# Patient Record
Sex: Female | Born: 1945 | Race: White | Hispanic: No | Smoking: Never smoker
Health system: Southern US, Community
[De-identification: ages and names within clinical notes are randomized; demographics above are authoritative.]

## PROBLEM LIST (undated history)

## (undated) DIAGNOSIS — I776 Arteritis, unspecified: Secondary | ICD-10-CM

## (undated) DIAGNOSIS — I10 Essential (primary) hypertension: Secondary | ICD-10-CM

## (undated) DIAGNOSIS — E079 Disorder of thyroid, unspecified: Secondary | ICD-10-CM

## (undated) DIAGNOSIS — I509 Heart failure, unspecified: Secondary | ICD-10-CM

## (undated) HISTORY — PX: TUBAL LIGATION: SHX77

## (undated) HISTORY — PX: MELANOMA EXCISION: SHX5266

---

## 1997-10-11 ENCOUNTER — Other Ambulatory Visit: Admission: RE | Admit: 1997-10-11 | Discharge: 1997-10-11 | Payer: Self-pay | Admitting: *Deleted

## 1997-11-19 ENCOUNTER — Other Ambulatory Visit: Admission: RE | Admit: 1997-11-19 | Discharge: 1997-11-19 | Payer: Self-pay | Admitting: Obstetrics & Gynecology

## 1998-01-16 ENCOUNTER — Emergency Department (HOSPITAL_COMMUNITY): Admission: EM | Admit: 1998-01-16 | Discharge: 1998-01-16 | Payer: Self-pay | Admitting: Emergency Medicine

## 1998-08-28 ENCOUNTER — Ambulatory Visit (HOSPITAL_COMMUNITY): Admission: RE | Admit: 1998-08-28 | Discharge: 1998-08-28 | Payer: Self-pay | Admitting: Oncology

## 1999-06-10 ENCOUNTER — Encounter (INDEPENDENT_AMBULATORY_CARE_PROVIDER_SITE_OTHER): Payer: Self-pay

## 1999-06-10 ENCOUNTER — Other Ambulatory Visit: Admission: RE | Admit: 1999-06-10 | Discharge: 1999-06-10 | Payer: Self-pay | Admitting: Obstetrics and Gynecology

## 1999-07-09 ENCOUNTER — Encounter: Payer: Self-pay | Admitting: Ophthalmology

## 1999-07-13 ENCOUNTER — Ambulatory Visit (HOSPITAL_COMMUNITY): Admission: RE | Admit: 1999-07-13 | Discharge: 1999-07-13 | Payer: Self-pay | Admitting: Ophthalmology

## 1999-08-28 ENCOUNTER — Ambulatory Visit (HOSPITAL_COMMUNITY): Admission: RE | Admit: 1999-08-28 | Discharge: 1999-08-28 | Payer: Self-pay | Admitting: Ophthalmology

## 2000-04-14 ENCOUNTER — Emergency Department (HOSPITAL_COMMUNITY): Admission: EM | Admit: 2000-04-14 | Discharge: 2000-04-14 | Payer: Self-pay | Admitting: Emergency Medicine

## 2000-05-13 ENCOUNTER — Encounter (INDEPENDENT_AMBULATORY_CARE_PROVIDER_SITE_OTHER): Payer: Self-pay

## 2000-05-13 ENCOUNTER — Other Ambulatory Visit: Admission: RE | Admit: 2000-05-13 | Discharge: 2000-05-13 | Payer: Self-pay | Admitting: Obstetrics and Gynecology

## 2001-06-21 ENCOUNTER — Other Ambulatory Visit: Admission: RE | Admit: 2001-06-21 | Discharge: 2001-06-21 | Payer: Self-pay | Admitting: Obstetrics and Gynecology

## 2002-04-04 ENCOUNTER — Encounter (INDEPENDENT_AMBULATORY_CARE_PROVIDER_SITE_OTHER): Payer: Self-pay | Admitting: *Deleted

## 2002-04-04 ENCOUNTER — Ambulatory Visit (HOSPITAL_COMMUNITY): Admission: RE | Admit: 2002-04-04 | Discharge: 2002-04-04 | Payer: Self-pay | Admitting: *Deleted

## 2002-09-27 ENCOUNTER — Other Ambulatory Visit: Admission: RE | Admit: 2002-09-27 | Discharge: 2002-09-27 | Payer: Self-pay | Admitting: Obstetrics and Gynecology

## 2004-12-24 ENCOUNTER — Other Ambulatory Visit: Admission: RE | Admit: 2004-12-24 | Discharge: 2004-12-24 | Payer: Self-pay | Admitting: Obstetrics and Gynecology

## 2007-01-02 ENCOUNTER — Ambulatory Visit: Payer: Self-pay | Admitting: Oncology

## 2007-01-19 LAB — CBC WITH DIFFERENTIAL/PLATELET
Basophils Absolute: 0 10*3/uL (ref 0.0–0.1)
Eosinophils Absolute: 0 10*3/uL (ref 0.0–0.5)
HGB: 12.4 g/dL (ref 11.6–15.9)
MCV: 82.7 fL (ref 81.0–101.0)
MONO#: 0.5 10*3/uL (ref 0.1–0.9)
MONO%: 7.8 % (ref 0.0–13.0)
NEUT#: 4.4 10*3/uL (ref 1.5–6.5)
RBC: 4.34 10*6/uL (ref 3.70–5.32)
RDW: 13.6 % (ref 11.3–14.5)
WBC: 6 10*3/uL (ref 3.9–10.0)
lymph#: 1.1 10*3/uL (ref 0.9–3.3)

## 2007-01-19 LAB — ERYTHROCYTE SEDIMENTATION RATE: Sed Rate: 76 mm/hr — ABNORMAL HIGH (ref 0–30)

## 2007-01-23 LAB — COMPREHENSIVE METABOLIC PANEL
ALT: 25 U/L (ref 0–35)
Albumin: 4.3 g/dL (ref 3.5–5.2)
CO2: 22 mEq/L (ref 19–32)
Glucose, Bld: 142 mg/dL — ABNORMAL HIGH (ref 70–99)
Potassium: 4 mEq/L (ref 3.5–5.3)
Sodium: 141 mEq/L (ref 135–145)
Total Bilirubin: 0.8 mg/dL (ref 0.3–1.2)
Total Protein: 6.9 g/dL (ref 6.0–8.3)

## 2007-01-23 LAB — BETA 2 MICROGLOBULIN, SERUM: Beta-2 Microglobulin: 1.53 mg/L (ref 1.01–1.73)

## 2007-01-23 LAB — IMMUNOFIXATION ELECTROPHORESIS
IgM, Serum: 742 mg/dL — ABNORMAL HIGH (ref 60–263)
Total Protein, Serum Electrophoresis: 6.9 g/dL (ref 6.0–8.3)

## 2007-02-15 ENCOUNTER — Ambulatory Visit: Payer: Self-pay | Admitting: Oncology

## 2007-02-23 LAB — CREATININE CLEARANCE, URINE, 24 HOUR
Collection Interval-CRCL: 24 hours
Creatinine Clearance: 106 mL/min (ref 75–115)
Creatinine: 0.72 mg/dL (ref 0.40–1.20)
Urine Total Volume-CRCL: 1900 mL

## 2007-02-23 LAB — UIFE/LIGHT CHAINS/TP QN, 24-HR UR
Albumin, U: DETECTED
Free Lt Chn Excr Rate: 12.92 mg/d
Total Protein, Urine-Ur/day: 13 mg/d (ref 10–140)
Total Protein, Urine: 0.7 mg/dL

## 2007-03-28 LAB — CBC WITH DIFFERENTIAL/PLATELET
BASO%: 0 % (ref 0.0–2.0)
EOS%: 0.3 % (ref 0.0–7.0)
Eosinophils Absolute: 0 10*3/uL (ref 0.0–0.5)
LYMPH%: 31 % (ref 14.0–48.0)
MCH: 27.5 pg (ref 26.0–34.0)
MCHC: 34.3 g/dL (ref 32.0–36.0)
MCV: 80.2 fL — ABNORMAL LOW (ref 81.0–101.0)
MONO%: 7.8 % (ref 0.0–13.0)
Platelets: 217 10*3/uL (ref 145–400)
RBC: 4.52 10*6/uL (ref 3.70–5.32)

## 2007-03-28 LAB — LACTATE DEHYDROGENASE: LDH: 143 U/L (ref 94–250)

## 2007-03-28 LAB — COMPREHENSIVE METABOLIC PANEL
ALT: 48 U/L — ABNORMAL HIGH (ref 0–35)
CO2: 25 mEq/L (ref 19–32)
Creatinine, Ser: 0.66 mg/dL (ref 0.40–1.20)
Total Bilirubin: 0.7 mg/dL (ref 0.3–1.2)

## 2007-03-28 LAB — ERYTHROCYTE SEDIMENTATION RATE: Sed Rate: 50 mm/hr (ref 0–30)

## 2007-03-28 LAB — IGG, IGA, IGM
IgG (Immunoglobin G), Serum: 288 mg/dL — ABNORMAL LOW (ref 694–1618)
IgM, Serum: 1100 mg/dL — ABNORMAL HIGH (ref 60–263)

## 2007-04-22 ENCOUNTER — Emergency Department (HOSPITAL_COMMUNITY): Admission: EM | Admit: 2007-04-22 | Discharge: 2007-04-22 | Payer: Self-pay | Admitting: Emergency Medicine

## 2007-10-02 ENCOUNTER — Ambulatory Visit: Payer: Self-pay | Admitting: Oncology

## 2007-10-05 LAB — CBC WITH DIFFERENTIAL/PLATELET
BASO%: 0.6 % (ref 0.0–2.0)
EOS%: 1.1 % (ref 0.0–7.0)
HCT: 38.2 % (ref 34.8–46.6)
MCH: 27.8 pg (ref 26.0–34.0)
MCHC: 33.9 g/dL (ref 32.0–36.0)
MCV: 81.9 fL (ref 81.0–101.0)
MONO%: 10.1 % (ref 0.0–13.0)
NEUT%: 61.8 % (ref 39.6–76.8)
RDW: 13.6 % (ref 11.3–14.5)
lymph#: 1.3 10*3/uL (ref 0.9–3.3)

## 2007-10-05 LAB — IGG, IGA, IGM
IgA: 122 mg/dL (ref 68–378)
IgG (Immunoglobin G), Serum: 305 mg/dL — ABNORMAL LOW (ref 694–1618)
IgM, Serum: 759 mg/dL — ABNORMAL HIGH (ref 60–263)

## 2007-10-05 LAB — COMPREHENSIVE METABOLIC PANEL
AST: 17 U/L (ref 0–37)
Albumin: 4.2 g/dL (ref 3.5–5.2)
Alkaline Phosphatase: 68 U/L (ref 39–117)
BUN: 11 mg/dL (ref 6–23)
Glucose, Bld: 203 mg/dL — ABNORMAL HIGH (ref 70–99)
Potassium: 4.1 mEq/L (ref 3.5–5.3)
Total Bilirubin: 0.7 mg/dL (ref 0.3–1.2)

## 2008-04-03 ENCOUNTER — Ambulatory Visit: Payer: Self-pay | Admitting: Oncology

## 2008-10-03 ENCOUNTER — Ambulatory Visit: Payer: Self-pay | Admitting: Oncology

## 2008-10-08 LAB — CBC WITH DIFFERENTIAL/PLATELET
Basophils Absolute: 0 10*3/uL (ref 0.0–0.1)
Eosinophils Absolute: 0 10*3/uL (ref 0.0–0.5)
HCT: 37.1 % (ref 34.8–46.6)
HGB: 12.6 g/dL (ref 11.6–15.9)
MONO#: 0.3 10*3/uL (ref 0.1–0.9)
NEUT#: 2.9 10*3/uL (ref 1.5–6.5)
NEUT%: 65.4 % (ref 38.4–76.8)
WBC: 4.4 10*3/uL (ref 3.9–10.3)
lymph#: 1.1 10*3/uL (ref 0.9–3.3)

## 2008-10-09 LAB — IGG, IGA, IGM
IgA: 84 mg/dL (ref 68–378)
IgM, Serum: 885 mg/dL — ABNORMAL HIGH (ref 60–263)

## 2008-10-09 LAB — COMPREHENSIVE METABOLIC PANEL
Albumin: 4.3 g/dL (ref 3.5–5.2)
BUN: 14 mg/dL (ref 6–23)
Calcium: 9.5 mg/dL (ref 8.4–10.5)
Chloride: 105 mEq/L (ref 96–112)
Glucose, Bld: 147 mg/dL — ABNORMAL HIGH (ref 70–99)
Potassium: 4.2 mEq/L (ref 3.5–5.3)
Total Protein: 6.5 g/dL (ref 6.0–8.3)

## 2008-12-10 ENCOUNTER — Emergency Department (HOSPITAL_COMMUNITY): Admission: EM | Admit: 2008-12-10 | Discharge: 2008-12-11 | Payer: Self-pay | Admitting: Emergency Medicine

## 2009-04-08 ENCOUNTER — Ambulatory Visit: Payer: Self-pay | Admitting: Oncology

## 2009-04-10 LAB — CBC WITH DIFFERENTIAL/PLATELET
BASO%: 0.2 % (ref 0.0–2.0)
Basophils Absolute: 0 10*3/uL (ref 0.0–0.1)
EOS%: 0.7 % (ref 0.0–7.0)
Eosinophils Absolute: 0 10*3/uL (ref 0.0–0.5)
HCT: 37.6 % (ref 34.8–46.6)
HGB: 12.7 g/dL (ref 11.6–15.9)
LYMPH%: 24.2 % (ref 14.0–49.7)
MCH: 27.8 pg (ref 25.1–34.0)
MCHC: 33.8 g/dL (ref 31.5–36.0)
MCV: 82.2 fL (ref 79.5–101.0)
MONO#: 0.4 10*3/uL (ref 0.1–0.9)
MONO%: 7.9 % (ref 0.0–14.0)
NEUT#: 3.3 10*3/uL (ref 1.5–6.5)
NEUT%: 67 % (ref 38.4–76.8)
Platelets: 197 10*3/uL (ref 145–400)
RBC: 4.57 10*6/uL (ref 3.70–5.45)
RDW: 15.1 % — ABNORMAL HIGH (ref 11.2–14.5)
WBC: 4.9 10*3/uL (ref 3.9–10.3)
lymph#: 1.2 10*3/uL (ref 0.9–3.3)

## 2009-04-10 LAB — LACTATE DEHYDROGENASE: LDH: 126 U/L (ref 94–250)

## 2009-04-10 LAB — IGG, IGA, IGM
IgA: 80 mg/dL (ref 68–378)
IgG (Immunoglobin G), Serum: 301 mg/dL — ABNORMAL LOW (ref 694–1618)
IgM, Serum: 864 mg/dL — ABNORMAL HIGH (ref 60–263)

## 2009-04-10 LAB — COMPREHENSIVE METABOLIC PANEL
ALT: 30 U/L (ref 0–35)
AST: 24 U/L (ref 0–37)
Albumin: 4.3 g/dL (ref 3.5–5.2)
Alkaline Phosphatase: 66 U/L (ref 39–117)
BUN: 11 mg/dL (ref 6–23)
CO2: 26 mEq/L (ref 19–32)
Calcium: 9.9 mg/dL (ref 8.4–10.5)
Chloride: 104 mEq/L (ref 96–112)
Creatinine, Ser: 0.59 mg/dL (ref 0.40–1.20)
Glucose, Bld: 140 mg/dL — ABNORMAL HIGH (ref 70–99)
Potassium: 4.1 mEq/L (ref 3.5–5.3)
Sodium: 139 mEq/L (ref 135–145)
Total Bilirubin: 0.5 mg/dL (ref 0.3–1.2)
Total Protein: 6.7 g/dL (ref 6.0–8.3)

## 2009-10-07 ENCOUNTER — Ambulatory Visit: Payer: Self-pay | Admitting: Oncology

## 2009-10-09 LAB — CBC WITH DIFFERENTIAL/PLATELET
Basophils Absolute: 0 10*3/uL (ref 0.0–0.1)
EOS%: 0.6 % (ref 0.0–7.0)
HCT: 34.7 % — ABNORMAL LOW (ref 34.8–46.6)
HGB: 11.9 g/dL (ref 11.6–15.9)
LYMPH%: 23.3 % (ref 14.0–49.7)
MCH: 27.1 pg (ref 25.1–34.0)
MCV: 78.7 fL — ABNORMAL LOW (ref 79.5–101.0)
MONO%: 7.2 % (ref 0.0–14.0)
NEUT%: 68.7 % (ref 38.4–76.8)
Platelets: 166 10*3/uL (ref 145–400)

## 2009-10-10 LAB — COMPREHENSIVE METABOLIC PANEL
Albumin: 4 g/dL (ref 3.5–5.2)
Alkaline Phosphatase: 58 U/L (ref 39–117)
BUN: 8 mg/dL (ref 6–23)
Creatinine, Ser: 0.66 mg/dL (ref 0.40–1.20)
Glucose, Bld: 238 mg/dL — ABNORMAL HIGH (ref 70–99)
Potassium: 4.2 mEq/L (ref 3.5–5.3)
Total Bilirubin: 0.7 mg/dL (ref 0.3–1.2)

## 2009-10-10 LAB — IGG, IGA, IGM
IgA: 197 mg/dL (ref 68–378)
IgM, Serum: 1280 mg/dL — ABNORMAL HIGH (ref 60–263)

## 2010-04-09 ENCOUNTER — Ambulatory Visit: Payer: Self-pay | Admitting: Oncology

## 2010-04-13 LAB — CBC WITH DIFFERENTIAL/PLATELET
BASO%: 0.3 % (ref 0.0–2.0)
Basophils Absolute: 0 10*3/uL (ref 0.0–0.1)
EOS%: 0.4 % (ref 0.0–7.0)
Eosinophils Absolute: 0 10*3/uL (ref 0.0–0.5)
HCT: 39.4 % (ref 34.8–46.6)
HGB: 13.5 g/dL (ref 11.6–15.9)
LYMPH%: 24 % (ref 14.0–49.7)
MCH: 28.5 pg (ref 25.1–34.0)
MCHC: 34.2 g/dL (ref 31.5–36.0)
MCV: 83.3 fL (ref 79.5–101.0)
MONO#: 0.4 10*3/uL (ref 0.1–0.9)
MONO%: 6.3 % (ref 0.0–14.0)
NEUT#: 3.8 10*3/uL (ref 1.5–6.5)
NEUT%: 69 % (ref 38.4–76.8)
Platelets: 185 10*3/uL (ref 145–400)
RBC: 4.73 10*6/uL (ref 3.70–5.45)
RDW: 14.1 % (ref 11.2–14.5)
WBC: 5.6 10*3/uL (ref 3.9–10.3)
lymph#: 1.3 10*3/uL (ref 0.9–3.3)

## 2010-04-13 LAB — COMPREHENSIVE METABOLIC PANEL
ALT: 26 U/L (ref 0–35)
AST: 22 U/L (ref 0–37)
Albumin: 4.4 g/dL (ref 3.5–5.2)
Alkaline Phosphatase: 66 U/L (ref 39–117)
BUN: 16 mg/dL (ref 6–23)
CO2: 25 mEq/L (ref 19–32)
Calcium: 9.9 mg/dL (ref 8.4–10.5)
Chloride: 102 mEq/L (ref 96–112)
Creatinine, Ser: 0.65 mg/dL (ref 0.40–1.20)
Glucose, Bld: 172 mg/dL — ABNORMAL HIGH (ref 70–99)
Potassium: 4.3 mEq/L (ref 3.5–5.3)
Sodium: 139 mEq/L (ref 135–145)
Total Bilirubin: 0.6 mg/dL (ref 0.3–1.2)
Total Protein: 6.7 g/dL (ref 6.0–8.3)

## 2010-04-13 LAB — LACTATE DEHYDROGENASE: LDH: 127 U/L (ref 94–250)

## 2010-06-19 LAB — DIFFERENTIAL
Basophils Relative: 0 % (ref 0–1)
Lymphocytes Relative: 19 % (ref 12–46)
Lymphs Abs: 1.3 10*3/uL (ref 0.7–4.0)
Monocytes Absolute: 0.4 10*3/uL (ref 0.1–1.0)
Monocytes Relative: 6 % (ref 3–12)
Neutro Abs: 4.9 10*3/uL (ref 1.7–7.7)
Neutrophils Relative %: 74 % (ref 43–77)

## 2010-06-19 LAB — URINALYSIS, ROUTINE W REFLEX MICROSCOPIC
Bilirubin Urine: NEGATIVE
Nitrite: NEGATIVE
Specific Gravity, Urine: 1.014 (ref 1.005–1.030)
Urobilinogen, UA: 0.2 mg/dL (ref 0.0–1.0)

## 2010-06-19 LAB — CBC
MCHC: 33.6 g/dL (ref 30.0–36.0)
RBC: 4.35 MIL/uL (ref 3.87–5.11)
RDW: 13.9 % (ref 11.5–15.5)

## 2010-06-19 LAB — COMPREHENSIVE METABOLIC PANEL
Albumin: 3.7 g/dL (ref 3.5–5.2)
BUN: 7 mg/dL (ref 6–23)
Calcium: 9.6 mg/dL (ref 8.4–10.5)
Creatinine, Ser: 0.64 mg/dL (ref 0.4–1.2)
Glucose, Bld: 136 mg/dL — ABNORMAL HIGH (ref 70–99)
Potassium: 3.7 mEq/L (ref 3.5–5.1)
Total Protein: 7 g/dL (ref 6.0–8.3)

## 2010-07-31 NOTE — Op Note (Signed)
   NAME:  Emily Kennedy, Emily Kennedy                       ACCOUNT NO.:  [id number]   MEDICAL RECORD NO.:  [id number]                   PATIENT TYPE:  AMB   LOCATION:  ENDO                                 FACILITY:  [facility]   PHYSICIAN:  Georgiana Spinner, M.D.                 DATE OF BIRTH:  10-27-1945   DATE OF PROCEDURE:  04/04/2002  DATE OF DISCHARGE:                                 OPERATIVE REPORT   PROCEDURE:  Upper endoscopy.   INDICATIONS:  GERD.   ANESTHESIA:  Demerol 75, Versed 7 mg.   DESCRIPTION OF PROCEDURE:  With patient mildly sedated in the left lateral  decubitus position, the Olympus videoscopic endoscope was inserted in the  mouth and passed under direct vision through the esophagus, which appeared  normal, into the stomach.  Fundus, body, antrum all appeared somewhat  edematous, and this was photographed and biopsied.  The duodenal bulb and  second portion of duodenum appeared normal.  From this point, the endoscope  was slowly withdrawn, taking circumferential views of the duodenal mucosa  until the endoscope then pulled back into the stomach, placed in  retroflexion to view the stomach from below.  The endoscope was straightened  and withdrawn, taking circumferential views of the remaining gastric and  esophageal mucosa, as stated, stopping to biopsy various areas.  The  patient's vital signs and pulse oximeter remained stable.  The patient  tolerated the procedure well without apparent complications.   FINDINGS:  Changes of gastritis, biopsied.   PLAN:  1. Await biopsy report.  2. The patient will call me for results and follow up with me as an     outpatient.  3. Proceed to colonoscopy as planned.                                               Georgiana Spinner, M.D.    GMO/MEDQ  D:  04/04/2002  T:  04/04/2002  Job:  [id number]

## 2010-10-12 ENCOUNTER — Encounter (HOSPITAL_BASED_OUTPATIENT_CLINIC_OR_DEPARTMENT_OTHER): Payer: Medicare Other | Admitting: Oncology

## 2010-10-12 ENCOUNTER — Other Ambulatory Visit (HOSPITAL_COMMUNITY): Payer: Self-pay | Admitting: Oncology

## 2010-10-12 DIAGNOSIS — D472 Monoclonal gammopathy: Secondary | ICD-10-CM

## 2010-10-12 LAB — CBC WITH DIFFERENTIAL/PLATELET
Basophils Absolute: 0 10*3/uL (ref 0.0–0.1)
Eosinophils Absolute: 0 10*3/uL (ref 0.0–0.5)
HGB: 13.5 g/dL (ref 11.6–15.9)
LYMPH%: 22.6 % (ref 14.0–49.7)
MCV: 85.3 fL (ref 79.5–101.0)
MONO#: 0.4 10*3/uL (ref 0.1–0.9)
MONO%: 7.1 % (ref 0.0–14.0)
NEUT#: 3.9 10*3/uL (ref 1.5–6.5)
Platelets: 165 10*3/uL (ref 145–400)
RBC: 4.53 10*6/uL (ref 3.70–5.45)
WBC: 5.7 10*3/uL (ref 3.9–10.3)

## 2010-10-13 LAB — COMPREHENSIVE METABOLIC PANEL
AST: 23 U/L (ref 0–37)
Albumin: 4.2 g/dL (ref 3.5–5.2)
Alkaline Phosphatase: 61 U/L (ref 39–117)
BUN: 15 mg/dL (ref 6–23)
Potassium: 4.3 mEq/L (ref 3.5–5.3)

## 2010-10-13 LAB — IGG, IGA, IGM: IgM, Serum: 885 mg/dL — ABNORMAL HIGH (ref 52–322)

## 2011-01-06 ENCOUNTER — Emergency Department (HOSPITAL_COMMUNITY): Payer: Medicare Other

## 2011-01-06 ENCOUNTER — Inpatient Hospital Stay (HOSPITAL_COMMUNITY)
Admission: EM | Admit: 2011-01-06 | Discharge: 2011-01-08 | DRG: 689 | Disposition: A | Discharge status: Home / Self Care | Payer: Medicare Other | Attending: Internal Medicine | Admitting: Internal Medicine

## 2011-01-06 DIAGNOSIS — Z7982 Long term (current) use of aspirin: Secondary | ICD-10-CM

## 2011-01-06 DIAGNOSIS — E119 Type 2 diabetes mellitus without complications: Secondary | ICD-10-CM | POA: Diagnosis present

## 2011-01-06 DIAGNOSIS — R000 Tachycardia, unspecified: Secondary | ICD-10-CM | POA: Diagnosis present

## 2011-01-06 DIAGNOSIS — A419 Sepsis, unspecified organism: Secondary | ICD-10-CM | POA: Diagnosis present

## 2011-01-06 DIAGNOSIS — E871 Hypo-osmolality and hyponatremia: Secondary | ICD-10-CM | POA: Diagnosis present

## 2011-01-06 DIAGNOSIS — Z85820 Personal history of malignant melanoma of skin: Secondary | ICD-10-CM

## 2011-01-06 DIAGNOSIS — Z79899 Other long term (current) drug therapy: Secondary | ICD-10-CM

## 2011-01-06 DIAGNOSIS — D61818 Other pancytopenia: Secondary | ICD-10-CM | POA: Diagnosis present

## 2011-01-06 DIAGNOSIS — E039 Hypothyroidism, unspecified: Secondary | ICD-10-CM | POA: Diagnosis present

## 2011-01-06 DIAGNOSIS — R21 Rash and other nonspecific skin eruption: Secondary | ICD-10-CM | POA: Diagnosis present

## 2011-01-06 DIAGNOSIS — N390 Urinary tract infection, site not specified: Principal | ICD-10-CM | POA: Diagnosis present

## 2011-01-06 LAB — COMPREHENSIVE METABOLIC PANEL
ALT: 35 U/L (ref 0–35)
AST: 21 U/L (ref 0–37)
Alkaline Phosphatase: 73 U/L (ref 39–117)
CO2: 23 mEq/L (ref 19–32)
GFR calc Af Amer: >90 mL/min (ref >90)
GFR calc non Af Amer: >90 mL/min (ref >90)
Glucose, Bld: 227 mg/dL — ABNORMAL HIGH (ref 70–99)
Potassium: 3.7 mEq/L (ref 3.5–5.1)
Sodium: 130 mEq/L — ABNORMAL LOW (ref 135–145)
Total Protein: 6.5 g/dL (ref 6.0–8.3)

## 2011-01-06 LAB — DIFFERENTIAL
Eosinophils Absolute: 0.1 10*3/uL (ref 0.0–0.7)
Eosinophils Relative: 2 % (ref 0–5)
Lymphs Abs: 0.4 10*3/uL — ABNORMAL LOW (ref 0.7–4.0)
Monocytes Absolute: 0.4 10*3/uL (ref 0.1–1.0)
Monocytes Relative: 6 % (ref 3–12)

## 2011-01-06 LAB — CBC
MCH: 29.1 pg (ref 26.0–34.0)
MCHC: 34.6 g/dL (ref 30.0–36.0)
MCV: 84 fL (ref 78.0–100.0)
Platelets: 128 10*3/uL — ABNORMAL LOW (ref 150–400)
RDW: 12.9 % (ref 11.5–15.5)

## 2011-01-06 LAB — URINALYSIS, ROUTINE W REFLEX MICROSCOPIC
Bilirubin Urine: NEGATIVE
Ketones, ur: 15 mg/dL — AB
Nitrite: NEGATIVE
Urobilinogen, UA: 1 mg/dL (ref 0.0–1.0)

## 2011-01-07 LAB — URINE CULTURE
Colony Count: NO GROWTH
Culture  Setup Time: 201210250143
Culture: NO GROWTH

## 2011-01-07 LAB — CBC
MCHC: 33.8 g/dL (ref 30.0–36.0)
MCV: 85.5 fL (ref 78.0–100.0)
Platelets: 123 10*3/uL — ABNORMAL LOW (ref 150–400)
RDW: 13.1 % (ref 11.5–15.5)
WBC: 5.7 10*3/uL (ref 4.0–10.5)

## 2011-01-07 LAB — BASIC METABOLIC PANEL
BUN: 7 mg/dL (ref 6–23)
Calcium: 9.1 mg/dL (ref 8.4–10.5)
Chloride: 100 mEq/L (ref 96–112)
Creatinine, Ser: 0.55 mg/dL (ref 0.50–1.10)
GFR calc Af Amer: >90 mL/min (ref >90)
GFR calc non Af Amer: >90 mL/min (ref >90)

## 2011-01-07 LAB — DIFFERENTIAL
Eosinophils Absolute: 0.1 10*3/uL (ref 0.0–0.7)
Eosinophils Relative: 2 % (ref 0–5)
Lymphs Abs: 0.4 10*3/uL — ABNORMAL LOW (ref 0.7–4.0)
Monocytes Absolute: 0.5 10*3/uL (ref 0.1–1.0)

## 2011-01-07 LAB — GLUCOSE, CAPILLARY: Glucose-Capillary: 238 mg/dL — ABNORMAL HIGH (ref 70–99)

## 2011-01-08 LAB — BASIC METABOLIC PANEL
Calcium: 8.6 mg/dL (ref 8.4–10.5)
GFR calc Af Amer: >90 mL/min (ref >90)
GFR calc non Af Amer: >90 mL/min (ref >90)
Glucose, Bld: 196 mg/dL — ABNORMAL HIGH (ref 70–99)
Potassium: 3.3 mEq/L — ABNORMAL LOW (ref 3.5–5.1)
Sodium: 133 mEq/L — ABNORMAL LOW (ref 135–145)

## 2011-01-08 LAB — CBC
MCH: 28.8 pg (ref 26.0–34.0)
MCHC: 34 g/dL (ref 30.0–36.0)
RDW: 13.1 % (ref 11.5–15.5)

## 2011-01-08 NOTE — H&P (Signed)
Emily Kennedy, MAUND NO.:  [id number]  MEDICAL RECORD NO.:  [id number]  LOCATION:  [facility]                         FACILITY:  [facility]  PHYSICIAN:  Brendia Sacks, MD    DATE OF BIRTH:  04-Sep-1945  DATE OF ADMISSION:  01/06/2011 DATE OF DISCHARGE:                             HISTORY & PHYSICAL   PRIMARY CARE PHYSICIAN:  Emily Patch, MD  REFERRING PHYSICIAN:  [name], MD, in the emergency room.  PRIMARY ONCOLOGIST:  Emily Dada, MD  CHIEF COMPLAINT:  "Felt poorly."  HISTORY OF PRESENT ILLNESS:  This is a 65 year old woman who presented to the emergency room today feeling poorly.  She reports she has felt poorly over the last several months.  She had cold symptoms earlier in the month and then felt better.  For the last 3 days, she has again felt poorly.  She was seen at her primary care physician's office on January 04, 2011, with dysuria and urge to void and was diagnosed with a urinary tract infection.  On Tuesday, she was started on Keflex.  She had had some nausea with this and vomiting by report.  Today, she felt poorly. She took her pulse and found herself to be tachycardic and she has had fever and chills today.  In the emergency room, she was found to have a urinary tract infection and was treated with Zosyn.  There was a rash on her chest which Dr. [name] notated prior to administration of any antibiotics.  The patient was unaware of it.  She was found to be tachycardic but not hypotensive and was given 1 liter of IV fluids and a dose of Zosyn.  She tolerated the Zosyn.  She does note a fever of 100.6 at home and some myalgias.  REVIEW OF SYSTEMS:  Negative for changes to her vision, sore throat, rash, chest pain.  She has had a cough for approximately 2 months and at times she has apparently been short of breath.  No nausea, vomiting, abdominal pain, diarrhea, or bleeding.  PAST MEDICAL HISTORY: 1. Diabetes mellitus. 2.  Hypothyroidism. 3. Vasculitis.  The patient cannot provide further history, but she     has been seen by a rheumatologist in the past, although not for     some time. 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  PAST SURGICAL HISTORY: 1. BTL. 2. Melanoma resection from her face.  SOCIAL HISTORY:  She is a nonsmoker and nondrinker.  ALLERGIES: 1. CECLOR which causes rash, but not anaphylaxis. 2. CIPRO causes rash. 3. SEPTRA and SULFA cause a rash. 4. MACROBID causes GI upset.  MEDICATIONS: 1. Aspirin 81 mg p.o. daily. 2. Glipizide 5 mg p.o. b.i.d. 3. Metformin 1 g p.o. daily. 4. Keflex. 5. Synthroid 75 mcg 1 tablet p.o. daily except for 2 on Sunday. 6. Glucosamine. 7. Chondroitin.  PHYSICAL EXAMINATION:  VITAL SIGNS:  Initial blood pressure 142/79, pulse 132, respirations 20, temperature 100.3.  On most recent vital signs, temperature 98.8, blood pressure 118/67, and pulse of 103 with respiratory rate of 18. GENERAL:  Patient was examined in the emergency room and is lying on a stretcher sitting up.  She appears to be calm and comfortable. HEENT:  Head is normal.  Eyes, sclerae are clear.  Pupils, the right pupil is round and reactive to light.  Left pupil is eccentric secondary to previous cataract extraction.  She has upper dentures.  Lips and tongue appear unremarkable.  Hearing is grossly normal. NECK:  Supple.  No lymphadenopathy or masses.  No thyromegaly. CHEST:  Clear to auscultation bilaterally.  No wheezes, rales, or rhonchi.  There is normal respiratory effort.  No lower extremity edema. CARDIOVASCULAR:  Regular rate and rhythm.  No murmur, rub, or gallop. ABDOMEN:  Soft, nontender, nondistended.  No masses are appreciated. SKIN:  She does have a macular rash over the upper portion of her chest and over her shoulder blades.  It is nontender and nonpruritic. PSYCHIATRIC:  Grossly normal mood and affect.  Speech is fluent and appropriate.  She is a  vague historian.  IMAGING:  Chest x-ray on January 06, 2011: Mild left basilar atelectasis noted.  Lungs, otherwise, clear.  Note that the clinical data includes chest pain; however, the patient specifically denied chest pain to me.  ANCILLARY DATA:  EKG shows sinus tachycardia with no acute changes seen.  PERTINENT LABORATORY STUDIES: 1. CBC is notable for a platelet count of 128.  I note that she once     had a platelet count of 116 back in 2001 with one interval data     point within normal limits on December 10, 2008. 2. Basic metabolic panel is notable for sodium of 130. 3. Hepatic function panel is unremarkable. 4. Urinalysis is grossly positive.  ASSESSMENT AND PLAN:  This is a 65 year old woman who presents with a urinary tract infection and possible early sepsis. 1. Urinary tract infection with possible early sepsis.  Given her     tachycardia and low-grade temp, I suspect sepsis is developing.     She has been treated with Zosyn here which she tolerated in the     emergency room.  Note the rash was already present and may have     been related to Keflex.  She does note, however, is that she has     tolerated Keflex in the past.  Her allergies are somewhat limiting,     but actually she has tolerated Zosyn and she has never had problems     with penicillin as far she is aware.  We will continue this at this     time.  Urine culture and blood cultures have already been obtained. 2. Thrombocytopenia.  Etiology and significance unclear.  We will     repeat a CBC in the morning.  We will hold pharmacologic DVT     prophylaxis until the platelet counts seem to be stable.  We will     place on SCDs. 3. Diabetes mellitus type 2.  We will place her on sliding scale     insulin at this point and continue her on glipizide.  Metformin on     hold during this hospitalization. 4. The patient is full code.     Brendia Sacks, MD     DG/MEDQ  D:  01/07/2011  T:  01/07/2011   Job:  [id number]  cc:   Emily Kennedy, M.D. Emily Kennedy, M.D.  Electronically Signed by Brendia Sacks  on 01/08/2011 03:11:17 PM

## 2011-01-13 LAB — CULTURE, BLOOD (ROUTINE X 2)
Culture  Setup Time: 201210250340
Culture: NO GROWTH

## 2011-01-16 NOTE — Discharge Summary (Signed)
Emily Kennedy                ACCOUNT NO.:  [id number]  MEDICAL RECORD NO.:  [id number]  LOCATION:  5529                         FACILITY:  [facility]  PHYSICIAN:  Peggye Pitt, M.D. DATE OF BIRTH:  April 21, 1945  DATE OF ADMISSION:  01/06/2011 DATE OF DISCHARGE:  01/08/2011                              DISCHARGE SUMMARY   PRIMARY CARE PHYSICIAN:  [name], M.D.  PRIMARY ONCOLOGIST/HEMATOLOGIST:  [name], M.D.  DISCHARGE DIAGNOSES: 1. Probable early sepsis. 2. Urinary tract infection. 3. Tachycardia. 4. Diabetes mellitus, with hemoglobin A1c of 6.3. 5. Mild hyponatremia. 6. History of high-protein. 7. History of vasculitis. 8. History of hypothyroidism. 9. Pancytopenia.  HISTORY AND BRIEF HOSPITAL COURSE:  This is a 65 year old female who presented to the emergency room on October 24 feeling very poorly.  She described weakness over the last several months and particularly over the last 3-4 weeks.  She describes congestion in her chest.  She was seen at her primary care physician's office on October 22 and was diagnosed with the urinary tract infection.  She was started on Keflex.  She had some nausea and vomiting and continued to feel poorly.  When she came to the emergency department, she was very tachycardic, [facility] were called for further evaluation and workup.  The patient was admitted to a regular floor.  She was started on IV fluids and IV Zosyn for suspected sepsis from a urinary tract infection.  She was also noted in the ED to have thrombocytopenia consequently DVT prophylaxis was held. Over the next 24 hours, the patient improved tremendously.  When I saw her for the first time in the morning of October 25, she told me that she felt much better.  She was ambulating to bathroom and appeared well; however, she was still tachycardic in the 115-120 range and because her urine cultures and serologies had not returned yet, it was felt  prudent to keep her overnight.  Today, January 08, 2011, the patient looks well and she feels well.  Her tachycardia is slowly resolving.  Her pulse rate is in the 90s and low 100 now.  She did have an episode of vomiting last night, but no diarrhea and feels much improved.  I believe this is mostly due to IV fluids, but she has had two and half days of Zosyn as well.  She will be given a single dose of 3 g of fosfomycin this afternoon in order to complete treatment for urinary tract infection, and then because she is feeling so well, she will be discharged to home. This patient, however, has multiple drug allergies including sulfa, TMP, Cipro, cefaclor and Macrobid. Consequently, we would like to keep her on a tele monitor for 2-3 hours after taking the fosfomycin to ensure that she is stable for discharge.  PHYSICAL EXAMINATION:  GENERAL:  At time of discharge, the patient is alert, oriented, demeanor.  She is pleasant, cooperative. VITAL SIGNS:  Temperature is 98.4, pulse 100, respirations 18, blood pressure 115/68, O2 sats 94% on room air. HEAD:  Atraumatic, normocephalic. EYES:  Anicteric with pupils are equal and round. NOSE:  No nasal discharge or exterior lesions. MOUTH:  Moist  mucous membranes with good dentition. NECK:  Supple with midline trachea.  No JVD.  No lymphadenopathy. CHEST:  Demonstrates no accessory muscle use.  She has no wheezes or crackles to my exam. HEART:  Irregular rate and irregular rhythm without obvious murmurs, rubs, or gallops. ABDOMEN:  Soft, nontender, nondistended with good bowel sounds. EXTREMITIES:  No clubbing, cyanosis or edema. NEURO:  Cranial nerves II through XII appear grossly intact.  She has no facial asymmetries.  No obvious focal neuro deficits.  PERTINENT RADIOLOGICAL EXAMS DURING THIS HOSPITALIZATION:  There was a question as to whether or not the patient might have pneumonia that could have contributed to her weakness.   Consequently, on October 24, a chest x-ray was done.  She had mild left basilar atelectasis noted; otherwise, her lungs were clear.  There was no sign of pneumonia, pleural effusion, or pneumothorax.  PERTINENT LABS:  On January 08, 2011, the patient's CBC is as follows white count 3.7, hemoglobin 11.6, hematocrit 34.1, platelets 128.  BMET shows sodium 133, potassium 3.3 which will be repleted before she leaves, chloride 100, bicarb 24, glucose 96, BUN 5, creatinine 0.54, calcium 8.6, hemoglobin A1c 6.38.  Urinary analysis at the time of admission shows 21-50 white blood cells with large leukocytes, appears to be positive for infection; however, urine culture shows no growth. Blood cultures are still under a preliminary status, but show no growth to date.  DISCHARGE MEDICATIONS: 1. Aspirin 81 mg 1 tab by mouth daily. 2. Glipizide 5 mg 1 tab by mouth twice daily. 3. Glucosamine 500 mg 1 tab by mouth 3 times daily. 4. Metformin 1000 mg 1 tab by mouth daily. 5. Synthroid 75 mcg 1-2 tablets by mouth daily, take 2 tablets on     Sunday and 1 tab every other day.  BRIEF DISCHARGE INSTRUCTIONS:  The patient will be discharged to home. She lives with her daughter.  Today, on October 26, she is in stable condition after she has taken her fosfomycin.  Activity level is as tolerated.  Diet is small meals with bland diet until feeling completely better.  She should adhere to a diabetic-carb-modified diet.  FOLLOW UP APPOINTMENTS:  The patient is to see Dr. [name] for hospital follow up.  I called his office this afternoon and it is closed on Friday afternoon, so I have asked the patient to call and please be seen within 1 week.  I feel it is important that the patient has BMET, TSH, and CBC within the next week in order to evaluate her serum potassium, her pancytopenia, her type 2 diabetes, and her thyroid.  Given her condition at the time of follow up, Dr. Ricki Miller may think that it is  important for the patient to revisit with Dr. Arline Asp.     Stephani Police, PA   ______________________________ Peggye Pitt, M.D.    MLY/MEDQ  D:  01/08/2011  T:  01/08/2011  Job:  [id number]  cc:   [name], M.D. [name], M.D.  Electronically Signed by Algis Downs PA on 01/13/2011 12:27:37 PM Electronically Signed by Peggye Pitt M.D. on 01/16/2011 10:15:32 PM

## 2011-01-22 ENCOUNTER — Emergency Department (HOSPITAL_COMMUNITY)
Admission: EM | Admit: 2011-01-22 | Discharge: 2011-01-22 | Disposition: A | Discharge status: Home / Self Care | Payer: Medicare Other | Attending: Emergency Medicine | Admitting: Emergency Medicine

## 2011-01-22 ENCOUNTER — Encounter: Payer: Self-pay | Admitting: *Deleted

## 2011-01-22 DIAGNOSIS — R21 Rash and other nonspecific skin eruption: Secondary | ICD-10-CM | POA: Insufficient documentation

## 2011-01-22 DIAGNOSIS — Z79899 Other long term (current) drug therapy: Secondary | ICD-10-CM | POA: Insufficient documentation

## 2011-01-22 DIAGNOSIS — M25579 Pain in unspecified ankle and joints of unspecified foot: Secondary | ICD-10-CM | POA: Insufficient documentation

## 2011-01-22 DIAGNOSIS — Z7982 Long term (current) use of aspirin: Secondary | ICD-10-CM | POA: Insufficient documentation

## 2011-01-22 DIAGNOSIS — L988 Other specified disorders of the skin and subcutaneous tissue: Secondary | ICD-10-CM | POA: Insufficient documentation

## 2011-01-22 DIAGNOSIS — E119 Type 2 diabetes mellitus without complications: Secondary | ICD-10-CM | POA: Insufficient documentation

## 2011-01-22 DIAGNOSIS — M79609 Pain in unspecified limb: Secondary | ICD-10-CM | POA: Insufficient documentation

## 2011-01-22 DIAGNOSIS — L959 Vasculitis limited to the skin, unspecified: Secondary | ICD-10-CM

## 2011-01-22 LAB — POCT I-STAT, CHEM 8
BUN: 12 mg/dL (ref 6–23)
Calcium, Ion: 1.24 mmol/L (ref 1.12–1.32)
Hemoglobin: 11.9 g/dL — ABNORMAL LOW (ref 12.0–15.0)
TCO2: 28 mmol/L (ref 0–100)

## 2011-01-22 LAB — CBC
MCV: 89.1 fL (ref 78.0–100.0)
Platelets: 198 10*3/uL (ref 150–400)
RBC: 3.95 MIL/uL (ref 3.87–5.11)
RDW: 13.3 % (ref 11.5–15.5)
WBC: 5.7 10*3/uL (ref 4.0–10.5)

## 2011-01-22 LAB — PROTIME-INR: INR: 0.97 (ref 0.00–1.49)

## 2011-01-22 LAB — APTT: aPTT: 28 seconds (ref 24–37)

## 2011-01-22 MED ORDER — PREDNISONE 20 MG PO TABS
20.0000 mg | ORAL_TABLET | Freq: Once | ORAL | Status: DC
  Filled 2011-01-22: qty 1

## 2011-01-22 MED ORDER — HYDROCODONE-ACETAMINOPHEN 5-500 MG PO TABS: 1.0000 | ORAL_TABLET | Freq: Four times a day (QID) | ORAL | Status: AC | PRN

## 2011-01-22 MED ORDER — PREDNISONE 20 MG PO TABS
60.0000 mg | ORAL_TABLET | Freq: Once | ORAL | Status: AC
  Administered 2011-01-22: 60 mg via ORAL
  Filled 2011-01-22: qty 2

## 2011-01-22 MED ORDER — PREDNISONE (PAK) 10 MG PO TABS: ORAL_TABLET | ORAL | Status: DC

## 2011-01-22 NOTE — ED Notes (Addendum)
Pt reports rash to abdomen and bilateral lower legs. Reports its itchy, burny, hot and warm. Rash is red in nature. Reports she was in the hospital the last week in October and hx of allergic reactions to certain medications. Reports she was seen by PCP and was not prescribed anything.

## 2011-01-22 NOTE — ED Provider Notes (Signed)
History     CSN: [id number] Arrival date & time: 01/22/2011  7:06 PM   First MD Initiated Contact with Patient 01/22/11 2204      Chief Complaint  Patient presents with  . Rash    (Consider location/radiation/quality/duration/timing/severity/associated sxs/prior treatment) Patient is a 65 y.o. female presenting with rash. The history is provided by the patient.  Rash  This is a new problem. The current episode started more than 2 days ago. The problem has been gradually worsening. The problem is associated with new medications. There has been no fever. The rash is present on the right lower leg, right upper leg, left foot, right foot, right ankle, left toes, left ankle, left upper leg and left lower leg. The pain is at a severity of 7/10. The pain is moderate. The pain has been worsening since onset. Associated symptoms include blisters and pain. She has tried nothing for the symptoms.  Pt states she was in the hospital for  Generalized weakness and UTI and d/c 9 days ago. Since then has had worsening red rash to bilateral legs. States history of the same. Had to be treated with prednisone and chemotherapy in the past. States last time also occurred after a hospital stay and new medications. Denies fever, chills, malaise. Admits to pain.  Past Medical History  Diagnosis Date  . Diabetes mellitus     Past Surgical History  Procedure Date  . Tubal ligation   . Melanoma excision     History reviewed. No pertinent family history.  History  Substance Use Topics  . Smoking status: Never Smoker   . Smokeless tobacco: Never Used  . Alcohol Use: No    OB History    Grav Para Term Preterm Abortions TAB SAB Ect Mult Living                  Review of Systems  Constitutional: Negative.  Negative for fever and chills.  Eyes: Negative.   Respiratory: Negative.   Cardiovascular: Negative.   Musculoskeletal: Negative.   Skin: Positive for color change and rash.  Neurological:  Negative for weakness, light-headedness and headaches.  Hematological: Bruises/bleeds easily.    Allergies  Macrobid; Septra; and Sulfa drugs cross reactors  Home Medications   Current Outpatient Rx  Name Route Sig Dispense Refill  . ASPIRIN EC 81 MG PO TBEC Oral Take 81 mg by mouth daily.      . B COMPLEX PO TABS Oral Take 1 tablet by mouth daily.      Marland Kitchen VITAMIN D 1000 UNITS PO TABS Oral Take 2,000 Units by mouth daily.      . OMEGA-3 FATTY ACIDS 1000 MG PO CAPS Oral Take 1 g by mouth daily.      Marland Kitchen GLIPIZIDE 5 MG PO TABS Oral Take 5 mg by mouth 2 (two) times daily before a meal.      . GLUCOSAMINE-CHONDROITIN 500-400 MG PO TABS Oral Take 1 tablet by mouth 3 (three) times daily.      Marland Kitchen LEVOTHYROXINE SODIUM 75 MCG PO TABS Oral Take 75 mcg by mouth daily.      Marland Kitchen METFORMIN HCL 1000 MG PO TABS Oral Take 1,000 mg by mouth daily.        BP 145/75  Pulse 92  Temp(Src) 97.5 F (36.4 C) (Oral)  Resp 14  SpO2 93%  Physical Exam  Nursing note and vitals reviewed. Constitutional: She is oriented to person, place, and time. She appears well-developed and well-nourished. No distress.  HENT:  Head: Normocephalic and atraumatic.  Eyes: Conjunctivae are normal. Pupils are equal, round, and reactive to light.  Neck: Neck supple.  Cardiovascular: Normal rate, regular rhythm and normal heart sounds.   Pulmonary/Chest: Effort normal and breath sounds normal.  Neurological: She is alert and oriented to person, place, and time. She has normal reflexes.  Skin: Skin is warm and dry.       Erythemous, flat, non blanching, petechial rash to bilateral feet, lower extremeties. Tender.    Psychiatric: She has a normal mood and affect.    ED Course  Procedures (including critical care time)  Results for orders placed during the hospital encounter of 01/22/11  CBC      Component Value Range   WBC 5.7  4.0 - 10.5 (K/uL)   RBC 3.95  3.87 - 5.11 (MIL/uL)   Hemoglobin 11.8 (*) 12.0 - 15.0 (g/dL)    HCT 21.3 (*) 08.6 - 46.0 (%)   MCV 89.1  78.0 - 100.0 (fL)   MCH 29.9  26.0 - 34.0 (pg)   MCHC 33.5  30.0 - 36.0 (g/dL)   RDW 57.8  46.9 - 62.9 (%)   Platelets 198  150 - 400 (K/uL)  APTT      Component Value Range   aPTT 28  24 - 37 (seconds)  PROTIME-INR      Component Value Range   Prothrombin Time 13.1  11.6 - 15.2 (seconds)   INR 0.97  0.00 - 1.49   POCT I-STAT, CHEM 8      Component Value Range   Sodium 139  135 - 145 (mEq/L)   Potassium 4.0  3.5 - 5.1 (mEq/L)   Chloride 103  96 - 112 (mEq/L)   BUN 12  6 - 23 (mg/dL)   Creatinine, Ser 5.28  0.50 - 1.10 (mg/dL)   Glucose, Bld 413 (*) 70 - 99 (mg/dL)   Calcium, Ion 2.44  0.10 - 1.32 (mmol/L)   TCO2 28  0 - 100 (mmol/L)   Hemoglobin 11.9 (*) 12.0 - 15.0 (g/dL)   HCT 27.2 (*) 53.6 - 46.0 (%)   Pt with vasculitis of LE. Normal platelets, normal PT/PTT. Normal istat chem 8 except for glu of 200, hgb 11.9. Pt afebrile. No other symptoms/complaints.  Will d/c home on prednisone and pain medications with close follow up with her rheumatologist or PCP.      MDM          Lottie Mussel, PA 01/22/11 2332

## 2011-01-23 NOTE — ED Provider Notes (Signed)
Medical screening examination/treatment/procedure(s) were performed by non-physician practitioner and as supervising physician I was immediately available for consultation/collaboration.  Flint Melter, MD 01/23/11 [phone]

## 2011-03-12 ENCOUNTER — Telehealth: Payer: Self-pay | Admitting: *Deleted

## 2011-04-16 ENCOUNTER — Encounter: Payer: Self-pay | Admitting: Medical Oncology

## 2011-04-19 ENCOUNTER — Encounter: Payer: Self-pay | Admitting: Oncology

## 2011-04-19 ENCOUNTER — Ambulatory Visit (HOSPITAL_BASED_OUTPATIENT_CLINIC_OR_DEPARTMENT_OTHER): Payer: Medicare Other | Admitting: Oncology

## 2011-04-19 ENCOUNTER — Telehealth: Payer: Self-pay | Admitting: Oncology

## 2011-04-19 ENCOUNTER — Other Ambulatory Visit: Payer: Medicare Other | Admitting: Lab

## 2011-04-19 VITALS — BP 114/73 | HR 98 | Temp 97.0°F | Wt 172.3 lb

## 2011-04-19 DIAGNOSIS — D472 Monoclonal gammopathy: Secondary | ICD-10-CM

## 2011-04-19 LAB — CBC WITH DIFFERENTIAL/PLATELET
BASO%: 0.3 % (ref 0.0–2.0)
LYMPH%: 27.9 % (ref 14.0–49.7)
MCHC: 34.2 g/dL (ref 31.5–36.0)
MCV: 83.2 fL (ref 79.5–101.0)
MONO#: 0.3 10*3/uL (ref 0.1–0.9)
MONO%: 6.9 % (ref 0.0–14.0)
Platelets: 181 10*3/uL (ref 145–400)
RBC: 4.6 10*6/uL (ref 3.70–5.45)
RDW: 13.8 % (ref 11.2–14.5)
WBC: 4.8 10*3/uL (ref 3.9–10.3)

## 2011-04-19 NOTE — Progress Notes (Signed)
CC:   [name], M.D.  PROBLEM LIST: 1. 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  Mrs.     Schwanz has been seen by me since October 2008.  She has an     associated low IgG level.  Urine immunofixation electrophoresis was     negative in December 2008.  Urinary protein was low.  A bone marrow     aspirate and biopsy carried out by me on 08/28/1998 showed variable     cellularity with scattered minute lymphoid aggregates.  There was a     small percentage of B cells which showed excess expression of kappa     light chains.  There was no plasmacytosis or any evidence of     amyloid.  Cytogenetics were normal, and there were no clonal     chromosomal abnormalities.  Mild splenomegaly with the spleen     measuring 17 cm was noted on a CT scan of abdomen and pelvis on     12/11/2008.  The patient remains under observation. 2. History of rheumatoid arthritis with onset at least 1999. 3. 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  A biopsy of a leg lesion on     07/11/2009 established the pathologic diagnosis. 4. History of cryoglobulinemia dating back to 2000. 5. Diabetes mellitus. 6. History of melanoma involving the right face, 1999. 7. History of hypothyroidism. 8. Recurrent shingles involving the left hip.  MEDICATIONS: 1. Acyclovir 400 mg as needed for outbreak of vesicular rash around     the left hip region. 2. Aspirin 81 mg daily. 3. B complex vitamins 1 daily. 4. Vitamin D 2000 units daily. 5. Omega-3 fish oil 1000 mg daily. 6. Glucotrol 5 mg twice a day. 7. Glucosamine chondroitin 500-400 mg tablets 3 times daily. 8. Synthroid 75 mcg daily. 9. Glucophage 1000 mg daily.  HISTORY:  Ms. Prothero is a 66 year old woman who has been followed by me for an IgM kappa monoclonal gammopathy  which probably relates to her autoimmune disease as described above.  There is currently no evidence for multiple myeloma or lymphoma.  The patient lives with her daughter in a 1 level townhouse in [place].  She is independent and drives. She tells me that she was hospitalized from October 24 through October 26 with a urinary tract infection.  We have reviewed those records.  The patient is currently asymptomatic.  Denies any fever, chills, night sweats, urinary symptoms, impairment of energy or appetite.  No active arthritis or skin problems at this time.  PHYSICAL EXAM:  She looks well.  Weight is 172 pounds, 4.8 ounces. Weight is essentially stable with some minor variations.  Blood pressure 114/73.  Other vital signs are normal.  There is no scleral icterus. Mouth and pharynx are benign.  No peripheral adenopathy palpable.  Heart and lungs are normal.  Breasts are not examined.  The patient says that she gets yearly mammograms at [facility].  Abdomen is somewhat obese, nontender with no organomegaly or masses palpable.  I could not appreciate any enlargement of the spleen.  Extremities:  No peripheral edema or clubbing.  No rashes over the legs.  No evidence of vasculitis. Neurologic exam is grossly normal.  LABORATORY DATA:  Today white count 4.8, ANC 3.1, hemoglobin 13.1, hematocrit 38.2,  platelets 181,000.  Chemistries and quantitative immunoglobulins are pending today.  Chemistries from 01/06/2011 notable for a glucose of 227.  Total protein was 6.5 with an albumin of 3.6.  On 10/12/2010 glucose was 141, total protein 7.1, albumin 4.2, and those globulins were 2.9.  Liver function tests including LDH were normal. BUN was 15, creatinine 0.66.  On 10/12/2010 IgM level was 885 as compared with 1000 on 04/13/2010 and 1280 on 10/09/2009.  IgG level has been persistently low 320 with normal being [phone].  IgA was normal at 288.  IMAGING STUDIES: 1. CT scan of abdomen  and pelvis with contrast on 12/11/2008 showed a     spleen that was enlarged measuring 17 cm in length without any     focal lesions.  There was some patchy bilateral lower lobe and     lingular pulmonary opacity which could reflect acute infection or     atelectasis.  There was no  lymphadenopathy. 2. Chest x-ray 2 view from 01/06/2011 showed mild left basilar     atelectasis.  Lungs were otherwise clear.  IMPRESSION AND PLAN:  Ms. Orrick continues to do well with basically stable, but somewhat fluctuating immunoglobulin levels.  The patient understands that we are following her for the possibility that she could have a low-grade underlying lymphoproliferative disorder.  We have not done any extensive evaluation on her recently, but as noted she did have a bone marrow aspirate and biopsy back on August 28, 1998 and she has had CT scan of abdomen and pelvis most recently on 12/11/2008 without any convincing evidence for lymphoma.  Of note, however was the enlarged spleen.  We can monitor this conservatively.  Certainly on physical exam today, I did not appreciate any splenomegaly.  Will plan to see Ms. Gural again in 6 months at which time will check CBC, chemistries and quantitative immunoglobulins.    ______________________________ [name], M.D. DSM/MEDQ  D:  04/19/2011  T:  04/19/2011  Job:  [id number]

## 2011-04-19 NOTE — Progress Notes (Signed)
This office note has been dictated.  #[id number]

## 2011-04-19 NOTE — Telephone Encounter (Signed)
Gv pt appt for aug2013 °

## 2011-04-20 LAB — COMPREHENSIVE METABOLIC PANEL
ALT: 33 U/L (ref 0–35)
Alkaline Phosphatase: 49 U/L (ref 39–117)
Sodium: 137 mEq/L (ref 135–145)
Total Bilirubin: 0.7 mg/dL (ref 0.3–1.2)
Total Protein: 6.8 g/dL (ref 6.0–8.3)

## 2011-05-07 ENCOUNTER — Telehealth: Payer: Self-pay

## 2011-05-07 NOTE — Telephone Encounter (Signed)
Screening mammogram received and forwarded to Dr Arline Asp

## 2011-05-28 ENCOUNTER — Encounter: Payer: Self-pay | Admitting: Oncology

## 2011-07-12 ENCOUNTER — Other Ambulatory Visit (HOSPITAL_COMMUNITY)
Admission: RE | Admit: 2011-07-12 | Discharge: 2011-07-12 | Disposition: A | Discharge status: Home / Self Care | Payer: Medicare Other | Source: Ambulatory Visit | Attending: Obstetrics and Gynecology | Admitting: Obstetrics and Gynecology

## 2011-07-12 ENCOUNTER — Other Ambulatory Visit: Payer: Self-pay | Admitting: Obstetrics and Gynecology

## 2011-07-12 DIAGNOSIS — Z124 Encounter for screening for malignant neoplasm of cervix: Secondary | ICD-10-CM | POA: Insufficient documentation

## 2011-09-13 ENCOUNTER — Telehealth: Payer: Self-pay | Admitting: Oncology

## 2011-09-13 NOTE — Telephone Encounter (Signed)
called pt and moved appt out for tx pt   aom

## 2011-10-18 ENCOUNTER — Ambulatory Visit: Payer: Medicare Other | Admitting: Oncology

## 2011-10-18 ENCOUNTER — Other Ambulatory Visit: Payer: Medicare Other | Admitting: Lab

## 2011-11-12 ENCOUNTER — Other Ambulatory Visit (HOSPITAL_BASED_OUTPATIENT_CLINIC_OR_DEPARTMENT_OTHER): Payer: Medicare Other | Admitting: Lab

## 2011-11-12 ENCOUNTER — Ambulatory Visit (HOSPITAL_BASED_OUTPATIENT_CLINIC_OR_DEPARTMENT_OTHER): Payer: Medicare Other | Admitting: Oncology

## 2011-11-12 ENCOUNTER — Encounter: Payer: Self-pay | Admitting: Oncology

## 2011-11-12 ENCOUNTER — Telehealth: Payer: Self-pay | Admitting: Oncology

## 2011-11-12 VITALS — BP 139/75 | HR 93 | Temp 98.7°F | Resp 20 | Ht 64.0 in | Wt 170.3 lb

## 2011-11-12 DIAGNOSIS — D472 Monoclonal gammopathy: Secondary | ICD-10-CM

## 2011-11-12 LAB — CBC WITH DIFFERENTIAL/PLATELET
Eosinophils Absolute: 0 10*3/uL (ref 0.0–0.5)
HCT: 37.9 % (ref 34.8–46.6)
HGB: 13.1 g/dL (ref 11.6–15.9)
LYMPH%: 25.2 % (ref 14.0–49.7)
MONO#: 0.3 10*3/uL (ref 0.1–0.9)
NEUT#: 3.1 10*3/uL (ref 1.5–6.5)
NEUT%: 66.9 % (ref 38.4–76.8)
Platelets: 158 10*3/uL (ref 145–400)
RBC: 4.62 10*6/uL (ref 3.70–5.45)
WBC: 4.6 10*3/uL (ref 3.9–10.3)
nRBC: 0 % (ref 0–0)

## 2011-11-12 LAB — LACTATE DEHYDROGENASE (CC13): LDH: 142 U/L (ref 125–220)

## 2011-11-12 LAB — COMPREHENSIVE METABOLIC PANEL (CC13)
ALT: 35 U/L (ref 0–55)
CO2: 25 mEq/L (ref 22–29)
Sodium: 138 mEq/L (ref 136–145)
Total Bilirubin: 0.6 mg/dL (ref 0.20–1.20)
Total Protein: 6.9 g/dL (ref 6.4–8.3)

## 2011-11-12 NOTE — Progress Notes (Signed)
This office note has been dictated.  #[id number]

## 2011-11-12 NOTE — Progress Notes (Signed)
CC:   Emily Kennedy, M.D.  PROBLEM LIST:  1. 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 Mrs.  Emily Kennedy has been seen by me since October 2008. She has an  associated low IgG level. Urine immunofixation electrophoresis was  negative in December 2008. Urinary protein was low. A bone marrow  aspirate and biopsy carried out by me on 08/28/1998 showed variable  cellularity with scattered minute lymphoid aggregates. There was a  small percentage of B cells which showed excess expression of kappa  light chains. There was no plasmacytosis or any evidence of  amyloid. Cytogenetics were normal, and there were no clonal  chromosomal abnormalities. Mild splenomegaly with the spleen  measuring 17 cm was noted on a CT scan of abdomen and pelvis on  12/11/2008. The patient remains under observation.  2. History of rheumatoid arthritis with onset at least 1999.  3. 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 A biopsy of a leg lesion on  07/11/2009 established the pathologic diagnosis.  4. History of cryoglobulinemia dating back to 2000.  5. Diabetes mellitus.  6. History of melanoma involving the right face, 1999.  7. History of hypothyroidism.  8. Recurrent shingles involving the left hip.     MEDICATIONS:  1. Acyclovir 400 mg as needed for outbreak of vesicular rash around  the left hip region.  2. Aspirin 81 mg daily.  3. B complex vitamins 1 daily.  4. Vitamin D 2000 units daily.  5. Omega-3 fish oil 1000 mg daily.  6. Glucotrol 5 mg daily.  7. Glucosamine chondroitin 500-400 mg tablets 3 times daily.  8. Synthroid 75 mcg daily.  9. Glucophage 1000 mg twice daily.   SMOKING HISTORY:  The patient has never smoked cigarettes.   HISTORY:  I saw Emily Kennedy today for followup of an IgM kappa monoclonal  gammopathy which we suspect relates to her autoimmune disease.  The patient was last seen by [place] on 04/19/2011.  The past 6 months have been rather uneventful and she denies any problems.  Energy and appetite are good.  She denies any arthritis or skin problems at this time.  No fever, chills, sweats or pain.  PHYSICAL EXAM:  The patient looks well.  She recently turned 66.  Weight is 170.3 pounds.  Blood pressure 139/75.  Other vital signs are normal. There is no scleral icterus.  Mouth and pharynx are benign.  No peripheral adenopathy palpable.  Heart and lungs are normal.  Breasts are not examined.  Abdomen is benign with no organomegaly or masses palpable.  I could not appreciate any splenomegaly.  Extremities:  No peripheral edema or clubbing.  No obvious rashes or evidence of vasculitis.  Neurologic exam was normal.  LABORATORY DATA:  Today, white count 4.6, ANC 3.1, hemoglobin 13.1, hematocrit 37.9, platelets 158,000.  Chemistries today were normal except for a glucose of 154.  Quantitative immunoglobulins are pending. On 04/19/2011 IgM level was 1030, IgG 393 and IgA 418.  Both the IgM and IgA levels were elevated and the IgM level is low.  These values are fairly stable compared to prior values.   IMAGING STUDIES:  1. CT scan of abdomen and pelvis with contrast on 12/11/2008 showed a  spleen that was enlarged measuring 17 cm in length without any  focal lesions. There was some patchy bilateral lower lobe and  lingular pulmonary opacity which could reflect acute infection or  atelectasis. There was no lymphadenopathy.  2. Chest x-ray 2 view from 01/06/2011 showed mild left basilar  atelectasis. Lungs were otherwise clear. 3. Bilateral screening mammograms carried out at [facility] on 05/05/2011 were negative.   IMPRESSION AND PLAN:  Ms. Emily Kennedy continues to do well with an IgM kappa monoclonal gammopathy that most likely is benign and relates to her autoimmune  disease.  She continues to do extremely well at this time. We will plan to see her again in 6 months at which time we will check CBC, chemistries and quantitative immunoglobulins.  If all is well then we may see the patient on a yearly basis and check quantitative immunoglobulins every 6 months.    ______________________________ [name], M.D. DSM/MEDQ  D:  11/12/2011  T:  11/12/2011  Job:  [id number]

## 2011-11-12 NOTE — Telephone Encounter (Signed)
Gave pt appt calendar for February 2014 lab and MD °

## 2011-11-13 LAB — IGG, IGA, IGM: IgM, Serum: 977 mg/dL — ABNORMAL HIGH (ref 52–322)

## 2012-04-27 ENCOUNTER — Telehealth: Payer: Self-pay | Admitting: Oncology

## 2012-04-27 ENCOUNTER — Other Ambulatory Visit: Payer: Self-pay

## 2012-04-27 NOTE — Telephone Encounter (Signed)
s.w. pt and adviseon 2.19.14 appt....pt ok and aware

## 2012-05-03 ENCOUNTER — Other Ambulatory Visit: Payer: Medicare Other | Admitting: Lab

## 2012-05-03 ENCOUNTER — Ambulatory Visit: Payer: Medicare Other | Admitting: Oncology

## 2012-05-12 ENCOUNTER — Ambulatory Visit: Payer: Medicare Other | Admitting: Oncology

## 2012-05-12 ENCOUNTER — Other Ambulatory Visit: Payer: Medicare Other | Admitting: Lab

## 2012-10-16 ENCOUNTER — Emergency Department (HOSPITAL_COMMUNITY): Payer: Medicare Other

## 2012-10-16 ENCOUNTER — Emergency Department (HOSPITAL_COMMUNITY)
Admission: EM | Admit: 2012-10-16 | Discharge: 2012-10-17 | Disposition: A | Discharge status: Home / Self Care | Payer: Medicare Other | Attending: Emergency Medicine | Admitting: Emergency Medicine

## 2012-10-16 ENCOUNTER — Encounter (HOSPITAL_COMMUNITY): Payer: Self-pay

## 2012-10-16 DIAGNOSIS — R0602 Shortness of breath: Secondary | ICD-10-CM | POA: Insufficient documentation

## 2012-10-16 DIAGNOSIS — R0789 Other chest pain: Secondary | ICD-10-CM | POA: Insufficient documentation

## 2012-10-16 DIAGNOSIS — E119 Type 2 diabetes mellitus without complications: Secondary | ICD-10-CM | POA: Insufficient documentation

## 2012-10-16 DIAGNOSIS — R079 Chest pain, unspecified: Secondary | ICD-10-CM

## 2012-10-16 DIAGNOSIS — R059 Cough, unspecified: Secondary | ICD-10-CM | POA: Insufficient documentation

## 2012-10-16 DIAGNOSIS — Z7982 Long term (current) use of aspirin: Secondary | ICD-10-CM | POA: Insufficient documentation

## 2012-10-16 DIAGNOSIS — Z79899 Other long term (current) drug therapy: Secondary | ICD-10-CM | POA: Insufficient documentation

## 2012-10-16 DIAGNOSIS — R05 Cough: Secondary | ICD-10-CM

## 2012-10-16 LAB — CBC
HCT: 38.3 % (ref 36.0–46.0)
MCH: 28.2 pg (ref 26.0–34.0)
MCHC: 34.5 g/dL (ref 30.0–36.0)
MCV: 81.8 fL (ref 78.0–100.0)
RDW: 13.2 % (ref 11.5–15.5)

## 2012-10-16 LAB — POCT I-STAT TROPONIN I: Troponin i, poc: 0 ng/mL (ref 0.00–0.08)

## 2012-10-16 NOTE — ED Notes (Signed)
Pt c/o productive cough with clear colored mucus, SOB, and dizziness x1 month. Pt has been seen at her pcp and had blood work collected w/no new dx. Pt reports she is taking OTC Mucinex with no relief. Pt denies chest pain, N/V, back/arm/abd pain, or diaphoresis

## 2012-10-17 LAB — BASIC METABOLIC PANEL
Calcium: 10.2 mg/dL (ref 8.4–10.5)
GFR calc Af Amer: >90 mL/min (ref >90)
GFR calc non Af Amer: >90 mL/min (ref >90)
Glucose, Bld: 133 mg/dL — ABNORMAL HIGH (ref 70–99)
Potassium: 4.4 mEq/L (ref 3.5–5.1)
Sodium: 135 mEq/L (ref 135–145)

## 2012-10-17 LAB — PRO B NATRIURETIC PEPTIDE: Pro B Natriuretic peptide (BNP): 72.9 pg/mL (ref 0–125)

## 2012-10-17 MED ORDER — BENZONATATE 100 MG PO CAPS: 100.0000 mg | ORAL_CAPSULE | Freq: Three times a day (TID) | ORAL | Status: DC

## 2012-10-17 MED ORDER — OMEPRAZOLE 20 MG PO CPDR: 40.0000 mg | DELAYED_RELEASE_CAPSULE | Freq: Every day | ORAL | Status: DC

## 2012-10-17 NOTE — ED Provider Notes (Signed)
CSN: [id number]     Arrival date & time 10/16/12  2056 History     First MD Initiated Contact with Patient 10/17/12 0008     Chief Complaint  Patient presents with  . Cough  . Shortness of Breath   HPI Pt has been having trouble with shortness of breath for over one month.  She feels like something is in her chest.  She feels a pressure in her left chest and she coughs a lot.  No  pain.  No nausea vomiting or weakness.  Nothing seems to make it better or worse.  She has tried otc meds.  SHe does not have trouble with exertion and this does not seem to make the pain any worse.  No leg swelling, no history of blood clots.  Past Medical History  Diagnosis Date  . Diabetes mellitus    Past Surgical History  Procedure Laterality Date  . Tubal ligation    . Melanoma excision     Family History  Problem Relation Age of Onset  . Cancer Sister    History  Substance Use Topics  . Smoking status: Never Smoker   . Smokeless tobacco: Never Used  . Alcohol Use: No   OB History   Grav Para Term Preterm Abortions TAB SAB Ect Mult Living                 Review of Systems  All other systems reviewed and are negative.    Allergies  Ciprofloxacin; Nitrofurantoin monohyd macro; Septra; and Sulfa drugs cross reactors  Home Medications   Current Outpatient Rx  Name  Route  Sig  Dispense  Refill  . acyclovir (ZOVIRAX) 400 MG tablet   Oral   Take 400 mg by mouth as needed (for break outs).          Marland Kitchen aspirin EC 81 MG tablet   Oral   Take 81 mg by mouth daily.           . fish oil-omega-3 fatty acids 1000 MG capsule   Oral   Take 1 g by mouth daily.           Marland Kitchen glipiZIDE (GLUCOTROL) 5 MG tablet   Oral   Take 5 mg by mouth daily.          Marland Kitchen l-methylfolate-B6-B12 (METANX) 3-35-2 MG TABS   Oral   Take 1 tablet by mouth daily.         Marland Kitchen levothyroxine (SYNTHROID, LEVOTHROID) 75 MCG tablet   Oral   Take 75-150 mcg by mouth daily. Take 2 tablets on Sunday and then take  1 tablet the other days         . metFORMIN (GLUCOPHAGE) 1000 MG tablet   Oral   Take 1,000 mg by mouth 2 (two) times daily with a meal.          . Misc Natural Products (GLUCOSAMINE CHONDROITIN ADV PO)   Oral   Take 1 tablet by mouth 3 (three) times daily.         . benzonatate (TESSALON) 100 MG capsule   Oral   Take 1 capsule (100 mg total) by mouth every 8 (eight) hours.   21 capsule   0   . omeprazole (PRILOSEC) 20 MG capsule   Oral   Take 2 capsules (40 mg total) by mouth daily.   20 capsule   1    BP 143/78  Pulse 77  Temp(Src) 97.8 F (36.6 C) (Oral)  Resp 20  SpO2 100% Physical Exam  Nursing note and vitals reviewed. Constitutional: She appears well-developed and well-nourished. No distress.  HENT:  Head: Normocephalic and atraumatic.  Right Ear: External ear normal.  Left Ear: External ear normal.  Eyes: Conjunctivae are normal. Right eye exhibits no discharge. Left eye exhibits no discharge. No scleral icterus.  Neck: Neck supple. No tracheal deviation present.  Cardiovascular: Normal rate, regular rhythm and intact distal pulses.   Pulmonary/Chest: Effort normal and breath sounds normal. No stridor. No respiratory distress. She has no wheezes. She has no rales.  Abdominal: Soft. Bowel sounds are normal. She exhibits no distension. There is no tenderness. There is no rebound and no guarding.  Musculoskeletal: She exhibits no edema and no tenderness.  Neurological: She is alert. She has normal strength. No sensory deficit. Cranial nerve deficit:  no gross defecits noted. She exhibits normal muscle tone. She displays no seizure activity. Coordination normal.  Skin: Skin is warm and dry. No rash noted.  Psychiatric: She has a normal mood and affect.    ED Course   Rate: 74  Rhythm: normal sinus rhythm  QRS Axis: normal  Intervals: normal  ST/T Wave abnormalities: normal  Conduction Disutrbances:none  Narrative Interpretation:   Old EKG Reviewed:  none available  Procedures (including critical care time)  Labs Reviewed  BASIC METABOLIC PANEL - Abnormal; Notable for the following:    Glucose, Bld 133 (*)    All other components within normal limits  CBC  PRO B NATRIURETIC PEPTIDE  POCT I-STAT TROPONIN I   Dg Chest 2 View  10/16/2012   *RADIOLOGY REPORT*  Clinical Data: Cough and shortness of breath.  Weakness  CHEST - 2 VIEW  Comparison: 01/06/2011  Findings: The cardiac silhouette is normal in size and configuration.  The mediastinum is normal in contour caliber. There are no hilar masses.  There is mild stable scarring in the left lateral lung base.  The lungs otherwise clear.  No pleural effusion or pneumothorax.  The bony thorax is demineralized but intact.  IMPRESSION: No acute cardiopulmonary disease.   Original Report Authenticated By: Amie Portland, M.D.   1. Chest pain   2. Cough     MDM  Doubt ACS with the symptoms she describes.  No PNA.  Doubt PE.  Discussed findings with patient.  At this time there does not appear to be any evidence of an acute emergency medical condition and the patient appears stable for discharge with appropriate outpatient follow up.       Will try antacids.  Follow up with PCP.  Consider outpatient stress testing, further eval.  Celene Kras, MD 10/17/12 [phone]

## 2012-11-20 ENCOUNTER — Telehealth: Payer: Self-pay | Admitting: Oncology

## 2012-11-20 NOTE — Telephone Encounter (Signed)
gv and printed appt sched and avs for pt for SEpt...former pt of Dr. Arline Asp

## 2012-12-01 ENCOUNTER — Telehealth: Payer: Self-pay | Admitting: *Deleted

## 2012-12-01 ENCOUNTER — Telehealth: Payer: Self-pay | Admitting: Internal Medicine

## 2012-12-01 NOTE — Telephone Encounter (Signed)
Lm informed the pt that on 12/05/12 her arrival time has changed. gv appts for 12/05/12 w/ labs@ 2pm and ov@ 2:30pm....td

## 2012-12-04 ENCOUNTER — Other Ambulatory Visit: Payer: Self-pay | Admitting: Internal Medicine

## 2012-12-04 DIAGNOSIS — D472 Monoclonal gammopathy: Secondary | ICD-10-CM

## 2012-12-05 ENCOUNTER — Ambulatory Visit: Payer: Medicare Other

## 2012-12-05 ENCOUNTER — Other Ambulatory Visit: Payer: Medicare Other | Admitting: Lab

## 2012-12-07 ENCOUNTER — Telehealth: Payer: Self-pay | Admitting: Internal Medicine

## 2012-12-07 NOTE — Telephone Encounter (Signed)
Returned pt's call re r/s appt. lmonvm for pt re call back at her convenience to let me know when she can come in.

## 2013-01-18 ENCOUNTER — Other Ambulatory Visit: Payer: Self-pay

## 2013-12-28 ENCOUNTER — Other Ambulatory Visit: Payer: Self-pay

## 2014-01-11 ENCOUNTER — Telehealth: Payer: Self-pay | Admitting: Hematology

## 2014-01-11 NOTE — Telephone Encounter (Signed)
returned pt call adn r/s appt per pt request....pt ok adn aware

## 2014-01-25 ENCOUNTER — Telehealth: Payer: Self-pay | Admitting: Hematology

## 2014-01-25 ENCOUNTER — Other Ambulatory Visit: Payer: Self-pay

## 2014-01-25 DIAGNOSIS — D472 Monoclonal gammopathy: Secondary | ICD-10-CM

## 2014-01-25 NOTE — Telephone Encounter (Signed)
Confirm appt d/t for 01/29/14. °

## 2014-01-28 ENCOUNTER — Other Ambulatory Visit: Payer: Medicare Other

## 2014-01-28 ENCOUNTER — Ambulatory Visit: Payer: Medicare Other | Admitting: Hematology

## 2014-01-29 ENCOUNTER — Other Ambulatory Visit (HOSPITAL_BASED_OUTPATIENT_CLINIC_OR_DEPARTMENT_OTHER): Payer: Medicare Other

## 2014-01-29 ENCOUNTER — Telehealth: Payer: Self-pay | Admitting: Hematology

## 2014-01-29 ENCOUNTER — Ambulatory Visit (HOSPITAL_BASED_OUTPATIENT_CLINIC_OR_DEPARTMENT_OTHER): Payer: Medicare Other | Admitting: Hematology

## 2014-01-29 ENCOUNTER — Encounter: Payer: Self-pay | Admitting: Hematology

## 2014-01-29 VITALS — BP 156/65 | HR 97 | Temp 98.4°F | Resp 18 | Ht 64.0 in | Wt 163.9 lb

## 2014-01-29 DIAGNOSIS — D472 Monoclonal gammopathy: Secondary | ICD-10-CM

## 2014-01-29 LAB — CBC WITH DIFFERENTIAL/PLATELET
BASO%: 0.5 % (ref 0.0–2.0)
Basophils Absolute: 0 10*3/uL (ref 0.0–0.1)
EOS ABS: 0 10*3/uL (ref 0.0–0.5)
EOS%: 0.6 % (ref 0.0–7.0)
HEMATOCRIT: 37.9 % (ref 34.8–46.6)
HGB: 12.3 g/dL (ref 11.6–15.9)
LYMPH#: 1.2 10*3/uL (ref 0.9–3.3)
LYMPH%: 20.9 % (ref 14.0–49.7)
MCH: 26 pg (ref 25.1–34.0)
MCHC: 32.5 g/dL (ref 31.5–36.0)
MCV: 79.8 fL (ref 79.5–101.0)
MONO#: 0.4 10*3/uL (ref 0.1–0.9)
MONO%: 7.6 % (ref 0.0–14.0)
NEUT%: 70.4 % (ref 38.4–76.8)
NEUTROS ABS: 4 10*3/uL (ref 1.5–6.5)
PLATELETS: 200 10*3/uL (ref 145–400)
RBC: 4.75 10*6/uL (ref 3.70–5.45)
RDW: 14.7 % — ABNORMAL HIGH (ref 11.2–14.5)
WBC: 5.7 10*3/uL (ref 3.9–10.3)

## 2014-01-29 LAB — COMPREHENSIVE METABOLIC PANEL (CC13)
ALBUMIN: 4 g/dL (ref 3.5–5.0)
ALT: 33 U/L (ref 0–55)
ANION GAP: 10 meq/L (ref 3–11)
AST: 24 U/L (ref 5–34)
Alkaline Phosphatase: 56 U/L (ref 40–150)
BILIRUBIN TOTAL: 0.68 mg/dL (ref 0.20–1.20)
BUN: 9.6 mg/dL (ref 7.0–26.0)
CALCIUM: 10 mg/dL (ref 8.4–10.4)
CHLORIDE: 104 meq/L (ref 98–109)
CO2: 21 meq/L — AB (ref 22–29)
Creatinine: 0.8 mg/dL (ref 0.6–1.1)
GLUCOSE: 123 mg/dL (ref 70–140)
Potassium: 4.2 mEq/L (ref 3.5–5.1)
SODIUM: 135 meq/L — AB (ref 136–145)
TOTAL PROTEIN: 6.9 g/dL (ref 6.4–8.3)

## 2014-01-29 NOTE — Telephone Encounter (Signed)
Pt confirmed labs/ov per 11/17 POF, gave pt AVS..... KJ °

## 2014-01-29 NOTE — Progress Notes (Signed)
[place] OFFICE PROGRESS NOTE  Patient Care Team: Tommy Medal, MD as PCP - General (Internal Medicine)  PROBLEM LIST:  1. 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 Mrs.  Igoe has been seen by me since October 2008. She has an  associated low IgG level. Urine immunofixation electrophoresis was  negative in December 2008. Urinary protein was low. A bone marrow  aspirate and biopsy carried out by me on 08/28/1998 showed variable  cellularity with scattered minute lymphoid aggregates. There was a  small percentage of B cells which showed excess expression of kappa  light chains. There was no plasmacytosis or any evidence of  amyloid. Cytogenetics were normal, and there were no clonal  chromosomal abnormalities. Mild splenomegaly with the spleen  measuring 17 cm was noted on a CT scan of abdomen and pelvis on  12/11/2008. The patient remains under observation.  2. History of rheumatoid arthritis with onset at least 1999.  3. 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 A biopsy of a leg lesion on  07/11/2009 established the pathologic diagnosis.  4. History of cryoglobulinemia dating back to 2000.  5. Diabetes mellitus.  6. History of melanoma involving the right face, 1999.  7. History of hypothyroidism.  8. Recurrent shingles involving the left hip.  INTERVAL HISTORY: Ms Schoenfelder returns for follow up. She was last seen here by Dr. Ralene Ok in 10/2011, who has retired. She state she has been doing well in the past 2 years. She did have an episode of sinus infection a few month ago and it took 2-3 weeks to resolve, she usually gets it 1-2 time a year. No other serious infection lately. Her RA has been well controlled, no pain or activity limitations. She denies any pain, nausea, cough or  abdominal discomfort. She has good appetite and energy level, no recent fever, chills, or weight loss.   REVIEW OF SYSTEMS:   Constitutional: Denies fevers, chills or abnormal weight loss Eyes: Denies blurriness of vision Ears, nose, mouth, throat, and face: Denies mucositis or sore throat Respiratory: Denies cough, dyspnea or wheezes Cardiovascular: Denies palpitation, chest discomfort or lower extremity swelling Gastrointestinal:  Denies nausea, heartburn or change in bowel habits Skin: Denies abnormal skin rashes Lymphatics: Denies new lymphadenopathy or easy bruising Neurological:Denies numbness, tingling or new weaknesses Behavioral/Psych: Mood is stable, no new changes  All other systems were reviewed with the patient and are negative.  I have reviewed the past medical history, past surgical history, social history and family history with the patient and they are unchanged from previous note.  ALLERGIES:  is allergic to ciprofloxacin; nitrofurantoin monohyd macro; septra; sulfa drugs cross reactors; and ceclor.  MEDICATIONS:  Current Outpatient Prescriptions  Medication Sig Dispense Refill  . ACCU-CHEK AVIVA PLUS test strip   12  . ACCU-CHEK SOFTCLIX LANCETS lancets 2 (two) times daily.   12  . acyclovir (ZOVIRAX) 400 MG tablet Take 400 mg by mouth as needed (for break outs).     Marland Kitchen aspirin EC 81 MG tablet Take 81 mg by mouth daily.      . fish oil-omega-3 fatty acids 1000 MG capsule Take 1 g by mouth daily.      Marland Kitchen glipiZIDE (GLUCOTROL) 5 MG tablet Take 7.5 mg by mouth daily.     Marland Kitchen levothyroxine (SYNTHROID, LEVOTHROID) 75 MCG tablet Take 75-150 mcg by  mouth daily. Take 2 tablets on Sunday and then take 1 tablet the other days    . metFORMIN (GLUCOPHAGE) 1000 MG tablet Take 1,000 mg by mouth 2 (two) times daily with a meal.     . Misc Natural Products (GLUCOSAMINE CHONDROITIN ADV PO) Take 1 tablet by mouth 3 (three) times daily.    . valsartan (DIOVAN) 160 MG tablet Take 160 mg by  mouth. Daily  12  . vitamin A 10000 UNIT capsule Take 10,000 Units by mouth daily.    . vitamin B-12 (CYANOCOBALAMIN) 1000 MCG tablet Take 1,000 mcg by mouth 2 (two) times a week.     No current facility-administered medications for this visit.    PHYSICAL EXAMINATION: ECOG PERFORMANCE STATUS: 0 - Asymptomatic  Filed Vitals:   01/29/14 1007  BP: 156/65  Pulse: 97  Temp: 98.4 F (36.9 C)  Resp: 18   Filed Weights   01/29/14 1007  Weight: 163 lb 14.4 oz (74.345 kg)    GENERAL:alert, no distress and comfortable SKIN: skin color, texture, turgor are normal, no rashes or significant lesions EYES: normal, Conjunctiva are pink and non-injected, sclera clear OROPHARYNX:no exudate, no erythema and lips, buccal mucosa, and tongue normal  NECK: supple, thyroid normal size, non-tender, without nodularity LYMPH:  no palpable lymphadenopathy in the cervical, axillary or inguinal LUNGS: clear to auscultation and percussion with normal breathing effort HEART: regular rate & rhythm and no murmurs and no lower extremity edema ABDOMEN:abdomen soft, non-tender and normal bowel sounds Musculoskeletal:no cyanosis of digits and no clubbing  NEURO: alert & oriented x 3 with fluent speech, no focal motor/sensory deficits  LABORATORY DATA:  I have reviewed the data as listed    Component Value Date/Time   NA 135* 01/29/2014 0945   NA 135 10/16/2012 2322   K 4.2 01/29/2014 0945   K 4.4 10/16/2012 2322   CL 97 10/16/2012 2322   CL 103 11/12/2011 0845   CO2 21* 01/29/2014 0945   CO2 26 10/16/2012 2322   GLUCOSE 123 01/29/2014 0945   GLUCOSE 133* 10/16/2012 2322   GLUCOSE 154* 11/12/2011 0845   BUN 9.6 01/29/2014 0945   BUN 11 10/16/2012 2322   CREATININE 0.8 01/29/2014 0945   CREATININE 0.54 10/16/2012 2322   CREATININE 0.72 02/21/2007 0838   CALCIUM 10.0 01/29/2014 0945   CALCIUM 10.2 10/16/2012 2322   PROT 6.9 01/29/2014 0945   PROT 6.8 04/19/2011 0817   ALBUMIN 4.0 01/29/2014 0945    ALBUMIN 4.5 04/19/2011 0817   AST 24 01/29/2014 0945   AST 26 04/19/2011 0817   ALT 33 01/29/2014 0945   ALT 33 04/19/2011 0817   ALKPHOS 56 01/29/2014 0945   ALKPHOS 49 04/19/2011 0817   BILITOT 0.68 01/29/2014 0945   BILITOT 0.7 04/19/2011 0817   GFRNONAA >90 10/16/2012 2322   GFRAA >90 10/16/2012 2322    No results found for: SPEP, UPEP  Lab Results  Component Value Date   WBC 5.7 01/29/2014   NEUTROABS 4.0 01/29/2014   HGB 12.3 01/29/2014   HCT 37.9 01/29/2014   MCV 79.8 01/29/2014   PLT 200 01/29/2014      Chemistry      Component Value Date/Time   NA 135* 01/29/2014 0945   NA 135 10/16/2012 2322   K 4.2 01/29/2014 0945   K 4.4 10/16/2012 2322   CL 97 10/16/2012 2322   CL 103 11/12/2011 0845   CO2 21* 01/29/2014 0945   CO2 26 10/16/2012 2322   BUN  9.6 01/29/2014 0945   BUN 11 10/16/2012 2322   CREATININE 0.8 01/29/2014 0945   CREATININE 0.54 10/16/2012 2322   CREATININE 0.72 02/21/2007 0838      Component Value Date/Time   CALCIUM 10.0 01/29/2014 0945   CALCIUM 10.2 10/16/2012 2322   ALKPHOS 56 01/29/2014 0945   ALKPHOS 49 04/19/2011 0817   AST 24 01/29/2014 0945   AST 26 04/19/2011 0817   ALT 33 01/29/2014 0945   ALT 33 04/19/2011 0817   BILITOT 0.68 01/29/2014 0945   BILITOT 0.7 04/19/2011 0817     Outside lab on 01/08/2014 Total protein: 6.6 Alpha-1 globulin 0.2 Alpha-2 globulin 0.7 Beta globulin 1.0 Gamma globulin 0.7 M-spike 0.5 g/dl Ig G 190 Ig E 10 Ig M 671 (40-230) Ig A 58    RADIOGRAPHIC STUDIES: No new scans  ASSESSMENT & PLAN:  68 yo female with history of RA and vasculitis, here for follow up her monoclonal IgM MGUS.   She is doing very well, M-spike remains to be stable and low, IgM level is lower than 2 years ago. She has normal CBC, calcium andrenal function, no bone pain or other symptoms which are suspicious for MM.   Will continue follow her. I will repeat the lab and see her back in 6 months.  Orders Placed This  Encounter  Procedures  . CBC with Differential    Standing Status: Future     Number of Occurrences:      Standing Expiration Date: 01/30/2015  . Comprehensive metabolic panel (Cmet) - [facility]    Standing Status: Future     Number of Occurrences:      Standing Expiration Date: 01/30/2015  . Beta 2 microglobulin, serum    Standing Status: Future     Number of Occurrences:      Standing Expiration Date: 01/30/2015  . Kappa/lambda light chains    Standing Status: Future     Number of Occurrences:      Standing Expiration Date: 01/30/2015  . SPEP & IFE with QIG    Standing Status: Future     Number of Occurrences:      Standing Expiration Date: 01/30/2015   All questions were answered. The patient knows to call the clinic with any problems, questions or concerns. No barriers to learning was detected. I spent 15 minutes counseling the patient face to face. The total time spent in the appointment was 25 minutes and more than 50% was on counseling and review of test results     Truitt Merle, MD 01/29/2014 11:26 PM

## 2014-01-30 LAB — IGG, IGA, IGM
IGG (IMMUNOGLOBIN G), SERUM: 232 mg/dL — AB (ref 690–1700)
IgA: 289 mg/dL (ref 69–380)
IgM, Serum: 885 mg/dL — ABNORMAL HIGH (ref 52–322)

## 2014-03-04 ENCOUNTER — Other Ambulatory Visit: Payer: Self-pay | Admitting: Internal Medicine

## 2014-03-04 DIAGNOSIS — R161 Splenomegaly, not elsewhere classified: Secondary | ICD-10-CM

## 2014-03-18 ENCOUNTER — Other Ambulatory Visit: Payer: Medicare Other

## 2014-03-20 ENCOUNTER — Ambulatory Visit
Admission: RE | Admit: 2014-03-20 | Discharge: 2014-03-20 | Disposition: A | Discharge status: Home / Self Care | Payer: Medicare Other | Source: Ambulatory Visit | Attending: Internal Medicine | Admitting: Internal Medicine

## 2014-03-20 DIAGNOSIS — R161 Splenomegaly, not elsewhere classified: Secondary | ICD-10-CM

## 2014-07-23 ENCOUNTER — Other Ambulatory Visit (HOSPITAL_BASED_OUTPATIENT_CLINIC_OR_DEPARTMENT_OTHER): Payer: Medicare Other

## 2014-07-23 DIAGNOSIS — D472 Monoclonal gammopathy: Secondary | ICD-10-CM | POA: Diagnosis present

## 2014-07-23 LAB — COMPREHENSIVE METABOLIC PANEL (CC13)
ALT: 19 U/L (ref 0–55)
ANION GAP: 11 meq/L (ref 3–11)
AST: 15 U/L (ref 5–34)
Albumin: 3.6 g/dL (ref 3.5–5.0)
Alkaline Phosphatase: 54 U/L (ref 40–150)
BILIRUBIN TOTAL: 0.52 mg/dL (ref 0.20–1.20)
BUN: 7.3 mg/dL (ref 7.0–26.0)
CHLORIDE: 104 meq/L (ref 98–109)
CO2: 23 meq/L (ref 22–29)
Calcium: 9.4 mg/dL (ref 8.4–10.4)
Creatinine: 0.6 mg/dL (ref 0.6–1.1)
EGFR: >90 mL/min/{1.73_m2} (ref >90)
Glucose: 138 mg/dl (ref 70–140)
Potassium: 3.9 mEq/L (ref 3.5–5.1)
SODIUM: 137 meq/L (ref 136–145)
TOTAL PROTEIN: 6.4 g/dL (ref 6.4–8.3)

## 2014-07-23 LAB — CBC WITH DIFFERENTIAL/PLATELET
BASO%: 0.4 % (ref 0.0–2.0)
Basophils Absolute: 0 10*3/uL (ref 0.0–0.1)
EOS%: 0.9 % (ref 0.0–7.0)
Eosinophils Absolute: 0 10*3/uL (ref 0.0–0.5)
HCT: 33.2 % — ABNORMAL LOW (ref 34.8–46.6)
HGB: 11.1 g/dL — ABNORMAL LOW (ref 11.6–15.9)
LYMPH%: 19.8 % (ref 14.0–49.7)
MCH: 26.3 pg (ref 25.1–34.0)
MCHC: 33.5 g/dL (ref 31.5–36.0)
MCV: 78.5 fL — ABNORMAL LOW (ref 79.5–101.0)
MONO#: 0.4 10*3/uL (ref 0.1–0.9)
MONO%: 8.8 % (ref 0.0–14.0)
NEUT%: 70.1 % (ref 38.4–76.8)
NEUTROS ABS: 3.3 10*3/uL (ref 1.5–6.5)
Platelets: 184 10*3/uL (ref 145–400)
RBC: 4.22 10*6/uL (ref 3.70–5.45)
RDW: 14.6 % — AB (ref 11.2–14.5)
WBC: 4.7 10*3/uL (ref 3.9–10.3)
lymph#: 0.9 10*3/uL (ref 0.9–3.3)

## 2014-07-29 LAB — SPEP & IFE WITH QIG
ABNORMAL PROTEIN BAND1: 0.5 g/dL
Albumin ELP: 3.9 g/dL (ref 3.8–4.8)
Alpha-1-Globulin: 0.3 g/dL (ref 0.2–0.3)
Alpha-2-Globulin: 0.6 g/dL (ref 0.5–0.9)
BETA 2: 0.2 g/dL (ref 0.2–0.5)
BETA GLOBULIN: 0.4 g/dL (ref 0.4–0.6)
Gamma Globulin: 0.7 g/dL — ABNORMAL LOW (ref 0.8–1.7)
IGA: 243 mg/dL (ref 69–380)
IGG (IMMUNOGLOBIN G), SERUM: 215 mg/dL — AB (ref 690–1700)
IgM, Serum: 816 mg/dL — ABNORMAL HIGH (ref 52–322)
TOTAL PROTEIN, SERUM ELECTROPHOR: 6.2 g/dL (ref 6.1–8.1)

## 2014-07-29 LAB — KAPPA/LAMBDA LIGHT CHAINS
KAPPA LAMBDA RATIO: 3.58 — AB (ref 0.26–1.65)
Kappa free light chain: 3.22 mg/dL — ABNORMAL HIGH (ref 0.33–1.94)
Lambda Free Lght Chn: 0.9 mg/dL (ref 0.57–2.63)

## 2014-07-29 LAB — BETA 2 MICROGLOBULIN, SERUM: BETA 2 MICROGLOBULIN: 2.39 mg/L (ref <=2.51)

## 2014-07-30 ENCOUNTER — Ambulatory Visit (HOSPITAL_BASED_OUTPATIENT_CLINIC_OR_DEPARTMENT_OTHER): Payer: Medicare Other

## 2014-07-30 ENCOUNTER — Encounter: Payer: Self-pay | Admitting: Hematology

## 2014-07-30 ENCOUNTER — Ambulatory Visit (HOSPITAL_BASED_OUTPATIENT_CLINIC_OR_DEPARTMENT_OTHER): Payer: Medicare Other | Admitting: Hematology

## 2014-07-30 ENCOUNTER — Telehealth: Payer: Self-pay | Admitting: Hematology

## 2014-07-30 VITALS — BP 163/58 | HR 86 | Temp 98.6°F | Resp 18 | Ht 64.0 in | Wt 148.8 lb

## 2014-07-30 DIAGNOSIS — D649 Anemia, unspecified: Secondary | ICD-10-CM | POA: Diagnosis not present

## 2014-07-30 DIAGNOSIS — D472 Monoclonal gammopathy: Secondary | ICD-10-CM

## 2014-07-30 LAB — VITAMIN B12: Vitamin B-12: 658 pg/mL (ref 211–911)

## 2014-07-30 LAB — RETICULOCYTES
IMMATURE RETIC FRACT: 2.7 % (ref 1.60–10.00)
RBC: 4.35 10*6/uL (ref 3.70–5.45)
Retic %: 1.16 % (ref 0.70–2.10)
Retic Ct Abs: 50.46 10*3/uL (ref 33.70–90.70)

## 2014-07-30 LAB — IRON AND TIBC CHCC
%SAT: 5 % — ABNORMAL LOW (ref 21–57)
Iron: 20 ug/dL — ABNORMAL LOW (ref 41–142)
TIBC: 386 ug/dL (ref 236–444)
UIBC: 366 ug/dL (ref 120–384)

## 2014-07-30 LAB — FERRITIN CHCC: FERRITIN: 18 ng/mL (ref 9–269)

## 2014-07-30 NOTE — Addendum Note (Signed)
Addended by: Truitt Merle on: 07/30/2014 01:15 PM   Modules accepted: Orders

## 2014-07-30 NOTE — Progress Notes (Signed)
[place] OFFICE PROGRESS NOTE  Patient Care Team: Tommy Medal, MD as PCP - General (Internal Medicine)  PROBLEM LIST:  1. 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 Mrs.  Emily Kennedy has been seen by me since October 2008. She has an  associated low IgG level. Urine immunofixation electrophoresis was  negative in December 2008. Urinary protein was low. A bone marrow  aspirate and biopsy carried out by me on 08/28/1998 showed variable  cellularity with scattered minute lymphoid aggregates. There was a  small percentage of B cells which showed excess expression of kappa  light chains. There was no plasmacytosis or any evidence of  amyloid. Cytogenetics were normal, and there were no clonal  chromosomal abnormalities. Mild splenomegaly with the spleen  measuring 17 cm was noted on a CT scan of abdomen and pelvis on  12/11/2008. The patient remains under observation.  2. History of rheumatoid arthritis with onset at least 1999.  3. 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 A biopsy of a leg lesion on  07/11/2009 established the pathologic diagnosis.  4. History of cryoglobulinemia dating back to 2000.  5. Diabetes mellitus.  6. History of melanoma involving the right face, 1999.  7. History of hypothyroidism.  8. Recurrent shingles involving the left hip.  INTERVAL HISTORY: Ms Emily Kennedy returns for follow up. She was last seen by me 6 months ago. She recently changed her hypertension medication by her primary care physician Dr. Minna Antis, and had some retraction. She also noticed her vasculitis flared up lately, with rashes on the left lower extremity. She denies any bone pain, note chest pain, dyspnea or other new symptoms.   REVIEW OF SYSTEMS:   Constitutional: Denies fevers, chills or abnormal  weight loss Eyes: Denies blurriness of vision Ears, nose, mouth, throat, and face: Denies mucositis or sore throat Respiratory: Denies cough, dyspnea or wheezes Cardiovascular: Denies palpitation, chest discomfort or lower extremity swelling Gastrointestinal:  Denies nausea, heartburn or change in bowel habits Skin: Denies abnormal skin rashes Lymphatics: Denies new lymphadenopathy or easy bruising Neurological:Denies numbness, tingling or new weaknesses Behavioral/Psych: Mood is stable, no new changes  All other systems were reviewed with the patient and are negative.  I have reviewed the past medical history, past surgical history, social history and family history with the patient and they are unchanged from previous note.  ALLERGIES:  is allergic to ciprofloxacin; nitrofurantoin monohyd macro; septra; sulfa drugs cross reactors; and ceclor.  MEDICATIONS:  Current Outpatient Prescriptions  Medication Sig Dispense Refill  . ACCU-CHEK AVIVA PLUS test strip   12  . ACCU-CHEK SOFTCLIX LANCETS lancets 2 (two) times daily.   12  . acyclovir (ZOVIRAX) 400 MG tablet Take 400 mg by mouth as needed (for break outs).     Marland Kitchen aspirin EC 81 MG tablet Take 81 mg by mouth daily.      . fish oil-omega-3 fatty acids 1000 MG capsule Take 1 g by mouth daily.      Marland Kitchen glipiZIDE (GLUCOTROL) 5 MG tablet Take 7.5 mg by mouth daily.     Marland Kitchen levothyroxine (SYNTHROID, LEVOTHROID) 75 MCG tablet Take 75-150 mcg by mouth daily. Take 2 tablets on Sunday and then take 1 tablet the other days    . metFORMIN (GLUCOPHAGE) 1000 MG tablet Take 1,000 mg by mouth 2 (two) times daily with a meal.     .  Misc Natural Products (GLUCOSAMINE CHONDROITIN ADV PO) Take 1 tablet by mouth 3 (three) times daily.    . valsartan (DIOVAN) 160 MG tablet Take 160 mg by mouth. Daily  12  . vitamin A 10000 UNIT capsule Take 10,000 Units by mouth daily.    . vitamin B-12 (CYANOCOBALAMIN) 1000 MCG tablet Take 1,000 mcg by mouth 2 (two) times a  week.     No current facility-administered medications for this visit.    PHYSICAL EXAMINATION: ECOG PERFORMANCE STATUS: 0 - Asymptomatic  Filed Vitals:   07/30/14 0839  BP: 163/58  Pulse: 86  Temp: 98.6 F (37 C)  Resp: 18   Filed Weights   07/30/14 0839  Weight: 148 lb 12.8 oz (67.495 kg)    GENERAL:alert, no distress and comfortable SKIN: skin color, texture, turgor are normal, no rashes or significant lesions EYES: normal, Conjunctiva are pink and non-injected, sclera clear OROPHARYNX:no exudate, no erythema and lips, buccal mucosa, and tongue normal  NECK: supple, thyroid normal size, non-tender, without nodularity LYMPH:  no palpable lymphadenopathy in the cervical, axillary or inguinal LUNGS: clear to auscultation and percussion with normal breathing effort HEART: regular rate & rhythm and no murmurs and no lower extremity edema ABDOMEN:abdomen soft, non-tender and normal bowel sounds Musculoskeletal:no cyanosis of digits and no clubbing  NEURO: alert & oriented x 3 with fluent speech, no focal motor/sensory deficits  LABORATORY DATA:  I have reviewed the data as listed CBC Latest Ref Rng 07/23/2014 01/29/2014 10/16/2012  WBC 3.9 - 10.3 10e3/uL 4.7 5.7 7.4  Hemoglobin 11.6 - 15.9 g/dL 11.1(L) 12.3 13.2  Hematocrit 34.8 - 46.6 % 33.2(L) 37.9 38.3  Platelets 145 - 400 10e3/uL 184 200 194    CMP Latest Ref Rng 07/23/2014 01/29/2014 10/16/2012  Glucose 70 - 140 mg/dl 138 123 133(H)  BUN 7.0 - 26.0 mg/dL 7.3 9.6 11  Creatinine 0.6 - 1.1 mg/dL 0.6 0.8 0.54  Sodium 136 - 145 mEq/L 137 135(L) 135  Potassium 3.5 - 5.1 mEq/L 3.9 4.2 4.4  Chloride 96 - 112 mEq/L - - 97  CO2 22 - 29 mEq/L 23 21(L) 26  Calcium 8.4 - 10.4 mg/dL 9.4 10.0 10.2  Total Protein 6.4 - 8.3 g/dL 6.4 6.9 -  Total Bilirubin 0.20 - 1.20 mg/dL 0.52 0.68 -  Alkaline Phos 40 - 150 U/L 54 56 -  AST 5 - 34 U/L 15 24 -  ALT 0 - 55 U/L 19 33 -   Kappa/lambda light chains  Status: Finalresult Visible  to patient:  Not Released Nextappt: None Dx:  MGUS (monoclonal gammopathy of unknow...            Ref Range 7d ago    Kappa free light chain 0.33 - 1.94 mg/dL 3.22 (H)   Lambda Free Lght Chn 0.57 - 2.63 mg/dL 0.90   Kappa:Lambda Ratio 0.26 - 1.65  3.58 (H)        SPEP & IFE with QIG  Status: Finalresult Visible to patient:  Not Released Nextappt: None Dx:  MGUS (monoclonal gammopathy of unknow...              Ref Range 7d ago  21mo ago  39yr ago     IgG (Immunoglobin G), Serum 690 - 1700 mg/dL 215 (L) 232 (L) 346 (L)    IgA 69 - 380 mg/dL 243 289 295    IgM, Serum 52 - 322 mg/dL 816 (H) 885 (H) 977 (H)        Comments:  Monoclonal IgM kappa protein is present.Area of slightly restricted mobility in the IgG and Lambda lanes. A restricted band consistent with monoclonal protein is present.The monoclonal protein peak accounts for 0.5 g/dL of the total0.7 g/dL of protein in the gamma region.  Outside lab on 01/08/2014 Total protein: 6.6 Alpha-1 globulin 0.2 Alpha-2 globulin 0.7 Beta globulin 1.0 Gamma globulin 0.7 M-spike 0.5 g/dl Ig G 190 Ig E 10 Ig M 671 (40-230) Ig A 58    RADIOGRAPHIC STUDIES: No new scans  ASSESSMENT & PLAN:  69 yo female with history of RA and vasculitis, here for follow up her monoclonal IgM MGUS.   1. IgM MGUS -Her recent labs showed stable M protein level at 0.5g/dl, and stable IgM and light chain levels. Her CMP is normal, she does not have any bone pain or other clinical symptoms. I think overall her disease is stable. -She has intermittent mild anemia. She states she had history of B12 deficiency, and has been off B12 oral supplement for several months, her anemia is unlikely related to MGUS. -We'll continue monitoring with lab.  2. Anemia -I'll check her iron studies, D62 and folic acid level today. -I recommend her to restart her oral B12 supplements. -She is scheduled to see her primary care  physician in 3 months for follow-up.  She'll continue follow-up with her primary care physician. But Dr. Minna Antis is leaving the practice, she will see nurse practitioner in 3 months.  Follow-up return to clinic in 6 months with lab CMP, CBC, SPEP with immunofixation, and serum light chain level.   All questions were answered. The patient knows to call the clinic with any problems, questions or concerns. No barriers to learning was detected.  I spent 15 minutes counseling the patient face to face. The total time spent in the appointment was 25 minutes and more than 50% was on counseling and review of test results     Truitt Merle, MD 07/30/2014 8:41 AM

## 2014-07-30 NOTE — Telephone Encounter (Signed)
per pof to sch pt appt-gave pt copy of [place] °

## 2014-07-30 NOTE — Addendum Note (Signed)
Addended by: Truitt Merle on: 07/30/2014 11:45 AM   Modules accepted: Orders

## 2014-08-01 LAB — FOLATE RBC: RBC FOLATE: 1098 ng/mL (ref >280)

## 2014-08-01 LAB — METHYLMALONIC ACID, SERUM: Methylmalonic Acid, Quant: 68 nmol/L — ABNORMAL LOW (ref 87–318)

## 2014-09-09 ENCOUNTER — Other Ambulatory Visit: Payer: Self-pay

## 2014-10-04 ENCOUNTER — Telehealth: Payer: Self-pay | Admitting: Hematology

## 2014-10-04 NOTE — Telephone Encounter (Signed)
pt cld to CX appt-stated she will call back to r/s at a late time

## 2015-01-27 ENCOUNTER — Other Ambulatory Visit: Payer: Medicare Other

## 2015-02-03 ENCOUNTER — Ambulatory Visit: Payer: Medicare Other | Admitting: Hematology

## 2015-04-08 ENCOUNTER — Encounter (HOSPITAL_COMMUNITY): Payer: Self-pay | Admitting: *Deleted

## 2015-04-08 DIAGNOSIS — M310 Hypersensitivity angiitis: Principal | ICD-10-CM | POA: Diagnosis present

## 2015-04-08 DIAGNOSIS — Z882 Allergy status to sulfonamides status: Secondary | ICD-10-CM

## 2015-04-08 DIAGNOSIS — T380X5A Adverse effect of glucocorticoids and synthetic analogues, initial encounter: Secondary | ICD-10-CM | POA: Diagnosis present

## 2015-04-08 DIAGNOSIS — Z794 Long term (current) use of insulin: Secondary | ICD-10-CM

## 2015-04-08 DIAGNOSIS — Z7984 Long term (current) use of oral hypoglycemic drugs: Secondary | ICD-10-CM

## 2015-04-08 DIAGNOSIS — D649 Anemia, unspecified: Secondary | ICD-10-CM | POA: Diagnosis present

## 2015-04-08 DIAGNOSIS — R51 Headache: Secondary | ICD-10-CM | POA: Diagnosis present

## 2015-04-08 DIAGNOSIS — E871 Hypo-osmolality and hyponatremia: Secondary | ICD-10-CM | POA: Diagnosis present

## 2015-04-08 DIAGNOSIS — Z66 Do not resuscitate: Secondary | ICD-10-CM | POA: Diagnosis present

## 2015-04-08 DIAGNOSIS — M069 Rheumatoid arthritis, unspecified: Secondary | ICD-10-CM | POA: Diagnosis present

## 2015-04-08 DIAGNOSIS — Z791 Long term (current) use of non-steroidal anti-inflammatories (NSAID): Secondary | ICD-10-CM

## 2015-04-08 DIAGNOSIS — E1165 Type 2 diabetes mellitus with hyperglycemia: Secondary | ICD-10-CM | POA: Diagnosis present

## 2015-04-08 DIAGNOSIS — J9811 Atelectasis: Secondary | ICD-10-CM | POA: Diagnosis present

## 2015-04-08 DIAGNOSIS — D72828 Other elevated white blood cell count: Secondary | ICD-10-CM | POA: Diagnosis present

## 2015-04-08 DIAGNOSIS — D472 Monoclonal gammopathy: Secondary | ICD-10-CM | POA: Diagnosis present

## 2015-04-08 DIAGNOSIS — M79604 Pain in right leg: Secondary | ICD-10-CM | POA: Diagnosis not present

## 2015-04-08 DIAGNOSIS — Z881 Allergy status to other antibiotic agents status: Secondary | ICD-10-CM

## 2015-04-08 DIAGNOSIS — I119 Hypertensive heart disease without heart failure: Secondary | ICD-10-CM | POA: Diagnosis present

## 2015-04-08 DIAGNOSIS — Z7982 Long term (current) use of aspirin: Secondary | ICD-10-CM

## 2015-04-08 DIAGNOSIS — Z79899 Other long term (current) drug therapy: Secondary | ICD-10-CM

## 2015-04-08 DIAGNOSIS — E039 Hypothyroidism, unspecified: Secondary | ICD-10-CM | POA: Diagnosis present

## 2015-04-08 NOTE — ED Notes (Signed)
Pt states she was diagnosed with vasculitis years ago. States it broke out really bad Saturday and Urgent Care placed her on antibiotics.  States that she vasculitis has not gotten any better and she is having a hard time ambulating.

## 2015-04-09 ENCOUNTER — Inpatient Hospital Stay (HOSPITAL_COMMUNITY)
Admission: EM | Admit: 2015-04-09 | Discharge: 2015-04-11 | DRG: 546 | Disposition: A | Discharge status: Home Health | Payer: Medicare Other | Attending: Internal Medicine | Admitting: Internal Medicine

## 2015-04-09 ENCOUNTER — Encounter (HOSPITAL_COMMUNITY): Payer: Self-pay | Admitting: Internal Medicine

## 2015-04-09 ENCOUNTER — Inpatient Hospital Stay (HOSPITAL_COMMUNITY): Payer: Medicare Other

## 2015-04-09 DIAGNOSIS — E871 Hypo-osmolality and hyponatremia: Secondary | ICD-10-CM

## 2015-04-09 DIAGNOSIS — Z7982 Long term (current) use of aspirin: Secondary | ICD-10-CM | POA: Diagnosis not present

## 2015-04-09 DIAGNOSIS — M069 Rheumatoid arthritis, unspecified: Secondary | ICD-10-CM | POA: Diagnosis present

## 2015-04-09 DIAGNOSIS — E118 Type 2 diabetes mellitus with unspecified complications: Secondary | ICD-10-CM | POA: Diagnosis not present

## 2015-04-09 DIAGNOSIS — Z7984 Long term (current) use of oral hypoglycemic drugs: Secondary | ICD-10-CM | POA: Diagnosis not present

## 2015-04-09 DIAGNOSIS — E1165 Type 2 diabetes mellitus with hyperglycemia: Secondary | ICD-10-CM | POA: Diagnosis present

## 2015-04-09 DIAGNOSIS — Z881 Allergy status to other antibiotic agents status: Secondary | ICD-10-CM | POA: Diagnosis not present

## 2015-04-09 DIAGNOSIS — I776 Arteritis, unspecified: Secondary | ICD-10-CM

## 2015-04-09 DIAGNOSIS — M79604 Pain in right leg: Secondary | ICD-10-CM | POA: Diagnosis present

## 2015-04-09 DIAGNOSIS — J9811 Atelectasis: Secondary | ICD-10-CM | POA: Diagnosis present

## 2015-04-09 DIAGNOSIS — M310 Hypersensitivity angiitis: Secondary | ICD-10-CM | POA: Diagnosis present

## 2015-04-09 DIAGNOSIS — Z794 Long term (current) use of insulin: Secondary | ICD-10-CM | POA: Diagnosis not present

## 2015-04-09 DIAGNOSIS — Z79899 Other long term (current) drug therapy: Secondary | ICD-10-CM | POA: Diagnosis not present

## 2015-04-09 DIAGNOSIS — R51 Headache: Secondary | ICD-10-CM | POA: Diagnosis present

## 2015-04-09 DIAGNOSIS — R05 Cough: Secondary | ICD-10-CM

## 2015-04-09 DIAGNOSIS — Z791 Long term (current) use of non-steroidal anti-inflammatories (NSAID): Secondary | ICD-10-CM | POA: Diagnosis not present

## 2015-04-09 DIAGNOSIS — D649 Anemia, unspecified: Secondary | ICD-10-CM | POA: Diagnosis present

## 2015-04-09 DIAGNOSIS — D472 Monoclonal gammopathy: Secondary | ICD-10-CM

## 2015-04-09 DIAGNOSIS — IMO0002 Reserved for concepts with insufficient information to code with codable children: Secondary | ICD-10-CM | POA: Diagnosis present

## 2015-04-09 DIAGNOSIS — R29898 Other symptoms and signs involving the musculoskeletal system: Secondary | ICD-10-CM

## 2015-04-09 DIAGNOSIS — D72828 Other elevated white blood cell count: Secondary | ICD-10-CM | POA: Diagnosis present

## 2015-04-09 DIAGNOSIS — Z66 Do not resuscitate: Secondary | ICD-10-CM | POA: Diagnosis present

## 2015-04-09 DIAGNOSIS — Z882 Allergy status to sulfonamides status: Secondary | ICD-10-CM | POA: Diagnosis not present

## 2015-04-09 DIAGNOSIS — E039 Hypothyroidism, unspecified: Secondary | ICD-10-CM | POA: Diagnosis present

## 2015-04-09 DIAGNOSIS — R059 Cough, unspecified: Secondary | ICD-10-CM

## 2015-04-09 DIAGNOSIS — I119 Hypertensive heart disease without heart failure: Secondary | ICD-10-CM | POA: Diagnosis present

## 2015-04-09 DIAGNOSIS — T380X5A Adverse effect of glucocorticoids and synthetic analogues, initial encounter: Secondary | ICD-10-CM | POA: Diagnosis present

## 2015-04-09 HISTORY — DX: Disorder of thyroid, unspecified: E07.9

## 2015-04-09 HISTORY — DX: Essential (primary) hypertension: I10

## 2015-04-09 HISTORY — DX: Arteritis, unspecified: I77.6

## 2015-04-09 LAB — CBC WITH DIFFERENTIAL/PLATELET
BASOS ABS: 0 10*3/uL (ref 0.0–0.1)
BASOS PCT: 0 %
BASOS PCT: 0 %
Basophils Absolute: 0 10*3/uL (ref 0.0–0.1)
EOS PCT: 0 %
Eosinophils Absolute: 0 10*3/uL (ref 0.0–0.7)
Eosinophils Absolute: 0 10*3/uL (ref 0.0–0.7)
Eosinophils Relative: 0 %
HCT: 29.9 % — ABNORMAL LOW (ref 36.0–46.0)
HEMATOCRIT: 31.5 % — AB (ref 36.0–46.0)
HEMOGLOBIN: 10.2 g/dL — AB (ref 12.0–15.0)
HEMOGLOBIN: 10.8 g/dL — AB (ref 12.0–15.0)
LYMPHS ABS: 1 10*3/uL (ref 0.7–4.0)
LYMPHS PCT: 12 %
Lymphocytes Relative: 11 %
Lymphs Abs: 1 10*3/uL (ref 0.7–4.0)
MCH: 27.7 pg (ref 26.0–34.0)
MCH: 27.8 pg (ref 26.0–34.0)
MCHC: 34.1 g/dL (ref 30.0–36.0)
MCHC: 34.3 g/dL (ref 30.0–36.0)
MCV: 81.3 fL (ref 78.0–100.0)
MCV: 81 fL (ref 78.0–100.0)
MONO ABS: 0.5 10*3/uL (ref 0.1–1.0)
MONOS PCT: 5 %
Monocytes Absolute: 0.4 10*3/uL (ref 0.1–1.0)
Monocytes Relative: 5 %
NEUTROS ABS: 7.1 10*3/uL (ref 1.7–7.7)
NEUTROS PCT: 85 %
NEUTROS PCT: 83 %
Neutro Abs: 8 10*3/uL — ABNORMAL HIGH (ref 1.7–7.7)
Platelets: 216 10*3/uL (ref 150–400)
Platelets: 231 10*3/uL (ref 150–400)
RBC: 3.68 MIL/uL — ABNORMAL LOW (ref 3.87–5.11)
RBC: 3.89 MIL/uL (ref 3.87–5.11)
RDW: 13.4 % (ref 11.5–15.5)
RDW: 13.5 % (ref 11.5–15.5)
WBC: 9.5 10*3/uL (ref 4.0–10.5)
WBC: 8.7 10*3/uL (ref 4.0–10.5)

## 2015-04-09 LAB — BASIC METABOLIC PANEL
ANION GAP: 13 (ref 5–15)
Anion gap: 11 (ref 5–15)
Anion gap: 12 (ref 5–15)
BUN: 11 mg/dL (ref 6–20)
BUN: 14 mg/dL (ref 6–20)
BUN: 12 mg/dL (ref 6–20)
CALCIUM: 9.6 mg/dL (ref 8.9–10.3)
CALCIUM: 9.6 mg/dL (ref 8.9–10.3)
CALCIUM: 9.7 mg/dL (ref 8.9–10.3)
CO2: 23 mmol/L (ref 22–32)
CO2: 23 mmol/L (ref 22–32)
CO2: 24 mmol/L (ref 22–32)
CREATININE: 0.49 mg/dL (ref 0.44–1.00)
CREATININE: 0.56 mg/dL (ref 0.44–1.00)
Chloride: 93 mmol/L — ABNORMAL LOW (ref 101–111)
Chloride: 96 mmol/L — ABNORMAL LOW (ref 101–111)
Chloride: 94 mmol/L — ABNORMAL LOW (ref 101–111)
Creatinine, Ser: 0.6 mg/dL (ref 0.44–1.00)
GFR calc Af Amer: >60 mL/min (ref >60)
GFR calc Af Amer: >60 mL/min (ref >60)
GFR calc Af Amer: >60 mL/min (ref >60)
GLUCOSE: 140 mg/dL — AB (ref 65–99)
GLUCOSE: 229 mg/dL — AB (ref 65–99)
GLUCOSE: 225 mg/dL — AB (ref 65–99)
Potassium: 4.1 mmol/L (ref 3.5–5.1)
Potassium: 4.2 mmol/L (ref 3.5–5.1)
Potassium: 4.2 mmol/L (ref 3.5–5.1)
Sodium: 129 mmol/L — ABNORMAL LOW (ref 135–145)
Sodium: 130 mmol/L — ABNORMAL LOW (ref 135–145)
Sodium: 130 mmol/L — ABNORMAL LOW (ref 135–145)

## 2015-04-09 LAB — COMPREHENSIVE METABOLIC PANEL
ALK PHOS: 65 U/L (ref 38–126)
ALT: 39 U/L (ref 14–54)
ANION GAP: 13 (ref 5–15)
AST: 37 U/L (ref 15–41)
Albumin: 3.5 g/dL (ref 3.5–5.0)
BUN: 12 mg/dL (ref 6–20)
CALCIUM: 9.3 mg/dL (ref 8.9–10.3)
CO2: 23 mmol/L (ref 22–32)
Chloride: 86 mmol/L — ABNORMAL LOW (ref 101–111)
Creatinine, Ser: 0.56 mg/dL (ref 0.44–1.00)
GFR calc non Af Amer: >60 mL/min (ref >60)
Glucose, Bld: 199 mg/dL — ABNORMAL HIGH (ref 65–99)
POTASSIUM: 4.1 mmol/L (ref 3.5–5.1)
SODIUM: 122 mmol/L — AB (ref 135–145)
TOTAL PROTEIN: 7.2 g/dL (ref 6.5–8.1)
Total Bilirubin: 0.9 mg/dL (ref 0.3–1.2)

## 2015-04-09 LAB — URINE MICROSCOPIC-ADD ON

## 2015-04-09 LAB — URINALYSIS, ROUTINE W REFLEX MICROSCOPIC
Bilirubin Urine: NEGATIVE
Glucose, UA: 100 mg/dL — AB
KETONES UR: NEGATIVE mg/dL
NITRITE: NEGATIVE
Specific Gravity, Urine: 1.016 (ref 1.005–1.030)
pH: 7 (ref 5.0–8.0)

## 2015-04-09 LAB — GLUCOSE, CAPILLARY
GLUCOSE-CAPILLARY: 246 mg/dL — AB (ref 65–99)
GLUCOSE-CAPILLARY: 220 mg/dL — AB (ref 65–99)
Glucose-Capillary: 135 mg/dL — ABNORMAL HIGH (ref 65–99)
Glucose-Capillary: 206 mg/dL — ABNORMAL HIGH (ref 65–99)

## 2015-04-09 LAB — OSMOLALITY, URINE: Osmolality, Ur: 275 mOsm/kg — ABNORMAL LOW (ref 300–900)

## 2015-04-09 LAB — CORTISOL: Cortisol, Plasma: 9.6 ug/dL

## 2015-04-09 LAB — SODIUM, URINE, RANDOM: Sodium, Ur: 64 mmol/L

## 2015-04-09 LAB — SEDIMENTATION RATE: Sed Rate: 121 mm/hr — ABNORMAL HIGH (ref 0–22)

## 2015-04-09 LAB — C-REACTIVE PROTEIN: CRP: 11.1 mg/dL — AB (ref <1.0)

## 2015-04-09 LAB — TSH: TSH: 12.438 u[IU]/mL — AB (ref 0.350–4.500)

## 2015-04-09 LAB — T4, FREE: Free T4: 1.11 ng/dL (ref 0.61–1.12)

## 2015-04-09 MED ORDER — MORPHINE SULFATE (PF) 4 MG/ML IV SOLN
4.0000 mg | Freq: Once | INTRAVENOUS | Status: AC
  Administered 2015-04-09: 4 mg via INTRAVENOUS
  Filled 2015-04-09: qty 1

## 2015-04-09 MED ORDER — CARVEDILOL 12.5 MG PO TABS
12.5000 mg | ORAL_TABLET | Freq: Two times a day (BID) | ORAL | Status: DC
  Administered 2015-04-09 – 2015-04-11 (×5): 12.5 mg via ORAL
  Filled 2015-04-09 (×5): qty 1

## 2015-04-09 MED ORDER — METHYLPREDNISOLONE SODIUM SUCC 125 MG IJ SOLR
60.0000 mg | Freq: Every day | INTRAMUSCULAR | Status: DC
  Administered 2015-04-09 – 2015-04-10 (×2): 60 mg via INTRAVENOUS
  Filled 2015-04-09 (×2): qty 2

## 2015-04-09 MED ORDER — ONDANSETRON HCL 4 MG PO TABS
4.0000 mg | ORAL_TABLET | Freq: Four times a day (QID) | ORAL | Status: DC | PRN
  Administered 2015-04-10: 4 mg via ORAL
  Filled 2015-04-09: qty 1

## 2015-04-09 MED ORDER — LEVOTHYROXINE SODIUM 75 MCG PO TABS
75.0000 ug | ORAL_TABLET | ORAL | Status: DC
  Administered 2015-04-09 – 2015-04-11 (×3): 75 ug via ORAL
  Filled 2015-04-09 (×3): qty 1

## 2015-04-09 MED ORDER — LORAZEPAM 2 MG/ML IJ SOLN
1.0000 mg | Freq: Once | INTRAMUSCULAR | Status: AC
  Administered 2015-04-09: 1 mg via INTRAVENOUS
  Filled 2015-04-09: qty 1

## 2015-04-09 MED ORDER — ONDANSETRON HCL 4 MG/2ML IJ SOLN
4.0000 mg | Freq: Four times a day (QID) | INTRAMUSCULAR | Status: DC | PRN
  Administered 2015-04-10: 4 mg via INTRAVENOUS
  Filled 2015-04-09: qty 2

## 2015-04-09 MED ORDER — AMLODIPINE BESYLATE 5 MG PO TABS
5.0000 mg | ORAL_TABLET | Freq: Every day | ORAL | Status: DC
  Administered 2015-04-09 – 2015-04-11 (×3): 5 mg via ORAL
  Filled 2015-04-09 (×3): qty 1

## 2015-04-09 MED ORDER — ACETAMINOPHEN 325 MG PO TABS
650.0000 mg | ORAL_TABLET | Freq: Four times a day (QID) | ORAL | Status: DC | PRN
  Administered 2015-04-09 – 2015-04-10 (×3): 650 mg via ORAL
  Filled 2015-04-09 (×3): qty 2

## 2015-04-09 MED ORDER — SODIUM CHLORIDE 0.9 % IV SOLN
INTRAVENOUS | Status: AC
  Administered 2015-04-09: 75 mL/h via INTRAVENOUS

## 2015-04-09 MED ORDER — OMEGA-3-ACID ETHYL ESTERS 1 G PO CAPS
1.0000 g | ORAL_CAPSULE | Freq: Every day | ORAL | Status: DC
  Administered 2015-04-09 – 2015-04-11 (×3): 1 g via ORAL
  Filled 2015-04-09 (×3): qty 1

## 2015-04-09 MED ORDER — GADOBENATE DIMEGLUMINE 529 MG/ML IV SOLN
15.0000 mL | Freq: Once | INTRAVENOUS | Status: AC | PRN
  Administered 2015-04-09: 13 mL via INTRAVENOUS

## 2015-04-09 MED ORDER — INSULIN GLARGINE 100 UNIT/ML ~~LOC~~ SOLN
5.0000 [IU] | Freq: Every day | SUBCUTANEOUS | Status: DC
  Administered 2015-04-09 – 2015-04-11 (×3): 5 [IU] via SUBCUTANEOUS
  Filled 2015-04-09 (×4): qty 0.05

## 2015-04-09 MED ORDER — VITAMIN B-12 1000 MCG PO TABS
2000.0000 ug | ORAL_TABLET | Freq: Every day | ORAL | Status: DC
  Administered 2015-04-09 – 2015-04-11 (×3): 2000 ug via ORAL
  Filled 2015-04-09 (×3): qty 2

## 2015-04-09 MED ORDER — HYDRALAZINE HCL 20 MG/ML IJ SOLN
10.0000 mg | INTRAMUSCULAR | Status: DC | PRN
  Administered 2015-04-10 – 2015-04-11 (×3): 10 mg via INTRAVENOUS
  Filled 2015-04-09 (×4): qty 1

## 2015-04-09 MED ORDER — PANTOPRAZOLE SODIUM 40 MG IV SOLR
40.0000 mg | INTRAVENOUS | Status: DC
  Administered 2015-04-09 – 2015-04-10 (×2): 40 mg via INTRAVENOUS
  Filled 2015-04-09 (×2): qty 40

## 2015-04-09 MED ORDER — INSULIN ASPART 100 UNIT/ML ~~LOC~~ SOLN
0.0000 [IU] | Freq: Three times a day (TID) | SUBCUTANEOUS | Status: DC
  Administered 2015-04-09: 1 [IU] via SUBCUTANEOUS
  Administered 2015-04-09 (×2): 3 [IU] via SUBCUTANEOUS
  Administered 2015-04-10: 2 [IU] via SUBCUTANEOUS
  Administered 2015-04-10: 3 [IU] via SUBCUTANEOUS
  Administered 2015-04-10 – 2015-04-11 (×2): 2 [IU] via SUBCUTANEOUS

## 2015-04-09 MED ORDER — LEVOTHYROXINE SODIUM 75 MCG PO TABS: 150.0000 ug | ORAL_TABLET | ORAL | Status: DC

## 2015-04-09 MED ORDER — ACETAMINOPHEN 650 MG RE SUPP: 650.0000 mg | Freq: Four times a day (QID) | RECTAL | Status: DC | PRN

## 2015-04-09 MED ORDER — MORPHINE SULFATE (PF) 2 MG/ML IV SOLN
1.0000 mg | INTRAVENOUS | Status: DC | PRN
  Administered 2015-04-09 – 2015-04-10 (×2): 1 mg via INTRAVENOUS
  Filled 2015-04-09 (×2): qty 1

## 2015-04-09 NOTE — H&P (Signed)
[facility] History and Physical  Tisha Stolar [id number] DOB: [birth year] DOA: 04/09/2015  Referring physician: Dr. Claudine Mouton. PCP: Tommy Medal, MD  Specialists: Dr.Fang.Oncologist.  Chief Complaint: Rash and lower extremity weakness.  HPI: Emily Kennedy is a 70 y.o. 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 Patient also has been having lower extremity weakness over the last couple of days. Denies any incontinence of urine or bowels. Denies any fall. Patient has been having some subjective feeling of chills. Patient has history of leukocytoclastic vasculitis. Patient states she had gone to urgent care and was prescribed antibiotics for the rash and has been to eat for last 1 week despite which the rash has not improved. Patient also has been having significant pain. Pain starts in the low back and involves both lower extremity. Patient's blood pressure also has been not well controlled and was initially started on clonidine last month by patient's PCP but patient states after she took that she became confused and last week had tapered off the medication. On exam patient has significant weakness of the lower extremities and low back pain. Forward flexes of the dependent and has a rash in the both lower extremity.   Review of Systems: As presented in the history of presenting illness, rest negative.  Past Medical History  Diagnosis Date  . Diabetes mellitus   . Vasculitis ([place])   . Hypertension   . Thyroid disease    Past Surgical History  Procedure Laterality Date  . Tubal ligation    . Melanoma excision     Social History:  reports that she has never smoked. She has never used smokeless tobacco. She reports that she does not drink alcohol or use illicit drugs. Where  does patient live home. Can patient participate in ADLs? Yes.  Allergies  Allergen Reactions  . Ciprofloxacin     Unable to recall  . Nitrofurantoin Monohyd Macro Hives  . Septra [Bactrim] Hives  . Sulfa Drugs Cross Reactors Hives  . Ceclor [Cefaclor] Rash    Breaks out in rash chest down.    Family History:  Family History  Problem Relation Age of Onset  . Cancer Sister       Prior to Admission medications   Medication Sig Start Date End Date Taking? Authorizing Provider  acyclovir (ZOVIRAX) 400 MG tablet Take 400 mg by mouth as needed (for break outs).    Yes Historical Provider, MD  amoxicillin-clavulanate (AUGMENTIN) 875-125 MG tablet Take 1 tablet by mouth 2 (two) times daily.   Yes Historical Provider, MD  aspirin EC 81 MG tablet Take 81 mg by mouth daily.     Yes Historical Provider, MD  carvedilol (COREG) 12.5 MG tablet Take 12.5 mg by mouth 2 (two) times daily with a meal.   Yes Historical Provider, MD  fish oil-omega-3 fatty acids 1000 MG capsule Take 1 g by mouth daily.     Yes Historical Provider, MD  levothyroxine (SYNTHROID, LEVOTHROID) 75 MCG tablet Take 75-150 mcg by mouth daily. Take 2 tablets on Sunday and then take 1 tablet the other days   Yes Historical Provider, MD  metFORMIN (GLUCOPHAGE) 1000 MG tablet Take 1,000 mg by mouth 2 (two) times daily with a meal.    Yes Historical Provider, MD  naproxen (NAPROSYN) 500 MG tablet Take 500 mg by mouth 2 (two)  times daily with a meal.   Yes Historical Provider, MD  vitamin B-12 (CYANOCOBALAMIN) 1000 MCG tablet Take 2,000 mcg by mouth daily.    Yes Historical Provider, MD    Physical Exam: Filed Vitals:   04/09/15 0300 04/09/15 0330 04/09/15 0400 04/09/15 0448  BP: 183/83 175/72 170/77 186/97  Pulse: 77 76 73 79  Temp:    97.5 F (36.4 C)  TempSrc:    Oral  Resp:    18  Height:    5\' 4"  (1.626 m)  Weight:    64.592 kg (142 lb 6.4 oz)  SpO2: 95% 96% 98% 97%     General:  Moderately built and  nourished.  Eyes: No discharge from the ears eyes nose or mouth.  ENT: No discharge from the ears eyes nose or mouth.  Neck: No mass felt. No neck rigidity.  Cardiovascular: S1-S2 heard.  Respiratory: No rhonchi or crepitations.  Abdomen: Soft nontender bowel sounds present. No guarding or rigidity.  Skin: Bilateral lower extremity rash.   Musculoskeletal: Mild edema of the lower extremity.  Psychiatric: Appears normal.  Neurologic: Alert awake oriented to time place and person. Patient has weakness of the lower extremity but is able to move all extremities. Patient's lower extremity is weak and also hurting or moving. No weakness of the upper extremity. Poor deep tendon reflexes.  Labs on Admission:  Basic Metabolic Panel:  Recent Labs Lab 04/09/15 0213  NA 122*  K 4.1  CL 86*  CO2 23  GLUCOSE 199*  BUN 12  CREATININE 0.56  CALCIUM 9.3   Liver Function Tests:  Recent Labs Lab 04/09/15 0213  AST 37  ALT 39  ALKPHOS 65  BILITOT 0.9  PROT 7.2  ALBUMIN 3.5   No results for input(s): LIPASE, AMYLASE in the last 168 hours. No results for input(s): AMMONIA in the last 168 hours. CBC:  Recent Labs Lab 04/09/15 0213  WBC 9.5  NEUTROABS 8.0*  HGB 10.2*  HCT 29.9*  MCV 81.3  PLT 216   Cardiac Enzymes: No results for input(s): CKTOTAL, CKMB, CKMBINDEX, TROPONINI in the last 168 hours.  BNP (last 3 results) No results for input(s): BNP in the last 8760 hours.  ProBNP (last 3 results) No results for input(s): PROBNP in the last 8760 hours.  CBG: No results for input(s): GLUCAP in the last 168 hours.  Radiological Exams on Admission: No results found.   Assessment/Plan Principal Problem:   Lower extremity weakness Active Problems:   MGUS (monoclonal gammopathy of unknown significance)   Vasculitis (HCC)   Hyponatremia   Diabetes mellitus type 2, uncontrolled ([place])   1. Bilateral lower extremity weakness with low back pain - given the history  of vasculitis at this time concerning for any spinal lesions. I have discussed with on-call neurologist Dr. Janann Colonel who at this time is advised to get MRI with and without contrast of the lumbar and thoracic spine following which to reconsult neurology. Patient has been placed on morphine for pain. Will also get x-ray of the pelvis. Physical therapy consult. 2. Leukocytoclastic vasculitis - patient's lower extremity rash is consistent with leukocytoclastic vasculitis. Patient's CRP levels elevated. At this time I have placed patient on Solu-Medrol 60 mg IV daily. May discuss with oncologist in a.m. 3. Hyponatremia - suspect poor oral intake. At this time I have placed patient on normal saline at 75 mL per hour. I have ordered urine sodium and Osmolality. Will also check TSH and cortisol levels. Based on the  frequent B med check and urine studies we will have further plans on the hyponatremia. 4. Diabetes mellitus type 2 uncontrolled - patient at this time will be placed on sliding scale coverage. Patient is also on steroids so patient probably will require long-acting insulin for which I have placed patient on Lantus 5 units. 5. Hypertension uncontrolled - patient herself enough clonidine recently. I have placed patient on hydralazine along with patient's Coreg. Will add amlodipine. Closely follow blood pressure trends. 6. IgM MGUS - Being followed by oncologist.  7.  chronic anemia  - follow CBC.  8.  hypothyroidism on Synthroid.   DVT ProphylaxisSCDs for now until we get MRI results. Code Status: DO NOT RESUSCITATE.  Family Communication: Patient's daughter at the bedside.  Disposition Plan: Admit to inpatient.    Yusuke Beza N. [facility] Pager [phone].  If 7PM-7AM, please contact night-coverage [email or web address] Password [facility] 04/09/2015, 5:58 AM

## 2015-04-09 NOTE — ED Notes (Signed)
Dr. Oni at the bedside.  

## 2015-04-09 NOTE — Progress Notes (Signed)
UR Completed. Roger Fasnacht, RN, BSN.  [phone] 

## 2015-04-09 NOTE — Progress Notes (Signed)
Physical Therapy Evaluation Patient Details Name: Emily Kennedy MRN: [id number] DOB: July 01, 1945 Today's Date: 04/09/2015   History of Present Illness  pt is a 70 y/o female with h/o DM, vasulitis, HTN, thyroid ds, RA, admitted with increasing, non improving, painful rash on both LE's in conjunction with LE weakness over the last few days.  Clinical Impression  Pt admitted with/for significant LE weakness and rash Bil LE's.  Pt currently limited functionally due to the problems listed. ( See problems list.)   Pt will benefit from PT to maximize function and safety in order to get ready for next venue listed below.     Follow Up Recommendations SNF;Supervision/Assistance - 24 hour    Equipment Recommendations  Other (comment) (TBA in next venue)    Recommendations for Other Services       Precautions / Restrictions Precautions Precautions: Fall      Mobility  Bed Mobility Overal bed mobility: Needs Assistance Bed Mobility: Supine to Sit     Supine to sit: Min assist        Transfers Overall transfer level: Needs assistance Equipment used: None;Rolling walker (2 wheeled) Transfers: Sit to/from [facility] Sit to Stand: Max assist Stand pivot transfers: Max assist          Ambulation/Gait             General Gait Details: Not attempte  Stairs            Wheelchair Mobility    Modified Rankin (Stroke Patients Only)       Balance Overall balance assessment: Needs assistance Sitting-balance support: No upper extremity supported Sitting balance-Leahy Scale: Good     Standing balance support: Bilateral upper extremity supported Standing balance-Leahy Scale: Poor Standing balance comment: Unstable on her LE with heavy use of the RW and maximal assist                             Pertinent Vitals/Pain Pain Assessment: Faces Faces Pain Scale: Hurts even more Pain Location: back with MMT Pain Descriptors /  Indicators: Aching;Sore Pain Intervention(s): Monitored during session;Limited activity within patient's tolerance    Home Living Family/patient expects to be discharged to:: Private residence Living Arrangements: Children (daughter) Available Help at Discharge: Family;Available 24 hours/day Type of Home: House Home Access: Level entry     Home Layout: One level Home Equipment: Cane - single point      Prior Function Level of Independence: Needs assistance   Gait / Transfers Assistance Needed: in the last 4 weeks pt has gotten progressively weaker and last week or more has needed significant assist to ambulate.           Hand Dominance        Extremity/Trunk Assessment   Upper Extremity Assessment: Defer to OT evaluation;Overall [facility] for tasks assessed;Generalized weakness (not formally tested)           Lower Extremity Assessment: RLE deficits/detail;LLE deficits/detail RLE Deficits / Details: profoundly weak, hip flexors 2+/5, hams 3-/5, quads 3/5, pf 2-/5, df 1/5 LLE Deficits / Details: similar to R LE but incrementally stronger quads.     Communication   Communication: No difficulties  Cognition Arousal/Alertness: Awake/alert Behavior During Therapy: WFL for tasks assessed/performed Overall Cognitive Status: Within Functional Limits for tasks assessed (but not a good historian)                      General  Comments      Exercises        Assessment/Plan    PT Assessment Patient needs continued PT services  PT Diagnosis Difficulty walking;Generalized weakness;Acute pain   PT Problem List Decreased strength;Decreased activity tolerance;Decreased balance;Decreased mobility;Decreased coordination;Decreased knowledge of use of DME;Impaired sensation;Pain  PT Treatment Interventions DME instruction;Gait training;Functional mobility training;Therapeutic activities;Balance training;Patient/family education   PT Goals (Current goals can be found in the  Care Plan section) Acute Rehab PT Goals Patient Stated Goal: pt interested in finding out whats going on,. PT Goal Formulation: With patient Time For Goal Achievement: 04/23/15 Potential to Achieve Goals: Fair    Frequency Min 3X/week   Barriers to discharge        Co-evaluation               End of Session   Activity Tolerance: Patient limited by pain;Patient tolerated treatment well Patient left: in bed;with call bell/phone within reach;with family/visitor present (sitting EOB) Nurse Communication: Mobility status         Time: 1240-1320 PT Time Calculation (min) (ACUTE ONLY): 40 min   Charges:   PT Evaluation $PT Eval Moderate Complexity: 1 Procedure PT Treatments $Therapeutic Activity: 23-37 mins   PT G Codes:        Tahara Ruffini, Tessie Fass 04/09/2015, 1:44 PM 04/09/2015  Donnella Sham, PT [phone]  (pager)

## 2015-04-09 NOTE — Progress Notes (Signed)
Patient was found in chair with bed alarm off taking off telemetry box. Pt. Is confused family made aware (son, Elberta Fortis), and patient is reminded to call for assistance. Shirley Muscat 5:41 PM

## 2015-04-09 NOTE — ED Provider Notes (Signed)
CSN: [id number]     Arrival date & time 04/08/15  2343 History  By signing my name below, I, Emily Kennedy, attest that this documentation has been prepared under the direction and in the presence of Everlene Balls, MD. Electronically Signed: Altamease Kennedy, ED Scribe. 04/09/2015. 3:16 AM    Chief Complaint  Patient presents with  . Leg Pain   No language interpreter was used.   Emily Kennedy is a 70 y.o. female with history of leukocytoclastic vasculitis , DM, HTN, and thyroid disease who presents to the Emergency Department complaining of atraumatic, constant, 9/10 in severity,  bilateral foot pain and redness with onset 6 days ago. Pt states that she is unable to walk due to the pain. She associates her symptoms with vasculitis but notes that it has not been this severe in over 15 years. Pt was seen at Urgent Care 3 days ago for her symptoms and was was given abx, prednisone, and naproxen that have provided no relief.  Associated symptoms include back pain and temperature up to 100.7.  Her blood pressure has reportedly been difficult to control, 3 days ago she was weaned off clonidine due to the side effects.   Past Medical History  Diagnosis Date  . Diabetes mellitus   . Vasculitis ([place])   . Hypertension   . Thyroid disease    Past Surgical History  Procedure Laterality Date  . Tubal ligation    . Melanoma excision     Family History  Problem Relation Age of Onset  . Cancer Sister    Social History  Substance Use Topics  . Smoking status: Never Smoker   . Smokeless tobacco: Never Used  . Alcohol Use: No   OB History    No data available     Review of Systems  10 Systems reviewed and all are negative for acute change except as noted in the HPI.  Allergies  Ciprofloxacin; Nitrofurantoin monohyd macro; Septra; Sulfa drugs cross reactors; and Ceclor  Home Medications   Prior to Admission medications   Medication Sig Start Date End Date Taking? Authorizing  Provider  ACCU-CHEK AVIVA PLUS test strip  12/20/13   Historical Provider, MD  ACCU-CHEK SOFTCLIX LANCETS lancets 2 (two) times daily.  12/20/13   Historical Provider, MD  acyclovir (ZOVIRAX) 400 MG tablet Take 400 mg by mouth as needed (for break outs).     Historical Provider, MD  aspirin EC 81 MG tablet Take 81 mg by mouth daily.      Historical Provider, MD  fish oil-omega-3 fatty acids 1000 MG capsule Take 1 g by mouth daily.      Historical Provider, MD  levothyroxine (SYNTHROID, LEVOTHROID) 75 MCG tablet Take 75-150 mcg by mouth daily. Take 2 tablets on Sunday and then take 1 tablet the other days    Historical Provider, MD  losartan (COZAAR) 100 MG tablet Take 100 mg by mouth daily. 07/23/14   Historical Provider, MD  metFORMIN (GLUCOPHAGE) 1000 MG tablet Take 1,000 mg by mouth 2 (two) times daily with a meal.     Historical Provider, MD  Misc Natural Products (GLUCOSAMINE CHONDROITIN ADV PO) Take 1 tablet by mouth 3 (three) times daily.    Historical Provider, MD  Multiple Vitamins-Minerals (PRESERVISION/LUTEIN PO) Take 1 tablet by mouth daily.    Historical Provider, MD  vitamin B-12 (CYANOCOBALAMIN) 1000 MCG tablet Take 1,000 mcg by mouth 2 (two) times a week.    Historical Provider, MD   BP 190/85 mmHg  Pulse 75  Temp(Src) 97.8 F (36.6 C) (Oral)  Resp 18  SpO2 97% Physical Exam  Constitutional: She is oriented to person, place, and time. She appears well-developed and well-nourished. No distress.  HENT:  Head: Normocephalic and atraumatic.  Nose: Nose normal.  Mouth/Throat: Oropharynx is clear and moist. No oropharyngeal exudate.  Eyes: Conjunctivae and EOM are normal. Pupils are equal, round, and reactive to light. No scleral icterus.  Neck: Normal range of motion. Neck supple. No JVD present. No tracheal deviation present. No thyromegaly present.  Cardiovascular: Normal rate, regular rhythm and normal heart sounds.  Exam reveals no gallop and no friction rub.   No murmur  heard. Pulmonary/Chest: Effort normal and breath sounds normal. No respiratory distress. She has no wheezes. She exhibits no tenderness.  Abdominal: Soft. Bowel sounds are normal. She exhibits no distension and no mass. There is no tenderness. There is no rebound and no guarding.  Musculoskeletal: Normal range of motion. She exhibits no edema or tenderness.  Mild warmth around left knee with no TTP  Lymphadenopathy:    She has no cervical adenopathy.  Neurological: She is alert and oriented to person, place, and time. No cranial nerve deficit. She exhibits normal muscle tone.  Skin: Skin is warm and dry. Rash noted. There is erythema. No pallor.  BLE non-blanching erythematous rash that extends from the feet to upper thighs.   Nursing note and vitals reviewed.   ED Course  Procedures (including critical care time) DIAGNOSTIC STUDIES: Oxygen Saturation is 97% on RA,  normal by my interpretation.    COORDINATION OF CARE: 2:04 AM Discussed treatment plan which includes lab work, morphine, and admission to the hospital with pt at bedside and pt agreed to plan.  3:14 AM-Consult complete with Dr. Hal Hope (Hospitalist). Patient case explained and discussed. Agrees to admit patient for further evaluation and treatment. Call ended at 3:16 AM  Labs Review Labs Reviewed  CBC WITH DIFFERENTIAL/PLATELET - Abnormal; Notable for the following:    RBC 3.68 (*)    Hemoglobin 10.2 (*)    HCT 29.9 (*)    Neutro Abs 8.0 (*)    All other components within normal limits  COMPREHENSIVE METABOLIC PANEL - Abnormal; Notable for the following:    Sodium 122 (*)    Chloride 86 (*)    Glucose, Bld 199 (*)    All other components within normal limits  C-REACTIVE PROTEIN - Abnormal; Notable for the following:    CRP 11.1 (*)    All other components within normal limits  URINALYSIS, ROUTINE W REFLEX MICROSCOPIC (NOT AT [facility])  SEDIMENTATION RATE    Imaging Review No results found. I have personally  reviewed and evaluated these images and lab results as part of my medical decision-making.   EKG Interpretation None      MDM   Final diagnoses:  Vasculitis ([place])    patient presents to the emergency department for vasculitis of bilateral lower extremities. She has not had a significant breakout for the past 12 yrs.  It is to the point where she can not walk and was treated at home with prednisone.  She will require admission for further care.  Labs sent, she was given morphine for pain control.     CRP is 11.1. Patient is admitted to telemetry for further care.    I personally performed the services described in this documentation, which was scribed in my presence. The recorded information has been reviewed and is accurate.  Everlene Balls, MD 04/09/15 [phone]

## 2015-04-09 NOTE — Progress Notes (Addendum)
   [facility]                                                                              Patient Demographics  Emily Kennedy, is a 70 y.o. female, DOB - 1945-12-10, [id number]  Admit date - 04/09/2015   Admitting Physician Emily Patience, MD  Outpatient Primary MD for the patient is Emily Medal, MD  LOS - 0   Chief Complaint  Patient presents with  . Leg Pain      HPI On 04/09/2015 by Dr. Gean Birchwood Frady Tyna Kennedy is a 70 y.o. 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 Patient also has been having lower extremity weakness over the last couple of days. Denies any incontinence of urine or bowels. Denies any fall. Patient has been having some subjective feeling of chills. Patient has history of leukocytoclastic vasculitis. Patient states she had gone to urgent care and was prescribed antibiotics for the rash and has been to eat for last 1 week despite which the rash has not improved. Patient also has been having significant pain. Pain starts in the low back and involves both lower extremity. Patient's blood pressure also has been not well controlled and was initially started on clonidine last month by patient's PCP but patient states after she took that she became confused and last week had tapered off the medication. On exam patient has significant weakness of the lower extremities and low back pain. Forward flexes of the dependent and has a rash in the both lower extremity.   Assessment & Plan   Bilateral lower extremity weakness with lower back pain -Patient does have history of vasculitis, concerning for any spinal lesions -Admitting physician spoke with on-call neurologist who recommended MRI with and without contrast of the thoracic and lumbar spine -MRI of  thoracic spine: no significant finding -Physical therapy consulted and appreciated  Leukocytoclastic vasculitis -CRP levels elevated -Continue same measure of 60 mg IV daily  Hyponatremia -Likely secondary to poor oral intake -Continue IVF, monitor BMP -TSH 12.438, will obtain FT4 -Cortisol 9.6 (considered normal for a.m. cortisol level)  Diabetes mellitus, type II, uncontrolled -Continue insulin sliding scale with CBG monitoring -Given use of Solu-Medrol, continue Lantus  Hypertension -Continue Coreg, hydralazine as needed -Amlodipine added  IgM MGUS -Followed by oncology  Chronic anemia -Hb currently 10.8, around baseline -Continue to monitor CBC  Hypothyroidism -TSH 12.438, will obtain FT4 -Continue Synthroid  Code Status: Full  Family Communication: None at bedside  Disposition Plan: Admitted.   Time Spent in minutes   30 minutes  Procedures  None  Consults   Neurosurgery   DVT Prophylaxis  SCDs  Emily Kennedy D.O. on 04/09/2015 at 12:27 PM  Between 7am to 7pm - Pager - [phone]  After 7pm go to [email or web address] - password [email or web address]  And look for the night coverage person covering for me after hours  [facility] Office  [phone]

## 2015-04-10 ENCOUNTER — Inpatient Hospital Stay (HOSPITAL_COMMUNITY): Payer: Medicare Other

## 2015-04-10 LAB — GLUCOSE, CAPILLARY
GLUCOSE-CAPILLARY: 202 mg/dL — AB (ref 65–99)
Glucose-Capillary: 153 mg/dL — ABNORMAL HIGH (ref 65–99)
Glucose-Capillary: 186 mg/dL — ABNORMAL HIGH (ref 65–99)
Glucose-Capillary: 238 mg/dL — ABNORMAL HIGH (ref 65–99)

## 2015-04-10 LAB — BASIC METABOLIC PANEL
ANION GAP: 5 (ref 5–15)
BUN: 15 mg/dL (ref 6–20)
CALCIUM: 9.3 mg/dL (ref 8.9–10.3)
CHLORIDE: 101 mmol/L (ref 101–111)
CO2: 26 mmol/L (ref 22–32)
Creatinine, Ser: 0.58 mg/dL (ref 0.44–1.00)
GFR calc Af Amer: >60 mL/min (ref >60)
GFR calc non Af Amer: >60 mL/min (ref >60)
GLUCOSE: 156 mg/dL — AB (ref 65–99)
Potassium: 3.8 mmol/L (ref 3.5–5.1)
Sodium: 132 mmol/L — ABNORMAL LOW (ref 135–145)

## 2015-04-10 LAB — CBC
HEMATOCRIT: 29.9 % — AB (ref 36.0–46.0)
Hemoglobin: 9.8 g/dL — ABNORMAL LOW (ref 12.0–15.0)
MCH: 26.4 pg (ref 26.0–34.0)
MCHC: 32.8 g/dL (ref 30.0–36.0)
MCV: 80.6 fL (ref 78.0–100.0)
Platelets: 229 10*3/uL (ref 150–400)
RBC: 3.71 MIL/uL — ABNORMAL LOW (ref 3.87–5.11)
RDW: 13.5 % (ref 11.5–15.5)
WBC: 8.3 10*3/uL (ref 4.0–10.5)

## 2015-04-10 MED ORDER — BENZONATATE 100 MG PO CAPS: 100.0000 mg | ORAL_CAPSULE | Freq: Three times a day (TID) | ORAL | Status: DC | PRN

## 2015-04-10 MED ORDER — METHYLPREDNISOLONE SODIUM SUCC 40 MG IJ SOLR
40.0000 mg | Freq: Every day | INTRAMUSCULAR | Status: DC
  Administered 2015-04-11: 40 mg via INTRAVENOUS
  Filled 2015-04-10 (×2): qty 1

## 2015-04-10 MED ORDER — HYDRALAZINE HCL 10 MG PO TABS
10.0000 mg | ORAL_TABLET | Freq: Three times a day (TID) | ORAL | Status: DC
  Administered 2015-04-10 – 2015-04-11 (×3): 10 mg via ORAL
  Filled 2015-04-10 (×3): qty 1

## 2015-04-10 MED ORDER — PANTOPRAZOLE SODIUM 40 MG PO TBEC
40.0000 mg | DELAYED_RELEASE_TABLET | Freq: Every day | ORAL | Status: DC
  Administered 2015-04-11: 40 mg via ORAL
  Filled 2015-04-10: qty 1

## 2015-04-10 NOTE — Clinical Social Work Note (Signed)
Clinical Social Work Assessment  Patient Details  Name: Emily Kennedy MRN: [id number] Date of Birth: 1945-06-19  Date of referral:  04/10/15               Reason for consult:  Facility Placement                Permission sought to share information with:  Facility Sport and exercise psychologist, Family Supports Permission granted to share information::  Yes, Verbal Permission Granted  Name::        Agency::     Relationship::     Contact Information:     Housing/Transportation Living arrangements for the past 2 months:  Single Family Home (lives with daughter Emily Kennedy)) Source of Information:  Adult Children (Emily Kennedy cell number [phone])) Patient Interpreter Needed:  None Criminal Activity/Legal Involvement Pertinent to Current Situation/Hospitalization:  No - Comment as needed Significant Relationships:  Adult Children (one daughter and two sons) Lives with:  Adult Children (daughter ) Do you feel safe going back to the place where you live?  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 Daughter works away from home. PT recommend SNF.   Social Worker assessment / plan:  Engineer, water entered room. Patient was alert and oriented. Patient had two visitors in the room which were her sons. BSW intern explained recommendation for SNF and immediately patient refused SNF. Patient stated that she will be going home when discharge. Patient stated that her daughter Emily Kennedy) is making arrangements to set up advance home care. Patient sons also agreed that they do respect their mother wishes by returning home. Patient son stated that they will give home health a try but if it does not go accordantly then they would place her at a SNF. BSW intern spoke to daughter to get clarity about advance home care.  Daughter stated that the patient stay with her and  between her, her two brothers and home health they will be able to take care of her mom at home rather then patient going to SNF. Patient daughter does understand mother medical treatment and respect her mother wishes in returning home when discharge. BSW intern told the daughter she will leave a list of SNF in the room with patient just in case she change her mind. Daughter stated she will call if she has any more questions or concerns.   Employment status:  Disabled (Comment on whether or not currently receiving Disability) Insurance information:  Medicare PT Recommendations:  [place] / Referral to community resources:  [place]  Patient/Family's Response to care:  Patient and patient children is not agreeable to SNF. They are going with advance home care at home with the support of her children.  Patient/Family's Understanding of and Emotional Response to Diagnosis, Current Treatment, and Prognosis:  Patient and patient children has good insight on patient medical condition and current treatment plan.   Emotional Assessment Appearance:  Appears stated age Attitude/Demeanor/Rapport:   (welcoming friendly) Affect (typically observed):  Calm, Blunt Orientation:  Oriented to Self, Oriented to Place, Oriented to  Time, Oriented to Situation Alcohol / Substance use:  Never Used Psych involvement (Current and /or in the community):  No (Comment)  Discharge Needs  Concerns to be addressed:  No discharge needs identified Readmission within the last 30 days:  No Current discharge risk:  None Barriers to Discharge:  No Barriers Identified   [facility] intern  [phone]

## 2015-04-10 NOTE — Progress Notes (Signed)
Inpatient Diabetes Program Recommendations  AACE/ADA: New Consensus Statement on Inpatient Glycemic Control (2015)  Target Ranges:  Prepandial:   less than 140 mg/dL      Peak postprandial:   less than 180 mg/dL (1-2 hours)      Critically ill patients:  140 - 180 mg/dL   Review of Glycemic Control Results for MARYLEN, KEHS (MRN [id number]) as of 04/10/2015 10:50  Ref. Range 04/09/2015 07:03 04/09/2015 11:31 04/09/2015 17:13 04/09/2015 21:36 04/10/2015 06:20  Glucose-Capillary Latest Ref Range: 65-99 mg/dL 135 (H) 246 (H) 206 (H) 220 (H) 153 (H)    Inpatient Diabetes Program Recommendations:    Fasting glucose is overall controlled. Glucose before dinner and at HS is elevated into 200's.  Please consider increase in correction novolog to moderate tidwc.  Thank you Rosita Kea, RN, MSN, CDE  Diabetes Inpatient Program Office: [phone] Pager: [phone] 8:00 am to 5:00 pm

## 2015-04-10 NOTE — Care Management Note (Addendum)
Case Management Note  Patient Details  Name: Emily Kennedy MRN: [id number] Date of Birth: September 27, 1945  Subjective/Objective:  Pt admitted with lower extremity weakness                  Action/Plan:  Pt is from home with daughter, however daughter works away from the home.  Recommendation has been placed for SNF.  Attending and CM spoke with pt about recommendation, pt is refusing SNF, stated "my daughter will be there and I will be fine".  CM explained safety concerns when daughter has to go to work, pt simply stated "I'm not going anywhere else but home".  Attending also spoke with daughter; daughter is aware of recommendation however agreed to care for pt at home.  CM will arrange [facility] for pt prior to discharge.    Expected Discharge Date:                  Expected Discharge Plan:  [place]  In-House Referral:  Clinical Social Work  Discharge planning Services  CM Consult  Post Acute Care Choice:    Choice offered to:     DME Arranged:    DME Agency:     HH Arranged:    [place] Agency:     Status of Service:  In process, will continue to follow  Medicare Important Message Given:    Date Medicare IM Given:    Medicare IM give by:    Date Additional Medicare IM Given:    Additional Medicare Important Message give by:     If discussed at [place] of Stay Meetings, dates discussed:    Additional Comments: CM attempted to offered choice for [facility] as ordered; pt deferred choice of agency to daughter.  CM spoke with daughter [name] via phone, [facility] was chosen.  CM contacted agency, referral was accepted.  Daughter informed CM that she is in the process of arranging to work from home however she is unsure at the time if it will be approved, CM reiterated that the recommendation is SNF, daughter acknowledged the recommendation but supports moms decision to return home.  Pt declined DME. Maryclare Labrador, RN 04/10/2015, 10:19 AM

## 2015-04-10 NOTE — Progress Notes (Signed)
[facility]                                                                              Patient Demographics  Emily Kennedy, is a 70 y.o. female, DOB - 09/21/1945, [id number]  Admit date - 04/09/2015   Admitting Physician Rise Patience, MD  Outpatient Primary MD for the patient is Tommy Medal, MD  LOS - 1   Chief Complaint  Patient presents with  . Leg Pain      HPI On 04/09/2015 by Dr. Gean Birchwood [name] Kennedy is a 70 y.o. 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 Patient also has been having lower extremity weakness over the last couple of days. Denies any incontinence of urine or bowels. Denies any fall. Patient has been having some subjective feeling of chills. Patient has history of leukocytoclastic vasculitis. Patient states she had gone to urgent care and was prescribed antibiotics for the rash and has been to eat for last 1 week despite which the rash has not improved. Patient also has been having significant pain. Pain starts in the low back and involves both lower extremity. Patient's blood pressure also has been not well controlled and was initially started on clonidine last month by patient's PCP but patient states after she took that she became confused and last week had tapered off the medication. On exam patient has significant weakness of the lower extremities and low back pain. Forward flexes of the dependent and has a rash in the both lower extremity.   Assessment & Plan   Bilateral lower extremity weakness with lower back pain -Patient does have history of vasculitis, concerning for any spinal lesions -Admitting physician spoke with on-call neurologist who recommended MRI with and without contrast of the thoracic and lumbar spine -MRI of  thoracic spine: no significant finding -MRI lumbar spine: no acute abnormality within the lumbar spine to explain lower extremity weakness -Physical therapy consulted and appreciated and recommended SNF, patient refused.  -OT consulted and pending -Will plan to discharge patient with home health services  Leukocytoclastic vasculitis -CRP levels elevated -Continue Solu-Medrol, will decrease dose today  Hyponatremia -Improving, Likely secondary to poor oral intake -was placed on IVF -Sodium improved from 122 to 132 -TSH 12.438, FT4 1.11 -Cortisol 9.6 (considered normal for a.m. 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 Pending OT consult and improvement in BP. Likely  discharge within 24-48hours.   Time Spent in minutes 30 minutes  Procedures  None  Consults  Neurosurgery   DVT Prophylaxis SCDs  Lab Results  Component Value Date   PLT 229 04/10/2015    Medications  Scheduled Meds: . amLODipine  5 mg Oral Daily  . carvedilol  12.5 mg Oral BID WC  . hydrALAZINE  10 mg Oral 3 times per day  . insulin aspart  0-9 Units Subcutaneous TID WC  . insulin glargine  5 Units Subcutaneous  Daily  . [START ON 04/13/2015] levothyroxine  150 mcg Oral Once per day on Sun  . levothyroxine  75 mcg Oral Once per day on Mon Tue Wed Thu Fri Sat  . methylPREDNISolone (SOLU-MEDROL) injection  60 mg Intravenous Daily  . omega-3 acid ethyl esters  1 g Oral Daily  . [START ON 04/11/2015] pantoprazole  40 mg Oral Daily  . vitamin B-12  2,000 mcg Oral Daily   Continuous Infusions:  PRN Meds:.acetaminophen **OR** acetaminophen, hydrALAZINE, morphine injection, ondansetron **OR** ondansetron (ZOFRAN) IV  Antibiotics    Anti-infectives    None      Subjective:   Cathleen Garde seen and examined today.  Patient states she is still feeling weak.  Has a cough and her family has been sick for the past several weeks.  Denies chest pain, shortness of breath, abdominal pain.  Feels her rash is getting better.   Objective:   Filed Vitals:   04/10/15 0133 04/10/15 0500 04/10/15 0612 04/10/15 0807  BP: 146/58  174/76 177/80  Pulse:   83 84  Temp:   97.6 F (36.4 C)   TempSrc:   Oral   Resp:   16   Height:      Weight:  65.545 kg (144 lb 8 oz)    SpO2:   95% 97%    Wt Readings from Last 3 Encounters:  04/10/15 65.545 kg (144 lb 8 oz)  07/30/14 67.495 kg (148 lb 12.8 oz)  01/29/14 74.345 kg (163 lb 14.4 oz)     Intake/Output Summary (Last 24 hours) at 04/10/15 1042 Last data filed at 04/10/15 0700  Gross per 24 hour  Intake    360 ml  Output   1000 ml  Net   -640 ml    Exam  General: Well developed, well nourished, NAD  HEENT: NCAT,  mucous membranes moist.   Cardiovascular: S1 S2 auscultated, RRR  Respiratory: Clear to auscultation bilaterally, occ cough  Abdomen: Soft, nontender, nondistended, + bowel sounds  Extremities: warm dry without cyanosis clubbing or edema  Neuro: AAOx3, nonfocal  Skin: Vasculitic rash noted on lower ext b/l, improving in color  Psych: Normal affect and demeanor  Data Review   Micro Results No results found for this or any previous  visit (from the past 240 hour(s)).  Radiology Reports Dg Chest 2 View  04/10/2015  CLINICAL DATA:  Cough. EXAM: CHEST  2 VIEW COMPARISON:  10/16/2012. FINDINGS: Mediastinum hilar structures normal. Cardiomegaly mild pulmonary vascular prominence. Low lung volumes with mild basilar atelectasis. No pleural effusion or pneumothorax. IMPRESSION: 1. Cardiomegaly with mild pulmonary vascular prominence. No overt congestive heart failure. 2.  Low lung volumes with mild basilar atelectasis . Electronically Signed   By: Marcello Moores  Register   On: 04/10/2015 08:54   Mr Thoracic Spine Wo Contrast  04/09/2015  CLINICAL DATA:  Lower extremity weakness. Patient would not allow performance of lumbar portion of the study. EXAM: MRI THORACIC SPINE WITHOUT CONTRAST TECHNIQUE: Multiplanar,  multisequence MR imaging of the thoracic spine was performed. No intravenous contrast was administered. COMPARISON:  Chest radiography 10/16/2012 FINDINGS: Minimal curvature in the thoracic region without significant malalignment. No evidence of disc bulge or herniation in the thoracic region. There is some calcification within the T9-10 disc, not significant. The spinal canal is widely patent. Ample subarachnoid space surrounds the thoracic spinal cord. No abnormal cord signal. The T12-L1 disc shows a shallow protrusion more prominent towards the left but this does not appear to affect the neural structures. No focal osseous lesion. IMPRESSION: No significant finding in the thoracic region. No abnormality seen to explain lower extremity weakness. Electronically Signed   By: Nelson Chimes M.D.   On: 04/09/2015 10:31   Mr Lumbar Spine W Wo Contrast  04/10/2015  CLINICAL DATA:  Initial evaluation for lower extremity weakness, low back pain. EXAM: MRI LUMBAR SPINE WITHOUT AND WITH CONTRAST TECHNIQUE: Multiplanar and multiecho pulse sequences of the lumbar spine were obtained without and with intravenous contrast. CONTRAST:  28mL MULTIHANCE  GADOBENATE DIMEGLUMINE 529 MG/ML IV SOLN COMPARISON:  None. FINDINGS: Study is degraded by motion artifact. For the purposes of this dictation, the lowest well-formed intervertebral disc spaces presumed to be the L5-S1 level, and there presumed to be 5 lumbar type vertebral bodies. Vertebral bodies are normally aligned with preservation of the normal lumbar lordosis. Vertebral body heights are well maintained. No fracture. Signal intensity within the vertebral body bone marrow is normal. No marrow edema. No abnormal enhancement. Conus medullaris terminates normally at the L1 level. Signal intensity within the visualized cord is normal. Nerve roots of the cauda equina within normal limits. Paraspinous soft tissues within normal limits. No retroperitoneal adenopathy. Visualized facial structures unremarkable. T12-L1: Left paracentral disc protrusion indents the ventral thecal sac without significant stenosis (series 6, image 3). Disc desiccation with mild degenerative endplate changes. Foramina are widely patent. L1-2:  Negative. L2-3: Minimal diffuse annular disc bulge with associated endplate changes. No focal disc herniation. No significant stenosis. Mild facet hypertrophy. L3-4:  Negative. L4-5: Mild diffuse disc bulge with disc desiccation. No focal disc herniation. Facet and ligamentous hypertrophy. Changes superimposed on short pedicles results in mild canal stenosis. No significant foraminal narrowing. L5-S1:  Negative. IMPRESSION: 1. No acute abnormality within the lumbar spine. No finding to explain lower extremity weakness. 2. Left paracentral disc protrusion at T12-L1 without significant stenosis. 3. Degenerative disc bulge and facet hypertrophy at L4-5 with resultant mild canal narrowing. 4. Minimal degenerative disc disease at L2-3 without stenosis. Electronically Signed   By: Jeannine Boga M.D.   On: 04/10/2015 03:54    CBC  Recent Labs Lab 04/09/15 0213 04/09/15 0808 04/10/15 0328  WBC  9.5 8.7 8.3  HGB 10.2* 10.8* 9.8*  HCT 29.9* 31.5* 29.9*  PLT 216 231 229  MCV 81.3 81.0 80.6  MCH 27.7 27.8 26.4  MCHC 34.1 34.3 32.8  RDW 13.4 13.5 13.5  LYMPHSABS 1.0 1.0  --   MONOABS 0.4 0.5  --   EOSABS 0.0 0.0  --   BASOSABS 0.0 0.0  --     Chemistries   Recent Labs Lab 04/09/15 0213 04/09/15 0808 04/09/15 1535 04/09/15 1806 04/10/15 0328  NA 122* 129* 130* 130* 132*  K 4.1 4.1 4.2 4.2 3.8  CL 86* 93* 96* 94* 101  CO2 23 23 23 24 26   GLUCOSE 199* 140* 229* 225* 156*  BUN 12 11 14 12 15   CREATININE 0.56 0.49 0.56 0.60 0.58  CALCIUM 9.3 9.6  9.6 9.7 9.3  AST 37  --   --   --   --   ALT 39  --   --   --   --   ALKPHOS 65  --   --   --   --   BILITOT 0.9  --   --   --   --    ------------------------------------------------------------------------------------------------------------------ estimated creatinine clearance is 58.1 mL/min (by C-G formula based on Cr of 0.58). ------------------------------------------------------------------------------------------------------------------ No results for input(s): HGBA1C in the last 72 hours. ------------------------------------------------------------------------------------------------------------------ No results for input(s): CHOL, HDL, LDLCALC, TRIG, CHOLHDL, LDLDIRECT in the last 72 hours. ------------------------------------------------------------------------------------------------------------------  Recent Labs  04/09/15 0808  TSH 12.438*   ------------------------------------------------------------------------------------------------------------------ No results for input(s): VITAMINB12, FOLATE, FERRITIN, TIBC, IRON, RETICCTPCT in the last 72 hours.  Coagulation profile No results for input(s): INR, PROTIME in the last 168 hours.  No results for input(s): DDIMER in the last 72 hours.  Cardiac Enzymes No results for input(s): CKMB, TROPONINI, MYOGLOBIN in the last 168 hours.  Invalid input(s):  CK ------------------------------------------------------------------------------------------------------------------ Invalid input(s): POCBNP    Roald Lukacs D.O. on 04/10/2015 at 10:42 AM  Between 7am to 7pm - Pager - [phone]  After 7pm go to [email or web address] - password [email or web address]  And look for the night coverage person covering for me after hours  [facility] Office  [phone]

## 2015-04-10 NOTE — Evaluation (Signed)
Occupational Therapy Evaluation Patient Details Name: Emily Kennedy MRN: [id number] DOB: [birth year] Today's Date: 04/10/2015    History of Present Illness pt is a 70 y/o female with h/o DM, vasulitis, HTN, thyroid ds, RA, admitted with increasing, non improving, painful rash on both LE's in conjunction with LE weakness over the last few days.   Clinical Impression   At her baseline, one month ago, pt was independent in ADL, mobility and IADL.  Pt presents with some confusion, decreased awareness of safety, generalized weakness especially in her LEs, and impaired balance. She requires supervision for seated grooming and UB ADL and max assist for LB ADL. Per nursing, pt's ability to stand and transfer is widely variable. Will follow acutely.   Follow Up Recommendations  SNF;Supervision/Assistance - 24 hour    Equipment Recommendations  3 in 1 bedside comode    Recommendations for Other Services       Precautions / Restrictions Precautions Precautions: Fall      Mobility Bed Mobility               General bed mobility comments: in chair  Transfers Overall transfer level: Needs assistance Equipment used: Rolling walker (2 wheeled) Transfers: Sit to/from Stand Sit to Stand: Min assist         General transfer comment: verbal cues for hand placement, min assist to rise and shift weight forward    Balance     Sitting balance-Leahy Scale: Good       Standing balance-Leahy Scale: Poor                              ADL Overall ADL's : Needs assistance/impaired Eating/Feeding: Set up;Sitting   Grooming: Wash/dry hands;Wash/dry face;Sitting;Set up   Upper Body Bathing: Sitting;Supervision/ safety   Lower Body Bathing: Maximal assistance;Sit to/from stand   Upper Body Dressing : Set up;Sitting   Lower Body Dressing: Maximal assistance;Sit to/from stand   Toilet Transfer: Minimal assistance;BSC           Functional mobility during ADLs:  Cueing for sequencing;Rolling walker;Minimal assistance (3 feet) General ADL Comments: Pt not able to cross foot over opposite knee. Heavy reliance on UEs in standing.       Vision     Perception     Praxis      Pertinent Vitals/Pain Pain Assessment: 0-10 Pain Score: 4  Pain Location: head Pain Descriptors / Indicators: Aching Pain Intervention(s): Monitored during session;Patient requesting pain meds-RN notified     Hand Dominance Right   Extremity/Trunk Assessment Upper Extremity Assessment Upper Extremity Assessment: Generalized weakness   Lower Extremity Assessment Lower Extremity Assessment: Defer to PT evaluation       Communication Communication Communication: No difficulties   Cognition Arousal/Alertness: Awake/alert Behavior During Therapy: Flat affect Overall Cognitive Status: No family/caregiver present to determine baseline cognitive functioning Area of Impairment: Memory;Problem solving;Safety/judgement     Memory: Decreased short-term memory   Safety/Judgement: Decreased awareness of safety;Decreased awareness of deficits   Problem Solving: Slow processing;Requires tactile cues;Requires verbal cues;Difficulty sequencing     General Comments       Exercises       Shoulder Instructions      Home Living Family/patient expects to be discharged to:: Private residence Living Arrangements: Children (daughter) Available Help at Discharge: Family;Available 24 hours/day Type of Home: House Home Access: Level entry     Home Layout: One level     Bathroom Shower/Tub: Tub/shower  unit   Bathroom Toilet: Standard     Home Equipment: Cane - single point          Prior Functioning/Environment Level of Independence: Needs assistance  Gait / Transfers Assistance Needed: in the last 4 weeks pt has gotten progressively weaker and last week or more has needed significant assist to ambulate. ADL's / Homemaking Assistance Needed: typically is  independent in ADL and IADL, drives        OT Diagnosis: Generalized weakness;Cognitive deficits;Acute pain   OT Problem List: Decreased strength;Decreased activity tolerance;Impaired balance (sitting and/or standing);Decreased cognition;Decreased safety awareness;Decreased knowledge of use of DME or AE;Pain   OT Treatment/Interventions: Self-care/ADL training;DME and/or AE instruction;Patient/family education;Balance training;Therapeutic activities    OT Goals(Current goals can be found in the care plan section) Acute Rehab OT Goals Patient Stated Goal: pt interested in finding out whats going on,. OT Goal Formulation: With patient Time For Goal Achievement: 04/24/15 Potential to Achieve Goals: Good ADL Goals Pt Will Perform Grooming: with supervision;standing (2 activities) Pt Will Perform Lower Body Bathing: with supervision;sit to/from stand Pt Will Perform Lower Body Dressing: with supervision;sit to/from stand Pt Will Transfer to Toilet: with supervision;ambulating;bedside commode (over toilet) Pt Will Perform Toileting - Clothing Manipulation and hygiene: with supervision;sit to/from stand Pt Will Perform Tub/Shower Transfer: Tub transfer;with min guard assist;ambulating;rolling walker;3 in 1 Pt/caregiver will Perform Home Exercise Program: Increased strength;Both right and left upper extremity;With theraband;With Supervision (level 2)  OT Frequency: Min 2X/week   Barriers to D/C:            Co-evaluation              End of Session Equipment Utilized During Treatment: Gait belt;Rolling walker  Activity Tolerance: Patient tolerated treatment well Patient left: in chair;with call bell/phone within reach;with nursing/sitter in room   Time: 1001-1025 OT Time Calculation (min): 24 min Charges:  OT General Charges $OT Visit: 1 Procedure OT Evaluation $OT Eval Moderate Complexity: 1 Procedure OT Treatments $Self Care/Home Management : 8-22 mins G-Codes:     Malka So 04/10/2015, 10:41 AM  [phone]

## 2015-04-10 NOTE — NC FL2 (Signed)
[place] LEVEL OF CARE SCREENING TOOL     IDENTIFICATION  Patient Name: Emily Kennedy Birthdate: 1945-06-10 Sex: female Admission Date (Current Location): 04/09/2015  [facility] and [place] Number:  Herbalist and Address:  [facility], [place] [address] [place], [place] [address]      Provider Number: [id number]  Attending Physician Name and Address:  Cristal Ford, DO  Relative Name and Phone Number:       Current Level of Care: Hospital Recommended Level of Care: [place] Prior Approval Number:    Date Approved/Denied:   PASRR Number:    Discharge Plan: Home (home with advance home care)    Current Diagnoses: Patient Active Problem List   Diagnosis Date Noted  . Vasculitis ([place]) 04/09/2015  . Lower extremity weakness 04/09/2015  . Hyponatremia 04/09/2015  . Diabetes mellitus type 2, uncontrolled ([place]) 04/09/2015  . 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  BEHAVIORAL SYMPTOMS/MOOD NEUROLOGICAL BOWEL NUTRITION STATUS      Continent Diet (regular)  AMBULATORY STATUS COMMUNICATION OF NEEDS Skin   Limited Assist Verbally Normal                       Personal Care Assistance Level of Assistance  Bathing, Dressing, Feeding Bathing Assistance: Limited assistance Feeding assistance: Limited assistance Dressing Assistance: Limited assistance     Functional Limitations Info  Sight, Hearing, Speech Sight Info: Adequate Hearing Info: Adequate Speech Info: Adequate    SPECIAL CARE FACTORS FREQUENCY  PT (By licensed PT), OT (By licensed OT)     PT Frequency: 5x OT Frequency: 2x            Contractures      Additional Factors Info  Code Status, Allergies Code Status Info: full Allergies Info: ciprofloxancin, nitrofurantoin, sulfa  drugs,septra           Current Medications (04/10/2015):  This is the current hospital active medication list Current Facility-Administered Medications  Medication Dose Route Frequency Provider Last Rate Last Dose  . acetaminophen (TYLENOL) tablet 650 mg  650 mg Oral Q6H PRN Rise Patience, MD   650 mg at 04/10/15 1019   Or  . acetaminophen (TYLENOL) suppository 650 mg  650 mg Rectal Q6H PRN Rise Patience, MD      . amLODipine (NORVASC) tablet 5 mg  5 mg Oral Daily Rise Patience, MD   5 mg at 04/10/15 1019  . benzonatate (TESSALON) capsule 100 mg  100 mg Oral TID PRN Maryann Mikhail, DO      . carvedilol (COREG) tablet 12.5 mg  12.5 mg Oral BID WC Rise Patience, MD   12.5 mg at 04/10/15 [id number]  . hydrALAZINE (APRESOLINE) injection 10 mg  10 mg Intravenous Q4H PRN Rise Patience, MD   10 mg at 04/10/15 [id number]  . hydrALAZINE (APRESOLINE) tablet 10 mg  10 mg Oral 3 times per day Maryann Mikhail, DO      . insulin aspart (novoLOG) injection 0-9 Units  0-9 Units Subcutaneous TID WC Rise Patience, MD   2 Units at 04/10/15 1157  . insulin glargine (LANTUS) injection 5 Units  5 Units Subcutaneous Daily Rise Patience, MD   5 Units at 04/10/15 1018  . [START ON 04/13/2015] levothyroxine (SYNTHROID, LEVOTHROID) tablet 150  mcg  150 mcg Oral Once per day on Sun Arshad N Kakrakandy, MD      . levothyroxine (SYNTHROID, LEVOTHROID) tablet 75 mcg  75 mcg Oral Once per day on Mon Tue Wed Thu Fri Sat Rise Patience, MD   75 mcg at 04/10/15 [id number]  . [START ON 04/11/2015] methylPREDNISolone sodium succinate (SOLU-MEDROL) 40 mg/mL injection 40 mg  40 mg Intravenous Daily Maryann Mikhail, DO      . morphine 2 MG/ML injection 1 mg  1 mg Intravenous Q3H PRN Rise Patience, MD   1 mg at 04/09/15 1415  . omega-3 acid ethyl esters (LOVAZA) capsule 1 g  1 g Oral Daily Rise Patience, MD   1 g at 04/10/15 1018  . ondansetron (ZOFRAN) tablet 4 mg  4 mg Oral Q6H PRN Rise Patience, MD   4 mg at 04/10/15 1156   Or  . ondansetron (ZOFRAN) injection 4 mg  4 mg Intravenous Q6H PRN Rise Patience, MD      . Derrill Memo ON 04/11/2015] pantoprazole (PROTONIX) EC tablet 40 mg  40 mg Oral Daily Romona Curls, [facility]      . vitamin B-12 (CYANOCOBALAMIN) tablet 2,000 mcg  2,000 mcg Oral Daily Rise Patience, MD   2,000 mcg at 04/10/15 1018     Discharge Medications: Please see discharge summary for a list of discharge medications.  Relevant Imaging Results:  Relevant Lab Results:   Additional Information SS# [id number]  Minta Balsam  BSW intern  [phone]

## 2015-04-11 LAB — BASIC METABOLIC PANEL
Anion gap: 8 (ref 5–15)
BUN: 17 mg/dL (ref 6–20)
CALCIUM: 9.7 mg/dL (ref 8.9–10.3)
CO2: 25 mmol/L (ref 22–32)
CREATININE: 0.62 mg/dL (ref 0.44–1.00)
Chloride: 98 mmol/L — ABNORMAL LOW (ref 101–111)
GFR calc Af Amer: >60 mL/min (ref >60)
Glucose, Bld: 160 mg/dL — ABNORMAL HIGH (ref 65–99)
POTASSIUM: 3.9 mmol/L (ref 3.5–5.1)
SODIUM: 131 mmol/L — AB (ref 135–145)

## 2015-04-11 LAB — CBC
HEMATOCRIT: 33.9 % — AB (ref 36.0–46.0)
HEMOGLOBIN: 11.4 g/dL — AB (ref 12.0–15.0)
MCH: 27.4 pg (ref 26.0–34.0)
MCHC: 33.6 g/dL (ref 30.0–36.0)
MCV: 81.5 fL (ref 78.0–100.0)
PLATELETS: 317 10*3/uL (ref 150–400)
RBC: 4.16 MIL/uL (ref 3.87–5.11)
RDW: 13.8 % (ref 11.5–15.5)
WBC: 13 10*3/uL — ABNORMAL HIGH (ref 4.0–10.5)

## 2015-04-11 LAB — GLUCOSE, CAPILLARY
Glucose-Capillary: 178 mg/dL — ABNORMAL HIGH (ref 65–99)
Glucose-Capillary: 164 mg/dL — ABNORMAL HIGH (ref 65–99)

## 2015-04-11 MED ORDER — KETOROLAC TROMETHAMINE 30 MG/ML IJ SOLN
30.0000 mg | Freq: Once | INTRAMUSCULAR | Status: AC
  Administered 2015-04-11: 30 mg via INTRAVENOUS
  Filled 2015-04-11: qty 1

## 2015-04-11 MED ORDER — DIPHENHYDRAMINE HCL 50 MG/ML IJ SOLN: 25.0000 mg | Freq: Once | INTRAMUSCULAR | Status: DC

## 2015-04-11 MED ORDER — CARVEDILOL 25 MG PO TABS: 25.0000 mg | ORAL_TABLET | Freq: Two times a day (BID) | ORAL | Status: DC

## 2015-04-11 MED ORDER — AMLODIPINE BESYLATE 5 MG PO TABS: 5.0000 mg | ORAL_TABLET | Freq: Every day | ORAL | Status: DC

## 2015-04-11 MED ORDER — PREDNISONE 10 MG PO TABS: ORAL_TABLET | ORAL | Status: DC

## 2015-04-11 MED ORDER — PROMETHAZINE HCL 25 MG/ML IJ SOLN
12.5000 mg | Freq: Once | INTRAMUSCULAR | Status: AC
  Administered 2015-04-11: 12.5 mg via INTRAVENOUS
  Filled 2015-04-11: qty 1

## 2015-04-11 MED ORDER — LORAZEPAM 2 MG/ML IJ SOLN
1.0000 mg | Freq: Once | INTRAMUSCULAR | Status: AC
  Administered 2015-04-11: 1 mg via INTRAVENOUS
  Filled 2015-04-11: qty 1

## 2015-04-11 MED ORDER — METOCLOPRAMIDE HCL 5 MG/ML IJ SOLN: 10.0000 mg | Freq: Once | INTRAMUSCULAR | Status: DC

## 2015-04-11 MED ORDER — BENZONATATE 100 MG PO CAPS: 100.0000 mg | ORAL_CAPSULE | Freq: Three times a day (TID) | ORAL | Status: DC | PRN

## 2015-04-11 MED ORDER — HYDRALAZINE HCL 25 MG PO TABS: 25.0000 mg | ORAL_TABLET | Freq: Three times a day (TID) | ORAL | Status: DC

## 2015-04-11 MED ORDER — BUTALBITAL-APAP-CAFFEINE 50-325-40 MG PO TABS
1.0000 | ORAL_TABLET | Freq: Once | ORAL | Status: AC
  Administered 2015-04-11: 1 via ORAL
  Filled 2015-04-11: qty 1

## 2015-04-11 NOTE — Progress Notes (Signed)
Reviewed discharge instructions with patient and family. IV removed. No issues at present. Awaiting discharge.   Cullen Lahaie, Mervin Kung RN

## 2015-04-11 NOTE — Progress Notes (Signed)
Patient woke up and stated her headache is better from the medicine she got except for starting to be sick and nauseated this morning again. Will medicate with prn meds.

## 2015-04-11 NOTE — Discharge Instructions (Signed)
Vasculitis  Vasculitis is swelling (inflammation) of the blood vessels. With vasculitis, the blood vessels can become thick, narrow, scarred, or weak, and enough blood may not be able to flow through them. This can cause damage to the muscles, kidneys, lungs, brain, and other parts of the body.   There are many types of vasculitis. Some last only a short time while others last a long time.  CAUSES   The exact cause is unknown, but vasculitis can develop when the body's immune system attacks its own blood vessels. This attack can be caused by:  · An infection.  · An immune system disease, such as lupus, rheumatoid arthritis, or scleroderma.  · An allergic reaction to a medicine.  · Cancer that affects blood cells, such as leukemia and lymphoma.  RISK FACTORS  · Being a smoker.  · Being under stress.  · Having a physical injury.  SIGNS AND SYMPTOMS   Symptoms vary depending on the type of vasculitis you have. Symptoms that are common to all types of vasculitis include:  · Fever.  · Poor appetite.  · Weight loss.  · Feeling very tired.  · Having aches and pains.  · Weakness.  · Numbness in an area of your body.  Symptoms for specific types of vasculitis include:  · Skin problems, such as sores, spots, or rashes.  · Trouble seeing.  · Trouble breathing.  · Blood in your urine.  · Headaches.  · Stomach pain.  · Stuffy or bloody nose.  DIAGNOSIS   Your health care provider will ask about your symptoms and do a physical exam. You may have tests done, such as:  · A complete blood count (CBC).  · Erythrocyte sedimentation, also called sed rate test.  · C-reactive protein (CRP).  · Antineutrophil cytoplasmic antibodies (ANCA).  · A urine test.  · A biopsy of a blood vessel.  · A nerve conduction study.  · Imaging tests, such as:    X-rays.    A CT scan.    An ultrasound.    An MRI.    Angiography.  TREATMENT   Treatment will depend on the type of vasculitis you have and how severe it is. Sometimes treatment is not needed.  Treatment often includes:  · Medicines.  · Physical therapy or occupational therapy. This helps strengthen muscles that were weakened by the disease.  You will need to see your health care provider while you are being treated. During follow-up visits, your health care provider will:  · Perform blood tests and bone density tests.  · Check your blood pressure and blood sugar.  · Check for side effects of any medicines you are taking.  Vasculitis cannot always be cured. Sometimes symptoms go away but the disease does not (the disease goes in remission). If symptoms return, increased treatment may be needed.  HOME CARE INSTRUCTIONS   · Take medicines only as directed by your health care provider.  · Keep all follow-up visits as directed by your health care provider. This is important.  · Exercise. Talk with your health care provider about what exercises are okay for you to do. Usually exercises that increase your heart rate (aerobic exercise), such as walking, are recommended. Aerobic exercise helps control your blood pressure and prevent bone loss.  · Follow a healthy diet. Include healthy sources of protein, fruits, vegetables, and whole grains in your diet.  · Learn as much as you can about vasculitis, and consider joining a support group.   Understanding your condition and talking with others who have it may help you cope. Talk with your health care provider if you feel stressed, anxious, or depressed.  SEEK MEDICAL CARE IF:   · Your symptoms return, or you have new symptoms.  · Your fever, fatigue, headache, or weight loss gets worse.  · You have signs of infection, such as fever, warmth, tenderness, redness, or swelling.  SEEK IMMEDIATE MEDICAL CARE IF:   · Your vision gets worse.  · Your pain does not go away, even after you take pain medicine.  · You have chest or stomach pain.  · You have trouble breathing.  · One side of your face or body suddenly becomes weak or numb.  · Your nose bleeds.  · There is blood in  your urine.  MAKE SURE YOU:  · Understand these instructions.  · Will watch your condition.  · Will get help right away if you are not doing well or get worse.     This information is not intended to replace advice given to you by your health care provider. Make sure you discuss any questions you have with your health care provider.     Document Released: 12/26/2008 Document Revised: 03/22/2014 Document Reviewed: 04/25/2013  [employer]vier Interactive Patient Education ©2016 [employer]

## 2015-04-11 NOTE — Discharge Summary (Signed)
Physician Discharge Summary  Emily Kennedy [id number] DOB: 11/01/1945 DOA: 04/09/2015  PCP: Emily Medal, MD  Admit date: 04/09/2015 Discharge date: 04/11/2015  Time spent: 45 minutes  Recommendations for Outpatient Follow-up:  Patient will be discharged to home with home health services, PT, OT, RN, aide, SW.  Patient will need to follow up with primary care provider within one week of discharge to discuss blood pressure management and headaches, repeat CBC.  Follow up with oncologist and rheumatologist.  Patient should continue medications as prescribed.  Patient should follow a heart healthy/carb modified diet.   Discharge Diagnoses:  Bilateral lower joint weakness of lower back pain Leukocytoclastic vasculitis Hyponatremia Diabetes mellitus, type II Hypertension IgM MGUS Chronic anemia Hypothyroidism Cough Headaches Leukocytosis  Discharge Condition: Stable  Diet recommendation: Heart healthy/carb modified  Filed Weights   04/09/15 0448 04/10/15 0500 04/11/15 0500  Weight: 64.592 kg (142 lb 6.4 oz) 65.545 kg (144 lb 8 oz) 66 kg (145 lb 8.1 oz)    History of present illness:  On 04/09/2015 by Dr. Gean Kennedy Emily Kennedy is a 70 y.o. 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 Patient also has been having lower extremity weakness over the last couple of days. Denies any incontinence of urine or bowels. Denies any fall. Patient has been having some subjective feeling of chills. Patient has history of leukocytoclastic vasculitis. Patient states she had gone to urgent care and was prescribed antibiotics for the rash and has been to eat for last 1 week despite which the rash has not improved. Patient also has been having significant pain. Pain starts in the low  back and involves both lower extremity. Patient's blood pressure also has been not well controlled and was initially started on clonidine last month by patient's PCP but patient states after she took that she became confused and last week had tapered off the medication. On exam patient has significant weakness of the lower extremities and low back pain. Forward flexes of the dependent and has a rash in the both lower extremity.   Hospital Course:  Bilateral lower extremity weakness with lower back pain -Patient does have history of vasculitis, concerning for any spinal lesions -Admitting physician spoke with on-call neurologist who recommended MRI with and without contrast of the thoracic and lumbar spine -MRI of thoracic spine: no significant finding -MRI lumbar spine: no acute abnormality within the lumbar spine to explain lower extremity weakness -Physical and occupational therapy consulted and appreciated and recommended SNF, patient and family refused.  -Will plan to discharge patient with home health services  Leukocytoclastic vasculitis -CRP levels elevated -Was placed on solumedrol -Will discharge with prednisone taper, patient should follow up with PCP or rheumatologist  Hyponatremia -Improving, Likely secondary to poor oral intake -was placed on IVF -Sodium improved from 122 to 131 -TSH 12.438, FT4 1.11 -Cortisol 9.6 (considered normal for a.m. cortisol level)  Diabetes mellitus, type II, uncontrolled -Continue insulin sliding scale with CBG monitoring -Given use of Solu-Medrol, continue Lantus -Continue home regimen at discharge  Hypertension -Continue Coreg, amlodipine, hydralazine -Follow up with PCP for management  IgM MGUS -Followed by oncology  Chronic anemia -Hb currently 11.4 (at baseline)  Hypothyroidism -likely subclinical, given acute illness -TSH 12.438, FT4 1.11 -Continue Synthroid -Patient will need outpatient follow up and monitoring -Discussed  appropriate time of taking medications  Cough -CXR obtained, cardiomegaly with mild pulm vascular prominence, no overt CHF -Continue antitussives  Headaches -Patient states she drinks coffee and water all day -Overnight, patient was given toradol, ativan which helped only temporarily  -Patient given one dose of Fioricet, which helped  Leukocytosis -Likely secondary to steroids, no source of infection noted on CXR, or UA -repeat CBC in one week  Procedures  None  Consults  Neurology via phone by admitting physician  Discharge Exam: Filed Vitals:   04/11/15 0417 04/11/15 0900  BP: 167/94 161/73  Pulse: 93 79  Temp:    Resp:     Exam  General: Well developed, well nourished, NAD  HEENT: NCAT, mucous membranes moist.   Cardiovascular: S1 S2 auscultated, RRR  Respiratory: Clear to auscultation bilaterally, occ cough  Abdomen: Soft, nontender, nondistended, + bowel sounds  Extremities: warm dry without cyanosis clubbing or edema  Neuro: AAOx3, nonfocal  Skin: Vasculitic rash noted on lower ext b/l, improving  Psych: Normal affect and demeanor  Discharge Instructions     Medication List    STOP taking these medications        amoxicillin-clavulanate 875-125 MG tablet  Commonly known as:  AUGMENTIN      TAKE these medications        acyclovir 400 MG tablet  Commonly known as:  ZOVIRAX  Take 400 mg by mouth as needed (for break outs).     aspirin EC 81 MG tablet  Take 81 mg by mouth daily.     benzonatate 100 MG capsule  Commonly known as:  TESSALON  Take 1 capsule (100 mg total) by mouth 3 (three) times daily as needed for cough.     carvedilol 25 MG tablet  Commonly known as:  COREG  Take 1 tablet (25 mg total) by mouth 2 (two) times daily with a meal.     fish oil-omega-3 fatty acids 1000 MG capsule  Take 1 g by mouth daily.     levothyroxine 75 MCG tablet  Commonly known as:  SYNTHROID, LEVOTHROID  Take 75-150 mcg by mouth daily. Take  2 tablets on Sunday and then take 1 tablet the other days     metFORMIN 1000 MG tablet  Commonly known as:  GLUCOPHAGE  Take 1,000 mg by mouth 2 (two) times daily with a meal.     naproxen 500 MG tablet  Commonly known as:  NAPROSYN  Take 500 mg by mouth 2 (two) times daily with a meal.     predniSONE 10 MG tablet  Commonly known as:  DELTASONE  Take 40mg  (4 tabs) x 2 days, then taper to 30mg  (3 tabs) x2 days, then 20mg  (2 tabs) x  2days, then 10mg  (1 tab) x 2days, then OFF.     vitamin B-12 1000 MCG tablet  Commonly known as:  CYANOCOBALAMIN  Take 2,000 mcg by mouth daily.       Allergies  Allergen Reactions  . Benadryl [Diphenhydramine] Swelling  . Ciprofloxacin     Unable to recall  . Nitrofurantoin Monohyd Macro Hives  . Septra [Bactrim] Hives  . Sulfa Drugs Cross Reactors Hives  . Ceclor [Cefaclor] Rash    Breaks out in rash chest down.   Follow-up Information    Follow up with [facility].   Why:  rolling walker   Contact information:   [address] [place] [address] [phone]       Follow up with [facility].  Why:  registered nurse, social  work, Music therapist, physical therapy, occupational therapy   Contact information:   [address] [place] [address] [phone]       Follow up with Emily Medal, MD. Schedule an appointment as soon as possible for a visit in 1 week.   Specialty:  Internal Medicine   Why:  Hospital follow up   Contact information:   [address], [place] [address] [phone]        The results of significant diagnostics from this hospitalization (including imaging, microbiology, ancillary and laboratory) are listed below for reference.    Significant Diagnostic Studies: Dg Chest 2 View  04/10/2015  CLINICAL DATA:  Cough. EXAM: CHEST  2 VIEW COMPARISON:  10/16/2012. FINDINGS: Mediastinum hilar structures normal. Cardiomegaly mild pulmonary  vascular prominence. Low lung volumes with mild basilar atelectasis. No pleural effusion or pneumothorax. IMPRESSION: 1. Cardiomegaly with mild pulmonary vascular prominence. No overt congestive heart failure. 2.  Low lung volumes with mild basilar atelectasis . Electronically Signed   By: Marcello Moores  Register   On: 04/10/2015 08:54   Mr Thoracic Spine Wo Contrast  04/09/2015  CLINICAL DATA:  Lower extremity weakness. Patient would not allow performance of lumbar portion of the study. EXAM: MRI THORACIC SPINE WITHOUT CONTRAST TECHNIQUE: Multiplanar, multisequence MR imaging of the thoracic spine was performed. No intravenous contrast was administered. COMPARISON:  Chest radiography 10/16/2012 FINDINGS: Minimal curvature in the thoracic region without significant malalignment. No evidence of disc bulge or herniation in the thoracic region. There is some calcification within the T9-10 disc, not significant. The spinal canal is widely patent. Ample subarachnoid space surrounds the thoracic spinal cord. No abnormal cord signal. The T12-L1 disc shows a shallow protrusion more prominent towards the left but this does not appear to affect the neural structures. No focal osseous lesion. IMPRESSION: No significant finding in the thoracic region. No abnormality seen to explain lower extremity weakness. Electronically Signed   By: Nelson Chimes M.D.   On: 04/09/2015 10:31   Mr Lumbar Spine W Wo Contrast  04/10/2015  CLINICAL DATA:  Initial evaluation for lower extremity weakness, low back pain. EXAM: MRI LUMBAR SPINE WITHOUT AND WITH CONTRAST TECHNIQUE: Multiplanar and multiecho pulse sequences of the lumbar spine were obtained without and with intravenous contrast. CONTRAST:  59mL MULTIHANCE GADOBENATE DIMEGLUMINE 529 MG/ML IV SOLN COMPARISON:  None. FINDINGS: Study is degraded by motion artifact. For the purposes of this dictation, the lowest well-formed intervertebral disc spaces presumed to be the L5-S1 level, and there  presumed to be 5 lumbar type vertebral bodies. Vertebral bodies are normally aligned with preservation of the normal lumbar lordosis. Vertebral body heights are well maintained. No fracture. Signal intensity within the vertebral body bone marrow is normal. No marrow edema. No abnormal enhancement. Conus medullaris terminates normally at the L1 level. Signal intensity within the visualized cord is normal. Nerve roots of the cauda equina within normal limits. Paraspinous soft tissues within normal limits. No retroperitoneal adenopathy. Visualized facial structures unremarkable. T12-L1: Left paracentral disc protrusion indents the ventral thecal sac without significant stenosis (series 6, image 3). Disc desiccation with mild degenerative endplate changes. Foramina are widely patent. L1-2:  Negative. L2-3: Minimal diffuse annular disc bulge with associated endplate changes. No focal disc herniation. No significant stenosis. Mild facet hypertrophy. L3-4:  Negative. L4-5: Mild diffuse disc bulge with disc desiccation. No focal disc herniation. Facet and ligamentous hypertrophy. Changes superimposed on short pedicles results in mild canal stenosis. No  significant foraminal narrowing. L5-S1:  Negative. IMPRESSION: 1. No acute abnormality within the lumbar spine. No finding to explain lower extremity weakness. 2. Left paracentral disc protrusion at T12-L1 without significant stenosis. 3. Degenerative disc bulge and facet hypertrophy at L4-5 with resultant mild canal narrowing. 4. Minimal degenerative disc disease at L2-3 without stenosis. Electronically Signed   By: Jeannine Boga M.D.   On: 04/10/2015 03:54    Microbiology: No results found for this or any previous visit (from the past 240 hour(s)).   Labs: Basic Metabolic Panel:  Recent Labs Lab 04/09/15 0808 04/09/15 1535 04/09/15 1806 04/10/15 0328 04/11/15 0305  NA 129* 130* 130* 132* 131*  K 4.1 4.2 4.2 3.8 3.9  CL 93* 96* 94* 101 98*  CO2 23  23 24 26 25   GLUCOSE 140* 229* 225* 156* 160*  BUN 11 14 12 15 17   CREATININE 0.49 0.56 0.60 0.58 0.62  CALCIUM 9.6 9.6 9.7 9.3 9.7   Liver Function Tests:  Recent Labs Lab 04/09/15 0213  AST 37  ALT 39  ALKPHOS 65  BILITOT 0.9  PROT 7.2  ALBUMIN 3.5   No results for input(s): LIPASE, AMYLASE in the last 168 hours. No results for input(s): AMMONIA in the last 168 hours. CBC:  Recent Labs Lab 04/09/15 0213 04/09/15 0808 04/10/15 0328 04/11/15 0305  WBC 9.5 8.7 8.3 13.0*  NEUTROABS 8.0* 7.1  --   --   HGB 10.2* 10.8* 9.8* 11.4*  HCT 29.9* 31.5* 29.9* 33.9*  MCV 81.3 81.0 80.6 81.5  PLT 216 231 229 317   Cardiac Enzymes: No results for input(s): CKTOTAL, CKMB, CKMBINDEX, TROPONINI in the last 168 hours. BNP: BNP (last 3 results) No results for input(s): BNP in the last 8760 hours.  ProBNP (last 3 results) No results for input(s): PROBNP in the last 8760 hours.  CBG:  Recent Labs Lab 04/10/15 0620 04/10/15 1123 04/10/15 1656 04/10/15 2043 04/11/15 0621  GLUCAP 153* 186* 238* 202* 178*       Signed:  Cristal Ford  [facility] 04/11/2015, 10:52 AM

## 2015-04-11 NOTE — Progress Notes (Signed)
Inpatient Diabetes Program Recommendations  AACE/ADA: New Consensus Statement on Inpatient Glycemic Control (2015)  Target Ranges:  Prepandial:   less than 140 mg/dL      Peak postprandial:   less than 180 mg/dL (1-2 hours)      Critically ill patients:  140 - 180 mg/dL  Results for PAM, FINNIGAN (MRN [id number]) as of 04/11/2015 09:25  Ref. Range 04/10/2015 06:20 04/10/2015 11:23 04/10/2015 16:56 04/10/2015 20:43 04/11/2015 06:21  Glucose-Capillary Latest Ref Range: 65-99 mg/dL 153 (H) 186 (H) 238 (H) 202 (H) 178 (H)   Review of Glycemic Control  Diabetes history: DM2 Outpatient Diabetes medications: Metformin 1000 mg BID Current orders for Inpatient glycemic control: Lantus 5 units daily, Novolog 0-9 units TID with meals  Inpatient Diabetes Program Recommendations: Insulin - Meal Coverage: Post prandial glucose is consistently elevated. While inpatient and ordered steroids, please consider ordering Novolog 3 units TID with meals for meal coverage.  Thanks, Barnie Alderman, RN, MSN, CDE Diabetes Coordinator Inpatient Diabetes Program [phone] (Team Pager from [place] to [place]) [phone] ([place] office) [phone] [facility] office) [phone] [facility] office)

## 2015-04-11 NOTE — Care Management Note (Signed)
Case Management Note  Patient Details  Name: Emily Kennedy MRN: [id number] Date of Birth: 1945/03/28  Subjective/Objective:  Pt admitted with lower extremity weakness                  Action/Plan:  Pt is from home with daughter, however daughter works away from the home.  Recommendation has been placed for SNF.  Attending and CM spoke with pt about recommendation, pt is refusing SNF, stated "my daughter will be there and I will be fine".  CM explained safety concerns when daughter has to go to work, pt simply stated "I'm not going anywhere else but home".  Attending also spoke with daughter; daughter is aware of recommendation however agreed to care for pt at home.  CM will arrange [facility] for pt prior to discharge.    Expected Discharge Date:                  Expected Discharge Plan:  [place]  In-House Referral:  Clinical Social Work  Discharge planning Services  CM Consult  Post Acute Care Choice:    Choice offered to:  Adult Children (daughter Emily Kennedy)  DME Arranged:  Walker rolling DME Agency:  [place] Arranged:  RN, PT, OT, Nurse's Aide, Social Work [employer] Agency:     Status of Service:  In process, will continue to follow  Medicare Important Message Given:    Date Medicare IM Given:    Medicare IM give by:    Date Additional Medicare IM Given:    Additional Medicare Important Message give by:     If discussed at [place] of Stay Meetings, dates discussed:    Additional Comments: Pt is now agreeable to rolling walker.  CM attempted to offered choice for [facility] as ordered; pt deferred choice of agency to daughter.  CM spoke with daughter Emily Kennedy via phone, [facility] was chosen.  CM contacted agency, referral was accepted.  Daughter informed CM that she is in the process of arranging to work from home however she is unsure at the time if it will be approved, CM reiterated that the recommendation is SNF, daughter acknowledged the recommendation but  supports moms decision to return home.  Pt declined DME. Maryclare Labrador, RN 04/11/2015, 8:38 AM

## 2015-04-11 NOTE — Care Management Note (Signed)
Case Management Note  Patient Details  Name: Emily Kennedy MRN: [id number] Date of Birth: 07-16-1945  Subjective/Objective:  Pt admitted with lower extremity weakness                  Action/Plan:  Pt is from home with daughter, however daughter works away from the home.  Recommendation has been placed for SNF.  Attending and CM spoke with pt about recommendation, pt is refusing SNF, stated "my daughter will be there and I will be fine".  CM explained safety concerns when daughter has to go to work, pt simply stated "I'm not going anywhere else but home".  Attending also spoke with daughter; daughter is aware of recommendation however agreed to care for pt at home.  CM will arrange [facility] for pt prior to discharge.    Expected Discharge Date:                  Expected Discharge Plan:  [place]  In-House Referral:  Clinical Social Work  Discharge planning Services  CM Consult  Post Acute Care Choice:    Choice offered to:  Adult Children (daughter [name])  DME Arranged:  Walker rolling DME Agency:  [place] Arranged:  RN, PT, OT, Nurse's Aide, Social Work [employer] Agency:     Status of Service:  Completed, signed off  [employer] Given:  Yes Date Medicare IM Given:    Medicare IM give by:    Date Additional Medicare IM Given:    Additional Medicare Important Message give by:     If discussed at [place] of Stay Meetings, dates discussed:    Additional Comments: Pt is now agreeable to rolling walker.  CM attempted to offered choice for [facility] as ordered; pt deferred choice of agency to daughter.  CM spoke with daughter [name] via phone, [facility] was chosen.  CM contacted agency, referral was accepted.  Daughter informed CM that she is in the process of arranging to work from home however she is unsure at the time if it will be approved, CM reiterated that the recommendation is SNF, daughter acknowledged the recommendation but supports moms  decision to return home.  Pt declined DME. Maryclare Labrador, RN 04/11/2015, 11:58 AM

## 2015-04-11 NOTE — Progress Notes (Signed)
Patient complained of being sick to her stomach this morning again start dry heaving, phenergan  12.5mg . IV x1 dose was given  And patient was able to take her po meds. Will continue to monitor patient.

## 2015-04-11 NOTE — Care Management Important Message (Signed)
Important Message  Patient Details  Name: Emily Kennedy MRN: [id number] Date of Birth: 05-15-1945   Medicare Important Message Given:  Yes    [name] 04/11/2015, 11:15 AM

## 2015-04-11 NOTE — Progress Notes (Signed)
Physical Therapy Treatment Patient Details Name: Emily Kennedy MRN: [id number] DOB: 04-09-1945 Today's Date: 04/11/2015    History of Present Illness pt is a 70 y/o female with h/o DM, vasulitis, HTN, thyroid ds, RA, admitted with increasing, non improving, painful rash on both LE's in conjunction with LE weakness over the last few days.    PT Comments    Attempting to progress mobility with patient as tolerated. Able to ambulate 6 feet with rw and +86min assist. Based upon the patient's current mobility level, recommending SNF upon D/C. PT to continue to follow acutely.   Follow Up Recommendations  SNF;Supervision/Assistance - 24 hour     Equipment Recommendations  None recommended by PT    Recommendations for Other Services       Precautions / Restrictions Precautions Precautions: Fall    Mobility  Bed Mobility Overal bed mobility: Needs Assistance Bed Mobility: Supine to Sit     Supine to sit: Min assist     General bed mobility comments: assist with scooting to edge of bed  Transfers Overall transfer level: Needs assistance Equipment used: Rolling walker (2 wheeled) Transfers: Sit to/from Stand Sit to Stand: Min assist         General transfer comment: cues for hand placement, posterior lean with standing.   Ambulation/Gait Ambulation/Gait assistance: +2 physical assistance;Min assist Ambulation Distance (Feet): 6 Feet Assistive device: Rolling walker (2 wheeled) Gait Pattern/deviations: Decreased step length - right;Decreased step length - left;Step-through pattern     General Gait Details: cues for safety and posture. Amb. 3 feet forward and 3 feet backward.    Stairs            Wheelchair Mobility    Modified Rankin (Stroke Patients Only)       Balance Overall balance assessment: Needs assistance Sitting-balance support: Feet supported Sitting balance-Leahy Scale: Good     Standing balance support: Bilateral upper extremity  supported Standing balance-Leahy Scale: Poor Standing balance comment: using rw for support                    Cognition Arousal/Alertness: Awake/alert Behavior During Therapy: WFL for tasks assessed/performed Overall Cognitive Status: Within Functional Limits for tasks assessed                      Exercises      General Comments        Pertinent Vitals/Pain Pain Assessment: 0-10 Pain Score: 4  Pain Location: head Pain Descriptors / Indicators: Aching Pain Intervention(s): Limited activity within patient's tolerance;Monitored during session    Home Living                      Prior Function            PT Goals (current goals can now be found in the care plan section) Acute Rehab PT Goals Patient Stated Goal: just feel better.  PT Goal Formulation: With patient Time For Goal Achievement: 04/23/15 Potential to Achieve Goals: Fair Progress towards PT goals: Progressing toward goals    Frequency  Min 3X/week    PT Plan Current plan remains appropriate    Co-evaluation             End of Session Equipment Utilized During Treatment: Gait belt Activity Tolerance: Patient limited by fatigue Patient left: in chair;with call bell/phone within reach;with bed alarm set     Time: [id number] PT Time Calculation (min) (ACUTE ONLY): 16 min  Charges:  $Therapeutic Activity: 8-22 mins                    G Codes:      Cassell Clement, PT, CSCS Pager [phone] Office [phone]  04/11/2015, 1:30 PM

## 2015-04-11 NOTE — Progress Notes (Signed)
Patient woke up with still bad headache, SBP was >180's was given hyrdralazine prn. Patient with dry heaving on and off very nauseated, called  K. SchorrNP  And was notfied of patient symptoms. With orders made,  After hydralazine dose SBP was in 160's, toradol IV x1 dose 30mg  given and ativan 1mg  IV as well. Patient is resting at present and sleeping.

## 2015-05-17 ENCOUNTER — Encounter (HOSPITAL_COMMUNITY): Payer: Self-pay | Admitting: Emergency Medicine

## 2015-05-17 ENCOUNTER — Emergency Department (HOSPITAL_COMMUNITY): Payer: Medicare Other

## 2015-05-17 ENCOUNTER — Inpatient Hospital Stay (HOSPITAL_COMMUNITY)
Admission: EM | Admit: 2015-05-17 | Discharge: 2015-05-20 | DRG: 641 | Disposition: A | Discharge status: Home Health | Payer: Medicare Other | Attending: Internal Medicine | Admitting: Internal Medicine

## 2015-05-17 DIAGNOSIS — R600 Localized edema: Secondary | ICD-10-CM | POA: Diagnosis present

## 2015-05-17 DIAGNOSIS — I119 Hypertensive heart disease without heart failure: Secondary | ICD-10-CM | POA: Diagnosis present

## 2015-05-17 DIAGNOSIS — Z882 Allergy status to sulfonamides status: Secondary | ICD-10-CM | POA: Diagnosis not present

## 2015-05-17 DIAGNOSIS — Z881 Allergy status to other antibiotic agents status: Secondary | ICD-10-CM

## 2015-05-17 DIAGNOSIS — D638 Anemia in other chronic diseases classified elsewhere: Secondary | ICD-10-CM | POA: Diagnosis present

## 2015-05-17 DIAGNOSIS — Z888 Allergy status to other drugs, medicaments and biological substances status: Secondary | ICD-10-CM

## 2015-05-17 DIAGNOSIS — E871 Hypo-osmolality and hyponatremia: Principal | ICD-10-CM | POA: Diagnosis present

## 2015-05-17 DIAGNOSIS — Z791 Long term (current) use of non-steroidal anti-inflammatories (NSAID): Secondary | ICD-10-CM

## 2015-05-17 DIAGNOSIS — Z7982 Long term (current) use of aspirin: Secondary | ICD-10-CM

## 2015-05-17 DIAGNOSIS — E118 Type 2 diabetes mellitus with unspecified complications: Secondary | ICD-10-CM | POA: Diagnosis not present

## 2015-05-17 DIAGNOSIS — E119 Type 2 diabetes mellitus without complications: Secondary | ICD-10-CM | POA: Diagnosis present

## 2015-05-17 DIAGNOSIS — M310 Hypersensitivity angiitis: Secondary | ICD-10-CM | POA: Diagnosis present

## 2015-05-17 DIAGNOSIS — Z7984 Long term (current) use of oral hypoglycemic drugs: Secondary | ICD-10-CM | POA: Diagnosis not present

## 2015-05-17 DIAGNOSIS — E039 Hypothyroidism, unspecified: Secondary | ICD-10-CM | POA: Diagnosis not present

## 2015-05-17 DIAGNOSIS — I10 Essential (primary) hypertension: Secondary | ICD-10-CM | POA: Diagnosis present

## 2015-05-17 DIAGNOSIS — D472 Monoclonal gammopathy: Secondary | ICD-10-CM | POA: Diagnosis present

## 2015-05-17 DIAGNOSIS — R112 Nausea with vomiting, unspecified: Secondary | ICD-10-CM

## 2015-05-17 DIAGNOSIS — R609 Edema, unspecified: Secondary | ICD-10-CM

## 2015-05-17 LAB — CBC WITH DIFFERENTIAL/PLATELET
BASOS PCT: 0 %
Basophils Absolute: 0 10*3/uL (ref 0.0–0.1)
EOS ABS: 0 10*3/uL (ref 0.0–0.7)
Eosinophils Relative: 1 %
HEMATOCRIT: 28.2 % — AB (ref 36.0–46.0)
HEMOGLOBIN: 10 g/dL — AB (ref 12.0–15.0)
Lymphocytes Relative: 13 %
Lymphs Abs: 1 10*3/uL (ref 0.7–4.0)
MCH: 27.2 pg (ref 26.0–34.0)
MCHC: 35.5 g/dL (ref 30.0–36.0)
MCV: 76.8 fL — ABNORMAL LOW (ref 78.0–100.0)
MONO ABS: 0.4 10*3/uL (ref 0.1–1.0)
MONOS PCT: 6 %
NEUTROS PCT: 80 %
Neutro Abs: 6.2 10*3/uL (ref 1.7–7.7)
Platelets: 121 10*3/uL — ABNORMAL LOW (ref 150–400)
RBC: 3.67 MIL/uL — AB (ref 3.87–5.11)
RDW: 15.3 % (ref 11.5–15.5)
WBC: 7.7 10*3/uL (ref 4.0–10.5)

## 2015-05-17 LAB — COMPREHENSIVE METABOLIC PANEL
ALBUMIN: 3.2 g/dL — AB (ref 3.5–5.0)
ALK PHOS: 56 U/L (ref 38–126)
ALT: 20 U/L (ref 14–54)
ANION GAP: 12 (ref 5–15)
AST: 26 U/L (ref 15–41)
BILIRUBIN TOTAL: 0.8 mg/dL (ref 0.3–1.2)
BUN: 10 mg/dL (ref 6–20)
CALCIUM: 9.4 mg/dL (ref 8.9–10.3)
CO2: 22 mmol/L (ref 22–32)
CREATININE: 0.58 mg/dL (ref 0.44–1.00)
Chloride: 88 mmol/L — ABNORMAL LOW (ref 101–111)
GFR calc Af Amer: >60 mL/min (ref >60)
GFR calc non Af Amer: >60 mL/min (ref >60)
GLUCOSE: 115 mg/dL — AB (ref 65–99)
Potassium: 4.2 mmol/L (ref 3.5–5.1)
Sodium: 122 mmol/L — ABNORMAL LOW (ref 135–145)
TOTAL PROTEIN: 6.7 g/dL (ref 6.5–8.1)

## 2015-05-17 LAB — URINE MICROSCOPIC-ADD ON

## 2015-05-17 LAB — I-STAT TROPONIN, ED: Troponin i, poc: 0.01 ng/mL (ref 0.00–0.08)

## 2015-05-17 LAB — URINALYSIS, ROUTINE W REFLEX MICROSCOPIC
BILIRUBIN URINE: NEGATIVE
GLUCOSE, UA: NEGATIVE mg/dL
Hgb urine dipstick: NEGATIVE
KETONES UR: NEGATIVE mg/dL
LEUKOCYTES UA: NEGATIVE
Nitrite: NEGATIVE
PH: 5 (ref 5.0–8.0)
PROTEIN: 30 mg/dL — AB
Specific Gravity, Urine: 1.009 (ref 1.005–1.030)

## 2015-05-17 LAB — GLUCOSE, CAPILLARY: GLUCOSE-CAPILLARY: 121 mg/dL — AB (ref 65–99)

## 2015-05-17 LAB — BRAIN NATRIURETIC PEPTIDE: B Natriuretic Peptide: 78.4 pg/mL (ref 0.0–100.0)

## 2015-05-17 MED ORDER — CARVEDILOL 12.5 MG PO TABS
25.0000 mg | ORAL_TABLET | Freq: Two times a day (BID) | ORAL | Status: DC
  Administered 2015-05-18 – 2015-05-20 (×5): 25 mg via ORAL
  Filled 2015-05-17 (×5): qty 2

## 2015-05-17 MED ORDER — SODIUM CHLORIDE 0.9% FLUSH
3.0000 mL | Freq: Two times a day (BID) | INTRAVENOUS | Status: DC
  Administered 2015-05-18 – 2015-05-19 (×4): 3 mL via INTRAVENOUS

## 2015-05-17 MED ORDER — DULOXETINE HCL 30 MG PO CPEP
30.0000 mg | ORAL_CAPSULE | Freq: Two times a day (BID) | ORAL | Status: DC
  Administered 2015-05-18 – 2015-05-20 (×5): 30 mg via ORAL
  Filled 2015-05-17 (×5): qty 1

## 2015-05-17 MED ORDER — HYDRALAZINE HCL 25 MG PO TABS
25.0000 mg | ORAL_TABLET | Freq: Three times a day (TID) | ORAL | Status: DC
  Administered 2015-05-18 – 2015-05-20 (×8): 25 mg via ORAL
  Filled 2015-05-17 (×8): qty 1

## 2015-05-17 MED ORDER — ASPIRIN EC 81 MG PO TBEC
81.0000 mg | DELAYED_RELEASE_TABLET | Freq: Every day | ORAL | Status: DC
  Administered 2015-05-18 – 2015-05-19 (×2): 81 mg via ORAL
  Filled 2015-05-17 (×2): qty 1

## 2015-05-17 MED ORDER — FUROSEMIDE 10 MG/ML IJ SOLN
40.0000 mg | Freq: Once | INTRAMUSCULAR | Status: AC
Start: 2015-05-17 — End: 2015-05-17
  Administered 2015-05-17: 40 mg via INTRAVENOUS
  Filled 2015-05-17: qty 4

## 2015-05-17 MED ORDER — LEVOTHYROXINE SODIUM 75 MCG PO TABS
150.0000 ug | ORAL_TABLET | ORAL | Status: DC
  Administered 2015-05-18: 150 ug via ORAL
  Filled 2015-05-17: qty 2

## 2015-05-17 MED ORDER — ONDANSETRON HCL 4 MG/2ML IJ SOLN
4.0000 mg | Freq: Four times a day (QID) | INTRAMUSCULAR | Status: DC | PRN
Start: 2015-05-17 — End: 2015-05-20
  Administered 2015-05-18 (×2): 4 mg via INTRAVENOUS
  Filled 2015-05-17 (×2): qty 2

## 2015-05-17 MED ORDER — INSULIN ASPART 100 UNIT/ML ~~LOC~~ SOLN
0.0000 [IU] | Freq: Three times a day (TID) | SUBCUTANEOUS | Status: DC
  Administered 2015-05-18: 2 [IU] via SUBCUTANEOUS
  Administered 2015-05-18: 3 [IU] via SUBCUTANEOUS
  Administered 2015-05-19 (×2): 2 [IU] via SUBCUTANEOUS
  Administered 2015-05-20: 1 [IU] via SUBCUTANEOUS
  Administered 2015-05-20: 3 [IU] via SUBCUTANEOUS

## 2015-05-17 MED ORDER — ENOXAPARIN SODIUM 40 MG/0.4ML ~~LOC~~ SOLN
40.0000 mg | Freq: Every day | SUBCUTANEOUS | Status: DC
  Administered 2015-05-18 – 2015-05-20 (×3): 40 mg via SUBCUTANEOUS
  Filled 2015-05-17 (×3): qty 0.4

## 2015-05-17 MED ORDER — INSULIN ASPART 100 UNIT/ML ~~LOC~~ SOLN: 0.0000 [IU] | Freq: Every day | SUBCUTANEOUS | Status: DC

## 2015-05-17 MED ORDER — LEVOTHYROXINE SODIUM 75 MCG PO TABS: 75.0000 ug | ORAL_TABLET | Freq: Every day | ORAL | Status: DC

## 2015-05-17 MED ORDER — SODIUM CHLORIDE 0.9 % IV SOLN
INTRAVENOUS | Status: DC
  Administered 2015-05-18: 02:00:00 via INTRAVENOUS

## 2015-05-17 MED ORDER — ACETAMINOPHEN 500 MG PO TABS
1000.0000 mg | ORAL_TABLET | Freq: Four times a day (QID) | ORAL | Status: DC | PRN
  Administered 2015-05-17 – 2015-05-20 (×5): 1000 mg via ORAL
  Filled 2015-05-17 (×5): qty 2

## 2015-05-17 MED ORDER — OMEGA-3-ACID ETHYL ESTERS 1 G PO CAPS
1.0000 g | ORAL_CAPSULE | Freq: Every day | ORAL | Status: DC
  Administered 2015-05-18 – 2015-05-20 (×3): 1 g via ORAL
  Filled 2015-05-17 (×3): qty 1

## 2015-05-17 MED ORDER — ONDANSETRON HCL 4 MG PO TABS: 4.0000 mg | ORAL_TABLET | Freq: Four times a day (QID) | ORAL | Status: DC | PRN

## 2015-05-17 MED ORDER — KETOROLAC TROMETHAMINE 15 MG/ML IJ SOLN
15.0000 mg | Freq: Four times a day (QID) | INTRAMUSCULAR | Status: DC | PRN
  Administered 2015-05-18: 15 mg via INTRAVENOUS
  Filled 2015-05-17: qty 1

## 2015-05-17 MED ORDER — LEVOTHYROXINE SODIUM 75 MCG PO TABS
75.0000 ug | ORAL_TABLET | ORAL | Status: DC
  Administered 2015-05-19: 75 ug via ORAL
  Filled 2015-05-17: qty 1

## 2015-05-17 NOTE — ED Notes (Signed)
Pt reports bp has been up and down over past 2 weeks. C.o nv x 3 today. Denies pain.

## 2015-05-17 NOTE — ED Notes (Signed)
Entered to provide interventions. Patient not currently in room.

## 2015-05-17 NOTE — ED Provider Notes (Addendum)
CSN: [id number]     Arrival date & time 05/17/15  1934 History   First MD Initiated Contact with Patient 05/17/15 1950     Chief Complaint  Patient presents with  . Hypertension     (Consider location/radiation/quality/duration/timing/severity/associated sxs/prior Treatment) HPI Comments: Patient is a 70 year old female with past medical history of diabetes, hypertension, and hypothyroidism. She presents for evaluation of headache, weakness, elevated blood pressure, and leg swelling for the past 2 weeks. She was recently admitted to the hospital for elevated blood pressure and was started on 2 new blood pressure medications. She was also seen by her PCP and started on Lasix 2 days ago for leg swelling. She feels somewhat short of breath on exertion. She denies chest pain, fevers, or chills.  Patient is a 70 y.o. female presenting with hypertension. The history is provided by the patient.  Hypertension Episode onset: 2 weeks ago. The problem occurs constantly. The problem has not changed since onset.Pertinent negatives include no chest pain, no headaches and no shortness of breath. Nothing aggravates the symptoms. Nothing relieves the symptoms. She has tried nothing for the symptoms. The treatment provided no relief.    Past Medical History  Diagnosis Date  . Diabetes mellitus   . Vasculitis ([place])   . Hypertension   . Thyroid disease    Past Surgical History  Procedure Laterality Date  . Tubal ligation    . Melanoma excision     Family History  Problem Relation Age of Onset  . Cancer Sister    Social History  Substance Use Topics  . Smoking status: Never Smoker   . Smokeless tobacco: Never Used  . Alcohol Use: No   OB History    No data available     Review of Systems  Respiratory: Negative for shortness of breath.   Cardiovascular: Negative for chest pain.  Neurological: Negative for headaches.  All other systems reviewed and are negative.     Allergies  Benadryl;  Ciprofloxacin; Nitrofurantoin monohyd macro; Septra; Sulfa drugs cross reactors; and Ceclor  Home Medications   Prior to Admission medications   Medication Sig Start Date End Date Taking? Authorizing Provider  acyclovir (ZOVIRAX) 400 MG tablet Take 400 mg by mouth as needed (for break outs).     Historical Provider, MD  amLODipine (NORVASC) 5 MG tablet Take 1 tablet (5 mg total) by mouth daily. 04/11/15   Maryann Mikhail, DO  aspirin EC 81 MG tablet Take 81 mg by mouth daily.      Historical Provider, MD  benzonatate (TESSALON) 100 MG capsule Take 1 capsule (100 mg total) by mouth 3 (three) times daily as needed for cough. 04/11/15   Maryann Mikhail, DO  carvedilol (COREG) 25 MG tablet Take 1 tablet (25 mg total) by mouth 2 (two) times daily with a meal. 04/11/15   Maryann Mikhail, DO  fish oil-omega-3 fatty acids 1000 MG capsule Take 1 g by mouth daily.      Historical Provider, MD  hydrALAZINE (APRESOLINE) 25 MG tablet Take 1 tablet (25 mg total) by mouth every 8 (eight) hours. 04/11/15   Maryann Mikhail, DO  levothyroxine (SYNTHROID, LEVOTHROID) 75 MCG tablet Take 75-150 mcg by mouth daily. Take 2 tablets on Sunday and then take 1 tablet the other days    Historical Provider, MD  metFORMIN (GLUCOPHAGE) 1000 MG tablet Take 1,000 mg by mouth 2 (two) times daily with a meal.     Historical Provider, MD  naproxen (NAPROSYN) 500 MG tablet Take  500 mg by mouth 2 (two) times daily with a meal.    Historical Provider, MD  predniSONE (DELTASONE) 10 MG tablet Take 40mg  (4 tabs) x 2 days, then taper to 30mg  (3 tabs) x2 days, then 20mg  (2 tabs) x  2days, then 10mg  (1 tab) x 2days, then OFF. 04/11/15   Maryann Mikhail, DO  vitamin B-12 (CYANOCOBALAMIN) 1000 MCG tablet Take 2,000 mcg by mouth daily.     Historical Provider, MD   BP 155/81 mmHg  Pulse 87  Temp(Src) 98 F (36.7 C) (Oral)  Resp 16  Ht 5\' 4"  (1.626 m)  SpO2 97% Physical Exam  Constitutional: She is oriented to person, place, and time. She  appears well-developed and well-nourished. No distress.  HENT:  Head: Normocephalic and atraumatic.  Mouth/Throat: Oropharynx is clear and moist.  Eyes: EOM are normal. Pupils are equal, round, and reactive to light.  Neck: Normal range of motion. Neck supple.  Cardiovascular: Normal rate and regular rhythm.  Exam reveals no gallop and no friction rub.   No murmur heard. Pulmonary/Chest: Effort normal and breath sounds normal. No respiratory distress. She has no wheezes. She has no rales.  Abdominal: Soft. Bowel sounds are normal. She exhibits no distension. There is no tenderness.  Musculoskeletal: Normal range of motion. She exhibits edema.  There is 2-3+ pitting edema of the bilateral lower extremities.  Neurological: She is alert and oriented to person, place, and time. No cranial nerve deficit. She exhibits normal muscle tone. Coordination normal.  Skin: Skin is warm and dry. She is not diaphoretic.  Nursing note and vitals reviewed.   ED Course  Procedures (including critical care time) Labs Review Labs Reviewed  CBC WITH DIFFERENTIAL/PLATELET - Abnormal; Notable for the following:    RBC 3.67 (*)    Hemoglobin 10.0 (*)    HCT 28.2 (*)    MCV 76.8 (*)    Platelets 121 (*)    All other components within normal limits  COMPREHENSIVE METABOLIC PANEL  BRAIN NATRIURETIC PEPTIDE  I-STAT TROPOININ, ED    Imaging Review No results found. I have personally reviewed and evaluated these images and lab results as part of my medical decision-making.   EKG Interpretation   Date/Time:  Saturday May 17 2015 22:11:59 EST Ventricular Rate:  90 PR Interval:  170 QRS Duration: 93 QT Interval:  355 QTC Calculation: 434 R Axis:   70 Text Interpretation:  Sinus rhythm Normal ECG Unchanged from ecg dated  10/16/2012 Confirmed by Sena Hoopingarner  MD, [place] ([id number]) on 05/17/2015 10:51:57 PM      MDM   Final diagnoses:  None    Patient presents here with complaints of weakness and leg  swelling. She has a history of recent admission for the same. Her workup today reveals hyponatremia with a sodium of 122. She also has significant leg edema. She was given IV Lasix. She will be admitted to the hospitalist service under the care of Dr. Tamala Julian for correction of her sodium and possible further diuresis.     Veryl Speak, MD 05/17/15 2152  Veryl Speak, MD 05/17/15 2252

## 2015-05-17 NOTE — H&P (Signed)
[facility] History and Physical  [name] TMH:[id number] DOB: [birth year] DOA: 05/17/2015  Referring physician: ED PCP: [name], MD   Chief Complaint: Nausea and vomiting  HPI: Ms. Emily Kennedy is a 70 year old female with a past medical history significant for leukocytoclastic vasculitis, IgM MGUS, hypertension, diabetes mellitus type 2, hypothyroidism; who presents with nausea and vomiting. Symptoms started this morning. She had 3 episodes of clear vomitus. Patient notes not being able to keep any significant food or liquid down. She has not tried anything to help resolve symptoms. Denies any recent sick contacts. She has several other complaints including her blood pressure being up and down over the last few weeks. Patient notes checking her blood pressure at least 3 times per day and gives examples of highs being 173/83 and lowest being 115/76. Recent medication changes occurred after her last hospitalization in 03/2015. At that time her Coreg was increased from 12.5 mg twice a day to 25 mg twice a day and hydralazine and amlodipine were added for blood pressure control. She notes pain also taken off the gabapentin as it made her feel the and placed on Cymbalta. For the last 2 weeks or so she's also had bilateral leg swelling and worsening rash. She notes that she was previously placed on prednisone for symptoms with resolution, but does not like to take this as it makes her blood sugars elevated. She went to see her primary care provider and had been recently started on Lasix 20 mg which she's been taking for the last 3 days. Does not appear that she ever followed up with a rheumatologist for the vasculitis.  Upon admission patient was evaluated and seen have significant low sodium at 122 and bilateral lower extremity swelling. The ED physician had given the patient 40 mg of Lasix IV 1 dose and recommended admission to the [facility] for further evaluation.  Review of  Systems  Constitutional: Positive for malaise/fatigue. Negative for fever, chills and weight loss.  HENT: Negative for hearing loss and tinnitus.   Eyes: Negative for photophobia and pain.  Respiratory: Negative for shortness of breath.   Cardiovascular: Positive for leg swelling. Negative for chest pain.  Gastrointestinal: Positive for nausea and vomiting.  Genitourinary: Negative for urgency and frequency.  Musculoskeletal: Positive for joint pain. Negative for falls.  Skin: Positive for rash.  Neurological: Negative for sensory change and speech change.  Endo/Heme/Allergies: Negative for environmental allergies. Bruises/bleeds easily.  Psychiatric/Behavioral: Negative for substance abuse.    Past Medical History  Diagnosis Date  . Diabetes mellitus   . Vasculitis ([place])   . Hypertension   . Thyroid disease      Past Surgical History  Procedure Laterality Date  . Tubal ligation    . Melanoma excision        Social History:  reports that she has never smoked. She has never used smokeless tobacco. She reports that she does not drink alcohol or use illicit drugs.   Allergies  Allergen Reactions  . Benadryl [Diphenhydramine] Swelling  . Ciprofloxacin Hives  . Nitrofurantoin Monohyd Macro Hives  . Septra [Bactrim] Hives  . Sulfa Drugs Cross Reactors Hives  . Ceclor [Cefaclor] Rash    Breaks out in rash chest down.    Family History  Problem Relation Age of Onset  . Cancer Sister        Prior to Admission medications   Medication Sig Start Date End Date Taking? Authorizing Provider  acetaminophen (TYLENOL) 500 MG tablet Take 1,000 mg  by mouth every 6 (six) hours as needed (pain).   Yes Historical Provider, MD  acyclovir (ZOVIRAX) 400 MG tablet Take 400 mg by mouth 2 (two) times daily as needed (shingles).    Yes Historical Provider, MD  amLODipine (NORVASC) 5 MG tablet Take 1 tablet (5 mg total) by mouth daily. 04/11/15  Yes Maryann Mikhail, DO  aspirin EC 81 MG  tablet Take 81 mg by mouth daily with supper.    Yes Historical Provider, MD  carvedilol (COREG) 25 MG tablet Take 1 tablet (25 mg total) by mouth 2 (two) times daily with a meal. 04/11/15  Yes Maryann Mikhail, DO  DULoxetine (CYMBALTA) 30 MG capsule Take 30 mg by mouth 2 (two) times daily. 05/16/15  Yes Historical Provider, MD  fish oil-omega-3 fatty acids 1000 MG capsule Take 1 g by mouth daily.     Yes Historical Provider, MD  furosemide (LASIX) 20 MG tablet Take 20 mg by mouth daily. 05/16/15  Yes Historical Provider, MD  Glucos-Chondroit-Boron-C-Mn ([employer] GLUCOS-CHONDROIT TRIPLE ST) TABS Take 2 tablets by mouth daily with supper. 05/08/15  Yes Historical Provider, MD  hydrALAZINE (APRESOLINE) 25 MG tablet Take 1 tablet (25 mg total) by mouth every 8 (eight) hours. 04/11/15  Yes Maryann Mikhail, DO  l-methylfolate-B6-B12 (METANX) 3-35-2 MG TABS tablet Take 1 tablet by mouth 2 (two) times daily.   Yes Historical Provider, MD  levothyroxine (SYNTHROID, LEVOTHROID) 75 MCG tablet Take 75-150 mcg by mouth daily. Take 2 tablets (150 mcg) by mouth on Sundays, take 1 tablet (75 mcg) Monday thru Saturday   Yes Historical Provider, MD  metFORMIN (GLUCOPHAGE) 1000 MG tablet Take 1,000 mg by mouth 2 (two) times daily with a meal.    Yes Historical Provider, MD  benzonatate (TESSALON) 100 MG capsule Take 1 capsule (100 mg total) by mouth 3 (three) times daily as needed for cough. Patient not taking: Reported on 05/17/2015 04/11/15   Velta Addison Mikhail, DO  naproxen (NAPROSYN) 500 MG tablet Take 500 mg by mouth 2 (two) times daily with a meal.    Historical Provider, MD     Physical Exam: Filed Vitals:   05/17/15 2142 05/17/15 2145 05/17/15 2200 05/17/15 2215  BP: 149/72 145/67 140/80 151/72  Pulse: 86 85 85 90  Temp:      TempSrc:      Resp: 16   15  Height:      SpO2: 97% 97% 98% 95%     Constitutional: Vital signs reviewed. Patient is a well-developed and well-nourished in no acute distress and cooperative  with exam. Alert and oriented x3.  Head: Normocephalic and atraumatic  Ear: TM normal bilaterally  Mouth: no erythema or exudates, MMM  Eyes: PERRL, EOMI, conjunctivae normal, No scleral icterus.  Neck: Supple, Trachea midline normal ROM, No JVD, mass, thyromegaly, or carotid bruit present.  Cardiovascular: RRR, S1 normal, S2 normal, no MRG, pulses symmetric and intact bilaterally  Pulmonary/Chest: CTAB, no wheezes, rales, or rhonchi  Abdominal: Soft. Non-tender, non-distended, bowel sounds are normal, no masses, organomegaly, or guarding present.  GU: no CVA tenderness Musculoskeletal: No joint deformities, erythema, or stiffness, ROM full and no nontender Ext: +2 pitting edema bilateral lower extremities and no cyanosis, pulses palpable bilaterally (DP and PT)  Hematology: no cervical, inginal, or axillary adenopathy.  Neurological: A&O x3, Strenght is normal and symmetric bilaterally, cranial nerve II-XII are grossly intact, no focal motor deficit, sensory intact to light touch bilaterally.  Skin: Bilateral palpable like purpura in the lower extremities going  to up to the knees  Psychiatric: Normal mood and affect. speech and behavior is normal. Judgment and thought content normal. Cognition and memory are normal.      Data Review   Micro Results No results found for this or any previous visit (from the past 240 hour(s)).  Radiology Reports Dg Chest 2 View  05/17/2015  CLINICAL DATA:  70 year old female with nausea vomiting and leg swelling. EXAM: CHEST  2 VIEW COMPARISON:  Radiograph dated 04/10/2015 FINDINGS: Two views of the chest do not demonstrate a focal consolidation. There is no pleural effusion or pneumothorax. There is mild diffuse interstitial and vascular prominence likely mild congestive changes. Mild cardiomegaly. No acute osseous pathology. IMPRESSION: Probable mild congestive changes.  No focal consolidation. Electronically Signed   By: Anner Crete M.D.   On:  05/17/2015 20:53     CBC  Recent Labs Lab 05/17/15 1944  WBC 7.7  HGB 10.0*  HCT 28.2*  PLT 121*  MCV 76.8*  MCH 27.2  MCHC 35.5  RDW 15.3  LYMPHSABS 1.0  MONOABS 0.4  EOSABS 0.0  BASOSABS 0.0    Chemistries   Recent Labs Lab 05/17/15 1944  NA 122*  K 4.2  CL 88*  CO2 22  GLUCOSE 115*  BUN 10  CREATININE 0.58  CALCIUM 9.4  AST 26  ALT 20  ALKPHOS 56  BILITOT 0.8   ------------------------------------------------------------------------------------------------------------------ CrCl cannot be calculated (Unknown ideal weight.). ------------------------------------------------------------------------------------------------------------------ No results for input(s): HGBA1C in the last 72 hours. ------------------------------------------------------------------------------------------------------------------ No results for input(s): CHOL, HDL, LDLCALC, TRIG, CHOLHDL, LDLDIRECT in the last 72 hours. ------------------------------------------------------------------------------------------------------------------ No results for input(s): TSH, T4TOTAL, T3FREE, THYROIDAB in the last 72 hours.  Invalid input(s): FREET3 ------------------------------------------------------------------------------------------------------------------ No results for input(s): VITAMINB12, FOLATE, FERRITIN, TIBC, IRON, RETICCTPCT in the last 72 hours.  Coagulation profile No results for input(s): INR, PROTIME in the last 168 hours.  No results for input(s): DDIMER in the last 72 hours.  Cardiac Enzymes No results for input(s): CKMB, TROPONINI, MYOGLOBIN in the last 168 hours.  Invalid input(s): CK ------------------------------------------------------------------------------------------------------------------ Invalid input(s): POCBNP   CBG: No results for input(s): GLUCAP in the last 168 hours.     EKG: Independently reviewed. Normal sinus  rhythm   Assessment/Plan Nausea/ vomiting: Acute. Etiology is unknown at this time. - Admit to a telemetry bed  - Zofran/ promethazine as needed for symptoms  - Advance diet as tolerated  Hyponatremia: Acute. Sodium level 122 on admission. Patient previously seen to have significant hyponatremia in the past suspect symptoms acutely secondary to nausea vomiting with the addition of Lasix recently given for lower extremity edema. - IV fluids of normal saline  - Recheck BMP in a.m. 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 It appears to patient has a misunderstanding of the reason for being placed on steroids and felt to have follow-up with a rheumatologist to be started on steroid sparing agents - Check getting ESR CRP - Solu-Medrol 60 mg daily IV  IgM MGUS - Followed by oncology  Chronic anemia - Patient near baseline hemoglobin as seen to be at 10 g/dL on arrival  Diabetes mellitus type 2: Patient appears to be well controlled on oral medications are metformin. -  Held metformin while hospitalized - CBGs every before meals with a sensitive sliding scale of insulin  Code Status:   full Family Communication: bedside Disposition Plan: admit   Total time spent 55 minutes.Greater than 50% of this time was spent in counseling, explanation of diagnosis, planning of further management, and coordination of care  [facility] Pager [phone]  If 7PM-7AM, please contact night-coverage [email or web address] Password [email or web address] 05/17/2015, 11:01 PM

## 2015-05-18 DIAGNOSIS — I10 Essential (primary) hypertension: Secondary | ICD-10-CM | POA: Diagnosis present

## 2015-05-18 DIAGNOSIS — R112 Nausea with vomiting, unspecified: Secondary | ICD-10-CM | POA: Diagnosis not present

## 2015-05-18 DIAGNOSIS — E039 Hypothyroidism, unspecified: Secondary | ICD-10-CM | POA: Diagnosis present

## 2015-05-18 DIAGNOSIS — E871 Hypo-osmolality and hyponatremia: Principal | ICD-10-CM

## 2015-05-18 DIAGNOSIS — D638 Anemia in other chronic diseases classified elsewhere: Secondary | ICD-10-CM | POA: Diagnosis present

## 2015-05-18 LAB — LIPID PANEL
Cholesterol: 177 mg/dL (ref 0–200)
HDL: 31 mg/dL — AB (ref >40)
LDL CALC: 109 mg/dL — AB (ref 0–99)
TRIGLYCERIDES: 185 mg/dL — AB (ref <150)
Total CHOL/HDL Ratio: 5.7 RATIO
VLDL: 37 mg/dL (ref 0–40)

## 2015-05-18 LAB — BASIC METABOLIC PANEL
Anion gap: 9 (ref 5–15)
BUN: 10 mg/dL (ref 6–20)
CO2: 26 mmol/L (ref 22–32)
CREATININE: 0.49 mg/dL (ref 0.44–1.00)
Calcium: 8.9 mg/dL (ref 8.9–10.3)
Chloride: 89 mmol/L — ABNORMAL LOW (ref 101–111)
GFR calc Af Amer: >60 mL/min (ref >60)
GLUCOSE: 124 mg/dL — AB (ref 65–99)
POTASSIUM: 3.9 mmol/L (ref 3.5–5.1)
SODIUM: 124 mmol/L — AB (ref 135–145)

## 2015-05-18 LAB — GLUCOSE, CAPILLARY
GLUCOSE-CAPILLARY: 188 mg/dL — AB (ref 65–99)
Glucose-Capillary: 109 mg/dL — ABNORMAL HIGH (ref 65–99)
Glucose-Capillary: 236 mg/dL — ABNORMAL HIGH (ref 65–99)
Glucose-Capillary: 203 mg/dL — ABNORMAL HIGH (ref 65–99)

## 2015-05-18 LAB — CBC
HCT: 24.1 % — ABNORMAL LOW (ref 36.0–46.0)
Hemoglobin: 8.8 g/dL — ABNORMAL LOW (ref 12.0–15.0)
MCH: 28.2 pg (ref 26.0–34.0)
MCHC: 36.5 g/dL — AB (ref 30.0–36.0)
MCV: 77.2 fL — AB (ref 78.0–100.0)
Platelets: 120 10*3/uL — ABNORMAL LOW (ref 150–400)
RBC: 3.12 MIL/uL — AB (ref 3.87–5.11)
RDW: 15.6 % — ABNORMAL HIGH (ref 11.5–15.5)
WBC: 7.2 10*3/uL (ref 4.0–10.5)

## 2015-05-18 LAB — SEDIMENTATION RATE: SED RATE: 127 mm/h — AB (ref 0–22)

## 2015-05-18 LAB — C-REACTIVE PROTEIN: CRP: 1.4 mg/dL — ABNORMAL HIGH (ref <1.0)

## 2015-05-18 LAB — TSH: TSH: 34.207 u[IU]/mL — ABNORMAL HIGH (ref 0.350–4.500)

## 2015-05-18 MED ORDER — METHYLPREDNISOLONE SODIUM SUCC 125 MG IJ SOLR
60.0000 mg | Freq: Every day | INTRAMUSCULAR | Status: DC
  Administered 2015-05-18: 60 mg via INTRAVENOUS
  Filled 2015-05-18: qty 2

## 2015-05-18 MED ORDER — FUROSEMIDE 40 MG PO TABS
40.0000 mg | ORAL_TABLET | Freq: Every day | ORAL | Status: DC
  Administered 2015-05-18: 40 mg via ORAL
  Filled 2015-05-18: qty 1

## 2015-05-18 MED ORDER — METHYLPREDNISOLONE SODIUM SUCC 40 MG IJ SOLR
40.0000 mg | Freq: Every day | INTRAMUSCULAR | Status: DC
  Administered 2015-05-19: 40 mg via INTRAVENOUS
  Filled 2015-05-18: qty 1

## 2015-05-18 NOTE — Progress Notes (Addendum)
[facility] PROGRESS NOTE  Lacosta Ax [id number] DOB: [birth year] DOA: 05/17/2015 PCP: [name], MD  Assessment/Plan:  Principal Problem:   Hyponatremia, recurrent. Last admission TSH was 12. May be contributing. Will recheck.  Given the amount of edema, d/c saline Active Problems:   MGUS (monoclonal gammopathy of unknown significance)   Vasculitis ([place]): decrease solumedrol to 40 mg   Nausea and vomiting may be secondary to hyponatremia. Improving, but not resolved   Diabetes mellitus type 2, controlled ([place]): cont current   Bilateral lower extremity edema: myxedema? Amlodipine related? Will also check echo. Albumin ok and UA with only 30 protein, so doubt nephrotic syndrome. Resume lasix   Anemia, chronic disease: monitor   Hypothyroidism: see above   Essential hypertension: continue current   HPI/Subjective: Eating, but some nausea. No dyspnea  Objective: Filed Vitals:   05/17/15 2301 05/18/15 0412  BP: 155/66 150/70  Pulse: 88 91  Temp: 97.9 F (36.6 C) 97.7 F (36.5 C)  Resp: 16 16    Intake/Output Summary (Last 24 hours) at 05/18/15 1250 Last data filed at 05/18/15 0000  Gross per 24 hour  Intake      0 ml  Output    800 ml  Net   -800 ml   Filed Weights   05/17/15 2301 05/18/15 0412  Weight: 67.8 kg (149 lb 7.6 oz) 66.6 kg (146 lb 13.2 oz)    Exam:   General:  Eating lunch  Cardiovascular: RRR without MGR  Respiratory: CTA without WRR  Abdomen: S, NT, ND  Ext: 3+ edema. Palpable purpura.  Basic Metabolic Panel:  Recent Labs Lab 05/17/15 1944 05/18/15 0358  NA 122* 124*  K 4.2 3.9  CL 88* 89*  CO2 22 26  GLUCOSE 115* 124*  BUN 10 10  CREATININE 0.58 0.49  CALCIUM 9.4 8.9   Liver Function Tests:  Recent Labs Lab 05/17/15 1944  AST 26  ALT 20  ALKPHOS 56  BILITOT 0.8  PROT 6.7  ALBUMIN 3.2*   No results for input(s): LIPASE, AMYLASE in the last 168 hours. No results for input(s): AMMONIA in the last 168  hours. CBC:  Recent Labs Lab 05/17/15 1944 05/18/15 0358  WBC 7.7 7.2  NEUTROABS 6.2  --   HGB 10.0* 8.8*  HCT 28.2* 24.1*  MCV 76.8* 77.2*  PLT 121* 120*   Cardiac Enzymes: No results for input(s): CKTOTAL, CKMB, CKMBINDEX, TROPONINI in the last 168 hours. BNP (last 3 results)  Recent Labs  05/17/15 1944  BNP 78.4    ProBNP (last 3 results) No results for input(s): PROBNP in the last 8760 hours.  CBG:  Recent Labs Lab 05/17/15 2309 05/18/15 0615 05/18/15 1212  GLUCAP 121* 188* 109*    No results found for this or any previous visit (from the past 240 hour(s)).   Studies: Dg Chest 2 View  05/17/2015  CLINICAL DATA:  70 year old female with nausea vomiting and leg swelling. EXAM: CHEST  2 VIEW COMPARISON:  Radiograph dated 04/10/2015 FINDINGS: Two views of the chest do not demonstrate a focal consolidation. There is no pleural effusion or pneumothorax. There is mild diffuse interstitial and vascular prominence likely mild congestive changes. Mild cardiomegaly. No acute osseous pathology. IMPRESSION: Probable mild congestive changes.  No focal consolidation. Electronically Signed   By: Anner Crete M.D.   On: 05/17/2015 20:53    Scheduled Meds: . aspirin EC  81 mg Oral Q supper  . carvedilol  25 mg Oral BID WC  . DULoxetine  30 mg Oral BID  . enoxaparin (LOVENOX) injection  40 mg Subcutaneous Daily  . hydrALAZINE  25 mg Oral 3 times per day  . insulin aspart  0-5 Units Subcutaneous QHS  . insulin aspart  0-9 Units Subcutaneous TID WC  . [START ON 05/19/2015] levothyroxine  75 mcg Oral Once per day on Mon Tue Wed Thu Fri Sat   And  . levothyroxine  150 mcg Oral Q Sun  . methylPREDNISolone (SOLU-MEDROL) injection  60 mg Intravenous Daily  . omega-3 acid ethyl esters  1 g Oral Daily  . sodium chloride flush  3 mL Intravenous Q12H   Continuous Infusions: . sodium chloride 75 mL/hr (05/18/15 0618)    Time spent: 35 minutes  [place]  [facility] [email or web address], password [facility] 05/18/2015, 12:50 PM  LOS: 1 day

## 2015-05-19 ENCOUNTER — Observation Stay (HOSPITAL_BASED_OUTPATIENT_CLINIC_OR_DEPARTMENT_OTHER): Payer: Medicare Other

## 2015-05-19 DIAGNOSIS — E039 Hypothyroidism, unspecified: Secondary | ICD-10-CM | POA: Diagnosis not present

## 2015-05-19 DIAGNOSIS — R600 Localized edema: Secondary | ICD-10-CM

## 2015-05-19 DIAGNOSIS — R609 Edema, unspecified: Secondary | ICD-10-CM

## 2015-05-19 DIAGNOSIS — R112 Nausea with vomiting, unspecified: Secondary | ICD-10-CM | POA: Diagnosis not present

## 2015-05-19 DIAGNOSIS — E871 Hypo-osmolality and hyponatremia: Secondary | ICD-10-CM | POA: Diagnosis not present

## 2015-05-19 LAB — BASIC METABOLIC PANEL
ANION GAP: 9 (ref 5–15)
BUN: 8 mg/dL (ref 6–20)
CALCIUM: 8.7 mg/dL — AB (ref 8.9–10.3)
CO2: 25 mmol/L (ref 22–32)
CREATININE: 0.54 mg/dL (ref 0.44–1.00)
Chloride: 89 mmol/L — ABNORMAL LOW (ref 101–111)
Glucose, Bld: 153 mg/dL — ABNORMAL HIGH (ref 65–99)
Potassium: 4 mmol/L (ref 3.5–5.1)
SODIUM: 123 mmol/L — AB (ref 135–145)

## 2015-05-19 LAB — GLUCOSE, CAPILLARY
GLUCOSE-CAPILLARY: 114 mg/dL — AB (ref 65–99)
GLUCOSE-CAPILLARY: 154 mg/dL — AB (ref 65–99)
Glucose-Capillary: 155 mg/dL — ABNORMAL HIGH (ref 65–99)
Glucose-Capillary: 198 mg/dL — ABNORMAL HIGH (ref 65–99)

## 2015-05-19 LAB — SODIUM, URINE, RANDOM: Sodium, Ur: 45 mmol/L

## 2015-05-19 LAB — OSMOLALITY, URINE: OSMOLALITY UR: 307 mosm/kg (ref 300–900)

## 2015-05-19 LAB — IRON AND TIBC
IRON: 48 ug/dL (ref 28–170)
SATURATION RATIOS: 14 % (ref 10.4–31.8)
TIBC: 332 ug/dL (ref 250–450)
UIBC: 284 ug/dL

## 2015-05-19 LAB — FERRITIN: Ferritin: 54 ng/mL (ref 11–307)

## 2015-05-19 LAB — CBC
HCT: 24 % — ABNORMAL LOW (ref 36.0–46.0)
Hemoglobin: 8.6 g/dL — ABNORMAL LOW (ref 12.0–15.0)
MCH: 29 pg (ref 26.0–34.0)
MCHC: 35.8 g/dL (ref 30.0–36.0)
MCV: 80.8 fL (ref 78.0–100.0)
PLATELETS: 131 10*3/uL — AB (ref 150–400)
RBC: 2.97 MIL/uL — AB (ref 3.87–5.11)
RDW: 15.6 % — ABNORMAL HIGH (ref 11.5–15.5)
WBC: 5 10*3/uL (ref 4.0–10.5)

## 2015-05-19 LAB — OSMOLALITY: OSMOLALITY: 253 mosm/kg — AB (ref 275–295)

## 2015-05-19 MED ORDER — LEVOTHYROXINE SODIUM 75 MCG PO TABS
150.0000 ug | ORAL_TABLET | Freq: Every day | ORAL | Status: DC
  Administered 2015-05-19: 75 ug via ORAL
  Administered 2015-05-20: 150 ug via ORAL
  Filled 2015-05-19 (×2): qty 2

## 2015-05-19 MED ORDER — FUROSEMIDE 40 MG PO TABS
40.0000 mg | ORAL_TABLET | Freq: Every day | ORAL | Status: DC
  Administered 2015-05-19 – 2015-05-20 (×2): 40 mg via ORAL
  Filled 2015-05-19 (×2): qty 1

## 2015-05-19 MED ORDER — PREDNISONE 20 MG PO TABS
40.0000 mg | ORAL_TABLET | Freq: Every day | ORAL | Status: DC
  Administered 2015-05-20: 40 mg via ORAL
  Filled 2015-05-19: qty 2

## 2015-05-19 NOTE — Progress Notes (Signed)
[facility] PROGRESS NOTE  Emily Kennedy [id number] DOB: 1945/06/15 DOA: 05/17/2015 PCP: [name], MD  Assessment/Plan:  Principal Problem:   Hyponatremia, recurrent. TSH 34. Likely the cause. Increase synthroid.  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  Echo pending. Albumin ok and UA with only 30 protein, so doubt nephrotic syndrome. Resumed lasix   Anemia, chronic disease: monitor   Hypothyroidism: see above   Essential hypertension: continue current   HPI/Subjective: Eating, but some nausea. No dyspnea  Objective: Filed Vitals:   05/19/15 0645 05/19/15 0853  BP: 152/70 124/55  Pulse: 82 83  Temp: 97.8 F (36.6 C) 98.4 F (36.9 C)  Resp: 18 20    Intake/Output Summary (Last 24 hours) at 05/19/15 1444 Last data filed at 05/19/15 0940  Gross per 24 hour  Intake    363 ml  Output    750 ml  Net   -387 ml   Filed Weights   05/17/15 2301 05/18/15 0412 05/19/15 0645  Weight: 67.8 kg (149 lb 7.6 oz) 66.6 kg (146 lb 13.2 oz) 66.134 kg (145 lb 12.8 oz)    Exam:   General:  Comfortable lying flat  Cardiovascular: RRR without MGR  Respiratory: CTA without WRR  Abdomen: S, NT, ND  Ext: 2+ edema. Palpable purpura. Ted hose on  Basic Metabolic Panel:  Recent Labs Lab 05/17/15 1944 05/18/15 0358 05/19/15 0223  NA 122* 124* 123*  K 4.2 3.9 4.0  CL 88* 89* 89*  CO2 22 26 25   GLUCOSE 115* 124* 153*  BUN 10 10 8   CREATININE 0.58 0.49 0.54  CALCIUM 9.4 8.9 8.7*   Liver Function Tests:  Recent Labs Lab 05/17/15 1944  AST 26  ALT 20  ALKPHOS 56  BILITOT 0.8  PROT 6.7  ALBUMIN 3.2*   No results for input(s): LIPASE, AMYLASE in the last 168 hours. No  results for input(s): AMMONIA in the last 168 hours. CBC:  Recent Labs Lab 05/17/15 1944 05/18/15 0358 05/19/15 0223  WBC 7.7 7.2 5.0  NEUTROABS 6.2  --   --   HGB 10.0* 8.8* 8.6*  HCT 28.2* 24.1* 24.0*  MCV 76.8* 77.2* 80.8  PLT 121* 120* 131*   Cardiac Enzymes: No results for input(s): CKTOTAL, CKMB, CKMBINDEX, TROPONINI in the last 168 hours. BNP (last 3 results)  Recent Labs  05/17/15 1944  BNP 78.4    ProBNP (last 3 results) No results for input(s): PROBNP in the last 8760 hours.  CBG:  Recent Labs Lab 05/18/15 1212 05/18/15 1606 05/18/15 2104 05/19/15 0619 05/19/15 1203  GLUCAP 109* 236* 203* 114* 155*    No results found for this or any previous visit (from the past 240 hour(s)).   Studies: Dg Chest 2 View  05/17/2015  CLINICAL DATA:  70 year old female with nausea vomiting and leg swelling. EXAM: CHEST  2 VIEW COMPARISON:  Radiograph dated 04/10/2015 FINDINGS: Two views of the chest do not demonstrate a focal consolidation. There is no pleural effusion or pneumothorax. There is mild diffuse interstitial and vascular prominence likely mild congestive changes. Mild cardiomegaly. No acute osseous pathology. IMPRESSION: Probable mild congestive changes.  No focal consolidation. Electronically Signed   By: Anner Crete M.D.   On: 05/17/2015 20:53    Scheduled  Meds: . aspirin EC  81 mg Oral Q supper  . carvedilol  25 mg Oral BID WC  . DULoxetine  30 mg Oral BID  . enoxaparin (LOVENOX) injection  40 mg Subcutaneous Daily  . hydrALAZINE  25 mg Oral 3 times per day  . insulin aspart  0-5 Units Subcutaneous QHS  . insulin aspart  0-9 Units Subcutaneous TID WC  . levothyroxine  150 mcg Oral QAC breakfast  . methylPREDNISolone (SOLU-MEDROL) injection  40 mg Intravenous Daily  . omega-3 acid ethyl esters  1 g Oral Daily  . sodium chloride flush  3 mL Intravenous Q12H   Continuous Infusions:    Time spent: 25 minutes  [place]  [facility] [email or web address], password [facility] 05/19/2015, 2:44 PM  LOS: 2 days

## 2015-05-19 NOTE — Care Management Obs Status (Signed)
[place] NOTIFICATION   Patient Details  Name: Emily Kennedy MRN: [id number] Date of Birth: [birth year]   Medicare Observation Status Notification Given:  Yes    Erenest Rasher, RN 05/19/2015, 1:52 PM

## 2015-05-19 NOTE — Progress Notes (Signed)
  Echocardiogram 2D Echocardiogram has been performed.  Emily Kennedy 05/19/2015, 1:43 PM

## 2015-05-19 NOTE — Care Management Note (Signed)
Case Management Note  Patient Details  Name: Emily Kennedy MRN: [id number] Date of Birth: 1945-12-28  Subjective/Objective:    Hyponatremia,   N/V, MGUS             Action/Plan: NCM spoke to pt and states she lives in home with dtr, Haroldine Laws. States she has RW, and wheelchair at home. Pt is active with [facility] RN, PT, OT and aide. Will need resumption of care orders if is to dc home with [facility]   Expected Discharge Date:  05/20/15               Expected Discharge Plan:  [place]  In-House Referral:  NA  Discharge planning Services  CM Consult  Post Acute Care Choice:  Home Health, Resumption of Svcs/PTA Provider Choice offered to:  Patient  DME Arranged:  N/A DME Agency:  NA  HH Arranged:  Nurse's Aide, OT, PT, RN [place] Agency:  [place]  Status of Service:  Completed, signed off  Medicare Important Message Given:    Date Medicare IM Given:    Medicare IM give by:    Date Additional Medicare IM Given:    Additional Medicare Important Message give by:     If discussed at [place] of Stay Meetings, dates discussed:    Additional Comments:  Erenest Rasher, RN 05/19/2015, 2:17 PM

## 2015-05-20 DIAGNOSIS — E871 Hypo-osmolality and hyponatremia: Secondary | ICD-10-CM | POA: Diagnosis not present

## 2015-05-20 DIAGNOSIS — R112 Nausea with vomiting, unspecified: Secondary | ICD-10-CM | POA: Diagnosis not present

## 2015-05-20 LAB — BASIC METABOLIC PANEL
ANION GAP: 10 (ref 5–15)
BUN: 9 mg/dL (ref 6–20)
CALCIUM: 9 mg/dL (ref 8.9–10.3)
CO2: 26 mmol/L (ref 22–32)
Chloride: 90 mmol/L — ABNORMAL LOW (ref 101–111)
Creatinine, Ser: 0.47 mg/dL (ref 0.44–1.00)
GFR calc Af Amer: >60 mL/min (ref >60)
GFR calc non Af Amer: >60 mL/min (ref >60)
GLUCOSE: 137 mg/dL — AB (ref 65–99)
Potassium: 3.9 mmol/L (ref 3.5–5.1)
Sodium: 126 mmol/L — ABNORMAL LOW (ref 135–145)

## 2015-05-20 LAB — GLUCOSE, CAPILLARY
Glucose-Capillary: 126 mg/dL — ABNORMAL HIGH (ref 65–99)
Glucose-Capillary: 229 mg/dL — ABNORMAL HIGH (ref 65–99)

## 2015-05-20 MED ORDER — PREDNISONE 20 MG PO TABS: 40.0000 mg | ORAL_TABLET | Freq: Every day | ORAL | Status: DC

## 2015-05-20 MED ORDER — FUROSEMIDE 20 MG PO TABS: 40.0000 mg | ORAL_TABLET | Freq: Every day | ORAL | Status: DC

## 2015-05-20 MED ORDER — LEVOTHYROXINE SODIUM 150 MCG PO TABS: 150.0000 ug | ORAL_TABLET | Freq: Every day | ORAL | Status: DC

## 2015-05-20 NOTE — Progress Notes (Signed)
Contacted [facility] to make aware of scheduled dc home with HH, resumption of care. NCM spoke to pt and son at bedside. Pt wants to go to SNF-rehab. Explained she would have to pay for rehab out of pocket. She did not have a 3 night qualifying IP stay with Medicare. Explained to son that they can follow up with pt applying for Medicaid. Jonnie Finner RN CCM Case Mgmt phone [phone]

## 2015-05-20 NOTE — Evaluation (Signed)
Physical Therapy Evaluation Patient Details Name: Emily Kennedy MRN: [id number] DOB: Aug 03, 1945 Today's Date: 05/20/2015   History of Present Illness  Patient is a 70 y/o female with hx of for leukocytoclastic vasculitis, IgM MGUS, hypertension, diabetes mellitus type 2, hypothyroidism presents with N/V and weakness. Found to have hyponatremia and BLE swelling.  Clinical Impression  Patient presents with decreased strength, foot drop RLE, decreased balance and endurance impacting safe mobility. Pt only ambulates at home when someone is present, otherwise is Mod I using w/c for mobility. Tolerated ambulation with min guard assist for safety. Pt has support from son and daughter at home. Will follow acutely to maximize independence and mobility prior to return home.    Follow Up Recommendations Home health PT;Supervision for mobility/OOB    Equipment Recommendations  None recommended by PT    Recommendations for Other Services       Precautions / Restrictions Precautions Precautions: Fall Restrictions Weight Bearing Restrictions: No      Mobility  Bed Mobility               General bed mobility comments: Sitting on BSC upon PT Arrival.   Transfers Overall transfer level: Needs assistance Equipment used: Rolling walker (2 wheeled);None Transfers: Sit to/from Stand Sit to Stand: Min assist Stand pivot transfers: Min guard       General transfer comment: Assist to steady upon standing. SPT BSC to bed and bed to chair with Min guard for safety. Pt very careful and holding on during transfer.  Ambulation/Gait Ambulation/Gait assistance: Min guard Ambulation Distance (Feet): 150 Feet (x2 bouts) Assistive device: Rolling walker (2 wheeled) Gait Pattern/deviations: Step-through pattern;Decreased stride length;Decreased dorsiflexion - right;Decreased dorsiflexion - left;Trunk flexed   Gait velocity interpretation: Below normal speed for age/gender General Gait Details:  Slow, steady gait with foot drop RLE worse than LLE. 1 seated rest break.  Stairs            Wheelchair Mobility    Modified Rankin (Stroke Patients Only)       Balance Overall balance assessment: Needs assistance Sitting-balance support: Feet supported;No upper extremity supported Sitting balance-Leahy Scale: Good     Standing balance support: During functional activity Standing balance-Leahy Scale: Fair Standing balance comment: Able to perform SPT with at least 1 UE support.                              Pertinent Vitals/Pain Pain Assessment: No/denies pain    Home Living Family/patient expects to be discharged to:: Private residence Living Arrangements: Children Available Help at Discharge: Family;Available 24 hours/day Type of Home: House Home Access: Level entry     Home Layout: One level Home Equipment: Walker - 2 wheels;Cane - single point;Wheelchair - manual;Other (comment) (already ordered tub bench)      Prior Function Level of Independence: Needs assistance   Gait / Transfers Assistance Needed: Only ambulates when someone is with her with RW; using w/c for mobility more frequently due to weakness and fear of falls. No falls reported.   ADL's / Homemaking Assistance Needed: Mod I for ADLs.        Hand Dominance   Dominant Hand: Right    Extremity/Trunk Assessment   Upper Extremity Assessment: Defer to OT evaluation           Lower Extremity Assessment: Generalized weakness;RLE deficits/detail;LLE deficits/detail RLE Deficits / Details: Grossly ~1/5 DF; 3+/5 quads LLE Deficits / Details: Grossly ~1/5 DF,  3+/5 quads     Communication   Communication: No difficulties  Cognition Arousal/Alertness: Awake/alert Behavior During Therapy: WFL for tasks assessed/performed Overall Cognitive Status: Within Functional Limits for tasks assessed                      General Comments General comments (skin integrity, edema,  etc.): Son present during session.     Exercises        Assessment/Plan    PT Assessment Patient needs continued PT services  PT Diagnosis Difficulty walking;Generalized weakness   PT Problem List Decreased strength;Decreased range of motion;Decreased activity tolerance;Decreased balance;Decreased mobility  PT Treatment Interventions Balance training;Gait training;Functional mobility training;Therapeutic activities;Therapeutic exercise;Patient/family education;Wheelchair mobility training   PT Goals (Current goals can be found in the Care Plan section) Acute Rehab PT Goals Patient Stated Goal: to be able to walk long enough to go shopping PT Goal Formulation: With patient Time For Goal Achievement: 06/03/15 Potential to Achieve Goals: Good    Frequency Min 3X/week   Barriers to discharge        Co-evaluation               End of Session Equipment Utilized During Treatment: Gait belt Activity Tolerance: Patient tolerated treatment well Patient left: in chair;with call bell/phone within reach;with family/visitor present Nurse Communication: Mobility status    Functional Assessment Tool Used: Clinical judgment Functional Limitation: Mobility: Walking and moving around Mobility: Walking and Moving Around Current Status [phone]): At least 20 percent but less than 40 percent impaired, limited or restricted Mobility: Walking and Moving Around Goal Status [phone]): At least 1 percent but less than 20 percent impaired, limited or restricted    Time: 1331-1355 PT Time Calculation (min) (ACUTE ONLY): 24 min   Charges:   PT Evaluation $PT Eval Moderate Complexity: 1 Procedure PT Treatments $Gait Training: 8-22 mins   PT G Codes:   PT G-Codes **NOT FOR INPATIENT CLASS** Functional Assessment Tool Used: Clinical judgment Functional Limitation: Mobility: Walking and moving around Mobility: Walking and Moving Around Current Status [id number]): At least 20 percent but less than 40  percent impaired, limited or restricted Mobility: Walking and Moving Around Goal Status [phone]): At least 1 percent but less than 20 percent impaired, limited or restricted    [place] 05/20/2015, 2:07 PM Wray Kearns, [place], DPT [phone]

## 2015-05-20 NOTE — Discharge Summary (Signed)
Physician Discharge Summary  Emily Kennedy [id number] DOB: 12-06-1945 DOA: 05/17/2015  PCP: [name], MD  Admit date: 05/17/2015 Discharge date: 05/20/2015  Time spent: greater than 30 minutes  Recommendations for Outpatient Follow-up:  1. Monitor sodium 2. Home PT, OT, RN 3. Repeat TSH 2 months 4. Monitor h/h 5. Monitor weights 6. Follow up with rheumatology to establish care   Discharge Diagnoses:  Principal Problem:   Hyponatremia: Active Problems:   MGUS (monoclonal gammopathy of unknown significance)   Vasculitis (H[facility])   Nausea and vomiting   Diabetes mellitus type 2, controlled (HCC)   Bilateral lower extremity edema   Anemia, chronic disease   Hypothyroidism   Essential hypertension Diastolic dysfunction   Discharge Condition: stable  Diet recommendation: diabetic  Filed Weights   05/17/15 2301 05/18/15 0412 05/19/15 0645  Weight: 67.8 kg (149 lb 7.6 oz) 66.6 kg (146 lb 13.2 oz) 66.134 kg (145 lb 12.8 oz)    History of present illness:  70 year old female with a past medical history significant for leukocytoclastic vasculitis, IgM MGUS, hypertension, diabetes mellitus type 2, hypothyroidism; who presents with nausea and vomiting. Symptoms started this morning. She had 3 episodes of clear vomitus. Patient notes not being able to keep any significant food or liquid down. She has not tried anything to help resolve symptoms. Denies any recent sick contacts. She has several other complaints including her blood pressure being up and down over the last few weeks. Patient notes checking her blood pressure at least 3 times per day and gives examples of highs being 173/83 and lowest being 115/76. Recent medication changes occurred after her last hospitalization in 03/2015. At that time her Coreg was increased from 12.5 mg twice a day to 25 mg twice a day and hydralazine and amlodipine were added for blood pressure control. She notes pain also taken off the gabapentin as  it made her feel the and placed on Cymbalta. For the last 2 weeks or so she's also had bilateral leg swelling and worsening rash. She notes that she was previously placed on prednisone for symptoms with resolution, but does not like to take this as it makes her blood sugars elevated. She went to see her primary care provider and had been recently started on Lasix 20 mg which she's been taking for the last 3 days. Does not appear that she ever followed up with a rheumatologist for the vasculitis.  Upon admission patient was evaluated and seen have significant low sodium at 122 and bilateral lower extremity swelling. The ED physician had given the patient 40 mg of Lasix IV 1 dose   Hospital Course:   Hyponatremia, recurrent. Had during last admission as well. TSH 34. Likely the cause. Increased synthroid. at discharge, sodium 126 and asymptomatic. Will likely improve with increased synthroid dose   leukocytoclastic Vasculitis ([place]): started on solumedrol then transitioned to prednisone for short course.  Has not followed up with rheumatology for some time. Will need to re-establish care. Ted hose placed.   Nausea and vomiting may be secondary to hyponatremia. By discharge, tolerating regular diet   Diabetes mellitus type 2, remained stable   Bilateral lower extremity edema: likely mutlifactorial: myxedema Amlodipine related?diastolic dysfunction.  Echo with normal EF. Albumin ok and UA with only 30 protein, so doubt nephrotic syndrome. Lasix increased to 40 mg daily.   Anemia, chronic disease: no evidence of bleeding   Hypothyroidism: see above   Essential hypertension: have stopped amlodipine and increased hydralazine in an effort to improve  swelling  Procedures: none  Consultations: none  Discharge Exam: Filed Vitals:   05/20/15 0103 05/20/15 0500  BP: 155/68 157/67  Pulse: 82 80  Temp:  97.9 F (36.6 C)  Resp:  18    General: a and o Cardiovascular: RRR Respiratory:  CTA Ext 2 + edema. Palpable purpura  Discharge Instructions   Discharge Instructions    Activity as tolerated - No restrictions    Complete by:  As directed      Diet Carb Modified    Complete by:  As directed           Current Discharge Medication List    START taking these medications   Details  predniSONE (DELTASONE) 20 MG tablet Take 2 tablets (40 mg total) by mouth daily with breakfast. Until gone Qty: 3 tablet, Refills: 0      CONTINUE these medications which have CHANGED   Details  furosemide (LASIX) 20 MG tablet Take 2 tablets (40 mg total) by mouth daily. Qty: 30 tablet, Refills: 0    levothyroxine (SYNTHROID, LEVOTHROID) 150 MCG tablet Take 1 tablet (150 mcg total) by mouth daily. Take 2 tablets (150 mcg) by mouth on Sundays, take 1 tablet (75 mcg) Monday thru Saturday Qty: 30 tablet, Refills: 1      CONTINUE these medications which have NOT CHANGED   Details  acetaminophen (TYLENOL) 500 MG tablet Take 1,000 mg by mouth every 6 (six) hours as needed (pain).    aspirin EC 81 MG tablet Take 81 mg by mouth daily with supper.     carvedilol (COREG) 25 MG tablet Take 1 tablet (25 mg total) by mouth 2 (two) times daily with a meal. Qty: 60 tablet, Refills: 0    DULoxetine (CYMBALTA) 30 MG capsule Take 30 mg by mouth 2 (two) times daily.    fish oil-omega-3 fatty acids 1000 MG capsule Take 1 g by mouth daily.      Glucos-Chondroit-Boron-C-Mn ([employer] GLUCOS-CHONDROIT TRIPLE ST) TABS Take 2 tablets by mouth daily with supper. Refills: 6    hydrALAZINE (APRESOLINE) 25 MG tablet Take 1 tablet (25 mg total) by mouth every 8 (eight) hours. Qty: 90 tablet, Refills: 0    l-methylfolate-B6-B12 (METANX) 3-35-2 MG TABS tablet Take 1 tablet by mouth 2 (two) times daily.    metFORMIN (GLUCOPHAGE) 1000 MG tablet Take 1,000 mg by mouth 2 (two) times daily with a meal.       STOP taking these medications     acyclovir (ZOVIRAX) 400 MG tablet      amLODipine (NORVASC) 5 MG  tablet      benzonatate (TESSALON) 100 MG capsule      naproxen (NAPROSYN) 500 MG tablet        Allergies  Allergen Reactions  . Benadryl [Diphenhydramine] Swelling  . Ciprofloxacin Hives  . Nitrofurantoin Monohyd Macro Hives  . Septra [Bactrim] Hives  . Sulfa Drugs Cross Reactors Hives  . Ceclor [Cefaclor] Rash    Breaks out in rash chest down.   Follow-up Information    Follow up with CAMPBELL,JAMES, MD.   Specialty:  Internal Medicine       The results of significant diagnostics from this hospitalization (including imaging, microbiology, ancillary and laboratory) are listed below for reference.    Significant Diagnostic Studies: Dg Chest 2 View  05/17/2015  CLINICAL DATA:  70 year old female with nausea vomiting and leg swelling. EXAM: CHEST  2 VIEW COMPARISON:  Radiograph dated 04/10/2015 FINDINGS: Two views of the chest do not demonstrate  a focal consolidation. There is no pleural effusion or pneumothorax. There is mild diffuse interstitial and vascular prominence likely mild congestive changes. Mild cardiomegaly. No acute osseous pathology. IMPRESSION: Probable mild congestive changes.  No focal consolidation. Electronically Signed   By: Anner Crete M.D.   On: 05/17/2015 20:53   Echo Left ventricle: The cavity size was normal. Wall thickness was  increased in a pattern of mild LVH. Systolic function was normal.  The estimated ejection fraction was in the range of 60% to 65%.  Features are consistent with a pseudonormal left ventricular  filling pattern, with concomitant abnormal relaxation and  increased filling pressure (grade 2 diastolic dysfunction). - Left atrium: The atrium was moderately dilated. - Pericardium, extracardiac: A trivial pericardial effusion was  identified.  Microbiology: No results found for this or any previous visit (from the past 240 hour(s)).   Labs: Basic Metabolic Panel:  Recent Labs Lab 05/17/15 1944 05/18/15 0358  05/19/15 0223 05/20/15 0259  NA 122* 124* 123* 126*  K 4.2 3.9 4.0 3.9  CL 88* 89* 89* 90*  CO2 22 26 25 26   GLUCOSE 115* 124* 153* 137*  BUN 10 10 8 9   CREATININE 0.58 0.49 0.54 0.47  CALCIUM 9.4 8.9 8.7* 9.0   Liver Function Tests:  Recent Labs Lab 05/17/15 1944  AST 26  ALT 20  ALKPHOS 56  BILITOT 0.8  PROT 6.7  ALBUMIN 3.2*   No results for input(s): LIPASE, AMYLASE in the last 168 hours. No results for input(s): AMMONIA in the last 168 hours. CBC:  Recent Labs Lab 05/17/15 1944 05/18/15 0358 05/19/15 0223  WBC 7.7 7.2 5.0  NEUTROABS 6.2  --   --   HGB 10.0* 8.8* 8.6*  HCT 28.2* 24.1* 24.0*  MCV 76.8* 77.2* 80.8  PLT 121* 120* 131*   Cardiac Enzymes: No results for input(s): CKTOTAL, CKMB, CKMBINDEX, TROPONINI in the last 168 hours. BNP: BNP (last 3 results)  Recent Labs  05/17/15 1944  BNP 78.4    ProBNP (last 3 results) No results for input(s): PROBNP in the last 8760 hours.  CBG:  Recent Labs Lab 05/19/15 1203 05/19/15 1654 05/19/15 2120 05/20/15 0636 05/20/15 1208  GLUCAP 155* 198* 154* 126* 229*       Signed:  Delfina Redwood MD.  [facility] 05/20/2015, 12:35 PM

## 2015-05-20 NOTE — Progress Notes (Signed)
*  PRELIMINARY RESULTS* Vascular Ultrasound Lower extremity venous duplex has been completed.  Preliminary findings: No evidence of DVT or baker's cyst.   Landry Mellow, RDMS, RVT

## 2015-05-20 NOTE — Progress Notes (Signed)
Removed Iv. Reviewed discharge instructions. Escorted out via  Radio broadcast assistant.  Kupono Marling, Mervin Kung RN

## 2015-06-13 ENCOUNTER — Inpatient Hospital Stay (HOSPITAL_COMMUNITY)
Admission: EM | Admit: 2015-06-13 | Discharge: 2015-06-23 | DRG: 871 | Disposition: A | Discharge status: Other Acute Inpatient Hospital | Payer: Medicare Other | Attending: Internal Medicine | Admitting: Internal Medicine

## 2015-06-13 ENCOUNTER — Emergency Department (HOSPITAL_COMMUNITY): Payer: Medicare Other

## 2015-06-13 ENCOUNTER — Encounter (HOSPITAL_COMMUNITY): Payer: Self-pay | Admitting: Emergency Medicine

## 2015-06-13 DIAGNOSIS — E039 Hypothyroidism, unspecified: Secondary | ICD-10-CM | POA: Diagnosis present

## 2015-06-13 DIAGNOSIS — A419 Sepsis, unspecified organism: Principal | ICD-10-CM | POA: Diagnosis present

## 2015-06-13 DIAGNOSIS — Z7952 Long term (current) use of systemic steroids: Secondary | ICD-10-CM

## 2015-06-13 DIAGNOSIS — B370 Candidal stomatitis: Secondary | ICD-10-CM | POA: Diagnosis present

## 2015-06-13 DIAGNOSIS — I4891 Unspecified atrial fibrillation: Secondary | ICD-10-CM | POA: Diagnosis not present

## 2015-06-13 DIAGNOSIS — J189 Pneumonia, unspecified organism: Secondary | ICD-10-CM | POA: Diagnosis not present

## 2015-06-13 DIAGNOSIS — Z4659 Encounter for fitting and adjustment of other gastrointestinal appliance and device: Secondary | ICD-10-CM

## 2015-06-13 DIAGNOSIS — G92 Toxic encephalopathy: Secondary | ICD-10-CM | POA: Diagnosis present

## 2015-06-13 DIAGNOSIS — R1310 Dysphagia, unspecified: Secondary | ICD-10-CM | POA: Diagnosis present

## 2015-06-13 DIAGNOSIS — I10 Essential (primary) hypertension: Secondary | ICD-10-CM | POA: Diagnosis not present

## 2015-06-13 DIAGNOSIS — E1165 Type 2 diabetes mellitus with hyperglycemia: Secondary | ICD-10-CM | POA: Diagnosis present

## 2015-06-13 DIAGNOSIS — R4182 Altered mental status, unspecified: Secondary | ICD-10-CM

## 2015-06-13 DIAGNOSIS — R29898 Other symptoms and signs involving the musculoskeletal system: Secondary | ICD-10-CM

## 2015-06-13 DIAGNOSIS — J9601 Acute respiratory failure with hypoxia: Secondary | ICD-10-CM | POA: Diagnosis present

## 2015-06-13 DIAGNOSIS — I5033 Acute on chronic diastolic (congestive) heart failure: Secondary | ICD-10-CM | POA: Diagnosis present

## 2015-06-13 DIAGNOSIS — E871 Hypo-osmolality and hyponatremia: Secondary | ICD-10-CM | POA: Diagnosis present

## 2015-06-13 DIAGNOSIS — E118 Type 2 diabetes mellitus with unspecified complications: Secondary | ICD-10-CM | POA: Diagnosis present

## 2015-06-13 DIAGNOSIS — IMO0002 Reserved for concepts with insufficient information to code with codable children: Secondary | ICD-10-CM | POA: Diagnosis present

## 2015-06-13 DIAGNOSIS — Z5181 Encounter for therapeutic drug level monitoring: Secondary | ICD-10-CM | POA: Diagnosis not present

## 2015-06-13 DIAGNOSIS — R0602 Shortness of breath: Secondary | ICD-10-CM | POA: Diagnosis present

## 2015-06-13 DIAGNOSIS — Z7984 Long term (current) use of oral hypoglycemic drugs: Secondary | ICD-10-CM | POA: Diagnosis not present

## 2015-06-13 DIAGNOSIS — Y95 Nosocomial condition: Secondary | ICD-10-CM | POA: Diagnosis present

## 2015-06-13 DIAGNOSIS — Z79899 Other long term (current) drug therapy: Secondary | ICD-10-CM | POA: Diagnosis not present

## 2015-06-13 DIAGNOSIS — D472 Monoclonal gammopathy: Secondary | ICD-10-CM | POA: Diagnosis present

## 2015-06-13 DIAGNOSIS — G9340 Encephalopathy, unspecified: Secondary | ICD-10-CM | POA: Diagnosis not present

## 2015-06-13 DIAGNOSIS — I110 Hypertensive heart disease with heart failure: Secondary | ICD-10-CM | POA: Diagnosis present

## 2015-06-13 DIAGNOSIS — A481 Legionnaires' disease: Secondary | ICD-10-CM | POA: Diagnosis present

## 2015-06-13 DIAGNOSIS — E860 Dehydration: Secondary | ICD-10-CM | POA: Diagnosis present

## 2015-06-13 DIAGNOSIS — Z809 Family history of malignant neoplasm, unspecified: Secondary | ICD-10-CM

## 2015-06-13 DIAGNOSIS — Z7982 Long term (current) use of aspirin: Secondary | ICD-10-CM | POA: Diagnosis not present

## 2015-06-13 DIAGNOSIS — J154 Pneumonia due to other streptococci: Secondary | ICD-10-CM | POA: Diagnosis present

## 2015-06-13 DIAGNOSIS — R410 Disorientation, unspecified: Secondary | ICD-10-CM | POA: Diagnosis not present

## 2015-06-13 DIAGNOSIS — R4702 Dysphasia: Secondary | ICD-10-CM | POA: Diagnosis present

## 2015-06-13 DIAGNOSIS — I5032 Chronic diastolic (congestive) heart failure: Secondary | ICD-10-CM | POA: Diagnosis present

## 2015-06-13 DIAGNOSIS — R000 Tachycardia, unspecified: Secondary | ICD-10-CM

## 2015-06-13 DIAGNOSIS — D638 Anemia in other chronic diseases classified elsewhere: Secondary | ICD-10-CM | POA: Diagnosis present

## 2015-06-13 DIAGNOSIS — Z85820 Personal history of malignant melanoma of skin: Secondary | ICD-10-CM

## 2015-06-13 DIAGNOSIS — E876 Hypokalemia: Secondary | ICD-10-CM | POA: Diagnosis present

## 2015-06-13 DIAGNOSIS — I776 Arteritis, unspecified: Secondary | ICD-10-CM

## 2015-06-13 DIAGNOSIS — E038 Other specified hypothyroidism: Secondary | ICD-10-CM | POA: Diagnosis present

## 2015-06-13 LAB — COMPREHENSIVE METABOLIC PANEL
ALBUMIN: 2.6 g/dL — AB (ref 3.5–5.0)
ALK PHOS: 42 U/L (ref 38–126)
ALT: 21 U/L (ref 14–54)
AST: 43 U/L — ABNORMAL HIGH (ref 15–41)
Anion gap: 12 (ref 5–15)
BILIRUBIN TOTAL: 1.3 mg/dL — AB (ref 0.3–1.2)
BUN: 17 mg/dL (ref 6–20)
CALCIUM: 8.9 mg/dL (ref 8.9–10.3)
CO2: 19 mmol/L — AB (ref 22–32)
CREATININE: 0.73 mg/dL (ref 0.44–1.00)
Chloride: 98 mmol/L — ABNORMAL LOW (ref 101–111)
GFR calc non Af Amer: >60 mL/min (ref >60)
GLUCOSE: 167 mg/dL — AB (ref 65–99)
Potassium: 3.8 mmol/L (ref 3.5–5.1)
SODIUM: 129 mmol/L — AB (ref 135–145)
TOTAL PROTEIN: 6.2 g/dL — AB (ref 6.5–8.1)

## 2015-06-13 LAB — CBC WITH DIFFERENTIAL/PLATELET
BASOS ABS: 0 10*3/uL (ref 0.0–0.1)
BASOS PCT: 0 %
EOS ABS: 0 10*3/uL (ref 0.0–0.7)
Eosinophils Relative: 0 %
HCT: 28.7 % — ABNORMAL LOW (ref 36.0–46.0)
Hemoglobin: 10 g/dL — ABNORMAL LOW (ref 12.0–15.0)
Lymphocytes Relative: 28 %
Lymphs Abs: 1.1 10*3/uL (ref 0.7–4.0)
MCH: 31.2 pg (ref 26.0–34.0)
MCHC: 34.8 g/dL (ref 30.0–36.0)
MCV: 89.4 fL (ref 78.0–100.0)
MONO ABS: 0.4 10*3/uL (ref 0.1–1.0)
MONOS PCT: 9 %
Neutro Abs: 2.5 10*3/uL (ref 1.7–7.7)
Neutrophils Relative %: 63 %
PLATELETS: 178 10*3/uL (ref 150–400)
RBC: 3.21 MIL/uL — ABNORMAL LOW (ref 3.87–5.11)
RDW: 17.4 % — AB (ref 11.5–15.5)
WBC: 4 10*3/uL (ref 4.0–10.5)

## 2015-06-13 LAB — I-STAT VENOUS BLOOD GAS, ED
ACID-BASE DEFICIT: 1 mmol/L (ref 0.0–2.0)
Bicarbonate: 22 mEq/L (ref 20.0–24.0)
O2 Saturation: 92 %
PH VEN: 7.43 — AB (ref 7.250–7.300)
PO2 VEN: 61 mmHg — AB (ref 31.0–45.0)
TCO2: 23 mmol/L (ref 0–100)
pCO2, Ven: 33.1 mmHg — ABNORMAL LOW (ref 45.0–50.0)

## 2015-06-13 LAB — I-STAT CG4 LACTIC ACID, ED: Lactic Acid, Venous: 0.74 mmol/L (ref 0.5–2.0)

## 2015-06-13 MED ORDER — DEXTROSE 5 % IV SOLN
2.0000 g | Freq: Two times a day (BID) | INTRAVENOUS | Status: DC
  Administered 2015-06-14 – 2015-06-18 (×10): 2 g via INTRAVENOUS
  Filled 2015-06-13 (×13): qty 2

## 2015-06-13 MED ORDER — VANCOMYCIN HCL IN DEXTROSE 1-5 GM/200ML-% IV SOLN
1000.0000 mg | Freq: Once | INTRAVENOUS | Status: AC
  Administered 2015-06-13: 1000 mg via INTRAVENOUS
  Filled 2015-06-13: qty 200

## 2015-06-13 MED ORDER — DEXTROSE 5 % IV SOLN
2.0000 g | Freq: Once | INTRAVENOUS | Status: AC
  Administered 2015-06-13: 2 g via INTRAVENOUS
  Filled 2015-06-13: qty 2

## 2015-06-13 MED ORDER — SODIUM CHLORIDE 0.9 % IV BOLUS (SEPSIS)
1000.0000 mL | INTRAVENOUS | Status: AC
  Administered 2015-06-13 (×2): 1000 mL via INTRAVENOUS

## 2015-06-13 MED ORDER — ACETAMINOPHEN 650 MG RE SUPP
650.0000 mg | Freq: Once | RECTAL | Status: AC
  Administered 2015-06-13: 650 mg via RECTAL
  Filled 2015-06-13: qty 1

## 2015-06-13 NOTE — H&P (Signed)
History and Physical  Patient Name: Emily Kennedy     [id number]    DOB: Aug 04, 1945    DOA: 06/13/2015 Referring physician: Shary Decamp, PA-C PCP: [name], MD      Chief Complaint: Altered mental status  HPI: Emily Kennedy is a 70 y.o. female with a past medical history significant for MGUS, HTN, NIDDM, and leukocytoclastic vasculitis who presents with altered mental status and cough for 4 days.  All history collected from the patient's daughter, present at the bedside because of the patient's mental status. The patient was admitted in January for leg swelling and leg rash and leg weakness, diagnosed with leukocytoclastic vasculitis flare, treated with prednisone, and had improvement in her symptoms. She was readmitted shortly after for hyponatremia, which started to improve and she was discharged home.  Since then the patient's living at home with her daughter, working with PT, and cognitively being at her baseline without dementia. She continues to have leg weakness that greatly limits her ability to walk, this is a combination of weakness and pain.  Now the last 4 days, the patient has been increasingly weak, requiring assistance to get out of a chair. Several days ago she started coughing, and being sweaty and shaking and complaining of being cold, yesterday had emesis 2-3 times, and today was completely "out of it" and would not respond to family, so they brought her to the ED.  In the ED, she was hypoxic requiring NRB, febrile to 100 2.84F, and tachycardic and tachypneic.  Na 129 (improving from previous), K 3.8, Cr 0.7, albumin 2.6, AST 43, TBili 1.3, WBC 4, Hgb 10 (from 8 previously), and lactate normal. X-ray showed a dense right diffuse opacity. Code sepsis was called, blood cultures were drawn, and antibiotics and fluids were administered, and TRH were called for admission.      Review of Systems:  Unable to obtain due to patient mentation.  Allergies  Allergen  Reactions  . Benadryl [Diphenhydramine] Swelling  . Ciprofloxacin Hives  . Furosemide Itching, Swelling, Rash and Other (See Comments)    Made edema worse  . Nitrofurantoin Monohyd Macro Hives  . Septra [Bactrim] Hives  . Sulfa Drugs Cross Reactors Hives  . Ceclor [Cefaclor] Rash    Breaks out in rash chest down.    Prior to Admission medications   Medication Sig Start Date End Date Taking? Authorizing Provider  acetaminophen (TYLENOL) 650 MG CR tablet Take 650 mg by mouth every 8 (eight) hours as needed for pain or fever.   Yes Historical Provider, MD  aspirin EC 81 MG tablet Take 81 mg by mouth daily with supper.    Yes Historical Provider, MD  carvedilol (COREG) 25 MG tablet Take 1 tablet (25 mg total) by mouth 2 (two) times daily with a meal. 04/11/15  Yes Maryann Mikhail, DO  DULoxetine (CYMBALTA) 30 MG capsule Take 30 mg by mouth 2 (two) times daily.  05/16/15  Yes Historical Provider, MD  Glucos-Chondroit-Boron-C-Mn ([employer] GLUCOS-CHONDROIT TRIPLE ST) TABS Take 2 tablets by mouth daily with supper. 05/08/15  Yes Historical Provider, MD  hydrALAZINE (APRESOLINE) 25 MG tablet Take 1 tablet (25 mg total) by mouth every 8 (eight) hours. Patient taking differently: Take 50 mg by mouth every 8 (eight) hours.  04/11/15  Yes Maryann Mikhail, DO  levothyroxine (SYNTHROID, LEVOTHROID) 75 MCG tablet Take 150 mcg by mouth daily before breakfast.   Yes Historical Provider, MD  metFORMIN (GLUCOPHAGE) 1000 MG tablet Take 1,000 mg by mouth 2 (two)  times daily with a meal.    Yes Historical Provider, MD  furosemide (LASIX) 20 MG tablet Take 2 tablets (40 mg total) by mouth daily. 05/20/15   Delfina Redwood, MD  levothyroxine (SYNTHROID, LEVOTHROID) 150 MCG tablet Take 1 tablet (150 mcg total) by mouth daily. Take 2 tablets (150 mcg) by mouth on Sundays, take 1 tablet (75 mcg) Monday thru Saturday 05/20/15   Delfina Redwood, MD  predniSONE (DELTASONE) 20 MG tablet Take 2 tablets (40 mg total) by mouth daily  with breakfast. Until gone 05/20/15   Delfina Redwood, MD    Past Medical History  Diagnosis Date  . Diabetes mellitus   . Vasculitis ([place])   . Hypertension   . Thyroid disease     Past Surgical History  Procedure Laterality Date  . Tubal ligation    . Melanoma excision      Family history: family history includes Cancer in her sister.  Social History: Patient lives with her daughter. Since January she has had to use a walker, and can barely walk from one room to the other. She has no dementia. She is not a smoker.       Physical Exam: BP 179/113 mmHg  Pulse 117  Temp(Src) 102.4 F (39.1 C) (Rectal)  Resp 34  SpO2 98% General appearance: Frail elderly female, lethargic and on BiPAP.   Eyes: Anicteric, conjunctiva watery, lids and lashes normal.     ENT: No nasal deformity, discharge, or epistaxis.  OP tacky dry without lesions.   Skin: Hot and moist.  Fading rash on lower extremities. Cardiac: Tachycardic, regular, nl S1-S2, no murmurs appreciated.  Capillary refill is sluggish.  No LE edema.  Radial pulses 2+ and symmetric. Respiratory: Tachypneic on BiPAP.  Diminished breath sounds with rales on R. Abdomen: Abdomen soft without rigidity.  No TTP. No ascites, distension.   MSK: No deformities or effusions. Neuro: Confused, oriented to "[facility]", not year, otherwise unable to answer quesitons.  Moves all extremities but globally weak and otherwise unable to follow commands consistently.    Psych: Unable to assess.       Labs on Admission:  The metabolic panel shows hyponatremia, metabolic acidosis, normal anion gap, normal renal function, hyperglycemia. Albumin is low. AST and TBili minimally elevated. Blood cultures pending. Lactate normal. The complete blood count shows no leukocytosis or thrombocytopenia, stable normocytic anemia, slightly above previous.   Radiological Exams on Admission: Personally reviewed: Dg Chest Portable 1 View  06/13/2015   CLINICAL DATA:  Acute onset of shortness of breath and generalized weakness. Initial encounter. EXAM: PORTABLE CHEST 1 VIEW COMPARISON:  None. FINDINGS: The lungs are well-aerated. Diffuse right-sided airspace opacification raises concern for pneumonia, though asymmetric pulmonary edema could have a similar appearance. Mild vascular congestion is noted. There is no evidence of pleural effusion or pneumothorax. The cardiomediastinal silhouette is mildly enlarged. No acute osseous abnormalities are seen. IMPRESSION: Diffuse right-sided airspace opacification raises concern for pneumonia, though asymmetric pulmonary edema could have a similar appearance. Mild vascular congestion and mild cardiomegaly noted. Electronically Signed   By: Garald Balding M.D.   On: 06/13/2015 22:25    EKG: Independently reviewed.  sinus rhythm, rate 114, QTC 467, old anterior TWI    Assessment/Plan 1. HCAP:  This is new.  Suspected source lung. Organism unknown. Patient meets criteria given tachycardia, tachypnea, fever, leukocytosis, and evidence of organ dysfunction.  Lactate normal.  This patient is/is not at high risk of poor outcomes with a  SOFA score of 2 (respiratory rate of 34/min and AMS).  Antibiotics delivered in the ED.    -Sepsis bundle utilized:  -Blood and urine cultures drawn  -30 ml/kg bolus given in ED, will repeat lactic acid  -Start targeted antibiotics with vancomycin and cefepime, based on suspected source of infection    -MRSA swab pending, de-escalate if possible  -Procalcitonin ordered  -Repeat renal function and complete blood count in AM  -Code SEPSIS called to E-link -Check ANCA titers  -Check legionella, S pneumo antigens -Obtain sputum gram stain and culture if able -Check influenza panel -BiPAP PRN for now   2. Inability to swallow: Concern for vomiting at home and gagging in ER. -NPO -Swallow evaluation requested  3. Leg weakness: This has been a substantial and new deficit for  the patient since her flare of vasculitis in January.  Per daughter, it is weakness in addition to pain, and is markedly different from her baseline before January. -PT eval when improving  4. Vasculitis:  Off prednisone, has not had follow up with Rheum since new flare in January.  Family don't want long term prednisone.  5. MGUS:  Followed by Dr. Annamaria Boots.  6. Hypothyroidism:  Dose adjustment earlier this year -Check TSH -Continue hoem levothyroxine when able to take PO  7. Hyponatremia:  Presumed to be from #6 above previously.  Stable and improved from before.  8. NIDDM:  -Hold metformin -Sensitive sliding scale corrections  9. Anemia:  This is stable, improved.     DVT PPx: Lovenox Diet: NPO Consultants: Swallow Code Status: FULL Family Communication: Daughter, present at bedside  Medical decision making: What exists of the patient's previous chart was reviewed in depth and the case was discussed with Shary Decamp, PA-C. Patient seen 11:30 PM on 06/13/2015.  Disposition Plan:  I recommend admission to stepdown, inpatient status.  Clinical condition: gaurded, on BiPAP, concern for respiratory deterioration and need for intubation.  Anticipate IV fluids and antibiotics, follow culture data.  Anticipate prolonged hospitlization.      Edwin Dada [facility] Pager [phone]

## 2015-06-13 NOTE — Progress Notes (Signed)
Pharmacy Antibiotic Note  Emily Kennedy is a 70 y.o. female admitted on 06/13/2015 with pneumonia.  Pharmacy has been consulted for cefepime and vancomycin dosing.  Pt received cefepime 2g and vancomycin 1g IV once in the ED.  Plan: Vancomycin 750mg  IV every 12 hours.  Goal trough 15-20 mcg/mL.  Cefepime 2g IV q12h  Monitor culture data, renal function and clinical course VT at SS prn      Temp (24hrs), Avg:101.2 F (38.4 C), Min:99.9 F (37.7 C), Max:102.4 F (39.1 C)   Recent Labs Lab 06/13/15 2052 06/13/15 2126  WBC 4.0  --   CREATININE 0.73  --   LATICACIDVEN  --  0.74    CrCl cannot be calculated (Unknown ideal weight.).    Allergies  Allergen Reactions  . Furosemide Itching, Swelling, Rash and Other (See Comments)    Made edema worse  . Benadryl [Diphenhydramine] Swelling  . Ciprofloxacin Hives  . Nitrofurantoin Monohyd Macro Hives  . Septra [Bactrim] Hives  . Sulfa Drugs Cross Reactors Hives  . Ceclor [Cefaclor] Rash    Breaks out in rash chest down.    Antimicrobials this admission: Cefepime 3/31 >>  Vanc 3/31 >>   Dose adjustments this admission: n/a  Microbiology results:  BCx:   UCx:    Sputum:    MRSA PCR:    Andrey Cota. Diona Foley, PharmD, [place] Clinical Pharmacist Pager [phone] 06/13/2015 9:17 PM

## 2015-06-13 NOTE — ED Provider Notes (Signed)
CSN: [id number]     Arrival date & time 06/13/15  2004 History   First MD Initiated Contact with Patient 06/13/15 2031     Chief Complaint  Patient presents with  . Shortness of Breath  . Altered Mental Status   (Consider location/radiation/quality/duration/timing/severity/associated sxs/prior Treatment) HPI HPI Provided by Daughter 70 y.o. female with a hx of DM, CHF presents to the Emergency Department today via EMS due to AMS and shortness of breath. EMS found her hypoxic at 88% RA given NRB with sats back to 98%. According to family, decrease in activity, PO intake, N/V/D and short of breath with increased work of breathing for the past 2 days. Recently in the hospital due to swelling of her legs. Daughter notes that the patient has been declining ever since January. Noted that she used to ambulate and talk prior to January. Now uses walker to ambulate and is almost nonverbal. Pt does not use Home O2.   Level V Caveat: AMS  Past Medical History  Diagnosis Date  . Diabetes mellitus   . Vasculitis ([place])   . Hypertension   . Thyroid disease    Past Surgical History  Procedure Laterality Date  . Tubal ligation    . Melanoma excision     Family History  Problem Relation Age of Onset  . Cancer Sister    Social History  Substance Use Topics  . Smoking status: Never Smoker   . Smokeless tobacco: Never Used  . Alcohol Use: No   OB History    No data available     Review of Systems  Unable to perform ROS: Mental status change   Allergies  Benadryl; Ciprofloxacin; Nitrofurantoin monohyd macro; Septra; Sulfa drugs cross reactors; and Ceclor  Home Medications   Prior to Admission medications   Medication Sig Start Date End Date Taking? Authorizing Provider  acetaminophen (TYLENOL) 500 MG tablet Take 1,000 mg by mouth every 6 (six) hours as needed (pain).    Historical Provider, MD  aspirin EC 81 MG tablet Take 81 mg by mouth daily with supper.     Historical Provider, MD   carvedilol (COREG) 25 MG tablet Take 1 tablet (25 mg total) by mouth 2 (two) times daily with a meal. 04/11/15   Maryann Mikhail, DO  DULoxetine (CYMBALTA) 30 MG capsule Take 30 mg by mouth 2 (two) times daily. 05/16/15   Historical Provider, MD  fish oil-omega-3 fatty acids 1000 MG capsule Take 1 g by mouth daily.      Historical Provider, MD  furosemide (LASIX) 20 MG tablet Take 2 tablets (40 mg total) by mouth daily. 05/20/15   Delfina Redwood, MD  Glucos-Chondroit-Boron-C-Mn ([employer] GLUCOS-CHONDROIT TRIPLE ST) TABS Take 2 tablets by mouth daily with supper. 05/08/15   Historical Provider, MD  hydrALAZINE (APRESOLINE) 25 MG tablet Take 1 tablet (25 mg total) by mouth every 8 (eight) hours. 04/11/15   Maryann Mikhail, DO  l-methylfolate-B6-B12 (METANX) 3-35-2 MG TABS tablet Take 1 tablet by mouth 2 (two) times daily.    Historical Provider, MD  levothyroxine (SYNTHROID, LEVOTHROID) 150 MCG tablet Take 1 tablet (150 mcg total) by mouth daily. Take 2 tablets (150 mcg) by mouth on Sundays, take 1 tablet (75 mcg) Monday thru Saturday 05/20/15   Delfina Redwood, MD  metFORMIN (GLUCOPHAGE) 1000 MG tablet Take 1,000 mg by mouth 2 (two) times daily with a meal.     Historical Provider, MD  predniSONE (DELTASONE) 20 MG tablet Take 2 tablets (40 mg total)  by mouth daily with breakfast. Until gone 05/20/15   Delfina Redwood, MD   BP 192/110 mmHg  Pulse 116  Temp(Src) 102.4 F (39.1 C) (Rectal)  Resp 20  SpO2 98%   Physical Exam  Constitutional: She is oriented to person, place, and time. She appears well-developed and well-nourished.  HENT:  Head: Normocephalic and atraumatic.  Eyes: EOM are normal. Pupils are equal, round, and reactive to light.  Neck: Normal range of motion. Neck supple. No tracheal deviation present.  Cardiovascular: Regular rhythm, normal heart sounds, intact distal pulses and normal pulses.  Tachycardia present.   No murmur heard. Pulmonary/Chest: Accessory muscle usage present.  Tachypnea noted. No respiratory distress. She has no wheezes. She has rhonchi in the right lower field and the left lower field. She has no rales. She exhibits no tenderness.  NRB 8L O2  Abdominal: Soft. There is no tenderness.  Musculoskeletal: Normal range of motion.  No BLE edema noted   Neurological: She is alert and oriented to person, place, and time. She has normal strength.  Pt able to follow commands. Minimal communication.   Skin: Skin is warm and dry.  Psychiatric: She has a normal mood and affect. Her behavior is normal. Thought content normal.  Nursing note and vitals reviewed.  ED Course  Procedures (including critical care time) CRITICAL CARE Performed by: Ozella Rocks  Total critical care time: 35 minutes  Critical care time was exclusive of separately billable procedures and treating other patients.  Critical care was necessary to treat or prevent imminent or life-threatening deterioration.  Critical care was time spent personally by me on the following activities: development of treatment plan with patient and/or surrogate as well as nursing, discussions with consultants, evaluation of patient's response to treatment, examination of patient, obtaining history from patient or surrogate, ordering and performing treatments and interventions, ordering and review of laboratory studies, ordering and review of radiographic studies, pulse oximetry and re-evaluation of patient's condition.  Labs Review Labs Reviewed  COMPREHENSIVE METABOLIC PANEL - Abnormal; Notable for the following:    Sodium 129 (*)    Chloride 98 (*)    CO2 19 (*)    Glucose, Bld 167 (*)    Total Protein 6.2 (*)    Albumin 2.6 (*)    AST 43 (*)    Total Bilirubin 1.3 (*)    All other components within normal limits  CBC WITH DIFFERENTIAL/PLATELET - Abnormal; Notable for the following:    RBC 3.21 (*)    Hemoglobin 10.0 (*)    HCT 28.7 (*)    RDW 17.4 (*)    All other components within normal  limits  I-STAT VENOUS BLOOD GAS, ED - Abnormal; Notable for the following:    pH, Ven 7.430 (*)    pCO2, Ven 33.1 (*)    pO2, Ven 61.0 (*)    All other components within normal limits  CULTURE, BLOOD (ROUTINE X 2)  CULTURE, BLOOD (ROUTINE X 2)  URINE CULTURE  URINALYSIS, ROUTINE W REFLEX MICROSCOPIC (NOT AT [facility])  BLOOD GAS, VENOUS  I-STAT CG4 LACTIC ACID, ED   Imaging Review Dg Chest Portable 1 View  06/13/2015  CLINICAL DATA:  Acute onset of shortness of breath and generalized weakness. Initial encounter. EXAM: PORTABLE CHEST 1 VIEW COMPARISON:  None. FINDINGS: The lungs are well-aerated. Diffuse right-sided airspace opacification raises concern for pneumonia, though asymmetric pulmonary edema could have a similar appearance. Mild vascular congestion is noted. There is no evidence of pleural  effusion or pneumothorax. The cardiomediastinal silhouette is mildly enlarged. No acute osseous abnormalities are seen. IMPRESSION: Diffuse right-sided airspace opacification raises concern for pneumonia, though asymmetric pulmonary edema could have a similar appearance. Mild vascular congestion and mild cardiomegaly noted. Electronically Signed   By: Garald Balding M.D.   On: 06/13/2015 22:25   I have personally reviewed and evaluated these images and lab results as part of my medical decision-making.   EKG Interpretation   Date/Time:  Friday June 13 2015 20:31:10 EDT Ventricular Rate:  114 PR Interval:  160 QRS Duration: 93 QT Interval:  339 QTC Calculation: 467 R Axis:   50 Text Interpretation:  Sinus tachycardia Abnrm T, consider ischemia,  anterolateral lds Since last tracing T wave abnormality now present in  anterior leads Confirmed by MILLER  MD, BRIAN ([id number]) on 06/13/2015 9:03:14  PM      MDM  I have reviewed and evaluated the relevant laboratory values.I have reviewed and evaluated the relevant imaging studies.I personally evaluated and interpreted the relevant EKG.I have  reviewed the relevant previous healthcare records.I have reviewed EMS Documentation.I obtained HPI from historian. Patient discussed with supervising physician  ED Course:  Assessment: Pt is a 7yF with hx DM, CHF who presents via EMS due to AMS/Shortness of breath. Per EMS, patient found hypoxic at 88% RA. Given NRB with Sats 98%. Family notes decrease level of activity, AMS, increase work of breathing x 2 days. On exam, Septic appearing. VS show tachycardia, hypertension, tachypnea. O2 Sats stable at 98% on NRB. Febrile 102. Lungs show bilateral rhonchi. Abdomen nontender soft. Labs iStat Lactate 0.74. Code Sepsis initiated due to Tachycardia. Temp 102. Source most likely Pulmonary so started on HCAP ABX (cefepime/vanc) due to recent hospitalization. Given Fluids as per Code Sepsis. Set for 2L NS. CXR showed right PNA. Plan is to Admit to step down.   Disposition/Plan:  Admit to step down   Supervising Physician Noemi Chapel, MD   Final diagnoses:  Healthcare-associated pneumonia      Shary Decamp, PA-C 06/13/15 2242  Noemi Chapel, MD 06/14/15 1229

## 2015-06-13 NOTE — ED Provider Notes (Signed)
70 year old critically ill appearing female who lives with family members, they state that today when he went to check on her she had a decreased ability to speak to them, she has had 2 days of not wanting to eat anything and is at multiple episodes of vomiting and diarrhea as well as increased work of breathing. When they found her tonight she was having rapid breathing, she was hypoxic, she was diffusely diaphoretic and felt hot to the touch. Paramedics found her hypoxic at 88% on room air, they put her on a nonrebreather. On my exam the patient is able to follow commands but does not answer to many questions, her fever is palpable, she is tachycardic to 120, she is tachypneic, she has abnormal rhonchorous lung sounds bilaterally and is using mild accessory muscles. She has no peripheral edema, her vital signs are unstable in that she is tachycardic and febrile, she is septic from a source which is likely pulmonary, workup to ensue including fluids, lactic acid, antibiotics, admit to a high level of care.  Bipap required - pt had some decompensation but looked better after Bipap. Admitted to hospitalist.  CRITICAL CARE Performed by: Johnna Acosta Total critical care time: 35 minutes Critical care time was exclusive of separately billable procedures and treating other patients. Critical care was necessary to treat or prevent imminent or life-threatening deterioration. Critical care was time spent personally by me on the following activities: development of treatment plan with patient and/or surrogate as well as nursing, discussions with consultants, evaluation of patient's response to treatment, examination of patient, obtaining history from patient or surrogate, ordering and performing treatments and interventions, ordering and review of laboratory studies, ordering and review of radiographic studies, pulse oximetry and re-evaluation of patient's condition.   EKG Interpretation  Date/Time:  Friday  June 13 2015 20:31:10 EDT Ventricular Rate:  114 PR Interval:  160 QRS Duration: 93 QT Interval:  339 QTC Calculation: 467 R Axis:   50 Text Interpretation:  Sinus tachycardia Abnrm T, consider ischemia, anterolateral lds Since last tracing T wave abnormality now present in anterior leads Confirmed by Esmirna Ravan  MD, Neyra Pettie ([id number]) on 06/13/2015 9:03:14 PM      Medical screening examination/treatment/procedure(s) were conducted as a shared visit with non-physician practitioner(s) and myself.  I personally evaluated the patient during the encounter.  Clinical Impression:   Final diagnoses:  Healthcare-associated pneumonia  Sepsis      Noemi Chapel, MD 06/14/15 1230

## 2015-06-13 NOTE — ED Notes (Signed)
Received call that "pt was choking".  Upon arrival RN found another Therapist, sports and Res spec. At bedside.  Pt had something in her mouth however she had not been given anything PO since her arrival.  It appeared to be a partially dissolved pill.  Family member stated that pt had been a blue pill (symbolta 60mg ) earlier today.  RN failed pt on swallow screen and notified provider

## 2015-06-13 NOTE — ED Notes (Signed)
Per EMS pt's family stated that pt had declined reciently, not being able to get out of bed, becoming non-verbal, not eating anything (X2 days) and today when they got home she was very short of breath.  Upon arrival, EMS found her at 88% on room air, placed her on a non-rebreather which increased her O2 to 99%.  Hx of diabeties, and HNT.  EKG was sinus tac on arrival.

## 2015-06-14 ENCOUNTER — Encounter (HOSPITAL_COMMUNITY): Payer: Self-pay

## 2015-06-14 DIAGNOSIS — E038 Other specified hypothyroidism: Secondary | ICD-10-CM

## 2015-06-14 DIAGNOSIS — J154 Pneumonia due to other streptococci: Secondary | ICD-10-CM | POA: Diagnosis present

## 2015-06-14 DIAGNOSIS — G9340 Encephalopathy, unspecified: Secondary | ICD-10-CM | POA: Diagnosis present

## 2015-06-14 LAB — URINALYSIS, ROUTINE W REFLEX MICROSCOPIC
BILIRUBIN URINE: NEGATIVE
Glucose, UA: 250 mg/dL — AB
KETONES UR: 15 mg/dL — AB
Leukocytes, UA: NEGATIVE
NITRITE: NEGATIVE
Specific Gravity, Urine: >1.03 — ABNORMAL HIGH (ref 1.005–1.030)
pH: 6 (ref 5.0–8.0)

## 2015-06-14 LAB — COMPREHENSIVE METABOLIC PANEL
ALK PHOS: 37 U/L — AB (ref 38–126)
ALT: 20 U/L (ref 14–54)
AST: 50 U/L — ABNORMAL HIGH (ref 15–41)
Albumin: 2.3 g/dL — ABNORMAL LOW (ref 3.5–5.0)
Anion gap: 10 (ref 5–15)
BILIRUBIN TOTAL: 1 mg/dL (ref 0.3–1.2)
BUN: 19 mg/dL (ref 6–20)
CALCIUM: 8.3 mg/dL — AB (ref 8.9–10.3)
CHLORIDE: 101 mmol/L (ref 101–111)
CO2: 18 mmol/L — ABNORMAL LOW (ref 22–32)
CREATININE: 0.77 mg/dL (ref 0.44–1.00)
Glucose, Bld: 177 mg/dL — ABNORMAL HIGH (ref 65–99)
Potassium: 3.6 mmol/L (ref 3.5–5.1)
SODIUM: 129 mmol/L — AB (ref 135–145)
TOTAL PROTEIN: 5.9 g/dL — AB (ref 6.5–8.1)

## 2015-06-14 LAB — CBC
HCT: 29.9 % — ABNORMAL LOW (ref 36.0–46.0)
Hemoglobin: 10.2 g/dL — ABNORMAL LOW (ref 12.0–15.0)
MCH: 29.1 pg (ref 26.0–34.0)
MCHC: 34.1 g/dL (ref 30.0–36.0)
MCV: 85.4 fL (ref 78.0–100.0)
PLATELETS: 145 10*3/uL — AB (ref 150–400)
RBC: 3.5 MIL/uL — AB (ref 3.87–5.11)
RDW: 16.4 % — ABNORMAL HIGH (ref 11.5–15.5)
WBC: 4.5 10*3/uL (ref 4.0–10.5)

## 2015-06-14 LAB — URINE MICROSCOPIC-ADD ON

## 2015-06-14 LAB — I-STAT ARTERIAL BLOOD GAS, ED
Acid-base deficit: 7 mmol/L — ABNORMAL HIGH (ref 0.0–2.0)
Bicarbonate: 17.9 mEq/L — ABNORMAL LOW (ref 20.0–24.0)
O2 Saturation: 96 %
PCO2 ART: 34.9 mmHg — AB (ref 35.0–45.0)
PH ART: 7.318 — AB (ref 7.350–7.450)
TCO2: 19 mmol/L (ref 0–100)
pO2, Arterial: 86 mmHg (ref 80.0–100.0)

## 2015-06-14 LAB — BLOOD GAS, VENOUS

## 2015-06-14 LAB — CREATININE, SERUM
Creatinine, Ser: 0.73 mg/dL (ref 0.44–1.00)
GFR calc non Af Amer: >60 mL/min (ref >60)

## 2015-06-14 LAB — TSH: TSH: 4.319 u[IU]/mL (ref 0.350–4.500)

## 2015-06-14 LAB — INFLUENZA PANEL BY PCR (TYPE A & B)
H1N1 flu by pcr: NOT DETECTED
INFLAPCR: NEGATIVE
Influenza B By PCR: NEGATIVE

## 2015-06-14 LAB — GLUCOSE, CAPILLARY
GLUCOSE-CAPILLARY: 125 mg/dL — AB (ref 65–99)
Glucose-Capillary: 185 mg/dL — ABNORMAL HIGH (ref 65–99)

## 2015-06-14 LAB — CBG MONITORING, ED
GLUCOSE-CAPILLARY: 172 mg/dL — AB (ref 65–99)
Glucose-Capillary: 188 mg/dL — ABNORMAL HIGH (ref 65–99)

## 2015-06-14 LAB — MRSA PCR SCREENING: MRSA by PCR: NEGATIVE

## 2015-06-14 LAB — I-STAT CG4 LACTIC ACID, ED: Lactic Acid, Venous: 0.71 mmol/L (ref 0.5–2.0)

## 2015-06-14 LAB — STREP PNEUMONIAE URINARY ANTIGEN: STREP PNEUMO URINARY ANTIGEN: POSITIVE — AB

## 2015-06-14 LAB — PROCALCITONIN: Procalcitonin: 0.12 ng/mL

## 2015-06-14 LAB — HIV ANTIBODY (ROUTINE TESTING W REFLEX): HIV SCREEN 4TH GENERATION: NONREACTIVE

## 2015-06-14 MED ORDER — ACETAMINOPHEN 650 MG RE SUPP
650.0000 mg | Freq: Once | RECTAL | Status: AC
  Administered 2015-06-14: 650 mg via RECTAL
  Filled 2015-06-14: qty 1

## 2015-06-14 MED ORDER — ONDANSETRON HCL 4 MG PO TABS: 4.0000 mg | ORAL_TABLET | Freq: Four times a day (QID) | ORAL | Status: DC | PRN

## 2015-06-14 MED ORDER — IPRATROPIUM BROMIDE 0.02 % IN SOLN: 0.5000 mg | RESPIRATORY_TRACT | Status: DC | PRN

## 2015-06-14 MED ORDER — ENOXAPARIN SODIUM 40 MG/0.4ML ~~LOC~~ SOLN
40.0000 mg | SUBCUTANEOUS | Status: DC
  Administered 2015-06-15 – 2015-06-23 (×9): 40 mg via SUBCUTANEOUS
  Filled 2015-06-14 (×10): qty 0.4

## 2015-06-14 MED ORDER — METOPROLOL TARTRATE 1 MG/ML IV SOLN
2.5000 mg | Freq: Once | INTRAVENOUS | Status: AC
  Administered 2015-06-14: 2.5 mg via INTRAVENOUS
  Filled 2015-06-14: qty 5

## 2015-06-14 MED ORDER — METOPROLOL TARTRATE 1 MG/ML IV SOLN: 5.0000 mg | Freq: Once | INTRAVENOUS | Status: DC

## 2015-06-14 MED ORDER — ACETAMINOPHEN 650 MG RE SUPP
650.0000 mg | RECTAL | Status: DC | PRN
  Administered 2015-06-14: 650 mg via RECTAL
  Filled 2015-06-14: qty 1

## 2015-06-14 MED ORDER — ALBUTEROL SULFATE (2.5 MG/3ML) 0.083% IN NEBU: 2.5000 mg | INHALATION_SOLUTION | RESPIRATORY_TRACT | Status: DC | PRN

## 2015-06-14 MED ORDER — CETYLPYRIDINIUM CHLORIDE 0.05 % MT LIQD
7.0000 mL | Freq: Two times a day (BID) | OROMUCOSAL | Status: DC
  Administered 2015-06-14 – 2015-06-23 (×16): 7 mL via OROMUCOSAL

## 2015-06-14 MED ORDER — VANCOMYCIN HCL IN DEXTROSE 750-5 MG/150ML-% IV SOLN
750.0000 mg | Freq: Two times a day (BID) | INTRAVENOUS | Status: DC
  Administered 2015-06-14 – 2015-06-15 (×3): 750 mg via INTRAVENOUS
  Filled 2015-06-14 (×6): qty 150

## 2015-06-14 MED ORDER — ONDANSETRON HCL 4 MG/2ML IJ SOLN: 4.0000 mg | Freq: Four times a day (QID) | INTRAMUSCULAR | Status: DC | PRN

## 2015-06-14 MED ORDER — INSULIN ASPART 100 UNIT/ML ~~LOC~~ SOLN
0.0000 [IU] | Freq: Three times a day (TID) | SUBCUTANEOUS | Status: DC
  Administered 2015-06-14 – 2015-06-15 (×5): 2 [IU] via SUBCUTANEOUS
  Administered 2015-06-16 – 2015-06-19 (×9): 3 [IU] via SUBCUTANEOUS
  Filled 2015-06-14 (×2): qty 1

## 2015-06-14 MED ORDER — ACETAMINOPHEN 325 MG PO TABS: 650.0000 mg | ORAL_TABLET | Freq: Three times a day (TID) | ORAL | Status: DC | PRN

## 2015-06-14 MED ORDER — INSULIN ASPART 100 UNIT/ML ~~LOC~~ SOLN
0.0000 [IU] | Freq: Every day | SUBCUTANEOUS | Status: DC
  Administered 2015-06-16: 2 [IU] via SUBCUTANEOUS
  Administered 2015-06-17: 3 [IU] via SUBCUTANEOUS
  Administered 2015-06-19: 2 [IU] via SUBCUTANEOUS

## 2015-06-14 MED ORDER — IPRATROPIUM BROMIDE 0.02 % IN SOLN
0.5000 mg | RESPIRATORY_TRACT | Status: DC
  Administered 2015-06-14 (×2): 0.5 mg via RESPIRATORY_TRACT
  Filled 2015-06-14 (×2): qty 2.5

## 2015-06-14 MED ORDER — SODIUM CHLORIDE 0.9 % IV SOLN
INTRAVENOUS | Status: DC
  Administered 2015-06-14 – 2015-06-21 (×5): via INTRAVENOUS

## 2015-06-14 MED ORDER — IPRATROPIUM-ALBUTEROL 0.5-2.5 (3) MG/3ML IN SOLN
3.0000 mL | RESPIRATORY_TRACT | Status: DC
  Administered 2015-06-14 (×3): 3 mL via RESPIRATORY_TRACT
  Filled 2015-06-14 (×3): qty 3

## 2015-06-14 MED ORDER — CHLORHEXIDINE GLUCONATE 0.12 % MT SOLN
15.0000 mL | Freq: Two times a day (BID) | OROMUCOSAL | Status: DC
  Administered 2015-06-14 – 2015-06-23 (×18): 15 mL via OROMUCOSAL
  Filled 2015-06-14 (×16): qty 15

## 2015-06-14 MED ORDER — IPRATROPIUM-ALBUTEROL 0.5-2.5 (3) MG/3ML IN SOLN
3.0000 mL | Freq: Three times a day (TID) | RESPIRATORY_TRACT | Status: DC
  Administered 2015-06-15: 3 mL via RESPIRATORY_TRACT
  Filled 2015-06-14: qty 3

## 2015-06-14 MED ORDER — SODIUM CHLORIDE 0.9% FLUSH
3.0000 mL | Freq: Two times a day (BID) | INTRAVENOUS | Status: DC
  Administered 2015-06-14 – 2015-06-23 (×16): 3 mL via INTRAVENOUS

## 2015-06-14 MED ORDER — HYDRALAZINE HCL 20 MG/ML IJ SOLN
5.0000 mg | Freq: Four times a day (QID) | INTRAMUSCULAR | Status: DC | PRN
  Administered 2015-06-14 – 2015-06-22 (×2): 5 mg via INTRAVENOUS
  Filled 2015-06-14 (×2): qty 1

## 2015-06-14 NOTE — ED Notes (Signed)
Contacted Resp. Due to pt O2 level being 88%

## 2015-06-14 NOTE — ED Notes (Signed)
Family requesting repositioning of BiPAP mask, RT at bedside attempting to wean pt to [facility] for pt comfort

## 2015-06-14 NOTE — Progress Notes (Signed)
RT took pt off bi-pap and put on venti-mask. RT will continue to monitor.

## 2015-06-14 NOTE — ED Notes (Signed)
Admitting MD repaged 

## 2015-06-14 NOTE — ED Notes (Signed)
Attempted report 

## 2015-06-14 NOTE — Progress Notes (Addendum)
Patient ID: Emily Kennedy, female   DOB: 31-Dec-1945, 70 y.o.   MRN: [id number] [facility] PROGRESS NOTE  Aubery Date VOP:[id number] DOB: 12/17/1945 DOA: 06/13/2015 PCP: [name], MD  Brief narrative:    70 y.o. female with a past medical history significant for MGUS, HTN, NIDDM, and leukocytoclastic vasculitis who presented with altered mental status and cough, weakness and poor appetite for past 4 days PTA.  In the ED, pt was hypoxic requiring NRB, febrile to 100 2.60F, and tachycardic and tachypneic. Blood work showed sodium of 129, lactate normal. CXR showed a dense right diffuse opacity. Pt started on vanco and cefepime for HCAP treatment.    Assessment/Plan:    Principal Problem:   Acute respiratory failure with hypoxia (HCC) / Sepsis secondary to HCAP (healthcare-associated pneumonia) secondary to streptococcus pneumonia  - Sepsis criteria met on admission with fever, tachycardia, tachypnea, hypoxia with evidence of pneumonia on CXR - Diffuse right sided airspace opacifications concerning for pneumonia seen on admission CXR - Streptococcus pneumonia antigen positive  - Started on broad spectrum abx, vanco and cefepime - Follow up influenza panel - Follow up blood culture results - Follow up legionella results  Active Problems:   Acute encephalopathy - Due to acute infection, sepsis - Monitor mental status - Better this am per pt daughter at the bedside    Hyponatremia - Likely dehydration - Continue to monitor BMP daily - Continue current IV fluids but reduce rate to 50 cc/hr    Diabetes mellitus type 2, uncontrolled (HCC) - Continue SSI for now - She is NPO due to requirement for BiPAP    Anemia, chronic disease - Due to history of MGUS - Hemoglobin stable    Hypothyroidism - Continue synthroid    Essential hypertension - Coreg on hold due to acute respiratory failure - Added hydralazine PRN for BP control   DVT Prophylaxis  - Lovenox subQ  ordered   Code Status: Full.  Family Communication:  plan of care discussed with the patient Disposition Plan: to SDU since she requires BiPAP  IV access:  Peripheral IV  Procedures and diagnostic studies:    Dg Chest 2 View 05/17/2015  Probable mild congestive changes.  No focal consolidation.   Dg Chest Portable 1 View 06/13/2015   Diffuse right-sided airspace opacification raises concern for pneumonia, though asymmetric pulmonary edema could have a similar appearance. Mild vascular congestion and mild cardiomegaly noted.   Medical Consultants:  None   IAnti-Infectives:   Vanco and cefepime 06/13/2015 -->   Leisa Lenz, MD  [facility] Pager [phone]  Time spent in minutes: 25 minutes  If 7PM-7AM, please contact night-coverage [email or web address] Password [facility] 06/14/2015, 6:23 PM   LOS: 1 day    HPI/Subjective: No acute overnight events. Patient reports feeling tired.  Objective: Filed Vitals:   06/14/15 1400 06/14/15 1430 06/14/15 1602 06/14/15 1627  BP: 157/86 154/88  159/95  Pulse: 112 111  109  Temp:  98.5 F (36.9 C)  98.4 F (36.9 C)  TempSrc:  Oral  Oral  Resp: 22 24  22   Height:  5' 4"  (1.626 m)    Weight:  64.3 kg (141 lb 12.1 oz)    SpO2: 96% 93% 93% 94%    Intake/Output Summary (Last 24 hours) at 06/14/15 1823 Last data filed at 06/14/15 1400  Gross per 24 hour  Intake 1391.33 ml  Output      0 ml  Net 1391.33 ml    Exam:   General:  Pt is alert, on BiPAP  Cardiovascular: Regular rate and rhythm, S1/S2 appreciated   Respiratory: Diminished bilaterally, no wheezing  Abdomen: Soft, non tender, non distended, bowel sounds present  Extremities: No edema, pulses palpable bilaterally  Neuro: Grossly nonfocal  Data Reviewed: Basic Metabolic Panel:  Recent Labs Lab 06/13/15 2052 06/14/15 0353  NA 129* 129*  K 3.8 3.6  CL 98* 101  CO2 19* 18*  GLUCOSE 167* 177*  BUN 17 19  CREATININE 0.73 0.77  0.73  CALCIUM 8.9 8.3*   Liver  Function Tests:  Recent Labs Lab 06/13/15 2052 06/14/15 0353  AST 43* 50*  ALT 21 20  ALKPHOS 42 37*  BILITOT 1.3* 1.0  PROT 6.2* 5.9*  ALBUMIN 2.6* 2.3*   No results for input(s): LIPASE, AMYLASE in the last 168 hours. No results for input(s): AMMONIA in the last 168 hours. CBC:  Recent Labs Lab 06/13/15 2052 06/14/15 0353  WBC 4.0 4.5  NEUTROABS 2.5  --   HGB 10.0* 10.2*  HCT 28.7* 29.9*  MCV 89.4 85.4  PLT 178 145*   Cardiac Enzymes: No results for input(s): CKTOTAL, CKMB, CKMBINDEX, TROPONINI in the last 168 hours. BNP: Invalid input(s): POCBNP CBG:  Recent Labs Lab 06/14/15 0901 06/14/15 1224 06/14/15 1623  GLUCAP 172* 188* 125*    Recent Results (from the past 240 hour(s))  Blood Culture (routine x 2)     Status: None (Preliminary result)   Collection Time: 06/13/15  8:52 PM  Result Value Ref Range Status   Specimen Description BLOOD LEFT HAND  Final   Special Requests BOTTLES DRAWN AEROBIC AND ANAEROBIC 5CC  Final   Culture NO GROWTH < 24 HOURS  Final   Report Status PENDING  Incomplete  Blood Culture (routine x 2)     Status: None (Preliminary result)   Collection Time: 06/13/15  9:04 PM  Result Value Ref Range Status   Specimen Description BLOOD RIGHT WRIST  Final   Special Requests BOTTLES DRAWN AEROBIC AND ANAEROBIC 5CC  Final   Culture NO GROWTH < 24 HOURS  Final   Report Status PENDING  Incomplete  MRSA PCR Screening     Status: None   Collection Time: 06/14/15  2:15 PM  Result Value Ref Range Status   MRSA by PCR NEGATIVE NEGATIVE Final    Comment:        The GeneXpert MRSA Assay (FDA approved for NASAL specimens only), is one component of a comprehensive MRSA colonization surveillance program. It is not intended to diagnose MRSA infection nor to guide or monitor treatment for MRSA infections.      Scheduled Meds: . antiseptic oral rinse  7 mL Mouth Rinse q12n4p  . ceFEPime (MAXIPIME) IV  2 g Intravenous Q12H  . chlorhexidine   15 mL Mouth Rinse BID  . enoxaparin (LOVENOX) injection  40 mg Subcutaneous Q24H  . insulin aspart  0-5 Units Subcutaneous QHS  . insulin aspart  0-9 Units Subcutaneous TID WC  . ipratropium  0.5 mg Nebulization Q4H  . ipratropium-albuterol  3 mL Nebulization Q4H  . sodium chloride flush  3 mL Intravenous Q12H  . vancomycin  750 mg Intravenous Q12H   Continuous Infusions: . sodium chloride 50 mL/hr (06/14/15 1124)

## 2015-06-14 NOTE — ED Notes (Signed)
Admitting MD paged re: plan of care & pts fever not relieved with Tylenol suppository

## 2015-06-14 NOTE — ED Notes (Signed)
Danford, MD paged d/t elevated temp & request for suppository tylenol

## 2015-06-14 NOTE — ED Notes (Signed)
RT contacted re: decreased O2 sats are in the upper 80s, at bedside reassessing pt

## 2015-06-14 NOTE — ED Notes (Addendum)
Spoke to provider Danford concerning continued tachycardia, he instructed RN to continue w/ current orders, will have resp. Spec. Place pt on venturi mask.

## 2015-06-14 NOTE — ED Notes (Signed)
Lab spoke with this RN & reports that they received the urine specimen and it had leaked out of the collection container & was in the biohazard bag & needs to be recollected, this RN sent the sample with the [facility], NT who verified the in & out procedure & collection including the securement of the top of the collection container, [employer] RN aware, will attempt to recollect via I&O

## 2015-06-14 NOTE — Progress Notes (Signed)
Weaned pt to a nasal cannula 5 Lpm sat 98%. RN notified.

## 2015-06-15 LAB — URINE CULTURE: Culture: NO GROWTH

## 2015-06-15 LAB — GLUCOSE, CAPILLARY
GLUCOSE-CAPILLARY: 156 mg/dL — AB (ref 65–99)
GLUCOSE-CAPILLARY: 175 mg/dL — AB (ref 65–99)
GLUCOSE-CAPILLARY: 171 mg/dL — AB (ref 65–99)
Glucose-Capillary: 182 mg/dL — ABNORMAL HIGH (ref 65–99)

## 2015-06-15 LAB — VANCOMYCIN, TROUGH: VANCOMYCIN TR: 13 ug/mL (ref 10.0–20.0)

## 2015-06-15 LAB — PROCALCITONIN: PROCALCITONIN: 0.19 ng/mL

## 2015-06-15 MED ORDER — ALBUTEROL SULFATE (2.5 MG/3ML) 0.083% IN NEBU: 2.5000 mg | INHALATION_SOLUTION | RESPIRATORY_TRACT | Status: DC | PRN

## 2015-06-15 MED ORDER — ALBUTEROL SULFATE (2.5 MG/3ML) 0.083% IN NEBU: 2.5000 mg | INHALATION_SOLUTION | Freq: Four times a day (QID) | RESPIRATORY_TRACT | Status: DC | PRN

## 2015-06-15 MED ORDER — HYDROCORTISONE NA SUCCINATE PF 100 MG IJ SOLR
50.0000 mg | Freq: Two times a day (BID) | INTRAMUSCULAR | Status: DC
  Administered 2015-06-15 – 2015-06-20 (×12): 50 mg via INTRAVENOUS
  Filled 2015-06-15 (×12): qty 2

## 2015-06-15 MED ORDER — LEVOTHYROXINE SODIUM 100 MCG IV SOLR
75.0000 ug | Freq: Every day | INTRAVENOUS | Status: DC
  Administered 2015-06-15 – 2015-06-23 (×9): 75 ug via INTRAVENOUS
  Filled 2015-06-15 (×9): qty 5

## 2015-06-15 MED ORDER — METOPROLOL TARTRATE 1 MG/ML IV SOLN
INTRAVENOUS | Status: AC
  Administered 2015-06-15: 5 mg
  Filled 2015-06-15: qty 5

## 2015-06-15 MED ORDER — VANCOMYCIN HCL IN DEXTROSE 1-5 GM/200ML-% IV SOLN
1000.0000 mg | Freq: Two times a day (BID) | INTRAVENOUS | Status: DC
  Administered 2015-06-15 – 2015-06-17 (×5): 1000 mg via INTRAVENOUS
  Filled 2015-06-15 (×6): qty 200

## 2015-06-15 MED ORDER — METOPROLOL TARTRATE 1 MG/ML IV SOLN
5.0000 mg | Freq: Once | INTRAVENOUS | Status: AC
  Administered 2015-06-15: 5 mg via INTRAVENOUS

## 2015-06-15 MED ORDER — BUDESONIDE 0.25 MG/2ML IN SUSP
0.2500 mg | Freq: Two times a day (BID) | RESPIRATORY_TRACT | Status: DC
  Administered 2015-06-16 – 2015-06-23 (×15): 0.25 mg via RESPIRATORY_TRACT
  Filled 2015-06-15 (×15): qty 2

## 2015-06-15 MED ORDER — METOPROLOL TARTRATE 1 MG/ML IV SOLN
2.5000 mg | Freq: Three times a day (TID) | INTRAVENOUS | Status: DC
Start: 2015-06-15 — End: 2015-06-17
  Administered 2015-06-15 – 2015-06-17 (×6): 2.5 mg via INTRAVENOUS
  Filled 2015-06-15 (×6): qty 5

## 2015-06-15 NOTE — Progress Notes (Signed)
Patient ID: Emily Kennedy, female   DOB: [birth year], 70 y.o.   MRN: [id number] [facility] PROGRESS NOTE  Malia Corsi MSX:[id number] DOB: 07-15-1945 DOA: 06/13/2015 PCP: [name], MD  Brief narrative:    70 y.o. female with a past medical history significant for MGUS, HTN, NIDDM, and leukocytoclastic vasculitis who presented with altered mental status and cough, weakness and poor appetite for past 4 days PTA.  In the ED, pt was hypoxic requiring NRB, febrile to 100 2.59F, and tachycardic and tachypneic. Blood work showed sodium of 129, lactate normal. CXR showed a dense right diffuse opacity. Pt started on vanco and cefepime for HCAP treatment.    Assessment/Plan:    Principal Problem:   Acute respiratory failure with hypoxia (HCC) / Sepsis secondary to HCAP (healthcare-associated pneumonia) secondary to streptococcus pneumonia  - Sepsis criteria met on admission with fever, tachycardia, tachypnea, hypoxia with evidence of pneumonia on CXR - Diffuse right sided airspace opacifications concerning for pneumonia seen on admission CXR - Streptococcus pneumonia antigen positive  - continue current antibiotics, will start pulmicort and flutter valve; continue PRN duoneb -weaned oxygen as tolerated - influenza panel neg - Follow up blood culture results and Follow up legionella results - continue supportive care and follow clinical response     Acute Toxic encephalopathy - Due to acute infection, sepsis - Monitor mental status; unknown baseline  - will have PT to evaluate for ability and capacity performing ADL's and ambulation     Dysphagia -SPL on board, will follow rec's -currently NPO    Hyponatremia - Likely dehydration and PNA - Continue to monitor BMP  - Continue IV fluids but reduce rate to 50 cc/hr    Diabetes mellitus type 2, uncontrolled (HCC) - Continue SSI for now - She is NPO due to requirement for BiPAP initially and now with dysphagia -will  continue holding oral hypoglycemic agents     Anemia, chronic disease - Due to history of MGUS - Hemoglobin stable    Hypothyroidism - Continue synthroid    Essential hypertension - Coreg on hold due to acute respiratory failure - Added hydralazine PRN for BP control     DVT Prophylaxis  - Lovenox subQ ordered   Code Status: Full.  Family Communication:  plan of care discussed with the patient; no family at bedside  Disposition Plan: transfer to telemetry, follow clinical response and repeat CXR in am  IV access:  Peripheral IV  Procedures and diagnostic studies:    Dg Chest 2 View 05/17/2015  Probable mild congestive changes.  No focal consolidation.   Dg Chest Portable 1 View 06/13/2015   Diffuse right-sided airspace opacification raises concern for pneumonia, though asymmetric pulmonary edema could have a similar appearance. Mild vascular congestion and mild cardiomegaly noted.   Medical Consultants:  None   IAnti-Infectives:   Vanco and cefepime 06/13/2015 -->   Barton Dubois, MD  [facility] Pager [phone]  Time spent in minutes: 25 minutes  If 7PM-7AM, please contact night-coverage [email or web address] Password [facility] 06/15/2015, 11:50 AM   LOS: 2 days    HPI/Subjective: Not fever, no CP. Breathing better and no requiring BIPAP.   Objective: Filed Vitals:   06/15/15 0709 06/15/15 0725 06/15/15 1008 06/15/15 1134  BP: 151/93 117/92  148/98  Pulse: 110 113  117  Temp: 99.1 F (37.3 C)   99.1 F (37.3 C)  TempSrc: Oral   Axillary  Resp: 30 26  18   Height:      Weight:  SpO2: 94% 94% 94% 94%    Intake/Output Summary (Last 24 hours) at 06/15/15 1150 Last data filed at 06/15/15 0600  Gross per 24 hour  Intake 2138.33 ml  Output      0 ml  Net 2138.33 ml    Exam:   General:  Pt is breathing better, oriented just to person currently; no fever and with good O2 sat on 4 L [place]  Cardiovascular: mild tachycardia, no rubs or gallops  Respiratory:  Diminished bilaterally, fair air movement, mild exp wheezing;positive rhonchi   Abdomen: Soft, non tender, non distended, bowel sounds present  Extremities: 2++ edema, bilaterally  Neuro: flat affect, no focal deficit, moving four limbs; generally weak   Skin: bruises in her thighs and lower back; also with stage 1 decubitus ulcer.  Data Reviewed: Basic Metabolic Panel:  Recent Labs Lab 06/13/15 2052 06/14/15 0353  NA 129* 129*  K 3.8 3.6  CL 98* 101  CO2 19* 18*  GLUCOSE 167* 177*  BUN 17 19  CREATININE 0.73 0.77  0.73  CALCIUM 8.9 8.3*   Liver Function Tests:  Recent Labs Lab 06/13/15 2052 06/14/15 0353  AST 43* 50*  ALT 21 20  ALKPHOS 42 37*  BILITOT 1.3* 1.0  PROT 6.2* 5.9*  ALBUMIN 2.6* 2.3*   CBC:  Recent Labs Lab 06/13/15 2052 06/14/15 0353  WBC 4.0 4.5  NEUTROABS 2.5  --   HGB 10.0* 10.2*  HCT 28.7* 29.9*  MCV 89.4 85.4  PLT 178 145*   BNP: Invalid input(s): POCBNP   CBG:  Recent Labs Lab 06/14/15 0901 06/14/15 1224 06/14/15 1623 06/14/15 2200 06/15/15 0710  GLUCAP 172* 188* 125* 185* 156*    Recent Results (from the past 240 hour(s))  Blood Culture (routine x 2)     Status: None (Preliminary result)   Collection Time: 06/13/15  8:52 PM  Result Value Ref Range Status   Specimen Description BLOOD LEFT HAND  Final   Special Requests BOTTLES DRAWN AEROBIC AND ANAEROBIC 5CC  Final   Culture NO GROWTH < 24 HOURS  Final   Report Status PENDING  Incomplete  Blood Culture (routine x 2)     Status: None (Preliminary result)   Collection Time: 06/13/15  9:04 PM  Result Value Ref Range Status   Specimen Description BLOOD RIGHT WRIST  Final   Special Requests BOTTLES DRAWN AEROBIC AND ANAEROBIC 5CC  Final   Culture NO GROWTH < 24 HOURS  Final   Report Status PENDING  Incomplete  MRSA PCR Screening     Status: None   Collection Time: 06/14/15  2:15 PM  Result Value Ref Range Status   MRSA by PCR NEGATIVE NEGATIVE Final    Comment:         The GeneXpert MRSA Assay (FDA approved for NASAL specimens only), is one component of a comprehensive MRSA colonization surveillance program. It is not intended to diagnose MRSA infection nor to guide or monitor treatment for MRSA infections.      Scheduled Meds: . antiseptic oral rinse  7 mL Mouth Rinse q12n4p  . budesonide (PULMICORT) nebulizer solution  0.25 mg Nebulization BID  . ceFEPime (MAXIPIME) IV  2 g Intravenous Q12H  . chlorhexidine  15 mL Mouth Rinse BID  . enoxaparin (LOVENOX) injection  40 mg Subcutaneous Q24H  . hydrocortisone sod succinate (SOLU-CORTEF) inj  50 mg Intravenous Q12H  . insulin aspart  0-5 Units Subcutaneous QHS  . insulin aspart  0-9 Units Subcutaneous TID WC  .  sodium chloride flush  3 mL Intravenous Q12H  . vancomycin  1,000 mg Intravenous Q12H   Continuous Infusions: . sodium chloride 50 mL/hr at 06/15/15 0600

## 2015-06-15 NOTE — Progress Notes (Addendum)
Pharmacy Antibiotic Note  Emily Kennedy is a 70 y.o. female admitted on 06/13/2015 with pneumonia.  Pharmacy has been consulted for cefepime and vancomycin dosing.  Vancomycin trough 13 mcg/ml, drawn ~8h post dose. Level is subtherapeutic, given q12h dosing and goal of 15-20 mcg/ml. Last Cr 0.77. WBC wnl, Tmax 99.1. Remains tachycardic 110s, RR 20s. Will increase vancomycin dose.  Plan: Vancomycin 1000 mg IV every 12 hours.  Goal trough 15-20 mcg/mL.  Cefepime 2g IV q12h  Monitor culture data, renal function, and clinical course VT at SS prn   Height: 5\' 4"  (162.6 cm) Weight: 141 lb 12.1 oz (64.3 kg) IBW/kg (Calculated) : 54.7  Temp (24hrs), Avg:99.4 F (37.4 C), Min:98.4 F (36.9 C), Max:100.6 F (38.1 C)   Recent Labs Lab 06/13/15 2052 06/13/15 2126 06/14/15 0128 06/14/15 0353 06/15/15 0836  WBC 4.0  --   --  4.5  --   CREATININE 0.73  --   --  0.77  0.73  --   LATICACIDVEN  --  0.74 0.71  --   --   VANCOTROUGH  --   --   --   --  13    Estimated Creatinine Clearance: 58.1 mL/min (by C-G formula based on Cr of 0.73).    Allergies  Allergen Reactions  . Benadryl [Diphenhydramine] Swelling  . Ciprofloxacin Hives  . Furosemide Itching, Swelling, Rash and Other (See Comments)    Made edema worse  . Nitrofurantoin Monohyd Macro Hives  . Septra [Bactrim] Hives  . Sulfa Drugs Cross Reactors Hives  . Ceclor [Cefaclor] Rash    Breaks out in rash chest down.    Antimicrobials this admission: Cefepime 3/31 >>  Vanc 3/31 >>   Dose adjustments this admission: 4/2: VT 13 ~8h post dose, increased to 1000 mg q12h from 750 mg q12h  Microbiology results: 4/1 influenza A&B neg 4/1 H1N1 not detected 3/31 blood >> 4/1 urine >>     Heloise Ochoa, [place].D., BCPS PGY2 Pharmacy Resident Pager: [phone] 06/15/2015 10:18 AM

## 2015-06-15 NOTE — Evaluation (Signed)
Clinical/Bedside Swallow Evaluation Patient Details  Name: Emily Kennedy MRN: [id number] Date of Birth: 1946-03-12  Today's Date: 06/15/2015 Time: SLP Start Time (ACUTE ONLY): 0724 SLP Stop Time (ACUTE ONLY): 0742 SLP Time Calculation (min) (ACUTE ONLY): 18 min  Past Medical History:  Past Medical History  Diagnosis Date  . Diabetes mellitus   . Vasculitis ([place])   . Hypertension   . Thyroid disease    Past Surgical History:  Past Surgical History  Procedure Laterality Date  . Tubal ligation    . Melanoma excision     HPI:  Emily Kennedy is a 70 y.o. female with a past medical history significant for MGUS, HTN, NIDDM, and leukocytoclastic vasculitis who presents with altered mental status and cough for 4 days.  All history collected from the patient's daughter, present at the bedside because of the patient's mental status. The patient was admitted in January for leg swelling and leg rash and leg weakness, diagnosed with leukocytoclastic vasculitis flare, treated with prednisone, and had improvement in her symptoms. She was readmitted shortly after for hyponatremia, which started to improve and she was discharged home.  Since then the patient's been living at home with her daughter, working with PT, and cognitively being at her baseline without dementia. She continues to have leg weakness that greatly limits her ability to walk, this is a combination of weakness and pain.  Now the last 4 days, the patient has been increasingly weak, requiring assistance to get out of a chair. Several days ago she started coughing, and being sweaty and shaking and complaining of being cold, yesterday had emesis 2-3 times, and today was completely "out of it" and would not respond to family, so they brought her to the ED.  Most recent chest x-ray showing diffuse right sided opacity, questionable PNA vs pulmonary edema.     Assessment / Plan / Recommendation Clinical Impression  Clinical swallowing  evaluation was completed.  Oral mechanism exam unable to be completed as that patient is unable to follow any commands.  Clinically the patient presented with oropharyngeal dysphagia characterized by decreased bolus awareness, delayed oral transit, and delayed swallow trigger.  Multiple swallows were noted to clear each bolus and the patient was noted to orally hold each bolus and required mod to max cues to swallow.  Given this the patient is at risk for aspiration.  Recommend keep the patient NPO.  ST will follow up next date to re assess the patient's swallow and readiness for PO's.      Aspiration Risk  Moderate aspiration risk    Diet Recommendation   NPO pending re evaluation next date.    Medication Administration: Via alternative means    Other  Recommendations Oral Care Recommendations: Oral care QID   Follow up Recommendations    To be determined.     Frequency and Duration min 2x/week  2 weeks       Prognosis Prognosis for Safe Diet Advancement: Fair Barriers to Reach Goals: Cognitive deficits      Swallow Study   General Date of Onset: 06/13/15 HPI: Emily Kennedy is a 70 y.o. female with a past medical history significant for MGUS, HTN, NIDDM, and leukocytoclastic vasculitis who presents with altered mental status and cough for 4 days.  All history collected from the patient's daughter, present at the bedside because of the patient's mental status. The patient was admitted in January for leg swelling and leg rash and leg weakness, diagnosed with leukocytoclastic vasculitis  flare, treated with prednisone, and had improvement in her symptoms. She was readmitted shortly after for hyponatremia, which started to improve and she was discharged home.  Since then the patient's been living at home with her daughter, working with PT, and cognitively being at her baseline without dementia. She continues to have leg weakness that greatly limits her ability to walk, this is a combination  of weakness and pain.  Now the last 4 days, the patient has been increasingly weak, requiring assistance to get out of a chair. Several days ago she started coughing, and being sweaty and shaking and complaining of being cold, yesterday had emesis 2-3 times, and today was completely "out of it" and would not respond to family, so they brought her to the ED.  Most recent chest x-ray showing diffuse right sided opacity, questionable PNA vs pulmonary edema.   Type of Study: Bedside Swallow Evaluation Previous Swallow Assessment: None noted.   Diet Prior to this Study: NPO Temperature Spikes Noted: Yes Respiratory Status: Nasal cannula History of Recent Intubation: No Behavior/Cognition: Alert;Doesn't follow directions;Confused;Cooperative Oral Cavity Assessment: Dry;Dried secretions Oral Care Completed by SLP: Yes Oral Cavity - Dentition: Edentulous Self-Feeding Abilities: Total assist Patient Positioning: Upright in bed Baseline Vocal Quality: Low vocal intensity Volitional Cough: Cognitively unable to elicit Volitional Swallow: Unable to elicit    Oral/Motor/Sensory Function Overall Oral Motor/Sensory Function:  (Unable to assess - pt unable to follow commands)   Ice Chips Ice chips: Impaired Presentation: Spoon Oral Phase Impairments: Impaired mastication;Poor awareness of bolus Oral Phase Functional Implications: Prolonged oral transit;Oral holding Pharyngeal Phase Impairments: Suspected delayed Swallow;Multiple swallows   Thin Liquid Thin Liquid: Impaired Presentation: Spoon Oral Phase Impairments: Poor awareness of bolus;Impaired mastication Oral Phase Functional Implications: Oral holding;Prolonged oral transit Pharyngeal  Phase Impairments: Suspected delayed Swallow;Multiple swallows    Nectar Thick Nectar Thick Liquid: Not tested   Honey Thick Honey Thick Liquid: Not tested   Puree Puree: Impaired Presentation: Spoon Oral Phase Impairments: Poor awareness of bolus;Impaired  mastication Oral Phase Functional Implications: Oral holding;Prolonged oral transit;Oral residue Pharyngeal Phase Impairments: Suspected delayed Swallow;Multiple swallows   Solid   GO   Solid: Not tested    Functional Assessment Tool Used: ASHA NOMS and clinical judgment.   Functional Limitations: Swallowing Swallow Current Status [id number]): 100 percent impaired, limited or restricted Swallow Goal Status [id number]): At least 40 percent but less than 60 percent impaired, limited or restricted    Shelly Flatten, MA, [place] Shelly Flatten N 06/15/2015,7:51 AM

## 2015-06-15 NOTE — Progress Notes (Signed)
Report given to Georgina Peer, RN for [place] room 6.

## 2015-06-15 NOTE — Progress Notes (Signed)
Dr. Dyann Kief text paged to inform him that one bottle of anaerobic blood culture has gram positive cocci growing per lab critical call.

## 2015-06-16 ENCOUNTER — Inpatient Hospital Stay (HOSPITAL_COMMUNITY): Payer: Medicare Other

## 2015-06-16 LAB — GLUCOSE, CAPILLARY
GLUCOSE-CAPILLARY: 208 mg/dL — AB (ref 65–99)
GLUCOSE-CAPILLARY: 212 mg/dL — AB (ref 65–99)
Glucose-Capillary: 182 mg/dL — ABNORMAL HIGH (ref 65–99)
Glucose-Capillary: 242 mg/dL — ABNORMAL HIGH (ref 65–99)

## 2015-06-16 LAB — CBC
HEMATOCRIT: 29.3 % — AB (ref 36.0–46.0)
Hemoglobin: 10.2 g/dL — ABNORMAL LOW (ref 12.0–15.0)
MCH: 31 pg (ref 26.0–34.0)
MCHC: 34.8 g/dL (ref 30.0–36.0)
MCV: 89.1 fL (ref 78.0–100.0)
PLATELETS: 146 10*3/uL — AB (ref 150–400)
RBC: 3.29 MIL/uL — ABNORMAL LOW (ref 3.87–5.11)
RDW: 17.3 % — AB (ref 11.5–15.5)
WBC: 2.5 10*3/uL — AB (ref 4.0–10.5)

## 2015-06-16 LAB — BASIC METABOLIC PANEL
Anion gap: 10 (ref 5–15)
BUN: 27 mg/dL — AB (ref 6–20)
CALCIUM: 8.4 mg/dL — AB (ref 8.9–10.3)
CO2: 18 mmol/L — ABNORMAL LOW (ref 22–32)
CREATININE: 0.83 mg/dL (ref 0.44–1.00)
Chloride: 108 mmol/L (ref 101–111)
GFR calc Af Amer: >60 mL/min (ref >60)
GLUCOSE: 213 mg/dL — AB (ref 65–99)
Potassium: 3.8 mmol/L (ref 3.5–5.1)
SODIUM: 136 mmol/L (ref 135–145)

## 2015-06-16 LAB — MPO/PR-3 (ANCA) ANTIBODIES: ANCA Proteinase 3: <3.5 U/mL (ref 0.0–3.5)

## 2015-06-16 MED ORDER — FLUCONAZOLE IN SODIUM CHLORIDE 200-0.9 MG/100ML-% IV SOLN
200.0000 mg | INTRAVENOUS | Status: AC
  Administered 2015-06-16 – 2015-06-22 (×7): 200 mg via INTRAVENOUS
  Filled 2015-06-16 (×7): qty 100

## 2015-06-16 NOTE — Care Management Important Message (Signed)
Important Message  Patient Details  Name: Emily Kennedy MRN: [id number] Date of Birth: Jul 27, 1945   Medicare Important Message Given:  Yes    [name] Abena 06/16/2015, 11:25 AM

## 2015-06-16 NOTE — Evaluation (Signed)
Physical Therapy Evaluation Patient Details Name: Emily Kennedy MRN: [id number] DOB: February 13, 1946 Today's Date: 06/16/2015   History of Present Illness  70 y.o. female with h/o HTN, NIDDM, MGUS, recent hospitalization in January 2017 for LLE vasculitis admitted with HCAP, sepsis, encephalopathy, weakness.   Clinical Impression  Pt admitted with above diagnosis. Pt currently with functional limitations due to the deficits listed below (see PT Problem List). Max assist for supine to partial sit. Pt resists mobility, family states this is due to fear of falling. SNF recommended.  Pt will benefit from skilled PT to increase their independence and safety with mobility to allow discharge to the venue listed below.       Follow Up Recommendations SNF;Supervision/Assistance - 24 hour    Equipment Recommendations  None recommended by PT    Recommendations for Other Services       Precautions / Restrictions Precautions Precautions: Fall Precaution Comments: recently slid out of bed at home Restrictions Weight Bearing Restrictions: No      Mobility  Bed Mobility Overal bed mobility: Needs Assistance;+2 for physical assistance Bed Mobility: Supine to Sit     Supine to sit: Max assist;+2 for physical assistance     General bed mobility comments: assist to roll to side and raise trunk, pt would not let go of bedrail so did not achieve full upright sitting. family stated pt is very fearful of falling  Transfers                 General transfer comment: NT  Ambulation/Gait             General Gait Details: NT  Stairs            Wheelchair Mobility    Modified Rankin (Stroke Patients Only)       Balance                                             Pertinent Vitals/Pain Pain Assessment: Faces Faces Pain Scale: Hurts little more Pain Location: L hip with movement Pain Descriptors / Indicators: Sore Pain Intervention(s):  Repositioned;Patient requesting pain meds-RN notified    Home Living Family/patient expects to be discharged to:: Skilled nursing facility Living Arrangements: Children Available Help at Discharge: Family;Available 24 hours/day Type of Home: House Home Access: Level entry     Home Layout: One level Home Equipment: Walker - 2 wheels;Cane - single point;Wheelchair - Education administrator (comment)      Prior Function Level of Independence: Needs assistance   Gait / Transfers Assistance Needed: Only ambulates short distances with HHPT and  RW; using w/c for mobility more frequently due to weakness and fear of falls.   ADL's / Homemaking Assistance Needed: Mod I for ADLs.        Hand Dominance   Dominant Hand: Right    Extremity/Trunk Assessment   Upper Extremity Assessment: Overall WFL for tasks assessed           Lower Extremity Assessment: Difficult to assess due to impaired cognition      Cervical / Trunk Assessment: Kyphotic  Communication   Communication: No difficulties  Cognition Arousal/Alertness: Awake/alert Behavior During Therapy: Flat affect Overall Cognitive Status: Impaired/Different from baseline Area of Impairment: Following commands;Safety/judgement;Problem solving       Following Commands: Follows one step commands inconsistently Safety/Judgement: Decreased awareness of safety   Problem Solving: Slow  processing;Requires tactile cues;Requires verbal cues;Difficulty sequencing      General Comments      Exercises        Assessment/Plan    PT Assessment Patient needs continued PT services  PT Diagnosis Generalized weakness;Difficulty walking   PT Problem List Decreased activity tolerance;Decreased balance;Pain;Decreased mobility  PT Treatment Interventions DME instruction;Functional mobility training;Therapeutic activities;Patient/family education;Balance training;Therapeutic exercise   PT Goals (Current goals can be found in the Care Plan  section) Acute Rehab PT Goals Patient Stated Goal: to be able to walk PT Goal Formulation: With family Time For Goal Achievement: 06/30/15 Potential to Achieve Goals: Fair    Frequency Min 3X/week   Barriers to discharge Decreased caregiver support pt's son had taken leave from work to care for pt but now has to return to work    Co-evaluation               End of Session   Activity Tolerance: Patient limited by pain Patient left: in bed;with call bell/phone within reach;with bed alarm set;with family/visitor present Nurse Communication: Mobility status         Time: [id number] PT Time Calculation (min) (ACUTE ONLY): 20 min   Charges:   PT Evaluation $PT Eval Moderate Complexity: 1 Procedure     PT G Codes:        Philomena Doheny 06/16/2015, 11:32 AM [phone]

## 2015-06-16 NOTE — Progress Notes (Signed)
Speech Language Pathology Treatment: Dysphagia  Patient Details Name: Emily Kennedy MRN: [id number] DOB: [birth year] Today's Date: 06/16/2015 Time: [id number] SLP Time Calculation (min) (ACUTE ONLY): 15 min  Assessment / Plan / Recommendation Clinical Impression  Family reports that pt is more alert and communicative today as compared to previous date, and they are eager for her to start eating and drinking. SLP assisted with self-feeding of PO trials of purees and thin liquid, swith intermittent oral holding, requiring Mod cues to initiate swallow. Use of liquid wash facilitated oral transit of purees. Across trials, no overt s/s of aspiration were observed. Recommend to start Dys 1 diet and thin liquids. Prognosis for advancement good pending further improvements in mentation.   HPI HPI: Emily Kennedy is a 70 y.o. female with a past medical history significant for MGUS, HTN, NIDDM, and leukocytoclastic vasculitis who presents with altered mental status and cough for 4 days.  All history collected from the patient's daughter, present at the bedside because of the patient's mental status. The patient was admitted in January for leg swelling and leg rash and leg weakness, diagnosed with leukocytoclastic vasculitis flare, treated with prednisone, and had improvement in her symptoms. She was readmitted shortly after for hyponatremia, which started to improve and she was discharged home.  Since then the patient's been living at home with her daughter, working with PT, and cognitively being at her baseline without dementia. She continues to have leg weakness that greatly limits her ability to walk, this is a combination of weakness and pain.  Now the last 4 days, the patient has been increasingly weak, requiring assistance to get out of a chair. Several days ago she started coughing, and being sweaty and shaking and complaining of being cold, yesterday had emesis 2-3 times, and today was completely "out of  it" and would not respond to family, so they brought her to the ED.  Most recent chest x-ray showing diffuse right sided opacity, questionable PNA vs pulmonary edema.        SLP Plan  Continue with current plan of care     Recommendations  Diet recommendations: Dysphagia 1 (puree);Thin liquid Liquids provided via: Cup;Straw Medication Administration: Crushed with puree Supervision: Patient able to self feed;Full supervision/cueing for compensatory strategies Compensations: Minimize environmental distractions;Slow rate;Small sips/bites;Follow solids with liquid Postural Changes and/or Swallow Maneuvers: Seated upright 90 degrees             Oral Care Recommendations: Oral care BID Follow up Recommendations:  (tba) Plan: Continue with current plan of care     GO               Germain Osgood, M.A. CCC-SLP [phone]  Germain Osgood 06/16/2015, 11:04 AM

## 2015-06-16 NOTE — NC FL2 (Signed)
[place] LEVEL OF CARE SCREENING TOOL     IDENTIFICATION  Patient Name: Emily Kennedy Birthdate: October 30, 1945 Sex: female Admission Date (Current Location): 06/13/2015  [facility] and [place] Number:  Herbalist and Address:  [facility] [place], [place] [address] [place], [place] [address]      Provider Number: [id number]  Attending Physician Name and Address:  Robbie Lis, MD  Relative Name and Phone Number:  [name] daughter, [phone]    Current Level of Care: Hospital Recommended Level of Care: [place] Prior Approval Number:    Date Approved/Denied:   PASRR Number: [id number]  Discharge Plan: SNF    Current Diagnoses: Patient Active Problem List   Diagnosis Date Noted  . Streptococcal pneumonia ([place]) 06/14/2015  . Acute encephalopathy 06/14/2015  . HCAP (healthcare-associated pneumonia) 06/13/2015  . Acute respiratory failure with hypoxia ([place]) 06/13/2015  . Nausea and vomiting 05/18/2015  . Diabetes mellitus type 2, controlled ([place]) 05/18/2015  . Bilateral lower extremity edema 05/18/2015  . Anemia, chronic disease 05/18/2015  . Hypothyroidism 05/18/2015  . Essential hypertension 05/18/2015  . Hyponatremia 04/09/2015  . Diabetes mellitus type 2, uncontrolled ([place]) 04/09/2015  . 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    Orientation RESPIRATION BLADDER Height & Weight     Self  Normal Incontinent Weight: 141 lb 12.1 oz (64.3 kg) Height:  5\' 4"  (162.6 cm)  BEHAVIORAL SYMPTOMS/MOOD NEUROLOGICAL BOWEL NUTRITION STATUS      Incontinent Diet (Please see DC summary)  AMBULATORY STATUS COMMUNICATION OF NEEDS Skin   Extensive Assist Verbally Normal                       Personal Care Assistance Level of Assistance  Bathing, Feeding, Dressing Bathing Assistance: Maximum assistance Feeding assistance: Independent Dressing Assistance: Limited assistance     Functional  Limitations Info             SPECIAL CARE FACTORS FREQUENCY  PT (By licensed PT)     PT Frequency: min 3x/week              Contractures Contractures Info: Not present    Additional Factors Info  Code Status, Allergies, Insulin Sliding Scale Code Status Info: Full Allergies Info: Benadryl, Ciprofloxacin, Furosemide, Nitrofurantoin Monohyd Macro, Septra, Sulfa Drugs Cross Reactors, Ceclor   Insulin Sliding Scale Info: insulin aspart (novoLOG) injection 0-5 Units;insulin aspart (novoLOG) injection 0-9 Units       Current Medications (06/16/2015):  This is the current hospital active medication list Current Facility-Administered Medications  Medication Dose Route Frequency Provider Last Rate Last Dose  . 0.9 %  sodium chloride infusion   Intravenous Continuous Robbie Lis, MD 50 mL/hr at 06/16/15 0600    . acetaminophen (TYLENOL) suppository 650 mg  650 mg Rectal Q4H PRN Robbie Lis, MD   650 mg at 06/14/15 1249  . albuterol (PROVENTIL) (2.5 MG/3ML) 0.083% nebulizer solution 2.5 mg  2.5 mg Nebulization Q6H PRN Barton Dubois, MD      . antiseptic oral rinse ([place] / CETYLPYRIDINIUM CHLORIDE 0.05%) solution 7 mL  7 mL Mouth Rinse q12n4p Robbie Lis, MD   7 mL at 06/16/15 1101  . budesonide (PULMICORT) nebulizer solution 0.25 mg  0.25 mg Nebulization BID Barton Dubois, MD   0.25 mg at 06/16/15 0942  . ceFEPIme (MAXIPIME) 2 g in dextrose 5 % 50 mL IVPB  2 g Intravenous Q12H Rebecka Apley, RPH 100  mL/hr at 06/16/15 0852 2 g at 06/16/15 0852  . chlorhexidine (PERIDEX) 0.12 % solution 15 mL  15 mL Mouth Rinse BID Robbie Lis, MD   15 mL at 06/16/15 0902  . enoxaparin (LOVENOX) injection 40 mg  40 mg Subcutaneous Q24H Edwin Dada, MD   40 mg at 06/16/15 0901  . fluconazole (DIFLUCAN) IVPB 200 mg  200 mg Intravenous Q24H Robbie Lis, MD   200 mg at 06/16/15 1059  . hydrALAZINE (APRESOLINE) injection 5 mg  5 mg Intravenous Q6H PRN Robbie Lis, MD   5 mg at 06/14/15 1836   . hydrocortisone sodium succinate (SOLU-CORTEF) 100 MG injection 50 mg  50 mg Intravenous BID Barton Dubois, MD   50 mg at 06/16/15 0855  . insulin aspart (novoLOG) injection 0-5 Units  0-5 Units Subcutaneous QHS Edwin Dada, MD   0 Units at 06/14/15 2305  . insulin aspart (novoLOG) injection 0-9 Units  0-9 Units Subcutaneous TID WC Edwin Dada, MD   3 Units at 06/16/15 1234  . levothyroxine (SYNTHROID, LEVOTHROID) injection 75 mcg  75 mcg Intravenous Daily Barton Dubois, MD   75 mcg at 06/16/15 0857  . metoprolol (LOPRESSOR) injection 2.5 mg  2.5 mg Intravenous 3 times per day Barton Dubois, MD   2.5 mg at 06/16/15 1417  . ondansetron (ZOFRAN) injection 4 mg  4 mg Intravenous Q6H PRN Edwin Dada, MD      . sodium chloride flush (NS) 0.9 % injection 3 mL  3 mL Intravenous Q12H Edwin Dada, MD   3 mL at 06/15/15 2212  . vancomycin (VANCOCIN) IVPB 1000 mg/200 mL premix  1,000 mg Intravenous Q12H Wynelle Fanny, RPH   1,000 mg at 06/16/15 [id number]     Discharge Medications: Please see discharge summary for a list of discharge medications.  Relevant Imaging Results:  Relevant Lab Results:   Additional Information SS# [id number]  Cranford Mon, [place]

## 2015-06-16 NOTE — Progress Notes (Signed)
Patient ID: Emily Kennedy, female   DOB: [birth year], 70 y.o.   MRN: [id number] [facility] PROGRESS NOTE  Emily Kennedy [id number] DOB: 1945-12-07 DOA: 06/13/2015 PCP: [name], MD  Brief narrative:    70 y.o. female with a past medical history significant for MGUS, HTN, NIDDM, and leukocytoclastic vasculitis who presented with altered mental status and cough, weakness and poor appetite for past 4 days PTA.  In the ED, pt was hypoxic requiring NRB, febrile to 100 2.8F, and tachycardic and tachypneic. Blood work showed sodium of 129, lactate normal. CXR showed a dense right diffuse opacity. Pt started on vanco and cefepime for HCAP treatment.   Assessment/Plan:    Principal Problem:   Acute respiratory failure with hypoxia (HCC) / Sepsis secondary to HCAP (healthcare-associated pneumonia) secondary to streptococcus pneumonia  - Sepsis criteria met on admission with fever, tachycardia, tachypnea, hypoxia  - Diffuse right sided airspace opacifications concerning for pneumonia seen on admission CXR - Streptococcus pneumonia antigen positive  - Influenza negative  - Legionella pending - Continue current antibiotics  - SHe is on solucortef 50 mg BID - Stable respiratory status  Active Problems:   Thrush - Order placed for fluconazole    Acute Toxic encephalopathy - Due to acute infection, sepsis - Stable, improving - PT eval - SNF    Dysphagia - SLP on board, now NPO - Will attempt in next 24 hours since mental status better     Hyponatremia - Likely dehydration and PNA - Sodium normalized with hydration     Diabetes mellitus type 2, uncontrolled (HCC) - Continue SSI - CBG's in past 24 hours: 171, 182, 208    Anemia, chronic disease - Due to history of MGUS - Hemoglobin stable    Hypothyroidism - Continue synthroid    Essential hypertension - BP controlled with metoprolol 2.5 mg IV Q 8 hours     DVT Prophylaxis  - Lovenox subQ in hospital    Code Status: Full.  Family Communication:  plan of care discussed with the patient and daughter at the bedside  Disposition Plan: to SNF likely by 06/18/2015  IV access:  Peripheral IV  Procedures and diagnostic studies:    Dg Chest 2 View 05/17/2015  Probable mild congestive changes.  No focal consolidation.   Dg Chest Portable 1 View 06/13/2015   Diffuse right-sided airspace opacification raises concern for pneumonia, though asymmetric pulmonary edema could have a similar appearance. Mild vascular congestion and mild cardiomegaly noted.   Medical Consultants:  None   IAnti-Infectives:   Vanco and cefepime 06/13/2015 --> Fluconazole 06/16/2015 -->  Emily Caraveo, MD  [facility] Pager [phone]  Time spent in minutes: 25 minutes  If 7PM-7AM, please contact night-coverage [email or web address] Password [email or web address] 06/16/2015, 2:31 PM   LOS: 3 days    HPI/Subjective: Reports thrush in mouth.  Objective: Filed Vitals:   06/16/15 0558 06/16/15 0942 06/16/15 1126 06/16/15 1416  BP: 146/96   125/105  Pulse: 97 101 117 104  Temp:    98.4 F (36.9 C)  TempSrc:    Oral  Resp:  18  18  Height:      Weight:      SpO2:  97% 90% 96%    Intake/Output Summary (Last 24 hours) at 06/16/15 1431 Last data filed at 06/16/15 1300  Gross per 24 hour  Intake    950 ml  Output      0 ml  Net    950 ml    Exam:  General:  Pt is alert, no distress  Cardiovascular: (+) S1, S2, tachycardia  Respiratory: Diminished bilaterally, no wheezing  Abdomen: (+) BS, non tender  Extremities: 2+ edema, bilaterally, palpable pulses  Neuro: no focal deficits   Data Reviewed: Basic Metabolic Panel:  Recent Labs Lab 06/13/15 2052 06/14/15 0353 06/16/15 0705  NA 129* 129* 136  K 3.8 3.6 3.8  CL 98* 101 108  CO2 19* 18* 18*  GLUCOSE 167* 177* 213*  BUN 17 19 27*  CREATININE 0.73 0.77  0.73 0.83  CALCIUM 8.9 8.3* 8.4*   Liver Function Tests:  Recent Labs Lab 06/13/15 2052  06/14/15 0353  AST 43* 50*  ALT 21 20  ALKPHOS 42 37*  BILITOT 1.3* 1.0  PROT 6.2* 5.9*  ALBUMIN 2.6* 2.3*   CBC:  Recent Labs Lab 06/13/15 2052 06/14/15 0353 06/16/15 0705  WBC 4.0 4.5 2.5*  NEUTROABS 2.5  --   --   HGB 10.0* 10.2* 10.2*  HCT 28.7* 29.9* 29.3*  MCV 89.4 85.4 89.1  PLT 178 145* 146*   BNP: Invalid input(s): POCBNP   CBG:  Recent Labs Lab 06/15/15 1135 06/15/15 1642 06/15/15 2133 06/16/15 0741 06/16/15 1209  GLUCAP 175* 182* 171* 182* 208*    Recent Results (from the past 240 hour(s))  Blood Culture (routine x 2)     Status: None (Preliminary result)   Collection Time: 06/13/15  8:52 PM  Result Value Ref Range Status   Specimen Description BLOOD LEFT HAND  Final   Special Requests BOTTLES DRAWN AEROBIC AND ANAEROBIC 5CC  Final   Culture  Setup Time   Final    GRAM POSITIVE COCCI IN CHAINS IN BOTH AEROBIC AND ANAEROBIC BOTTLES CRITICAL RESULT CALLED TO, READ BACK BY AND VERIFIED WITH: S DOTY,RN AT 1330 06/15/15 BY L BENFIELD    Culture   Final    GRAM POSITIVE COCCI CULTURE REINCUBATED FOR BETTER GROWTH    Report Status PENDING  Incomplete  Blood Culture (routine x 2)     Status: None (Preliminary result)   Collection Time: 06/13/15  9:04 PM  Result Value Ref Range Status   Specimen Description BLOOD RIGHT WRIST  Final   Special Requests BOTTLES DRAWN AEROBIC AND ANAEROBIC 5CC  Final   Culture NO GROWTH 3 DAYS  Final   Report Status PENDING  Incomplete  Urine culture     Status: None   Collection Time: 06/14/15 10:00 AM  Result Value Ref Range Status   Specimen Description URINE, CATHETERIZED  Final   Special Requests NONE  Final   Culture NO GROWTH 1 DAY  Final   Report Status 06/15/2015 FINAL  Final  MRSA PCR Screening     Status: None   Collection Time: 06/14/15  2:15 PM  Result Value Ref Range Status   MRSA by PCR NEGATIVE NEGATIVE Final    Comment:        The GeneXpert MRSA Assay (FDA approved for NASAL specimens only), is  one component of a comprehensive MRSA colonization surveillance program. It is not intended to diagnose MRSA infection nor to guide or monitor treatment for MRSA infections.      Scheduled Meds: . antiseptic oral rinse  7 mL Mouth Rinse q12n4p  . budesonide (PULMICORT) nebulizer solution  0.25 mg Nebulization BID  . ceFEPime (MAXIPIME) IV  2 g Intravenous Q12H  . chlorhexidine  15 mL Mouth Rinse BID  . enoxaparin (LOVENOX) injection  40 mg Subcutaneous Q24H  . fluconazole (DIFLUCAN) IV  200 mg Intravenous Q24H  . hydrocortisone sod succinate (SOLU-CORTEF) inj  50 mg Intravenous BID  . insulin aspart  0-5 Units Subcutaneous QHS  . insulin aspart  0-9 Units Subcutaneous TID WC  . levothyroxine  75 mcg Intravenous Daily  . metoprolol  2.5 mg Intravenous 3 times per day  . sodium chloride flush  3 mL Intravenous Q12H  . vancomycin  1,000 mg Intravenous Q12H   Continuous Infusions: . sodium chloride 50 mL/hr at 06/16/15 0600

## 2015-06-16 NOTE — Clinical Social Work Note (Signed)
Clinical Social Work Assessment  Patient Details  Name: Emily Kennedy MRN: [id number] Date of Birth: 17-Nov-1945  Date of referral:  06/16/15               Reason for consult:  Facility Placement                Permission sought to share information with:  Family Supports Permission granted to share information::     Name::     Emily Kennedy  Agency::  [facility] SNFs  Relationship::  dtr  Contact Information:     Housing/Transportation Living arrangements for the past 2 months:  [place] of Information:  Adult Children Patient Interpreter Needed:  None Criminal Activity/Legal Involvement Pertinent to Current Situation/Hospitalization:  No - Comment as needed Significant Relationships:  Adult Children Lives with:  Adult Children (dtr) Do you feel safe going back to the place where you live?  No Need for family participation in patient care:  Yes (Comment) (decision making)  Care giving concerns:  Pt lives at home with dtr but pt has had several set backs in health recently making it hard for pt to be cared for at home.  Pt also has son involved but he will be returning to work soon.   Social Worker assessment / plan:  CSW spoke with pt dtr concerning plan for pt at time of DC.  Dtr explained that pt has been basically non ambulatory since January (this had come out of no where) and was attributed to her arthritis but pt was able to help transfer and so family was able to care for at home.  Over past week pt has declined further and now dtr states she can not transfer and is not safe to be at home.  Employment status:  Retired Forensic scientist:  Medicare PT Recommendations:  [place] / Referral to community resources:  [place]  Patient/Family's Response to care:  Pt dtr is agreeable to short term SNF stay to get pt back to baseline so she can return home- prefers [facility]  Patient/Family's Understanding of and  Emotional Response to Diagnosis, Current Treatment, and Prognosis:  Pt dtr is very involved and has good knowledge of pt health but is concerned MDs have been unable to figure out what is happening with pt functioning at this time.  Emotional Assessment Appearance:  Appears stated age Attitude/Demeanor/Rapport:  Unable to Assess Affect (typically observed):  Unable to Assess Orientation:  Oriented to Self Alcohol / Substance use:  Not Applicable Psych involvement (Current and /or in the community):  No (Comment)  Discharge Needs  Concerns to be addressed:  Care Coordination, Home Safety Concerns Readmission within the last 30 days:  No Current discharge risk:  Physical Impairment Barriers to Discharge:  Continued Medical Work up   Cranford Mon, LCSW 06/16/2015, 4:50 PM

## 2015-06-17 ENCOUNTER — Inpatient Hospital Stay (HOSPITAL_COMMUNITY): Payer: Medicare Other

## 2015-06-17 ENCOUNTER — Encounter (HOSPITAL_COMMUNITY): Payer: Self-pay | Admitting: Physician Assistant

## 2015-06-17 DIAGNOSIS — I10 Essential (primary) hypertension: Secondary | ICD-10-CM

## 2015-06-17 DIAGNOSIS — I5033 Acute on chronic diastolic (congestive) heart failure: Secondary | ICD-10-CM

## 2015-06-17 DIAGNOSIS — Z79899 Other long term (current) drug therapy: Secondary | ICD-10-CM

## 2015-06-17 DIAGNOSIS — J9601 Acute respiratory failure with hypoxia: Secondary | ICD-10-CM

## 2015-06-17 DIAGNOSIS — G9340 Encephalopathy, unspecified: Secondary | ICD-10-CM

## 2015-06-17 DIAGNOSIS — Z7901 Long term (current) use of anticoagulants: Secondary | ICD-10-CM

## 2015-06-17 DIAGNOSIS — I4891 Unspecified atrial fibrillation: Secondary | ICD-10-CM

## 2015-06-17 DIAGNOSIS — Z5181 Encounter for therapeutic drug level monitoring: Secondary | ICD-10-CM

## 2015-06-17 LAB — PROCALCITONIN: Procalcitonin: 0.13 ng/mL

## 2015-06-17 LAB — ANCA TITERS

## 2015-06-17 LAB — LEGIONELLA PNEUMOPHILA SEROGP 1 UR AG: L. pneumophila Serogp 1 Ur Ag: POSITIVE — AB

## 2015-06-17 LAB — GLUCOSE, CAPILLARY
Glucose-Capillary: 232 mg/dL — ABNORMAL HIGH (ref 65–99)
Glucose-Capillary: 223 mg/dL — ABNORMAL HIGH (ref 65–99)
Glucose-Capillary: 248 mg/dL — ABNORMAL HIGH (ref 65–99)
Glucose-Capillary: 272 mg/dL — ABNORMAL HIGH (ref 65–99)

## 2015-06-17 LAB — MRSA PCR SCREENING: MRSA by PCR: NEGATIVE

## 2015-06-17 MED ORDER — DILTIAZEM HCL 25 MG/5ML IV SOLN
10.0000 mg | Freq: Once | INTRAVENOUS | Status: DC
  Filled 2015-06-17: qty 5

## 2015-06-17 MED ORDER — INSULIN ASPART 100 UNIT/ML ~~LOC~~ SOLN
3.0000 [IU] | Freq: Three times a day (TID) | SUBCUTANEOUS | Status: DC
  Administered 2015-06-17 – 2015-06-20 (×3): 3 [IU] via SUBCUTANEOUS

## 2015-06-17 MED ORDER — DILTIAZEM HCL 100 MG IV SOLR
5.0000 mg/h | INTRAVENOUS | Status: DC
  Administered 2015-06-17 (×2): 10 mg/h via INTRAVENOUS
  Administered 2015-06-18: 15 mg/h via INTRAVENOUS
  Filled 2015-06-17 (×3): qty 100

## 2015-06-17 MED ORDER — DEXTROSE 5 % IV SOLN
500.0000 mg | INTRAVENOUS | Status: DC
  Administered 2015-06-17 – 2015-06-21 (×5): 500 mg via INTRAVENOUS
  Filled 2015-06-17 (×7): qty 500

## 2015-06-17 MED ORDER — METOPROLOL TARTRATE 1 MG/ML IV SOLN: 5.0000 mg | Freq: Four times a day (QID) | INTRAVENOUS | Status: DC

## 2015-06-17 MED ORDER — AMIODARONE HCL IN DEXTROSE 360-4.14 MG/200ML-% IV SOLN
60.0000 mg/h | INTRAVENOUS | Status: AC
Start: 2015-06-17 — End: 2015-06-18
  Administered 2015-06-17 (×2): 60 mg/h via INTRAVENOUS
  Filled 2015-06-17 (×3): qty 200

## 2015-06-17 MED ORDER — METOPROLOL TARTRATE 1 MG/ML IV SOLN
INTRAVENOUS | Status: AC
  Filled 2015-06-17: qty 5

## 2015-06-17 MED ORDER — FUROSEMIDE 10 MG/ML IJ SOLN
20.0000 mg | Freq: Two times a day (BID) | INTRAMUSCULAR | Status: DC
  Administered 2015-06-17 – 2015-06-19 (×4): 20 mg via INTRAVENOUS
  Filled 2015-06-17 (×4): qty 2

## 2015-06-17 MED ORDER — METOPROLOL TARTRATE 1 MG/ML IV SOLN: 5.0000 mg | Freq: Four times a day (QID) | INTRAVENOUS | Status: DC | PRN

## 2015-06-17 MED ORDER — AMIODARONE LOAD VIA INFUSION
150.0000 mg | Freq: Once | INTRAVENOUS | Status: AC
  Administered 2015-06-17: 150 mg via INTRAVENOUS
  Filled 2015-06-17: qty 83.34

## 2015-06-17 MED ORDER — AMIODARONE HCL IN DEXTROSE 360-4.14 MG/200ML-% IV SOLN
30.0000 mg/h | INTRAVENOUS | Status: DC
  Administered 2015-06-18 – 2015-06-21 (×7): 30 mg/h via INTRAVENOUS
  Filled 2015-06-17 (×8): qty 200

## 2015-06-17 NOTE — Progress Notes (Addendum)
Heart rate sustaining in 130's-160's. Paged Dr Charlies Silvers. Waiting to hear back from dr. Pt resting in bed at this time.   Order placed for Cardizem 10 mg IV once Order placed for Cardizem infusion Order placed for 12 lead EKG and troponin x 3 Order placed for transfer to [place] consulted.  Leisa Lenz [facility] [phone]

## 2015-06-17 NOTE — Progress Notes (Signed)
Inpatient Diabetes Program Recommendations  AACE/ADA: New Consensus Statement on Inpatient Glycemic Control (2015)  Target Ranges:  Prepandial:   less than 140 mg/dL      Peak postprandial:   less than 180 mg/dL (1-2 hours)      Critically ill patients:  140 - 180 mg/dL   Results for TRENA, BLAMER (MRN [id number]) as of 06/17/2015 09:42  Ref. Range 06/16/2015 07:41 06/16/2015 12:09 06/16/2015 17:06 06/16/2015 21:46 06/17/2015 08:10  Glucose-Capillary Latest Ref Range: 65-99 mg/dL 182 (H) 208 (H) 212 (H) 242 (H) 232 (H)   Review of Glycemic Control  Diabetes history: DM 2 Outpatient Diabetes medications: Metformin 1,000 mg BID Current orders for Inpatient glycemic control: Novolog Sensitive + HS  Inpatient Diabetes Program Recommendations: Correction (SSI): Glucose in the 200's. If patient is not d/c'd, patient still on IV Solucortef 50 mg BID, Please increase correction to Novolog Moderate TID.  Thanks,  Tama Headings RN, MSN, [facility] Inpatient Diabetes Coordinator Team Pager [phone] (8a-5p)

## 2015-06-17 NOTE — Consult Note (Signed)
CARDIOLOGY CONSULT NOTE   Patient ID: Emily Kennedy MRN: [id number] DOB/AGE: 08/01/1945 70 y.o.  Admit date: 06/13/2015  Primary Physician   [name], MD Primary Cardiologist   New Reason for Consultation   Rapid atrial fib  [id number] Emily Kennedy is a 70 y.o. year old female with a history of DM, HTN, thyroid dz, MGUS, HTN, NIDDM, and leukocytoclastic vasculitis.  She was admitted 03/31 with altered mental status and cough. HCAP with sepsis.  Since admission, she has not returned to baseline mental status. She is no longer febrile. She was felt to be improving, on ABX, steroids. Blood cultures came back with G+ bacteremia. Plan was for d/c to SNF once medically stable.   Today, pt went into rapid atrial fib, HR >190 at times. Upon evaluation, pt moves head and moans to stimulus, but does not answer questions. She has responded a little to IV Cardizem, HR now 130s-180s. Her respiratory rate is elevated, no cough. She is not febrile.  Additional information was obtained from staff and records.   Past Medical History  Diagnosis Date  . Diabetes mellitus   . Vasculitis ([place])   . Hypertension   . Thyroid disease     Past Surgical History  Procedure Laterality Date  . Tubal ligation    . Melanoma excision     Allergies  Allergen Reactions  . Benadryl [Diphenhydramine] Swelling  . Ciprofloxacin Hives  . Furosemide Itching, Swelling, Rash and Other (See Comments)    Made edema worse  . Nitrofurantoin Monohyd Macro Hives  . Septra [Bactrim] Hives  . Sulfa Drugs Cross Reactors Hives  . Ceclor [Cefaclor] Rash    Breaks out in rash chest down.   I have reviewed the patient's current medications . antiseptic oral rinse  7 mL Mouth Rinse q12n4p  . azithromycin  500 mg Intravenous Q24H  . budesonide (PULMICORT) nebulizer solution  0.25 mg Nebulization BID  . ceFEPime (MAXIPIME) IV  2 g Intravenous Q12H  . chlorhexidine  15 mL Mouth Rinse BID  . diltiazem  10 mg  Intravenous Once  . enoxaparin (LOVENOX) injection  40 mg Subcutaneous Q24H  . fluconazole (DIFLUCAN) IV  200 mg Intravenous Q24H  . hydrocortisone sod succinate (SOLU-CORTEF) inj  50 mg Intravenous BID  . insulin aspart  0-5 Units Subcutaneous QHS  . insulin aspart  0-9 Units Subcutaneous TID WC  . insulin aspart  3 Units Subcutaneous TID WC  . levothyroxine  75 mcg Intravenous Daily  . sodium chloride flush  3 mL Intravenous Q12H  . vancomycin  1,000 mg Intravenous Q12H   . sodium chloride 50 mL/hr at 06/16/15 0600  . amiodarone     Followed by  . [START ON 06/18/2015] amiodarone    . diltiazem (CARDIZEM) infusion     acetaminophen, albuterol, hydrALAZINE, [DISCONTINUED] ondansetron **OR** ondansetron (ZOFRAN) IV  Prior to Admission medications   Medication Sig Start Date End Date Taking? Authorizing Provider  acetaminophen (TYLENOL) 650 MG CR tablet Take 650 mg by mouth every 8 (eight) hours as needed for pain or fever.   Yes Historical Provider, MD  aspirin EC 81 MG tablet Take 81 mg by mouth daily with supper.    Yes Historical Provider, MD  carvedilol (COREG) 25 MG tablet Take 1 tablet (25 mg total) by mouth 2 (two) times daily with a meal. 04/11/15  Yes Maryann Mikhail, DO  DULoxetine (CYMBALTA) 30 MG capsule Take 30 mg by mouth 2 (two) times daily.  05/16/15  Yes Historical Provider, MD  Glucos-Chondroit-Boron-C-Mn ([employer] GLUCOS-CHONDROIT TRIPLE ST) TABS Take 2 tablets by mouth daily with supper. 05/08/15  Yes Historical Provider, MD  hydrALAZINE (APRESOLINE) 25 MG tablet Take 1 tablet (25 mg total) by mouth every 8 (eight) hours. Patient taking differently: Take 50 mg by mouth every 8 (eight) hours.  04/11/15  Yes Maryann Mikhail, DO  levothyroxine (SYNTHROID, LEVOTHROID) 75 MCG tablet Take 150 mcg by mouth daily before breakfast.   Yes Historical Provider, MD  metFORMIN (GLUCOPHAGE) 1000 MG tablet Take 1,000 mg by mouth 2 (two) times daily with a meal.    Yes Historical Provider, MD    furosemide (LASIX) 20 MG tablet Take 2 tablets (40 mg total) by mouth daily. 05/20/15   Delfina Redwood, MD  levothyroxine (SYNTHROID, LEVOTHROID) 150 MCG tablet Take 1 tablet (150 mcg total) by mouth daily. Take 2 tablets (150 mcg) by mouth on Sundays, take 1 tablet (75 mcg) Monday thru Saturday 05/20/15   Delfina Redwood, MD  predniSONE (DELTASONE) 20 MG tablet Take 2 tablets (40 mg total) by mouth daily with breakfast. Until gone 05/20/15   Delfina Redwood, MD     Social History   Social History  . Marital Status: Divorced    Spouse Name: N/A  . Number of Children: N/A  . Years of Education: N/A   Occupational History  . Retired    Social History Main Topics  . Smoking status: Never Smoker   . Smokeless tobacco: Never Used  . Alcohol Use: No  . Drug Use: No  . Sexual Activity: Not on file   Other Topics Concern  . Not on file   Social History Narrative    Family Status  Relation Status Death Age  . Sister Deceased    Family History  Problem Relation Age of Onset  . Cancer Sister      ROS:  Full 14 point review of systems complete and found to be negative unless listed above.  Physical Exam: Blood pressure 151/86, pulse 121, temperature 99.2 F (37.3 C), temperature source Oral, resp. rate 18, height 5\' 4"  (1.626 m), weight 141 lb 12.1 oz (64.3 kg), SpO2 93 %.  General: frail, elderly, female in no acute distress Head: Eyes PERRLA, No xanthomas.   Normocephalic and atraumatic, oropharynx without edema or exudate. Dentition: poor Lungs: bibasilar rales Heart: Heart irregular rate and rhythm with S1, S2, no murmur. pulses are 2+ all 4 extrem.   Neck: No carotid bruits. No lymphadenopathy.  JVD 9 cm. Abdomen: Bowel sounds present, abdomen soft and non-tender without masses or hernias noted. Msk:  Generalized weakness, no joint deformities or effusions. Extremities: No clubbing or cyanosis. No edema.  Skin: No rashes or lesions noted.  Labs:   Lab Results   Component Value Date   WBC 2.5* 06/16/2015   HGB 10.2* 06/16/2015   HCT 29.3* 06/16/2015   MCV 89.1 06/16/2015   PLT 146* 06/16/2015     Recent Labs Lab 06/14/15 0353 06/16/15 0705  NA 129* 136  K 3.6 3.8  CL 101 108  CO2 18* 18*  BUN 19 27*  CREATININE 0.77  0.73 0.83  CALCIUM 8.3* 8.4*  PROT 5.9*  --   BILITOT 1.0  --   ALKPHOS 37*  --   ALT 20  --   AST 50*  --   GLUCOSE 177* 213*  ALBUMIN 2.3*  --    TSH  Date/Time Value Ref Range Status  06/14/2015 03:53 AM 4.319  0.350 - 4.500 uIU/mL Final   Echo: 05/19/2015 - Left ventricle: The cavity size was normal. Wall thickness was  increased in a pattern of mild LVH. Systolic function was normal.  The estimated ejection fraction was in the range of 60% to 65%.  Features are consistent with a pseudonormal left ventricular  filling pattern, with concomitant abnormal relaxation and  increased filling pressure (grade 2 diastolic dysfunction). - Left atrium: The atrium was moderately dilated. - Pericardium, extracardiac: A trivial pericardial effusion was  identified.  ECG: 04/04 17:10  Atrial fib, rate 191  ECG: 04/04 17:11 SR>>atrial fib  Radiology:  Dg Chest 2 View 06/16/2015  CLINICAL DATA:  Shortness of breath.  Weakness. EXAM: CHEST  2 VIEW COMPARISON:  06/13/2015 FINDINGS: Improved but not resolved interstitial and airspace opacities in both lungs, more confluent at the lung bases, with Kerley B-lines, right greater than left interstitial accentuation, and blunting of both costophrenic angles compatible with small to moderate bilateral pleural effusions. The previous airspace edema which was asymmetric to the right side has significantly improved in the remaining edema is primarily interstitial. Mild enlargement of the cardiopericardial silhouette is present. IMPRESSION: 1. Improved congestive heart failure, with residual interstitial edema and bilateral small to moderate pleural effusions with passive  atelectasis, but with resolution of most of the airspace edema previously seen. 2. Mild enlargement of the cardiopericardial silhouette. Electronically Signed   By: Van Clines M.D.   On: 06/16/2015 08:53   TTE: 05/20/2015 - Left ventricle: The cavity size was normal. Wall thickness was  increased in a pattern of mild LVH. Systolic function was normal.  The estimated ejection fraction was in the range of 60% to 65%.  Features are consistent with a pseudonormal left ventricular  filling pattern, with concomitant abnormal relaxation and  increased filling pressure (grade 2 diastolic dysfunction). - Left atrium: The atrium was moderately dilated. - Pericardium, extracardiac: A trivial pericardial effusion was  identified.    ASSESSMENT AND PLAN:   The patient was seen today by Dr Meda Coffee, the patient evaluated and the data reviewed.       Atrial fib, rapid ventricular response - tx stepdown - documented SR this pm - HR not very responsive to IV Cardizem - will start amio - if no improvement, may need DCCV - CHADS2VASC=4 - with poor general medical condition, will need careful decision on oral anticoagulants    Otherwise, per IM Principal Problem:   Acute respiratory failure with hypoxia (HCC) Active Problems:   MGUS (monoclonal gammopathy of unknown significance)   Hyponatremia   Diabetes mellitus type 2, uncontrolled (HCC)   Anemia, chronic disease   Hypothyroidism   Essential hypertension   HCAP (healthcare-associated pneumonia)   Streptococcal pneumonia ([place])   Acute encephalopathy   Signed: Rosaria Ferries, PA-C 06/17/2015 6:06 PM Beeper [id number]  Co-Sign MD  The patient was seen, examined and discussed with Rosaria Ferries, PA-C and I agree with the above.   70 year old female with MGUS, HTN, NIDDM, and leukocytoclastic vasculitis admitted on 06/13/2015 with HCAP with sepsis.  She went into a-fib with this RVR around 3 pm this afternoon, I have personally  reviewed her telemetry strips and has been in sinus tachycardia with very frequent PACs and short runs of a-fib all day, until she went into a-fib at 3 pm and still having short intermittent episodes of SR in between. The fastest ventricular rate 190 BPM with a-fib. She was in acute respiratory distress on BipAP saturating well, maintaining  BP > 110, still very SOB with ventricular rate 120-160 BPM. There was also a run of nsVT with 8 beats.  She has crackles at the lung bases. Echo from 05/19/15 shows normal LVEF with grade 2 diastolic dysfunction, elevated filling pressures and moderately dilated left atrium.   I would start Amiodarone drip and discontinue Cardizem once HR persistently bellow 120 BPM. Treat underlying sepsis. The patient will require long term anticoagulation, CHADS-VASc 4, platelets are rather low, we will follow.  I would switch lasix to 20 mg iv BID for acute on chronic diastolic CHF, we will follow closely.  Dorothy Spark 06/17/2015

## 2015-06-17 NOTE — Progress Notes (Signed)
Paged Dr Rogers Blocker 3 times regarding elevated heart rate. Heart rate sustaining in 150's to 180's. EKG obtained resulting in AFIB with RVR. BP stable. CBG obtained. Charge nurse notified and Rapid Response team.  Dr Rogers Blocker returned call. Transferring pt to stepdown unit. Report given to [facility]. All questions answered. RR nurse initiated Cardizem drip. Nonrebreather placed on pt d/t O2 sat 86%. O2 sat previously 92% on 2L O2. Pt transferred to 2C-05 in bed by RR nurse and Shinita,RN.

## 2015-06-17 NOTE — Significant Event (Signed)
Rapid Response Event Note  Overview:  Called to assist with patient with elevated HR Time Called: 1720 Arrival Time: 1725 Event Type: Cardiac  Initial Focused Assessment:  On arrival patient supine in bed - pale - warm to touch - looks when name called - does not follow commands and is non-verbal which RN states has been her baseline since admission.  Looks comfortable - no signs of pain - NAD - moans when moved -  RR 24 - bil BS present - fine crackles in bases - O2 sats 92% on 4 liter nasal cannula - little pedal edema - no JVD noted.  Monitor shows rapid rhythm - confirmed afib with 12 lead.  BP 151/86 HR 130-160.     Interventions:  RN on phone with Dr. Charlies Silvers - orders given.  Cardiology to come see patient also per her order.  10 mg Cardizem IV bolus given - HR 120 - BP 136/74.  O2 sats decreasing to 84% despite increase in O2 to 6 liters - placed on NRB mask while HR elevated.  Rosaria Ferries NP to bedside.  1750:  Second 10 mg bolus IV Cardizem given.  Cardizem 1: 1 drip started at 5 mg/hr.  BP 132/88 HR fluctuates 130-190.  Patient remains comfortable NAD noted.  Cardizem increased to 10 mg/hr.  1800 108/75 HR 143-176.  Dr. Meda Coffee to bedside.  Orders to start Amiodarone and leave Cardizem drip running til HR in the 120's.  1810:  125/72 HR 152 RR 22 - O2 sats 100%.  Transferred to 2C05 - handoff to Elmyra Ricks - orders for Amiodarone/Cardizem clarified with Elmyra Ricks.     Event Summary: Name of Physician Notified: Dr. Charlies Silvers at  (pta RRT)  Name of Consulting Physician Notified: Dr. Ottie Glazier at  [phone])  Outcome: Transferred (Comment) (to [place])  Event End Time: 1810  Quin Hoop

## 2015-06-17 NOTE — Progress Notes (Signed)
Speech Language Pathology Treatment: Dysphagia  Patient Details Name: Emily Kennedy MRN: [id number] DOB: Feb 02, 1946 Today's Date: 06/17/2015 Time: [id number] SLP Time Calculation (min) (ACUTE ONLY): 19 min  Assessment / Plan / Recommendation Clinical Impression  Pt seen with am meal. Alert upon arrival, but not follow commands or responding verbally to SLP. Provided repositioning and hand over hand assist for self feeding as pt was not iniating despite verbal cues. Pt sipped juice from straw initially, but held bolus with some piecemealing and swallowing. Holding eventually led to coughing x2 during session. Verbal, tactile, dry spoon, head tilt did not trigger complete bolus transit. Pt would not accept any further boluses and turned head away. Suspect pt did not want breakfast this am which may have contributed to holding. Recommend pt continue current diet with hope for better success with meals later in the day. Suction should be present if needed. Will continue to follow.    HPI HPI: Emily Kennedy is a 70 y.o. female with a past medical history significant for MGUS, HTN, NIDDM, and leukocytoclastic vasculitis who presents with altered mental status and cough for 4 days.  All history collected from the patient's daughter, present at the bedside because of the patient's mental status. The patient was admitted in January for leg swelling and leg rash and leg weakness, diagnosed with leukocytoclastic vasculitis flare, treated with prednisone, and had improvement in her symptoms. She was readmitted shortly after for hyponatremia, which started to improve and she was discharged home.  Since then the patient's been living at home with her daughter, working with PT, and cognitively being at her baseline without dementia. She continues to have leg weakness that greatly limits her ability to walk, this is a combination of weakness and pain.  Now the last 4 days, the patient has been increasingly weak,  requiring assistance to get out of a chair. Several days ago she started coughing, and being sweaty and shaking and complaining of being cold, yesterday had emesis 2-3 times, and today was completely "out of it" and would not respond to family, so they brought her to the ED.  Most recent chest x-ray showing diffuse right sided opacity, questionable PNA vs pulmonary edema.        SLP Plan  Continue with current plan of care     Recommendations  Diet recommendations: Dysphagia 1 (puree);Thin liquid Liquids provided via: Cup;Straw Medication Administration: Crushed with puree Supervision: Full supervision/cueing for compensatory strategies Compensations: Minimize environmental distractions;Slow rate;Small sips/bites;Follow solids with liquid Postural Changes and/or Swallow Maneuvers: Seated upright 90 degrees             Plan: Continue with current plan of care     GO               [facility], MA CCC-SLP [id number]  Lynann Beaver 06/17/2015, 9:10 AM

## 2015-06-17 NOTE — Progress Notes (Signed)
Patient ID: Emily Kennedy, female   DOB: 1945/10/21, 70 y.o.   MRN: [id number] [facility] PROGRESS NOTE  Graelyn Bihl [id number] DOB: [birth year] DOA: 06/13/2015 PCP: [name], MD  Brief narrative:    70 y.o. female with a past medical history significant for MGUS, HTN, NIDDM, and leukocytoclastic vasculitis who presented with altered mental status and cough, weakness and poor appetite for past 4 days PTA.  In the ED, pt was hypoxic requiring NRB, febrile to 100 2.38F, and tachycardic and tachypneic. Blood work showed sodium of 129, lactate normal. CXR showed a dense right diffuse opacity. Pt started on vanco and cefepime for HCAP treatment.   Assessment/Plan:    Principal Problem:   Acute respiratory failure with hypoxia (HCC) / Sepsis secondary to HCAP (healthcare-associated pneumonia) secondary to streptococcus pneumonia / Leukopenia  - Sepsis criteria met on admission with fever, tachycardia, tachypnea, hypoxia  - Source of infection likely pneumonia as evidenced on chest x-ray on the admission, right-sided airspace opacities concerning for pneumonia - Streptococcus pneumonia antigen positive  - Influenza negative  - Legionella still pending as of 06/17/2015 - Continue current antibiotics: vanco and cefepime  - Taper down steroids  Active Problems:   Gram-positive bacteremia - Gram-positive cocci in chains in blood culture on the admission - Follow up on final report - Obtain blood cultures today to ensure clearance of bacteremia - Continue current antibiotics    Thrush - Continue fluconazole    Acute Toxic encephalopathy - Due to acute infection, sepsis - Once medically stable she will be going to skilled nursing facility per physical therapy evaluation and recommendation - Appreciate social work assisting discharge planning    Dysphagia - SLP - Now recommends dysphasia 1 diet, order placed    Hyponatremia - Likely dehydration and PNA - Sodium  normalized with IV fluids    Diabetes mellitus type 2, uncontrolled (HCC) - Continue SSI - CBGs in past 24 hours: 242, 232, 223 - We will add a NovoLog 3 units 3 times daily for better CBG control    Anemia, chronic disease - Due to history of MGUS - Hemoglobin stable at 10.2    Hypothyroidism - Continue synthroid    Essential hypertension - Continue metoprolol 2.5 mg IV Q 8 hours     DVT Prophylaxis  - Lovenox subQ ordered   Code Status: Full.  Family Communication:  plan of care discussed with the patient and daughter at the bedside  Disposition Plan: to SNF possibly by 4/6 if she feels better   IV access:  Peripheral IV  Procedures and diagnostic studies:    Dg Chest 2 View 05/17/2015  Probable mild congestive changes.  No focal consolidation.   Dg Chest Portable 1 View 06/13/2015   Diffuse right-sided airspace opacification raises concern for pneumonia, though asymmetric pulmonary edema could have a similar appearance. Mild vascular congestion and mild cardiomegaly noted.   Medical Consultants:  None   IAnti-Infectives:   Vanco and cefepime 06/13/2015 --> Fluconazole 06/16/2015 -->   Emmry Hinsch, MD  [facility] Pager [phone]  Time spent in minutes: 25 minutes  If 7PM-7AM, please contact night-coverage [email or web address] Password [email or web address] 06/17/2015, 12:26 PM   LOS: 4 days    HPI/Subjective: She says she is okay.   Objective: Filed Vitals:   06/16/15 2007 06/16/15 2149 06/17/15 0447 06/17/15 0801  BP:  142/82 165/94   Pulse:  101 110   Temp:  98.5 F (36.9 C) 97.6 F (36.4 C)   TempSrc:  Resp:  16 16   Height:      Weight:      SpO2: 96% 92% 92% 91%    Intake/Output Summary (Last 24 hours) at 06/17/15 1226 Last data filed at 06/17/15 7893  Gross per 24 hour  Intake   1640 ml  Output      0 ml  Net   1640 ml    Exam:   General:  Pt is not in distress   Cardiovascular: slightly tachycardic, appreciate S1, S2, murmur appreciated     Respiratory: Diminished bilaterally, no rhonchi or wheezing   Abdomen: non tender abd, non distended, appreciate bowel sounds   Extremities: edema (+) , palpable pulses   Neuro: nonfocal   Data Reviewed: Basic Metabolic Panel:  Recent Labs Lab 06/13/15 2052 06/14/15 0353 06/16/15 0705  NA 129* 129* 136  K 3.8 3.6 3.8  CL 98* 101 108  CO2 19* 18* 18*  GLUCOSE 167* 177* 213*  BUN 17 19 27*  CREATININE 0.73 0.77  0.73 0.83  CALCIUM 8.9 8.3* 8.4*   Liver Function Tests:  Recent Labs Lab 06/13/15 2052 06/14/15 0353  AST 43* 50*  ALT 21 20  ALKPHOS 42 37*  BILITOT 1.3* 1.0  PROT 6.2* 5.9*  ALBUMIN 2.6* 2.3*   CBC:  Recent Labs Lab 06/13/15 2052 06/14/15 0353 06/16/15 0705  WBC 4.0 4.5 2.5*  NEUTROABS 2.5  --   --   HGB 10.0* 10.2* 10.2*  HCT 28.7* 29.9* 29.3*  MCV 89.4 85.4 89.1  PLT 178 145* 146*   BNP: Invalid input(s): POCBNP   CBG:  Recent Labs Lab 06/16/15 1209 06/16/15 1706 06/16/15 2146 06/17/15 0810 06/17/15 1210  GLUCAP 208* 212* 242* 232* 223*    Recent Results (from the past 240 hour(s))  Blood Culture (routine x 2)     Status: None (Preliminary result)   Collection Time: 06/13/15  8:52 PM  Result Value Ref Range Status   Specimen Description BLOOD LEFT HAND  Final   Special Requests BOTTLES DRAWN AEROBIC AND ANAEROBIC 5CC  Final   Culture  Setup Time   Final    GRAM POSITIVE COCCI IN CHAINS ANAEROBIC BOTTLE ONLY CRITICAL RESULT CALLED TO, READ BACK BY AND VERIFIED WITH: S DOTY,RN AT 1330 06/15/15 BY L BENFIELD GRAM POSITIVE RODS AEROBIC BOTTLE ONLY CORRECTED RESULTS CALLED TO: C HANCOCK,RN AT 1046 06/17/15 BY L BENFIELD    Culture   Final    GRAM POSITIVE COCCI CULTURE REINCUBATED FOR BETTER GROWTH DIPHTHEROIDS(CORYNEBACTERIUM SPECIES) Standardized susceptibility testing for this organism is not available.    Report Status PENDING  Incomplete  Blood Culture (routine x 2)     Status: None (Preliminary result)   Collection  Time: 06/13/15  9:04 PM  Result Value Ref Range Status   Specimen Description BLOOD RIGHT WRIST  Final   Special Requests BOTTLES DRAWN AEROBIC AND ANAEROBIC 5CC  Final   Culture NO GROWTH 3 DAYS  Final   Report Status PENDING  Incomplete  Urine culture     Status: None   Collection Time: 06/14/15 10:00 AM  Result Value Ref Range Status   Specimen Description URINE, CATHETERIZED  Final   Special Requests NONE  Final   Culture NO GROWTH 1 DAY  Final   Report Status 06/15/2015 FINAL  Final  MRSA PCR Screening     Status: None   Collection Time: 06/14/15  2:15 PM  Result Value Ref Range Status   MRSA by PCR NEGATIVE NEGATIVE Final  Comment:        The GeneXpert MRSA Assay (FDA approved for NASAL specimens only), is one component of a comprehensive MRSA colonization surveillance program. It is not intended to diagnose MRSA infection nor to guide or monitor treatment for MRSA infections.      Scheduled Meds: . antiseptic oral rinse  7 mL Mouth Rinse q12n4p  . budesonide (PULMICORT) nebulizer solution  0.25 mg Nebulization BID  . ceFEPime (MAXIPIME) IV  2 g Intravenous Q12H  . chlorhexidine  15 mL Mouth Rinse BID  . enoxaparin (LOVENOX) injection  40 mg Subcutaneous Q24H  . fluconazole (DIFLUCAN) IV  200 mg Intravenous Q24H  . hydrocortisone sod succinate (SOLU-CORTEF) inj  50 mg Intravenous BID  . insulin aspart  0-5 Units Subcutaneous QHS  . insulin aspart  0-9 Units Subcutaneous TID WC  . levothyroxine  75 mcg Intravenous Daily  . metoprolol  2.5 mg Intravenous 3 times per day  . sodium chloride flush  3 mL Intravenous Q12H  . vancomycin  1,000 mg Intravenous Q12H   Continuous Infusions: . sodium chloride 50 mL/hr at 06/16/15 0600

## 2015-06-18 ENCOUNTER — Ambulatory Visit: Payer: Medicare Other | Admitting: Podiatry

## 2015-06-18 DIAGNOSIS — I4891 Unspecified atrial fibrillation: Secondary | ICD-10-CM | POA: Diagnosis not present

## 2015-06-18 DIAGNOSIS — I5032 Chronic diastolic (congestive) heart failure: Secondary | ICD-10-CM | POA: Diagnosis present

## 2015-06-18 DIAGNOSIS — A481 Legionnaires' disease: Secondary | ICD-10-CM | POA: Diagnosis present

## 2015-06-18 DIAGNOSIS — J154 Pneumonia due to other streptococci: Secondary | ICD-10-CM

## 2015-06-18 LAB — GLUCOSE, CAPILLARY
Glucose-Capillary: 268 mg/dL — ABNORMAL HIGH (ref 65–99)
Glucose-Capillary: 238 mg/dL — ABNORMAL HIGH (ref 65–99)
Glucose-Capillary: 222 mg/dL — ABNORMAL HIGH (ref 65–99)

## 2015-06-18 LAB — BASIC METABOLIC PANEL
ANION GAP: 11 (ref 5–15)
BUN: 28 mg/dL — ABNORMAL HIGH (ref 6–20)
CALCIUM: 8.5 mg/dL — AB (ref 8.9–10.3)
CO2: 18 mmol/L — ABNORMAL LOW (ref 22–32)
Chloride: 106 mmol/L (ref 101–111)
Creatinine, Ser: 0.78 mg/dL (ref 0.44–1.00)
GFR calc Af Amer: >60 mL/min (ref >60)
Glucose, Bld: 278 mg/dL — ABNORMAL HIGH (ref 65–99)
POTASSIUM: 3.5 mmol/L (ref 3.5–5.1)
SODIUM: 135 mmol/L (ref 135–145)

## 2015-06-18 LAB — CBC
HCT: 28.8 % — ABNORMAL LOW (ref 36.0–46.0)
Hemoglobin: 9.7 g/dL — ABNORMAL LOW (ref 12.0–15.0)
MCH: 29.8 pg (ref 26.0–34.0)
MCHC: 33.7 g/dL (ref 30.0–36.0)
MCV: 88.6 fL (ref 78.0–100.0)
PLATELETS: 129 10*3/uL — AB (ref 150–400)
RBC: 3.25 MIL/uL — AB (ref 3.87–5.11)
RDW: 17.2 % — ABNORMAL HIGH (ref 11.5–15.5)
WBC: 3.1 10*3/uL — AB (ref 4.0–10.5)

## 2015-06-18 LAB — VANCOMYCIN, TROUGH: VANCOMYCIN TR: 38 ug/mL — AB (ref 10.0–20.0)

## 2015-06-18 LAB — CULTURE, BLOOD (ROUTINE X 2): CULTURE: NO GROWTH

## 2015-06-18 LAB — TSH: TSH: 2.834 u[IU]/mL (ref 0.350–4.500)

## 2015-06-18 MED ORDER — DILTIAZEM HCL ER COATED BEADS 120 MG PO CP24
120.0000 mg | ORAL_CAPSULE | Freq: Every day | ORAL | Status: DC
  Administered 2015-06-18: 120 mg via ORAL
  Filled 2015-06-18 (×2): qty 1

## 2015-06-18 NOTE — Progress Notes (Signed)
SLP Cancellation Note  Patient Details Name: Melondy Tiffin MRN: [id number] DOB: 04-09-1945   Cancelled treatment:       Reason Eval/Treat Not Completed: Other (comment) SLP offered PO trials to assess swallow function and diet tolerance. Son at bedside politely declines trials this morning, stating that pt has not been wanting POs this morning or most of yesterday. He reports good intake on the day of this SLP's last visit, with poor intake since that time. Reviewed the risks of aspiration and s/s of dysphagia and aspiration to monitor. Will continue to follow.   Germain Osgood, M.A. CCC-SLP [phone]  Germain Osgood 06/18/2015, 10:31 AM

## 2015-06-18 NOTE — Progress Notes (Signed)
CRITICAL VALUE ALERT  Critical value received:  Vancomycin level high at 38  Date of notification:  06/18/2015   Time of notification:  [id number]  Critical value read back:Yes.    Nurse who received alert:  Latricia Heft RN   MD notified (1st page):  Pharmacy   Time of first page: 1145  MD notified (2nd page):  Time of second page:  Responding MD:  Pharmacy    Time MD responded:  [id number]

## 2015-06-18 NOTE — Progress Notes (Signed)
Converted into NSR around 2330 on 06/17/15

## 2015-06-18 NOTE — Progress Notes (Signed)
Pharmacy Antibiotic Note  Emily Kennedy is a 70 y.o. female admitted on 06/13/2015 with pneumonia.  Pharmacy has been consulted for cefepime and vancomycin dosing - day #6. Also on azithro.  VT 38 on 1g q12h. Will hold vanc and check VR in ~14 hours to assess clearance. SCr stable 0.78, CrCl~58, make urine.  Plan: Hold vanc with supratherapeutic VT Check VR at 0100 to assess clearance - restart at 1g IV q24h if in range Cefepime 2g IV q12h  Azithro 500mg  IV q24h Monitor culture data, renal function and clinical course Maybe can d/c vanc soon?  Height: 5\' 4"  (162.6 cm) Weight: 141 lb 12.1 oz (64.3 kg) IBW/kg (Calculated) : 54.7  Temp (24hrs), Avg:99.5 F (37.5 C), Min:98.9 F (37.2 C), Max:100.1 F (37.8 C)   Recent Labs Lab 06/13/15 2052 06/13/15 2126 06/14/15 0128 06/14/15 0353 06/15/15 0836 06/16/15 0705 06/18/15 0316 06/18/15 1103  WBC 4.0  --   --  4.5  --  2.5* 3.1*  --   CREATININE 0.73  --   --  0.77  0.73  --  0.83 0.78  --   LATICACIDVEN  --  0.74 0.71  --   --   --   --   --   VANCOTROUGH  --   --   --   --  13  --   --  38*    Estimated Creatinine Clearance: 58.1 mL/min (by C-G formula based on Cr of 0.78).    Allergies  Allergen Reactions  . Benadryl [Diphenhydramine] Swelling  . Ciprofloxacin Hives  . Furosemide Itching, Swelling, Rash and Other (See Comments)    Made edema worse  . Nitrofurantoin Monohyd Macro Hives  . Septra [Bactrim] Hives  . Sulfa Drugs Cross Reactors Hives  . Ceclor [Cefaclor] Rash    Breaks out in rash chest down.    Antimicrobials this admission: 3/31 cefepime >> 3/31 vanc >> 4/3 Fluconazole (thrush) >> 4/4 azithro>>  Dose adjustments this admission: 4/2: VT 13 ~8h post dose, increased to 1000 mg q12h from 750 mg q12h 4/5: VT 38 on 1g q12h  Microbiology results: 4/1 influenza A&B neg 4/1 H1N1 not detected 4/1 strep pneumo: positive 4/1 legionella: positive 3/31 blood >> 1/2 GPC chains= Diptheroids  (corynebacterium)  4/1 urine >>neg/F 4/1 MRSA PCR neg 4/4 blood Cx x2: pending     Elicia Lamp, PharmD, BCPS Clinical Pharmacist Pager [phone] 06/18/2015 1:16 PM

## 2015-06-18 NOTE — Progress Notes (Signed)
Inpatient Diabetes Program Recommendations  AACE/ADA: New Consensus Statement on Inpatient Glycemic Control (2015)  Target Ranges:  Prepandial:   less than 140 mg/dL      Peak postprandial:   less than 180 mg/dL (1-2 hours)      Critically ill patients:  140 - 180 mg/dL   Review of Glycemic ControlResults for [name], Emily Kennedy (MRN [id number]) as of 06/18/2015 09:06  Ref. Range 06/17/2015 08:10 06/17/2015 12:10 06/17/2015 18:02 06/17/2015 21:39 06/18/2015 08:43  Glucose-Capillary Latest Ref Range: 65-99 mg/dL 232 (H) 223 (H) 248 (H) 272 (H) 268 (H)    Diabetes history: Diabetes Mellitus Outpatient Diabetes medications: Metformin 1000 mg bid with meals Current orders for Inpatient glycemic control:  Novolog sensitive tid with meals and HS, Novolog 3 units tid with meals  Inpatient Diabetes Program Recommendations:  May consider adding Levemir 14 units daily while on steroids.  Thanks, Adah Perl, RN, BC-ADM Inpatient Diabetes Coordinator Pager [phone] (8a-5p)

## 2015-06-18 NOTE — Progress Notes (Addendum)
Patient ID: Emily Kennedy, female   DOB: 1945/11/25, 70 y.o.   MRN: [id number] [facility] PROGRESS NOTE  Emily Kennedy [id number] DOB: 02/03/1946 DOA: 06/13/2015 PCP: [name], MD  Brief narrative:    70 y.o. female with a past medical history significant for MGUS, HTN, NIDDM, and leukocytoclastic vasculitis who presented with altered mental status and cough, weakness and poor appetite for past 4 days PTA.  In the ED, pt was hypoxic requiring NRB, febrile to 100 2.22F, and tachycardic and tachypneic. Blood work showed sodium of 129, lactate normal. CXR showed a dense right diffuse opacity. Pt started on vanco and cefepime for HCAP treatment. Strep pneumonia and legionella are positive on this admission.   Hospital course complicated with development of atrial fibrillation with RVR requiring transfer to SDU 4/4. Cardio has seen her in consultation. She is on amiodarone an Cardizem drip.   Assessment/Plan:    Principal Problem:   Acute respiratory failure with hypoxia (HCC) / Sepsis secondary to HCAP (healthcare-associated pneumonia) secondary to streptococcus and legionella pneumonia / Leukopenia  - Sepsis criteria met on admission with fever, tachycardia, tachypnea, hypoxia  - Source of infection likely pneumonia as evidenced on chest x-ray on the admission, right-sided airspace opacities concerning for pneumonia - Streptococcus pneumonia antigen positive. Legionella antigen positive   - Influenza negative  - Continue current antibiotics: vanco and cefepime. Azithromycin added 4/4 once we had legionella results.   - She was started on solu cortef on admission so we are tapering steroids, currently 50 mg IV BID and will change it to once a day today  Active Problems:   Atrial fibrillation with rapid RVR - CHADS vasc score 4 - HR as high as 190's - Started Cardizem drip and cardio consulted - Cardio added amiodarone drip - 2 D Echo from 05/19/15 shows normal LVEF with  grade 2 diastolic dysfunction, elevated filling pressures and moderately dilated left atrium.  - The patient will require long term anticoagulation, will follow up with cardio on recommendations    Acute on chronic diastolic CHF - As mentioned, 2 D ECHO 05/19/2015 showed normal EF and grade 2 diastolic dysfunction - Started lasix 20 mg IV Q 12 hours     Bacteremia secondary to diphtheroid species, likely a contaminant  - One of the blood cultures on admission growing diphtheroid species and another culture shows no growth - This is likely a contaminant - We did however repeat blood cultures to make sure bacteremia resolved  - Continue current antibiotics    Thrush - Continue fluconazole    Acute toxic encephalopathy - Due to acute infection, sepsis - Once medically stable she will be going to skilled nursing facility per physical therapy evaluation and recommendation - Appreciate social work assisting discharge planning    Dysphagia - SLP - recommends dysphasia 1 diet, order placed    Hyponatremia - Likely dehydration and PNA - Sodium normalized with IV fluids    Diabetes mellitus type 2, uncontrolled (HCC) - Continue novolog 3 units TID along with SSI - CBG's in past 24 hours: 223, 248, 272    Anemia, chronic disease - Due to history of MGUS - Hemoglobin stable at 10.2, 9.7    Hypothyroidism - Continue synthroid    Essential hypertension - On Cardizem drip  - BP 118/67    DVT Prophylaxis  - Lovenox subQ ordered in hospital   Code Status: Full.  Family Communication:  plan of care discussed with the patient and daughter at the  bedside  Disposition Plan: to SNF once stable, she is now in SDU due to need for amiodarone and Cardizem drip   IV access:  Peripheral IV  Procedures and diagnostic studies:    Dg Chest 2 View 06/16/2015   1. Improved congestive heart failure, with residual interstitial edema and bilateral small to moderate pleural effusions with passive  atelectasis, but with resolution of most of the airspace edema previously seen. 2. Mild enlargement of the cardiopericardial silhouette. Electronically Signed   By: Van Clines M.D.   On: 06/16/2015 08:53   Dg Chest Port 1 View 06/17/2015  Appearance of the chest most consistent with congestive heart failure with associated pleural effusions. Aeration is worsened since yesterday's exam.   Medical Consultants:  None   IAnti-Infectives:   Vanco and cefepime 06/13/2015 --> Fluconazole 06/16/2015 -->   Lorea Kupfer, MD  [facility] Pager [phone]  Time spent in minutes: 25 minutes  If 7PM-7AM, please contact night-coverage [email or web address] Password [email or web address] 06/18/2015, 6:42 AM   LOS: 5 days    HPI/Subjective: Went in a fib yesterday afternoon requiring transfer to SDU.  Objective: Filed Vitals:   06/17/15 2049 06/18/15 0000 06/18/15 0300 06/18/15 0349  BP:  113/61  118/67  Pulse: 102 95  88  Temp:  100.1 F (37.8 C) 99.8 F (37.7 C) 99.8 F (37.7 C)  TempSrc:  Axillary Axillary Axillary  Resp: 24 29  25   Height:      Weight:      SpO2: 99% 99%  100%    Intake/Output Summary (Last 24 hours) at 06/18/15 5456 Last data filed at 06/18/15 0600  Gross per 24 hour  Intake 2197.59 ml  Output      0 ml  Net 2197.59 ml    Exam:   General:  Pt is alert, on ventimask   Cardiovascular: rate controlled, appreciate S1, S2   Respiratory: no wheezing, no rhonchi   Abdomen: (+) BS, non tender, non distended    Extremities: palpable pulses, trace LE edema   Neuro: No focal deficits   Data Reviewed: Basic Metabolic Panel:  Recent Labs Lab 06/13/15 2052 06/14/15 0353 06/16/15 0705 06/18/15 0316  NA 129* 129* 136 135  K 3.8 3.6 3.8 3.5  CL 98* 101 108 106  CO2 19* 18* 18* 18*  GLUCOSE 167* 177* 213* 278*  BUN 17 19 27* 28*  CREATININE 0.73 0.77  0.73 0.83 0.78  CALCIUM 8.9 8.3* 8.4* 8.5*   Liver Function Tests:  Recent Labs Lab 06/13/15 2052  06/14/15 0353  AST 43* 50*  ALT 21 20  ALKPHOS 42 37*  BILITOT 1.3* 1.0  PROT 6.2* 5.9*  ALBUMIN 2.6* 2.3*   CBC:  Recent Labs Lab 06/13/15 2052 06/14/15 0353 06/16/15 0705 06/18/15 0316  WBC 4.0 4.5 2.5* 3.1*  NEUTROABS 2.5  --   --   --   HGB 10.0* 10.2* 10.2* 9.7*  HCT 28.7* 29.9* 29.3* 28.8*  MCV 89.4 85.4 89.1 88.6  PLT 178 145* 146* 129*   BNP: Invalid input(s): POCBNP   CBG:  Recent Labs Lab 06/16/15 2146 06/17/15 0810 06/17/15 1210 06/17/15 1802 06/17/15 2139  GLUCAP 242* 232* 223* 248* 272*    Recent Results (from the past 240 hour(s))  Blood Culture (routine x 2)     Status: None (Preliminary result)   Collection Time: 06/13/15  8:52 PM  Result Value Ref Range Status   Specimen Description BLOOD LEFT HAND  Final   Special Requests BOTTLES DRAWN  AEROBIC AND ANAEROBIC 5CC  Final   Culture  Setup Time   Final    GRAM POSITIVE COCCI IN CHAINS ANAEROBIC BOTTLE ONLY CRITICAL RESULT CALLED TO, READ BACK BY AND VERIFIED WITH: S DOTY,RN AT 1330 06/15/15 BY L BENFIELD GRAM POSITIVE RODS AEROBIC BOTTLE ONLY CORRECTED RESULTS CALLED TO: C HANCOCK,RN AT 1046 06/17/15 BY L BENFIELD    Culture   Final    GRAM POSITIVE COCCI CULTURE REINCUBATED FOR BETTER GROWTH DIPHTHEROIDS(CORYNEBACTERIUM SPECIES) Standardized susceptibility testing for this organism is not available.    Report Status PENDING  Incomplete  Blood Culture (routine x 2)     Status: None (Preliminary result)   Collection Time: 06/13/15  9:04 PM  Result Value Ref Range Status   Specimen Description BLOOD RIGHT WRIST  Final   Special Requests BOTTLES DRAWN AEROBIC AND ANAEROBIC 5CC  Final   Culture NO GROWTH 4 DAYS  Final   Report Status PENDING  Incomplete  Urine culture     Status: None   Collection Time: 06/14/15 10:00 AM  Result Value Ref Range Status   Specimen Description URINE, CATHETERIZED  Final   Special Requests NONE  Final   Culture NO GROWTH 1 DAY  Final   Report Status  06/15/2015 FINAL  Final  MRSA PCR Screening     Status: None   Collection Time: 06/14/15  2:15 PM  Result Value Ref Range Status   MRSA by PCR NEGATIVE NEGATIVE Final    Comment:        The GeneXpert MRSA Assay (FDA approved for NASAL specimens only), is one component of a comprehensive MRSA colonization surveillance program. It is not intended to diagnose MRSA infection nor to guide or monitor treatment for MRSA infections.   Culture, blood (routine x 2)     Status: None (Preliminary result)   Collection Time: 06/17/15 12:58 PM  Result Value Ref Range Status   Specimen Description BLOOD RIGHT ANTECUBITAL  Final   Special Requests IN PEDIATRIC BOTTLE  3CC  Final   Culture PENDING  Incomplete   Report Status PENDING  Incomplete  MRSA PCR Screening     Status: None   Collection Time: 06/17/15  6:23 PM  Result Value Ref Range Status   MRSA by PCR NEGATIVE NEGATIVE Final    Comment:        The GeneXpert MRSA Assay (FDA approved for NASAL specimens only), is one component of a comprehensive MRSA colonization surveillance program. It is not intended to diagnose MRSA infection nor to guide or monitor treatment for MRSA infections.      Scheduled Meds: . antiseptic oral rinse  7 mL Mouth Rinse q12n4p  . azithromycin  500 mg Intravenous Q24H  . budesonide (PULMICORT) nebulizer solution  0.25 mg Nebulization BID  . ceFEPime (MAXIPIME) IV  2 g Intravenous Q12H  . chlorhexidine  15 mL Mouth Rinse BID  . diltiazem  10 mg Intravenous Once  . enoxaparin (LOVENOX) injection  40 mg Subcutaneous Q24H  . fluconazole (DIFLUCAN) IV  200 mg Intravenous Q24H  . furosemide  20 mg Intravenous BID  . hydrocortisone sod succinate (SOLU-CORTEF) inj  50 mg Intravenous BID  . insulin aspart  0-5 Units Subcutaneous QHS  . insulin aspart  0-9 Units Subcutaneous TID WC  . insulin aspart  3 Units Subcutaneous TID WC  . levothyroxine  75 mcg Intravenous Daily  . sodium chloride flush  3 mL  Intravenous Q12H  . vancomycin  1,000 mg Intravenous Q12H  Continuous Infusions: . sodium chloride 50 mL/hr at 06/16/15 0600  . amiodarone 30 mg/hr (06/18/15 0024)  . diltiazem (CARDIZEM) infusion 15 mg/hr (06/18/15 0544)

## 2015-06-18 NOTE — Progress Notes (Signed)
Patient Name: Emily Kennedy Date of Encounter: 06/18/2015  Principal Problem:   Acute respiratory failure with hypoxia ([place]) Active Problems:   MGUS (monoclonal gammopathy of unknown significance)   Hyponatremia   Diabetes mellitus type 2, uncontrolled (HCC)   Anemia, chronic disease   Hypothyroidism   Essential hypertension   HCAP (healthcare-associated pneumonia)   Streptococcal pneumonia ([place])   Acute encephalopathy   Legionella pneumonia ([place])   Atrial fibrillation with RVR ([place])   Length of Stay: 5  SUBJECTIVE  The patient is non-verbal, doesn't appear in distress.  CURRENT MEDS . antiseptic oral rinse  7 mL Mouth Rinse q12n4p  . azithromycin  500 mg Intravenous Q24H  . budesonide (PULMICORT) nebulizer solution  0.25 mg Nebulization BID  . ceFEPime (MAXIPIME) IV  2 g Intravenous Q12H  . chlorhexidine  15 mL Mouth Rinse BID  . diltiazem  10 mg Intravenous Once  . enoxaparin (LOVENOX) injection  40 mg Subcutaneous Q24H  . fluconazole (DIFLUCAN) IV  200 mg Intravenous Q24H  . furosemide  20 mg Intravenous BID  . hydrocortisone sod succinate (SOLU-CORTEF) inj  50 mg Intravenous BID  . insulin aspart  0-5 Units Subcutaneous QHS  . insulin aspart  0-9 Units Subcutaneous TID WC  . insulin aspart  3 Units Subcutaneous TID WC  . levothyroxine  75 mcg Intravenous Daily  . sodium chloride flush  3 mL Intravenous Q12H  . vancomycin  1,000 mg Intravenous Q12H   . sodium chloride 50 mL/hr at 06/16/15 0600  . amiodarone 30 mg/hr (06/18/15 0024)  . diltiazem (CARDIZEM) infusion 15 mg/hr (06/18/15 0544)   OBJECTIVE  Filed Vitals:   06/17/15 2049 06/18/15 0000 06/18/15 0300 06/18/15 0349  BP:  113/61  118/67  Pulse: 102 95  88  Temp:  100.1 F (37.8 C) 99.8 F (37.7 C) 99.8 F (37.7 C)  TempSrc:  Axillary Axillary Axillary  Resp: 24 29  25   Height:      Weight:      SpO2: 99% 99%  100%    Intake/Output Summary (Last 24 hours) at 06/18/15 0838 Last data filed  at 06/18/15 0700  Gross per 24 hour  Intake 2247.59 ml  Output      0 ml  Net 2247.59 ml   Filed Weights   06/14/15 1430  Weight: 141 lb 12.1 oz (64.3 kg)   PHYSICAL EXAM  General: no distress. Neuro: Moves all extremities spontaneously. Psych: appears withdrawn. HEENT:  Normal  Neck: Supple without bruits or JVD. Lungs:  Resp regular and unlabored, CTA. Heart: RRR no s3, s4, or murmurs. Abdomen: Soft, non-tender, non-distended, BS + x 4.  Extremities: No clubbing, cyanosis or edema. DP/PT/Radials 2+ and equal bilaterally.  Accessory Clinical Findings  CBC  Recent Labs  06/16/15 0705 06/18/15 0316  WBC 2.5* 3.1*  HGB 10.2* 9.7*  HCT 29.3* 28.8*  MCV 89.1 88.6  PLT 146* Q000111Q*   Basic Metabolic Panel  Recent Labs  06/16/15 0705 06/18/15 0316  NA 136 135  K 3.8 3.5  CL 108 106  CO2 18* 18*  GLUCOSE 213* 278*  BUN 27* 28*  CREATININE 0.83 0.78  CALCIUM 8.4* 8.5*   Radiology/Studies  Dg Chest Port 1 View  06/17/2015  CLINICAL DATA:  Acute onset tachycardia today.  IMPRESSION: Appearance of the chest most consistent with congestive heart failure with associated pleural effusions. Aeration is worsened since yesterday's exam. Electronically Signed   By: Inge Rise M.D.   On: 06/17/2015  18:31   TELE: cardioverted to SR at 11 pm yesterday    ASSESSMENT AND PLAN  Principal Problem:  Atrial fib, rapid ventricular response Acute respiratory failure with hypoxia (HCC) Active Problems:  MGUS (monoclonal gammopathy of unknown significance)  Hyponatremia  Diabetes mellitus type 2, uncontrolled (HCC)  Anemia, chronic disease  Hypothyroidism  Essential hypertension  HCAP (healthcare-associated pneumonia)  Streptococcal pneumonia ([place])  Acute encephalopathy  70 year old female with MGUS, HTN, NIDDM, and leukocytoclastic vasculitis admitted on 06/13/2015 with HCAP with sepsis.  She went into a-fib with this RVR around 3 pm yesterday, I have  personally reviewed her telemetry strips and has been in sinus tachycardia with very frequent PACs and short runs of a-fib all day, until she went into a-fib at 3 pm and still having short intermittent episodes of SR in between. The fastest ventricular rate 190 BPM with a-fib. She was in acute respiratory distress on BiPAP, now off and comfortable.  She spontaneously cardioverted to SR at 11 pm yesterday, I would discontinue iv cardizem and start PO cardizem CD 120 mg daily, finish amiodarone drip, then switch to amiodarone 200 mg po daily. Hopefully this is an isolated episode and we can continue anticoagulation for only short term.  CHADS-VASc 4, platelets are rather low, stable today.  Echo from 05/19/15 shows normal LVEF with grade 2 diastolic dysfunction, elevated filling pressures and moderately dilated left atrium.   Continue lasix 20 mg iv BID today and switch to 20 mg po daily tomorrow.  Signed, Dorothy Spark MD, [facility] 06/18/2015

## 2015-06-18 NOTE — Progress Notes (Signed)
Physical Therapy Treatment Patient Details Name: Emily Kennedy MRN: [id number] DOB: [birth year] Today's Date: 06/18/2015    History of Present Illness 70 y.o. female with h/o HTN, NIDDM, MGUS, recent hospitalization in January 2017 for LLE vasculitis admitted with HCAP, sepsis, encephalopathy, weakness.     PT Comments    Patient progressing some with sitting balance this session.  Was unable to stand or even pivot to [facility] due to not supporting weight in LE's.  Still non-communicative, but definitely exerting her preference to lie down when she could. Continue to recommend SNF level rehab at d/c.  Follow Up Recommendations  SNF;Supervision/Assistance - 24 hour     Equipment Recommendations  None recommended by PT    Recommendations for Other Services       Precautions / Restrictions Precautions Precautions: Fall Precaution Comments: recently slid out of bed at home    Mobility  Bed Mobility Overal bed mobility: Needs Assistance Bed Mobility: Rolling;Sit to Supine Rolling: Max assist   Supine to sit: Max assist;+2 for physical assistance Sit to supine: +2 for physical assistance;Max assist   General bed mobility comments: rolled for hygiene due to incontinent of urine.  Needed assist for turning hips and reaching rail; to sit and to supine assist for legs and trunk.  Transfers Overall transfer level: Needs assistance   Transfers: Sit to/from Stand Sit to Stand: +2 physical assistance;Total assist         General transfer comment: attempted to stand to get onto [facility], [facility], [facility], but pt not accepting weight in LE's, so worked on sitting balance, then returned to supine  Ambulation/Gait                 Stairs            Wheelchair Mobility    Modified Rankin (Stroke Patients Only)       Balance Overall balance assessment: Needs assistance Sitting-balance support: Feet supported Sitting balance-Leahy Scale: Zero Sitting balance - Comments: sat EOB about 8  minutes, at times minguard for balance, at times pushing into extension with max support for balance Postural control: Posterior lean     Standing balance comment: unable to stand                    Cognition Arousal/Alertness: Awake/alert Behavior During Therapy: Flat affect Overall Cognitive Status: Impaired/Different from baseline Area of Impairment: Following commands;Safety/judgement;Problem solving;Attention;Orientation   Current Attention Level: Sustained   Following Commands: Follows one step commands inconsistently Safety/Judgement: Decreased awareness of safety   Problem Solving: Slow processing;Requires tactile cues;Requires verbal cues;Difficulty sequencing      Exercises      General Comments General comments (skin integrity, edema, etc.): soiled with urine, but no skin breakdown noted      Pertinent Vitals/Pain Faces Pain Scale: Hurts little more Pain Location: LE's with movement Pain Descriptors / Indicators: Sore Pain Intervention(s): Limited activity within patient's tolerance;Monitored during session;Repositioned    Home Living                      Prior Function            PT Goals (current goals can now be found in the care plan section) Progress towards PT goals: Progressing toward goals    Frequency  Min 3X/week    PT Plan Current plan remains appropriate    Co-evaluation             End of Session Equipment Utilized During Treatment:  Gait belt Activity Tolerance: Patient limited by fatigue Patient left: in bed;with call bell/phone within reach;with bed alarm set;with family/visitor present     Time: 1137-1201 PT Time Calculation (min) (ACUTE ONLY): 24 min  Charges:  $Therapeutic Activity: 23-37 mins                    G Codes:      Reginia Naas 2015-07-03, 1:13 PM  Magda Kiel, [place] 2015-07-03

## 2015-06-19 ENCOUNTER — Inpatient Hospital Stay (HOSPITAL_COMMUNITY): Payer: Medicare Other

## 2015-06-19 DIAGNOSIS — E871 Hypo-osmolality and hyponatremia: Secondary | ICD-10-CM

## 2015-06-19 DIAGNOSIS — R1310 Dysphagia, unspecified: Secondary | ICD-10-CM | POA: Diagnosis present

## 2015-06-19 DIAGNOSIS — B370 Candidal stomatitis: Secondary | ICD-10-CM

## 2015-06-19 DIAGNOSIS — A481 Legionnaires' disease: Secondary | ICD-10-CM

## 2015-06-19 DIAGNOSIS — R4182 Altered mental status, unspecified: Secondary | ICD-10-CM | POA: Diagnosis present

## 2015-06-19 LAB — CBC
HEMATOCRIT: 24.2 % — AB (ref 36.0–46.0)
Hemoglobin: 8.7 g/dL — ABNORMAL LOW (ref 12.0–15.0)
MCH: 33 pg (ref 26.0–34.0)
MCHC: 36 g/dL (ref 30.0–36.0)
MCV: 91.7 fL (ref 78.0–100.0)
PLATELETS: 82 10*3/uL — AB (ref 150–400)
RBC: 2.64 MIL/uL — AB (ref 3.87–5.11)
RDW: 18.4 % — AB (ref 11.5–15.5)
WBC: 2.9 10*3/uL — AB (ref 4.0–10.5)

## 2015-06-19 LAB — BASIC METABOLIC PANEL
Anion gap: 12 (ref 5–15)
BUN: 29 mg/dL — AB (ref 6–20)
CHLORIDE: 104 mmol/L (ref 101–111)
CO2: 19 mmol/L — ABNORMAL LOW (ref 22–32)
CREATININE: 1.12 mg/dL — AB (ref 0.44–1.00)
Calcium: 8.5 mg/dL — ABNORMAL LOW (ref 8.9–10.3)
GFR, EST AFRICAN AMERICAN: 57 mL/min — AB (ref >60)
GFR, EST NON AFRICAN AMERICAN: 49 mL/min — AB (ref >60)
Glucose, Bld: 242 mg/dL — ABNORMAL HIGH (ref 65–99)
POTASSIUM: 3.1 mmol/L — AB (ref 3.5–5.1)
SODIUM: 135 mmol/L (ref 135–145)

## 2015-06-19 LAB — CULTURE, BLOOD (ROUTINE X 2)

## 2015-06-19 LAB — LIPID PANEL
CHOLESTEROL: 164 mg/dL (ref 0–200)
HDL: 26 mg/dL — AB (ref >40)
LDL Cholesterol: 94 mg/dL (ref 0–99)
TRIGLYCERIDES: 221 mg/dL — AB (ref <150)
Total CHOL/HDL Ratio: 6.3 RATIO
VLDL: 44 mg/dL — ABNORMAL HIGH (ref 0–40)

## 2015-06-19 LAB — GLUCOSE, CAPILLARY
GLUCOSE-CAPILLARY: 240 mg/dL — AB (ref 65–99)
Glucose-Capillary: 230 mg/dL — ABNORMAL HIGH (ref 65–99)
Glucose-Capillary: 214 mg/dL — ABNORMAL HIGH (ref 65–99)
Glucose-Capillary: 180 mg/dL — ABNORMAL HIGH (ref 65–99)
Glucose-Capillary: 187 mg/dL — ABNORMAL HIGH (ref 65–99)

## 2015-06-19 LAB — VANCOMYCIN, RANDOM: VANCOMYCIN RM: 27 ug/mL

## 2015-06-19 LAB — MAGNESIUM: MAGNESIUM: 1.8 mg/dL (ref 1.7–2.4)

## 2015-06-19 MED ORDER — INSULIN ASPART 100 UNIT/ML ~~LOC~~ SOLN
0.0000 [IU] | SUBCUTANEOUS | Status: DC
  Administered 2015-06-19: 5 [IU] via SUBCUTANEOUS
  Administered 2015-06-19 (×2): 3 [IU] via SUBCUTANEOUS
  Administered 2015-06-19: 5 [IU] via SUBCUTANEOUS
  Administered 2015-06-20: 2 [IU] via SUBCUTANEOUS
  Administered 2015-06-20 (×3): 3 [IU] via SUBCUTANEOUS
  Administered 2015-06-20: 2 [IU] via SUBCUTANEOUS
  Administered 2015-06-20: 3 [IU] via SUBCUTANEOUS
  Administered 2015-06-21: 8 [IU] via SUBCUTANEOUS
  Administered 2015-06-21 (×2): 5 [IU] via SUBCUTANEOUS
  Administered 2015-06-21: 8 [IU] via SUBCUTANEOUS

## 2015-06-19 MED ORDER — DEXTROSE 5 % IV SOLN
2.0000 g | INTRAVENOUS | Status: DC
  Administered 2015-06-19 – 2015-06-21 (×3): 2 g via INTRAVENOUS
  Filled 2015-06-19 (×4): qty 2

## 2015-06-19 MED ORDER — IPRATROPIUM-ALBUTEROL 0.5-2.5 (3) MG/3ML IN SOLN
3.0000 mL | Freq: Two times a day (BID) | RESPIRATORY_TRACT | Status: DC
Start: 2015-06-19 — End: 2015-06-19

## 2015-06-19 MED ORDER — FUROSEMIDE 10 MG/ML IJ SOLN: 60.0000 mg | Freq: Two times a day (BID) | INTRAMUSCULAR | Status: DC

## 2015-06-19 MED ORDER — POTASSIUM CHLORIDE CRYS ER 20 MEQ PO TBCR: 50.0000 meq | EXTENDED_RELEASE_TABLET | Freq: Once | ORAL | Status: DC

## 2015-06-19 MED ORDER — POTASSIUM CHLORIDE 10 MEQ/100ML IV SOLN
10.0000 meq | Freq: Once | INTRAVENOUS | Status: AC
  Administered 2015-06-19: 10 meq via INTRAVENOUS

## 2015-06-19 MED ORDER — DILTIAZEM HCL ER COATED BEADS 180 MG PO CP24
180.0000 mg | ORAL_CAPSULE | Freq: Every day | ORAL | Status: DC
  Administered 2015-06-20 – 2015-06-22 (×3): 180 mg via ORAL
  Filled 2015-06-19 (×3): qty 1

## 2015-06-19 MED ORDER — FUROSEMIDE 40 MG PO TABS
40.0000 mg | ORAL_TABLET | Freq: Two times a day (BID) | ORAL | Status: DC
  Administered 2015-06-20 – 2015-06-21 (×2): 40 mg via ORAL
  Filled 2015-06-19 (×2): qty 1

## 2015-06-19 MED ORDER — INSULIN GLARGINE 100 UNIT/ML ~~LOC~~ SOLN
8.0000 [IU] | Freq: Every day | SUBCUTANEOUS | Status: DC
  Administered 2015-06-19 – 2015-06-20 (×2): 8 [IU] via SUBCUTANEOUS
  Filled 2015-06-19 (×3): qty 0.08

## 2015-06-19 MED ORDER — VANCOMYCIN HCL IN DEXTROSE 1-5 GM/200ML-% IV SOLN: 1000.0000 mg | INTRAVENOUS | Status: DC

## 2015-06-19 MED ORDER — POTASSIUM CHLORIDE 10 MEQ/100ML IV SOLN
10.0000 meq | INTRAVENOUS | Status: AC
  Administered 2015-06-19 (×4): 10 meq via INTRAVENOUS
  Filled 2015-06-19 (×5): qty 100

## 2015-06-19 NOTE — Progress Notes (Signed)
SLP Cancellation Note  Patient Details Name: Emily Kennedy MRN: [id number] DOB: Mar 21, 1945   Cancelled treatment:       Reason Eval/Treat Not Completed: Patient's level of consciousness; pt now NPO.  Will f/u next date for improvements/PO readiness.    Juan Quam Laurice 06/19/2015, 11:45 AM

## 2015-06-19 NOTE — Progress Notes (Signed)
Pharmacy Antibiotic Note  Emily Kennedy is a 70 y.o. female admitted on 06/13/2015 with pneumonia.  Pharmacy has been consulted for cefepime and vancomycin dosing - day #7. Also on azithromycin.  Vanc random level remains elevated 27 mcg/ml (14.5 hrs post trough of 38 mcg/ml) Ke = 0.024 Half life ~29 hours  Noted that SCr trending up to 1.12 with est CrCl 40 ml/min.  Plan: Will restart vancomycin 4/7 0800 (~30 hrs from now) with dose of 1gm IV q36h F/u renal function, if SCr continues upward trend, may consider random level 4/7  Change Cefepime to 2g IV q24h  Azithro 500mg  IV q24h Monitor culture data, renal function and clinical course ?d/c vanc soon  Height: 5\' 4"  (162.6 cm) Weight: 141 lb 12.1 oz (64.3 kg) IBW/kg (Calculated) : 54.7  Temp (24hrs), Avg:99.2 F (37.3 C), Min:97.7 F (36.5 C), Max:99.8 F (37.7 C)   Recent Labs Lab 06/13/15 2052 06/13/15 2126 06/14/15 0128 06/14/15 0353 06/15/15 0836 06/16/15 0705 06/18/15 0316 06/18/15 1103 06/19/15 0128  WBC 4.0  --   --  4.5  --  2.5* 3.1*  --  2.9*  CREATININE 0.73  --   --  0.77  0.73  --  0.83 0.78  --  1.12*  LATICACIDVEN  --  0.74 0.71  --   --   --   --   --   --   VANCOTROUGH  --   --   --   --  13  --   --  46*  --   VANCORANDOM  --   --   --   --   --   --   --   --  27    Estimated Creatinine Clearance: 41.5 mL/min (by C-G formula based on Cr of 1.12).    Allergies  Allergen Reactions  . Benadryl [Diphenhydramine] Swelling  . Ciprofloxacin Hives  . Furosemide Itching, Swelling, Rash and Other (See Comments)    Made edema worse  . Nitrofurantoin Monohyd Macro Hives  . Septra [Bactrim] Hives  . Sulfa Drugs Cross Reactors Hives  . Ceclor [Cefaclor] Rash    Breaks out in rash chest down.    Antimicrobials this admission: 3/31 cefepime >> 3/31 vanc >> 4/3 Fluconazole (thrush) >> 4/4 azithro>>  Dose adjustments this admission: 4/2: VT 13 ~8h post dose, increased to 1000 mg q12h from 750 mg  q12h 4/5: VT 38 on 1g q12h  Microbiology results: 4/1 influenza A&B neg 4/1 H1N1 not detected 4/1 strep pneumo: positive 4/1 legionella: positive 3/31 blood >> 1/2 GPC chains= Diptheroids (corynebacterium)  4/1 urine >>neg/F 4/1 MRSA PCR neg 4/4 blood Cx x2: pending     Elicia Lamp, PharmD, BCPS Clinical Pharmacist Pager [phone] 06/19/2015 2:33 AM

## 2015-06-19 NOTE — Progress Notes (Signed)
[place] TEAM 1 - Stepdown/ICU TEAM Progress Note  Emily Kennedy MGQ:[id number] DOB: 20-Mar-1945 DOA: 06/13/2015 PCP: [name], MD  Admit HPI / Brief Narrative: 70 y.o. WF PMHx MGUS, HTN, Chronic Diastolic CHF, DM Type 2, Leukocytoclastic Vasculitis   Who presented with altered mental status and cough, weakness and poor appetite for past 4 days PTA.  In the ED, pt was hypoxic requiring NRB, febrile to 100 2.15F, and tachycardic and tachypneic. Blood work showed sodium of 129, lactate normal. CXR showed a dense right diffuse opacity. Pt started on vanco and cefepime for HCAP treatment. Strep pneumonia and legionella are positive on this admission.   Hospital course complicated with development of atrial fibrillation with RVR requiring transfer to SDU 4/4. Cardio has seen her in consultation. She is on amiodarone an Cardizem drip.   HPI/Subjective: 4/6  eyes open, A/O 0. Per her son Emily Kennedy patient prior to this illness was A/O 4. Patient lives with her daughter and Emily Kennedy and his other brother also help with care of patient. Afebrile overnight.   Assessment/Plan: Acute respiratory failure with hypoxia (HCC) / Sepsis secondary to HCAP (healthcare-associated pneumonia) secondary to streptococcus and legionella pneumonia / Leukopenia  - Sepsis criteria met on admission with fever, tachycardia, tachypnea, hypoxia  - Source of infection likely pneumonia as evidenced on chest x-ray on the admission, right-sided airspace opacities concerning for pneumonia - Streptococcus pneumonia antigen positive. Legionella antigen positive  - Influenza negative  - Continue current antibiotics: vanco and cefepime. Azithromycin added 4/4 once we had legionella results.  - Tapering steroids, currently 50 mg IV BID  -DuoNeb twice a day -Albuterol PRN   Atrial fibrillation with rapid RVR(CHADS vasc score 4) - HR as high as 190's - Continue Cardizem drip and cardio consulted - Cardio added  amiodarone drip - 2 D Echo from 05/19/15 shows normal LVEF with grade 2 diastolic dysfunction, elevated filling pressures and moderately dilated left atrium.  - The patient will require long term anticoagulation, will follow up with cardio on recommendations   Acute on Chronic Diastolic CHF - As mentioned, 2 D ECHO 05/19/2015 showed normal EF and grade 2 diastolic dysfunction - Increase lasix 60 mg IV BID -Strict in and out; since admission +8.8 L  -Daily weight Filed Weights   06/14/15 1430 06/19/15 0514  Weight: 64.3 kg (141 lb 12.1 oz) 66.5 kg (146 lb 9.7 oz)  -Transfuse for hemoglobin<8  Essential hypertension - On Cardizem drip  - BP 118/67   Bacteremia secondary to diphtheroid species, likely a contaminant  - One of the blood cultures on admission growing diphtheroid species and another culture shows no growth - This is likely a contaminant - We did however repeat blood cultures to make sure bacteremia resolved  - Continue current antibiotics   Thrush - Continue fluconazole   Acute toxic encephalopathy/altered mental status - Possibly Due to acute infection, sepsis, however patient should be getting better not worse. CVA? Will obtain head CT - Once medically stable she will be going to skilled nursing facility per physical therapy evaluation and recommendation -Negative acute infarct by CT - Place CorTrak tube   Dysphagia - RN staff and son noted patient appeared to be aspirating.  -Changed to NPO. -Swallow study when patient can follow commands    Hyponatremia - Resolved with IV fluids - Hypokalemia -Potassium goal>4 -Potassium IV  50 mEq   Diabetes mellitus type 2, uncontrolled (HCC) -Hemoglobin A1c pending -Lipid panel pending -Lantus 8 units daily - Continue novolog  3 units QAC  -Increase to moderate SSI   Anemia, chronic disease - Due to history of MGUS - Hemoglobin stable at 10.2, 9.7   Hypothyroidism -4/5 TSH= 2.8 - Continue  synthroid      Code Status: FULL Family Communication: Emily Kennedy (son) present at time of exam Disposition Plan: Resolution sepsis/altered mental status    Consultants:   Procedure/Significant Events: 3/6 echocardiogram;- Left ventricle: Mild LVH.- LVEF=  60% to 65%. -(grade 2 diastolic dysfunction).- Left atrium: moderately dilated. 4/4 PCXR;Appearance of the chest most consistent with CHF with associated pleural effusions.  -Aeration is worsened 4/6 CT head WO contrast; negative for acute infarct  Culture   Antibiotics: Vanco 06/13/2015>>4/6 Cefepime 06/13/2015 >> Fluconazole 06/16/2015 >> Azithromycin 4/4>>    DVT prophylaxis: Lovenox   Devices    LINES / TUBES:  NA    Continuous Infusions: . sodium chloride 50 mL/hr at 06/18/15 1532  . amiodarone 30 mg/hr (06/19/15 0825)    Objective: VITAL SIGNS: Temp: 99.1 F (37.3 C) (04/06 1155) Temp Source: Axillary (04/06 1155) BP: 142/118 mmHg (04/06 1155) Pulse Rate: 99 (04/06 1155) SPO2; FIO2:   Intake/Output Summary (Last 24 hours) at 06/19/15 1325 Last data filed at 06/19/15 1003  Gross per 24 hour  Intake 1286.7 ml  Output      0 ml  Net 1286.7 ml     Exam: General: Eyes open, A/O 0, positive  acute respiratory distress Eyes: Negative headache, negative scleral hemorrhage ENT: Negative Runny nose, negative gingival bleeding, Neck:  Negative scars, masses, torticollis, lymphadenopathy, JVD Lungs: Clear to auscultation except for bilateral lower lobe rhonchi, negative wheezes or crackles Cardiovascular: Regular rate and rhythm without murmur gallop or rub normal S1 and S2 Abdomen:negative abdominal pain, nondistended, positive soft, bowel sounds, no rebound, no ascites, no appreciable mass Extremities: No significant cyanosis, clubbing, or edema bilateral lower extremities Psychiatric:  Negative depression, negative anxiety, negative fatigue, negative mania  Neurologic:  Patient's eyes open will  turn toward you, follows no commands therefore unable to fully assess. Withdraws to painful stimuli     Data Reviewed: Basic Metabolic Panel:  Recent Labs Lab 06/13/15 2052 06/14/15 0353 06/16/15 0705 06/18/15 0316 06/19/15 0128 06/19/15 0845  NA 129* 129* 136 135 135  --   K 3.8 3.6 3.8 3.5 3.1*  --   CL 98* 101 108 106 104  --   CO2 19* 18* 18* 18* 19*  --   GLUCOSE 167* 177* 213* 278* 242*  --   BUN 17 19 27* 28* 29*  --   CREATININE 0.73 0.77  0.73 0.83 0.78 1.12*  --   CALCIUM 8.9 8.3* 8.4* 8.5* 8.5*  --   MG  --   --   --   --   --  1.8   Liver Function Tests:  Recent Labs Lab 06/13/15 2052 06/14/15 0353  AST 43* 50*  ALT 21 20  ALKPHOS 42 37*  BILITOT 1.3* 1.0  PROT 6.2* 5.9*  ALBUMIN 2.6* 2.3*   No results for input(s): LIPASE, AMYLASE in the last 168 hours. No results for input(s): AMMONIA in the last 168 hours. CBC:  Recent Labs Lab 06/13/15 2052 06/14/15 0353 06/16/15 0705 06/18/15 0316 06/19/15 0128  WBC 4.0 4.5 2.5* 3.1* 2.9*  NEUTROABS 2.5  --   --   --   --   HGB 10.0* 10.2* 10.2* 9.7* 8.7*  HCT 28.7* 29.9* 29.3* 28.8* 24.2*  MCV 89.4 85.4 89.1 88.6 91.7  PLT 178 145*  146* 129* 82*   Cardiac Enzymes: No results for input(s): CKTOTAL, CKMB, CKMBINDEX, TROPONINI in the last 168 hours. BNP (last 3 results)  Recent Labs  05/17/15 1944  BNP 78.4    ProBNP (last 3 results) No results for input(s): PROBNP in the last 8760 hours.  CBG:  Recent Labs Lab 06/18/15 1223 06/18/15 1709 06/19/15 0052 06/19/15 0755 06/19/15 1149  GLUCAP 238* 222* 230* 240* 214*    Recent Results (from the past 240 hour(s))  Blood Culture (routine x 2)     Status: None   Collection Time: 06/13/15  8:52 PM  Result Value Ref Range Status   Specimen Description BLOOD LEFT HAND  Final   Special Requests BOTTLES DRAWN AEROBIC AND ANAEROBIC 5CC  Final   Culture  Setup Time   Final    GRAM POSITIVE COCCI IN CHAINS ANAEROBIC BOTTLE ONLY CRITICAL RESULT  CALLED TO, READ BACK BY AND VERIFIED WITH: S DOTY,RN AT 1330 06/15/15 BY L BENFIELD GRAM POSITIVE RODS AEROBIC BOTTLE ONLY CORRECTED RESULTS CALLED TO: C HANCOCK,RN AT 1046 06/17/15 BY L BENFIELD    Culture   Final    VIRIDANS STREPTOCOCCUS THE SIGNIFICANCE OF ISOLATING THIS ORGANISM FROM A SINGLE SET OF BLOOD CULTURES WHEN MULTIPLE SETS ARE DRAWN IS UNCERTAIN. PLEASE NOTIFY THE MICROBIOLOGY DEPARTMENT WITHIN ONE WEEK IF SPECIATION AND SENSITIVITIES ARE REQUIRED. DIPHTHEROIDS(CORYNEBACTERIUM SPECIES) Standardized susceptibility testing for this organism is not available.    Report Status 06/19/2015 FINAL  Final  Blood Culture (routine x 2)     Status: None   Collection Time: 06/13/15  9:04 PM  Result Value Ref Range Status   Specimen Description BLOOD RIGHT WRIST  Final   Special Requests BOTTLES DRAWN AEROBIC AND ANAEROBIC 5CC  Final   Culture NO GROWTH 5 DAYS  Final   Report Status 06/18/2015 FINAL  Final  Urine culture     Status: None   Collection Time: 06/14/15 10:00 AM  Result Value Ref Range Status   Specimen Description URINE, CATHETERIZED  Final   Special Requests NONE  Final   Culture NO GROWTH 1 DAY  Final   Report Status 06/15/2015 FINAL  Final  MRSA PCR Screening     Status: None   Collection Time: 06/14/15  2:15 PM  Result Value Ref Range Status   MRSA by PCR NEGATIVE NEGATIVE Final    Comment:        The GeneXpert MRSA Assay (FDA approved for NASAL specimens only), is one component of a comprehensive MRSA colonization surveillance program. It is not intended to diagnose MRSA infection nor to guide or monitor treatment for MRSA infections.   Culture, blood (routine x 2)     Status: None (Preliminary result)   Collection Time: 06/17/15 12:58 PM  Result Value Ref Range Status   Specimen Description BLOOD RIGHT ANTECUBITAL  Final   Special Requests IN PEDIATRIC BOTTLE  3CC  Final   Culture NO GROWTH 2 DAYS  Final   Report Status PENDING  Incomplete  Culture,  blood (routine x 2)     Status: None (Preliminary result)   Collection Time: 06/17/15  1:00 PM  Result Value Ref Range Status   Specimen Description BLOOD LEFT HAND  Final   Special Requests IN PEDIATRIC BOTTLE 3CC  Final   Culture NO GROWTH 2 DAYS  Final   Report Status PENDING  Incomplete  MRSA PCR Screening     Status: None   Collection Time: 06/17/15  6:23 PM  Result Value  Ref Range Status   MRSA by PCR NEGATIVE NEGATIVE Final    Comment:        The GeneXpert MRSA Assay (FDA approved for NASAL specimens only), is one component of a comprehensive MRSA colonization surveillance program. It is not intended to diagnose MRSA infection nor to guide or monitor treatment for MRSA infections.      Studies:  Recent x-ray studies have been reviewed in detail by the Attending Physician  Scheduled Meds:  Scheduled Meds: . antiseptic oral rinse  7 mL Mouth Rinse q12n4p  . azithromycin  500 mg Intravenous Q24H  . budesonide (PULMICORT) nebulizer solution  0.25 mg Nebulization BID  . ceFEPime (MAXIPIME) IV  2 g Intravenous Q24H  . chlorhexidine  15 mL Mouth Rinse BID  . [START ON 06/20/2015] diltiazem  180 mg Oral Daily  . enoxaparin (LOVENOX) injection  40 mg Subcutaneous Q24H  . fluconazole (DIFLUCAN) IV  200 mg Intravenous Q24H  . furosemide  40 mg Oral BID  . hydrocortisone sod succinate (SOLU-CORTEF) inj  50 mg Intravenous BID  . insulin aspart  0-15 Units Subcutaneous 6 times per day  . insulin aspart  0-5 Units Subcutaneous QHS  . insulin aspart  3 Units Subcutaneous TID WC  . insulin glargine  8 Units Subcutaneous Daily  . levothyroxine  75 mcg Intravenous Daily  . potassium chloride  10 mEq Intravenous Q1 Hr x 5  . sodium chloride flush  3 mL Intravenous Q12H    Time spent on care of this patient: 40 mins   Shaletta Hinostroza, Geraldo Docker , MD  [facility] Office  [phone] Pager - [phone]  On-Call/Text Page:      [email or web address]      password [email or web address]  If 7PM-7AM,  please contact night-coverage [email or web address] Password [email or web address] 06/19/2015, 1:25 PM   LOS: 6 days   Care during the described time interval was provided by me .  I have reviewed this patient's available data, including medical history, events of note, physical examination, and all test results as part of my evaluation. I have personally reviewed and interpreted all radiology studies.   Dia Crawford, MD [phone] Pager

## 2015-06-19 NOTE — Progress Notes (Signed)
Notified Cortrak tube team for placement per Dr. Sherral Hammers order.

## 2015-06-19 NOTE — Progress Notes (Addendum)
Patient Name: Emily Kennedy Date of Encounter: 06/19/2015  Principal Problem:   Acute respiratory failure with hypoxia ([place]) Active Problems:   MGUS (monoclonal gammopathy of unknown significance)   Hyponatremia   Diabetes mellitus type 2, uncontrolled (HCC)   Anemia, chronic disease   Hypothyroidism   Essential hypertension   HCAP (healthcare-associated pneumonia)   Streptococcal pneumonia ([place])   Acute encephalopathy   Legionella pneumonia ([place])   Atrial fibrillation with RVR (HCC)   Acute on chronic diastolic CHF (congestive heart failure), NYHA class 3 ([place])   Length of Stay: 6  SUBJECTIVE  The patient is non-verbal, but appears comfortable.   CURRENT MEDS . antiseptic oral rinse  7 mL Mouth Rinse q12n4p  . azithromycin  500 mg Intravenous Q24H  . budesonide (PULMICORT) nebulizer solution  0.25 mg Nebulization BID  . ceFEPime (MAXIPIME) IV  2 g Intravenous Q24H  . chlorhexidine  15 mL Mouth Rinse BID  . diltiazem  120 mg Oral Daily  . enoxaparin (LOVENOX) injection  40 mg Subcutaneous Q24H  . fluconazole (DIFLUCAN) IV  200 mg Intravenous Q24H  . furosemide  60 mg Intravenous BID  . hydrocortisone sod succinate (SOLU-CORTEF) inj  50 mg Intravenous BID  . insulin aspart  0-15 Units Subcutaneous 6 times per day  . insulin aspart  0-5 Units Subcutaneous QHS  . insulin aspart  3 Units Subcutaneous TID WC  . insulin glargine  8 Units Subcutaneous Daily  . ipratropium-albuterol  3 mL Nebulization BID  . levothyroxine  75 mcg Intravenous Daily  . potassium chloride  10 mEq Intravenous Q1 Hr x 5  . sodium chloride flush  3 mL Intravenous Q12H  . [START ON 06/20/2015] vancomycin  1,000 mg Intravenous Q36H   . sodium chloride 50 mL/hr at 06/18/15 1532  . amiodarone 30 mg/hr (06/19/15 0825)   OBJECTIVE  Filed Vitals:   06/19/15 0514 06/19/15 0759 06/19/15 0829 06/19/15 1000  BP:  140/83  142/99  Pulse:  94 93   Temp:  98.2 F (36.8 C)    TempSrc:  Axillary      Resp:  25 19   Height:      Weight: 146 lb 9.7 oz (66.5 kg)     SpO2:  91% 92%     Intake/Output Summary (Last 24 hours) at 06/19/15 1012 Last data filed at 06/19/15 1003  Gross per 24 hour  Intake 1631.8 ml  Output      0 ml  Net 1631.8 ml   Filed Weights   06/14/15 1430 06/19/15 0514  Weight: 141 lb 12.1 oz (64.3 kg) 146 lb 9.7 oz (66.5 kg)   PHYSICAL EXAM  General: no distress. Neuro: Moves all extremities spontaneously. Psych: appears withdrawn. HEENT:  Normal  Neck: Supple without bruits or JVD. Lungs:  Resp regular and unlabored, CTA. Heart: RRR no s3, s4, or murmurs. Abdomen: Soft, non-tender, non-distended, BS + x 4.  Extremities: No clubbing, cyanosis or edema. DP/PT/Radials 2+ and equal bilaterally.  Accessory Clinical Findings  CBC  Recent Labs  06/18/15 0316 06/19/15 0128  WBC 3.1* 2.9*  HGB 9.7* 8.7*  HCT 28.8* 24.2*  MCV 88.6 91.7  PLT 129* 82*   Basic Metabolic Panel  Recent Labs  06/18/15 0316 06/19/15 0128 06/19/15 0845  NA 135 135  --   K 3.5 3.1*  --   CL 106 104  --   CO2 18* 19*  --   GLUCOSE 278* 242*  --   BUN  28* 29*  --   CREATININE 0.78 1.12*  --   CALCIUM 8.5* 8.5*  --   MG  --   --  1.8   Radiology/Studies  Dg Chest Port 1 View  06/17/2015  CLINICAL DATA:  Acute onset tachycardia today.  IMPRESSION: Appearance of the chest most consistent with congestive heart failure with associated pleural effusions. Aeration is worsened since yesterday's exam. Electronically Signed   By: Inge Rise M.D.   On: 06/17/2015 18:31   TELE: remains in SR with short run of a-fib - 8 beats, frequent PACs    ASSESSMENT AND PLAN  Principal Problem:  Atrial fib, rapid ventricular response Acute respiratory failure with hypoxia (HCC) Active Problems:  MGUS (monoclonal gammopathy of unknown significance)  Hyponatremia  Diabetes mellitus type 2, uncontrolled (HCC)  Anemia, chronic disease  Hypothyroidism  Essential  hypertension  HCAP (healthcare-associated pneumonia)  Streptococcal pneumonia [facility])  Acute encephalopathy  70 year old female with MGUS, HTN, NIDDM, and leukocytoclastic vasculitis admitted on 06/13/2015 with HCAP with sepsis.  She went into a-fib with this RVR around 3 pm yesterday, I have personally reviewed her telemetry strips and has been in sinus tachycardia with very frequent PACs and short runs of a-fib all day, until she went into a-fib at 3 pm and still having short intermittent episodes of SR in between. The fastest ventricular rate 190 BPM with a-fib. She was in acute respiratory distress on BiPAP, now off and comfortable.  She spontaneously cardioverted to SR at 11 pm on 4/4, total a-fib less than 24 hours.  Started on PO cardizem CD 120 mg daily, increase to 180 mg CD daily as she is hypertensive, complete amiodarone infusion, then discontinue.  Since her a-fib episode was less than 24 hours I would discontinue heparin drip.   Echo from 05/19/15 shows normal LVEF with grade 2 diastolic dysfunction, elevated filling pressures and moderately dilated left atrium. She now appears euvolemic, switch lasix to 40 mg po BID.   We will sign off, call us with any questions.   Signed, Dorothy Spark MD, [facility] 06/19/2015

## 2015-06-19 NOTE — Care Management Important Message (Signed)
Important Message  Patient Details  Name: Emily Kennedy MRN: [id number] Date of Birth: 12-16-1945   Medicare Important Message Given:  Yes    Conan Mcmanaway Abena 06/19/2015, 11:15 AM

## 2015-06-20 ENCOUNTER — Inpatient Hospital Stay (HOSPITAL_COMMUNITY): Payer: Medicare Other

## 2015-06-20 DIAGNOSIS — R410 Disorientation, unspecified: Secondary | ICD-10-CM

## 2015-06-20 LAB — GLUCOSE, CAPILLARY
GLUCOSE-CAPILLARY: 139 mg/dL — AB (ref 65–99)
GLUCOSE-CAPILLARY: 123 mg/dL — AB (ref 65–99)
Glucose-Capillary: 170 mg/dL — ABNORMAL HIGH (ref 65–99)
Glucose-Capillary: 188 mg/dL — ABNORMAL HIGH (ref 65–99)

## 2015-06-20 LAB — CBC WITH DIFFERENTIAL/PLATELET
BASOS ABS: 0 10*3/uL (ref 0.0–0.1)
Basophils Relative: 0 %
EOS ABS: 0 10*3/uL (ref 0.0–0.7)
Eosinophils Relative: 0 %
HCT: 29 % — ABNORMAL LOW (ref 36.0–46.0)
Hemoglobin: 9.8 g/dL — ABNORMAL LOW (ref 12.0–15.0)
LYMPHS PCT: 26 %
Lymphs Abs: 1.5 10*3/uL (ref 0.7–4.0)
MCH: 30.4 pg (ref 26.0–34.0)
MCHC: 33.8 g/dL (ref 30.0–36.0)
MCV: 90.1 fL (ref 78.0–100.0)
Monocytes Absolute: 0.4 10*3/uL (ref 0.1–1.0)
Monocytes Relative: 7 %
NEUTROS PCT: 67 %
Neutro Abs: 3.8 10*3/uL (ref 1.7–7.7)
PLATELETS: 150 10*3/uL (ref 150–400)
RBC: 3.22 MIL/uL — AB (ref 3.87–5.11)
RDW: 18.7 % — ABNORMAL HIGH (ref 11.5–15.5)
WBC: 5.7 10*3/uL (ref 4.0–10.5)

## 2015-06-20 LAB — COMPREHENSIVE METABOLIC PANEL
ALT: 32 U/L (ref 14–54)
AST: 65 U/L — AB (ref 15–41)
Albumin: 1.8 g/dL — ABNORMAL LOW (ref 3.5–5.0)
Alkaline Phosphatase: 61 U/L (ref 38–126)
Anion gap: 11 (ref 5–15)
BILIRUBIN TOTAL: 0.7 mg/dL (ref 0.3–1.2)
BUN: 29 mg/dL — AB (ref 6–20)
CO2: 17 mmol/L — ABNORMAL LOW (ref 22–32)
CREATININE: 1.19 mg/dL — AB (ref 0.44–1.00)
Calcium: 8.9 mg/dL (ref 8.9–10.3)
Chloride: 109 mmol/L (ref 101–111)
GFR, EST AFRICAN AMERICAN: 53 mL/min — AB (ref >60)
GFR, EST NON AFRICAN AMERICAN: 46 mL/min — AB (ref >60)
Glucose, Bld: 142 mg/dL — ABNORMAL HIGH (ref 65–99)
POTASSIUM: 3.2 mmol/L — AB (ref 3.5–5.1)
Sodium: 137 mmol/L (ref 135–145)
TOTAL PROTEIN: 6.9 g/dL (ref 6.5–8.1)

## 2015-06-20 LAB — CREATININE, SERUM
Creatinine, Ser: 1.13 mg/dL — ABNORMAL HIGH (ref 0.44–1.00)
GFR, EST AFRICAN AMERICAN: 57 mL/min — AB (ref >60)
GFR, EST NON AFRICAN AMERICAN: 49 mL/min — AB (ref >60)

## 2015-06-20 LAB — HEMOGLOBIN A1C
Hgb A1c MFr Bld: <4.3 % — ABNORMAL LOW (ref 4.8–5.6)
Mean Plasma Glucose: <77 mg/dL

## 2015-06-20 LAB — MAGNESIUM: MAGNESIUM: 1.6 mg/dL — AB (ref 1.7–2.4)

## 2015-06-20 LAB — LACTIC ACID, PLASMA: Lactic Acid, Venous: 1.1 mmol/L (ref 0.5–2.0)

## 2015-06-20 MED ORDER — MAGNESIUM SULFATE 50 % IJ SOLN
3.0000 g | Freq: Once | INTRAVENOUS | Status: AC
  Administered 2015-06-20: 3 g via INTRAVENOUS
  Filled 2015-06-20: qty 6

## 2015-06-20 MED ORDER — JEVITY 1.2 CAL PO LIQD
1000.0000 mL | ORAL | Status: DC
  Administered 2015-06-20 – 2015-06-21 (×3): 1000 mL
  Filled 2015-06-20 (×4): qty 1000

## 2015-06-20 MED ORDER — JEVITY 1.2 CAL PO LIQD: 1000.0000 mL | ORAL | Status: DC

## 2015-06-20 MED ORDER — POTASSIUM CHLORIDE 20 MEQ/15ML (10%) PO SOLN
50.0000 meq | Freq: Once | ORAL | Status: AC
  Administered 2015-06-20: 50 meq
  Filled 2015-06-20: qty 45

## 2015-06-20 NOTE — Progress Notes (Signed)
SLP Cancellation Note  Patient Details Name: Emily Kennedy MRN: [id number] DOB: 07/11/1945   Cancelled treatment:       Reason Eval/Treat Not Completed: Other (comment) (per RN patient has made no improvements in function and will be getting CorTrack ng tube placement today)  RN stated that patient continues with "very weak cough, even after oral care" and when RN had attempted to give her very small amount of applesauce 2 days ago, she took "forever" to initiate swallow with chin tuck. Per RN, patient has not made significant improvements in her overall function and did not feel that patient was appropriate to be seen today.   Nadara Mode Tarrell 06/20/2015, 4:07 PM  Sonia Baller, MA, CCC-SLP 06/20/2015 4:09 PM

## 2015-06-20 NOTE — Progress Notes (Signed)
Initial Nutrition Assessment  DOCUMENTATION CODES:   Not applicable  INTERVENTION:    Once CORTRAK tube placed, initiate Jevity 1.2 formula at 20 ml/hr and increase by 10 ml every 4 hours to goal rate of 60 ml/hr  Above TF regimen to provide 1728 kcals, 80 gm protein, 1168 ml of free water   NUTRITION DIAGNOSIS:   Inadequate oral intake related to inability to eat as evidenced by NPO status  GOAL:   Patient will meet greater than or equal to 90% of their needs  MONITOR:   TF tolerance, Labs, Weight trends, I & O's  REASON FOR ASSESSMENT:   Consult, Low Braden Enteral/tube feeding initiation and management  ASSESSMENT:   70 y.o. Female with PMHx of MGUS, HTN, chronic diastolic CHF, DM Type 2, leukocytoclastic vasculitis; presesnted with altered mental status and cough, weakness and poor appetite for past 4 days PTA.  RD unable to obtain nutrition hx. Speech Path following.  Pt was on a Dys 1-thin liquid diet previously, however, now NPO.   Plan is for [place] small bore feeding tube placement for short-term nutrition support. Low braden score places patient at risk for skin breakdown.  RD unable to complete Nutrition Focused Physical Exam at this time.  Diet Order:  Diet NPO time specified  Skin:  Reviewed, no issues  Last BM:  4/4  Height:   Ht Readings from Last 1 Encounters:  06/14/15 5\' 4"  (1.626 m)    Weight:   Wt Readings from Last 1 Encounters:  06/20/15 142 lb 3.2 oz (64.5 kg)    Ideal Body Weight:  54.5 kg  BMI:  Body mass index is 24.4 kg/(m^2).  Estimated Nutritional Needs:   Kcal:  1600-1800  Protein:  80-90 gm  Fluid:  1.6-1.8 L  EDUCATION NEEDS:   No education needs identified at this time  Arthur Holms, RD, LDN Pager #: [phone] After-Hours Pager #: [phone]

## 2015-06-20 NOTE — Progress Notes (Signed)
CSW continuing to follow for transfer to [facility] when pt is medically stable  Domenica Reamer, [place] Social Worker [phone]

## 2015-06-20 NOTE — Progress Notes (Signed)
Pharmacy Antibiotic Note Emily Kennedy is a 70 y.o. female admitted on 06/13/2015 with pneumonia.   Currently on day 7 of Cefepime for coverage of HAP and strep pna antigen positive in urine,day 4 of Azithromycin for coverage of legionella positive in urine and day 5 of fluconazole for oral thrush.    Plan: 1. Would Consider stopping Cefepime in next 24 - 48 hours as HAP and strep pna are likely to be adequately treated with this duration of therapy 2. Reasonable to continue Azithromycin for treatment of legionella for 7 - 10 days total  3. Vancomycin stopped on 4/6    Height: 5\' 4"  (162.6 cm) Weight: 142 lb 3.2 oz (64.5 kg) IBW/kg (Calculated) : 54.7  Temp (24hrs), Avg:98.5 F (36.9 C), Min:97.6 F (36.4 C), Max:99.1 F (37.3 C)   Recent Labs Lab 06/13/15 2052 06/13/15 2126 06/14/15 0128 06/14/15 0353 06/15/15 0836 06/16/15 0705 06/18/15 0316 06/18/15 1103 06/19/15 0128 06/20/15 0442  WBC 4.0  --   --  4.5  --  2.5* 3.1*  --  2.9*  --   CREATININE 0.73  --   --  0.77  0.73  --  0.83 0.78  --  1.12* 1.13*  LATICACIDVEN  --  0.74 0.71  --   --   --   --   --   --   --   VANCOTROUGH  --   --   --   --  13  --   --  35*  --   --   VANCORANDOM  --   --   --   --   --   --   --   --  27  --     Estimated Creatinine Clearance: 41.1 mL/min (by C-G formula based on Cr of 1.13).    Allergies  Allergen Reactions  . Benadryl [Diphenhydramine] Swelling  . Ciprofloxacin Hives  . Furosemide Itching, Swelling, Rash and Other (See Comments)    Made edema worse  . Nitrofurantoin Monohyd Macro Hives  . Septra [Bactrim] Hives  . Sulfa Drugs Cross Reactors Hives  . Ceclor [Cefaclor] Rash    Breaks out in rash chest down.    Antimicrobials this admission: 3/31 cefepime >> 4/3 Fluconazole (thrush) >> 4/4 azithro>> 3/31 vanc >> 4/6  Dose adjustments this admission: 4/2: VT 13 ~8h post dose, increased to 1000 mg q12h from 750 mg q12h 4/5: VT 38 on 1g q12h  Microbiology  results: 4/1 influenza A&B neg 4/1 H1N1 not detected 4/1 strep pneumo: positive 4/1 legionella: positive 3/31 blood >> 1/2 GPC chains= Diptheroids (corynebacterium)  4/1 urine >>neg/F 4/1 MRSA PCR neg 4/4 blood Cx x2: ngtd   Vincenza Hews, PharmD, BCPS 06/20/2015, 9:01 AM Pager: [phone]

## 2015-06-20 NOTE — Progress Notes (Signed)
[facility] TEAM 1 - Stepdown/ICU TEAM Progress Note  Emily Kennedy OBS:[id number] DOB: [birth year] DOA: 06/13/2015 PCP: [name], MD  Admit HPI / Brief Narrative: 70 y.o. WF PMHx MGUS, HTN, Chronic Diastolic CHF, DM Type 2, Leukocytoclastic Vasculitis   Who presented with altered mental status and cough, weakness and poor appetite for past 4 days PTA.  In the ED, pt was hypoxic requiring NRB, febrile to 100 2.23F, and tachycardic and tachypneic. Blood work showed sodium of 129, lactate normal. CXR showed a dense right diffuse opacity. Pt started on vanco and cefepime for HCAP treatment. Strep pneumonia and legionella are positive on this admission.   Hospital course complicated with development of atrial fibrillation with RVR requiring transfer to SDU 4/4. Cardio has seen her in consultation. She is on amiodarone an Cardizem drip.   HPI/Subjective: 4/7  A/O 3 (does not know when)    Assessment/Plan: Acute respiratory failure with hypoxia (HCC) / Sepsis secondary to HCAP (healthcare-associated pneumonia) secondary to streptococcus and legionella pneumonia / Leukopenia  - Sepsis criteria met on admission with fever, tachycardia, tachypnea, hypoxia  - Source of infection likely pneumonia as evidenced on chest x-ray on the admission, right-sided airspace opacities concerning for pneumonia - Streptococcus pneumonia antigen positive. Legionella antigen positive  - Influenza negative  - Continue current antibiotics: vanco and cefepime. Azithromycin added 4/4 once we had legionella results.  - Tapering steroids, currently 50 mg IV BID  -DuoNeb twice a day -Albuterol PRN   Atrial fibrillation with rapid RVR(CHADS vasc score 4) - HR as high as 190's - Cardizem CD 180 mg daily - Amiodarone drip; switch to tablet on 4/8 per NG tube - 2 D Echo from 05/19/15 shows normal LVEF with grade 2 diastolic dysfunction, elevated filling pressures and moderately dilated left atrium.  - The  patient will require long term anticoagulation, will follow up with cardio on recommendations   Acute on Chronic Diastolic CHF - As mentioned, 2 D ECHO 05/19/2015 showed normal EF and grade 2 diastolic dysfunction - Increase lasix 60 mg IV BID -Strict in and out; since admission +8.8 L  -Daily weight Filed Weights   06/14/15 1430 06/19/15 0514 06/20/15 0623  Weight: 64.3 kg (141 lb 12.1 oz) 66.5 kg (146 lb 9.7 oz) 64.5 kg (142 lb 3.2 oz)  -Transfuse for hemoglobin<8  Essential hypertension - See A. fib   Bacteremia secondary to diphtheroid species, likely a contaminant  - One of the blood cultures on admission growing diphtheroid species and another culture shows no growth - This is likely a contaminant - We did however repeat blood cultures to make sure bacteremia resolved  - Continue current antibiotics   Thrush - Continue fluconazole   Acute toxic encephalopathy/altered mental status - Possibly Due to acute infection, sepsis, however patient should be getting better not worse. CVA? Will obtain head CT - Once medically stable she will be going to skilled nursing facility per physical therapy evaluation and recommendation -Negative acute infarct by CT - Place CorTrak tube   Dysphagia - RN staff and son noted patient appeared to be aspirating.  -Changed to NPO. -Swallow study when patient can follow commands    Hyponatremia - Resolved with IV fluids - Hypokalemia -Potassium goal>4 -Potassium  50 mEq per NG tube  Hypomagnesemia -Magnesium goal> 2 -Magnesium IV 3 gm   Diabetes mellitus type 2, uncontrolled (HCC) -Hemoglobin A1c pending -Lipid panel pending -Lantus 8 units daily - Continue novolog 3 units QAC  -Increase to moderate SSI  Anemia, chronic disease - Due to history of MGUS - Hemoglobin stable at 10.2, 9.7   Hypothyroidism -4/5 TSH= 2.8 - Continue synthroid      Code Status: FULL Family Communication: [name] (son) present at time of  exam Disposition Plan: Resolution sepsis/altered mental status    Consultants:   Procedure/Significant Events: 3/6 echocardiogram;- Left ventricle: Mild LVH.- LVEF=  60% to 65%. -(grade 2 diastolic dysfunction).- Left atrium: moderately dilated. 4/4 PCXR;Appearance of the chest most consistent with CHF with associated pleural effusions.  -Aeration is worsened 4/6 CT head WO contrast; negative for acute infarct  Culture   Antibiotics: Vanco 06/13/2015>>4/6 Cefepime 06/13/2015 >> Fluconazole 06/16/2015 >> Azithromycin 4/4>>    DVT prophylaxis: Lovenox   Devices    LINES / TUBES:  NA    Continuous Infusions: . sodium chloride 50 mL/hr at 06/18/15 1532  . amiodarone 30 mg/hr (06/20/15 6314)    Objective: VITAL SIGNS: Temp: 97.6 F (36.4 C) (04/07 0804) Temp Source: Oral (04/07 0804) BP: 137/104 mmHg (04/07 0812) Pulse Rate: 98 (04/07 0812) SPO2; FIO2:   Intake/Output Summary (Last 24 hours) at 06/20/15 9702 Last data filed at 06/20/15 0600  Gross per 24 hour  Intake  661.7 ml  Output      0 ml  Net  661.7 ml     Exam: General: Eyes open, A/O 0, positive  acute respiratory distress Eyes: Negative headache, negative scleral hemorrhage ENT: Negative Runny nose, negative gingival bleeding, Neck:  Negative scars, masses, torticollis, lymphadenopathy, JVD Lungs: Clear to auscultation except for bilateral lower lobe rhonchi, negative wheezes or crackles Cardiovascular: Regular rate and rhythm without murmur gallop or rub normal S1 and S2 Abdomen:negative abdominal pain, nondistended, positive soft, bowel sounds, no rebound, no ascites, no appreciable mass Extremities: No significant cyanosis, clubbing, or edema bilateral lower extremities Psychiatric:  Negative depression, negative anxiety, negative fatigue, negative mania  Neurologic:  Patient's eyes open will turn toward you, follows no commands therefore unable to fully assess. Withdraws to painful  stimuli     Data Reviewed: Basic Metabolic Panel:  Recent Labs Lab 06/13/15 2052 06/14/15 0353 06/16/15 0705 06/18/15 0316 06/19/15 0128 06/19/15 0845 06/20/15 0442  NA 129* 129* 136 135 135  --   --   K 3.8 3.6 3.8 3.5 3.1*  --   --   CL 98* 101 108 106 104  --   --   CO2 19* 18* 18* 18* 19*  --   --   GLUCOSE 167* 177* 213* 278* 242*  --   --   BUN 17 19 27* 28* 29*  --   --   CREATININE 0.73 0.77  0.73 0.83 0.78 1.12*  --  1.13*  CALCIUM 8.9 8.3* 8.4* 8.5* 8.5*  --   --   MG  --   --   --   --   --  1.8  --    Liver Function Tests:  Recent Labs Lab 06/13/15 2052 06/14/15 0353  AST 43* 50*  ALT 21 20  ALKPHOS 42 37*  BILITOT 1.3* 1.0  PROT 6.2* 5.9*  ALBUMIN 2.6* 2.3*   No results for input(s): LIPASE, AMYLASE in the last 168 hours. No results for input(s): AMMONIA in the last 168 hours. CBC:  Recent Labs Lab 06/13/15 2052 06/14/15 0353 06/16/15 0705 06/18/15 0316 06/19/15 0128  WBC 4.0 4.5 2.5* 3.1* 2.9*  NEUTROABS 2.5  --   --   --   --   HGB 10.0* 10.2* 10.2* 9.7*  8.7*  HCT 28.7* 29.9* 29.3* 28.8* 24.2*  MCV 89.4 85.4 89.1 88.6 91.7  PLT 178 145* 146* 129* 82*   Cardiac Enzymes: No results for input(s): CKTOTAL, CKMB, CKMBINDEX, TROPONINI in the last 168 hours. BNP (last 3 results)  Recent Labs  05/17/15 1944  BNP 78.4    ProBNP (last 3 results) No results for input(s): PROBNP in the last 8760 hours.  CBG:  Recent Labs Lab 06/19/15 1149 06/19/15 1548 06/19/15 2038 06/20/15 0028 06/20/15 0404  GLUCAP 214* 180* 187* 170* 139*    Recent Results (from the past 240 hour(s))  Blood Culture (routine x 2)     Status: None   Collection Time: 06/13/15  8:52 PM  Result Value Ref Range Status   Specimen Description BLOOD LEFT HAND  Final   Special Requests BOTTLES DRAWN AEROBIC AND ANAEROBIC 5CC  Final   Culture  Setup Time   Final    GRAM POSITIVE COCCI IN CHAINS ANAEROBIC BOTTLE ONLY CRITICAL RESULT CALLED TO, READ BACK BY AND  VERIFIED WITH: S DOTY,RN AT 1330 06/15/15 BY L BENFIELD GRAM POSITIVE RODS AEROBIC BOTTLE ONLY CORRECTED RESULTS CALLED TO: C HANCOCK,RN AT 1046 06/17/15 BY L BENFIELD    Culture   Final    VIRIDANS STREPTOCOCCUS THE SIGNIFICANCE OF ISOLATING THIS ORGANISM FROM A SINGLE SET OF BLOOD CULTURES WHEN MULTIPLE SETS ARE DRAWN IS UNCERTAIN. PLEASE NOTIFY THE MICROBIOLOGY DEPARTMENT WITHIN ONE WEEK IF SPECIATION AND SENSITIVITIES ARE REQUIRED. DIPHTHEROIDS(CORYNEBACTERIUM SPECIES) Standardized susceptibility testing for this organism is not available.    Report Status 06/19/2015 FINAL  Final  Blood Culture (routine x 2)     Status: None   Collection Time: 06/13/15  9:04 PM  Result Value Ref Range Status   Specimen Description BLOOD RIGHT WRIST  Final   Special Requests BOTTLES DRAWN AEROBIC AND ANAEROBIC 5CC  Final   Culture NO GROWTH 5 DAYS  Final   Report Status 06/18/2015 FINAL  Final  Urine culture     Status: None   Collection Time: 06/14/15 10:00 AM  Result Value Ref Range Status   Specimen Description URINE, CATHETERIZED  Final   Special Requests NONE  Final   Culture NO GROWTH 1 DAY  Final   Report Status 06/15/2015 FINAL  Final  MRSA PCR Screening     Status: None   Collection Time: 06/14/15  2:15 PM  Result Value Ref Range Status   MRSA by PCR NEGATIVE NEGATIVE Final    Comment:        The GeneXpert MRSA Assay (FDA approved for NASAL specimens only), is one component of a comprehensive MRSA colonization surveillance program. It is not intended to diagnose MRSA infection nor to guide or monitor treatment for MRSA infections.   Culture, blood (routine x 2)     Status: None (Preliminary result)   Collection Time: 06/17/15 12:58 PM  Result Value Ref Range Status   Specimen Description BLOOD RIGHT ANTECUBITAL  Final   Special Requests IN PEDIATRIC BOTTLE  3CC  Final   Culture NO GROWTH 2 DAYS  Final   Report Status PENDING  Incomplete  Culture, blood (routine x 2)     Status:  None (Preliminary result)   Collection Time: 06/17/15  1:00 PM  Result Value Ref Range Status   Specimen Description BLOOD LEFT HAND  Final   Special Requests IN PEDIATRIC BOTTLE 3CC  Final   Culture NO GROWTH 2 DAYS  Final   Report Status PENDING  Incomplete  MRSA  PCR Screening     Status: None   Collection Time: 06/17/15  6:23 PM  Result Value Ref Range Status   MRSA by PCR NEGATIVE NEGATIVE Final    Comment:        The GeneXpert MRSA Assay (FDA approved for NASAL specimens only), is one component of a comprehensive MRSA colonization surveillance program. It is not intended to diagnose MRSA infection nor to guide or monitor treatment for MRSA infections.      Studies:  Recent x-ray studies have been reviewed in detail by the Attending Physician  Scheduled Meds:  Scheduled Meds: . antiseptic oral rinse  7 mL Mouth Rinse q12n4p  . azithromycin  500 mg Intravenous Q24H  . budesonide (PULMICORT) nebulizer solution  0.25 mg Nebulization BID  . ceFEPime (MAXIPIME) IV  2 g Intravenous Q24H  . chlorhexidine  15 mL Mouth Rinse BID  . diltiazem  180 mg Oral Daily  . enoxaparin (LOVENOX) injection  40 mg Subcutaneous Q24H  . fluconazole (DIFLUCAN) IV  200 mg Intravenous Q24H  . furosemide  40 mg Oral BID  . hydrocortisone sod succinate (SOLU-CORTEF) inj  50 mg Intravenous BID  . insulin aspart  0-15 Units Subcutaneous 6 times per day  . insulin aspart  3 Units Subcutaneous TID WC  . insulin glargine  8 Units Subcutaneous Daily  . levothyroxine  75 mcg Intravenous Daily  . sodium chloride flush  3 mL Intravenous Q12H    Time spent on care of this patient: 40 mins   Cami Delawder, Geraldo Docker , MD  [facility] Office  [phone] Pager - [phone]  On-Call/Text Page:      [email or web address]      password [email or web address]  If 7PM-7AM, please contact night-coverage [email or web address] Password [email or web address] 06/20/2015, 8:23 AM   LOS: 7 days   Care during the described time interval was provided by  me .  I have reviewed this patient's available data, including medical history, events of note, physical examination, and all test results as part of my evaluation. I have personally reviewed and interpreted all radiology studies.   Dia Crawford, MD [phone] Pager

## 2015-06-21 DIAGNOSIS — E876 Hypokalemia: Secondary | ICD-10-CM | POA: Diagnosis present

## 2015-06-21 DIAGNOSIS — E118 Type 2 diabetes mellitus with unspecified complications: Secondary | ICD-10-CM | POA: Diagnosis present

## 2015-06-21 DIAGNOSIS — E038 Other specified hypothyroidism: Secondary | ICD-10-CM | POA: Diagnosis present

## 2015-06-21 LAB — CBC WITH DIFFERENTIAL/PLATELET
Basophils Absolute: 0 10*3/uL (ref 0.0–0.1)
Basophils Relative: 0 %
EOS ABS: 0 10*3/uL (ref 0.0–0.7)
EOS PCT: 0 %
HCT: 27.3 % — ABNORMAL LOW (ref 36.0–46.0)
Hemoglobin: 9.1 g/dL — ABNORMAL LOW (ref 12.0–15.0)
LYMPHS ABS: 0.7 10*3/uL (ref 0.7–4.0)
Lymphocytes Relative: 19 %
MCH: 30 pg (ref 26.0–34.0)
MCHC: 33.3 g/dL (ref 30.0–36.0)
MCV: 90.1 fL (ref 78.0–100.0)
MONOS PCT: 6 %
Monocytes Absolute: 0.2 10*3/uL (ref 0.1–1.0)
Neutro Abs: 2.9 10*3/uL (ref 1.7–7.7)
Neutrophils Relative %: 75 %
Platelets: 155 10*3/uL (ref 150–400)
RBC: 3.03 MIL/uL — ABNORMAL LOW (ref 3.87–5.11)
RDW: 17.9 % — ABNORMAL HIGH (ref 11.5–15.5)
WBC: 3.8 10*3/uL — AB (ref 4.0–10.5)

## 2015-06-21 LAB — COMPREHENSIVE METABOLIC PANEL
ALK PHOS: 71 U/L (ref 38–126)
ALT: 32 U/L (ref 14–54)
AST: 54 U/L — AB (ref 15–41)
Albumin: 1.6 g/dL — ABNORMAL LOW (ref 3.5–5.0)
Anion gap: 10 (ref 5–15)
BILIRUBIN TOTAL: 0.8 mg/dL (ref 0.3–1.2)
BUN: 28 mg/dL — AB (ref 6–20)
CALCIUM: 8.5 mg/dL — AB (ref 8.9–10.3)
CO2: 20 mmol/L — ABNORMAL LOW (ref 22–32)
CREATININE: 1.18 mg/dL — AB (ref 0.44–1.00)
Chloride: 108 mmol/L (ref 101–111)
GFR calc Af Amer: 54 mL/min — ABNORMAL LOW (ref >60)
GFR, EST NON AFRICAN AMERICAN: 46 mL/min — AB (ref >60)
Glucose, Bld: 241 mg/dL — ABNORMAL HIGH (ref 65–99)
POTASSIUM: 2.8 mmol/L — AB (ref 3.5–5.1)
Sodium: 138 mmol/L (ref 135–145)
TOTAL PROTEIN: 6.6 g/dL (ref 6.5–8.1)

## 2015-06-21 LAB — GLUCOSE, CAPILLARY
GLUCOSE-CAPILLARY: 192 mg/dL — AB (ref 65–99)
GLUCOSE-CAPILLARY: 201 mg/dL — AB (ref 65–99)
GLUCOSE-CAPILLARY: 223 mg/dL — AB (ref 65–99)
GLUCOSE-CAPILLARY: 247 mg/dL — AB (ref 65–99)
GLUCOSE-CAPILLARY: 257 mg/dL — AB (ref 65–99)
Glucose-Capillary: 183 mg/dL — ABNORMAL HIGH (ref 65–99)
Glucose-Capillary: 178 mg/dL — ABNORMAL HIGH (ref 65–99)
Glucose-Capillary: 266 mg/dL — ABNORMAL HIGH (ref 65–99)
Glucose-Capillary: 223 mg/dL — ABNORMAL HIGH (ref 65–99)

## 2015-06-21 LAB — MAGNESIUM: MAGNESIUM: 2.7 mg/dL — AB (ref 1.7–2.4)

## 2015-06-21 MED ORDER — GLUCERNA 1.2 CAL PO LIQD
1000.0000 mL | ORAL | Status: DC
  Administered 2015-06-21 – 2015-06-23 (×3): 1000 mL
  Filled 2015-06-21 (×5): qty 1000

## 2015-06-21 MED ORDER — AMIODARONE HCL 200 MG PO TABS
200.0000 mg | ORAL_TABLET | Freq: Two times a day (BID) | ORAL | Status: DC
  Administered 2015-06-21 – 2015-06-22 (×2): 200 mg via NASOGASTRIC
  Filled 2015-06-21 (×2): qty 1

## 2015-06-21 MED ORDER — INSULIN ASPART 100 UNIT/ML ~~LOC~~ SOLN
10.0000 [IU] | Freq: Three times a day (TID) | SUBCUTANEOUS | Status: DC
  Administered 2015-06-21 – 2015-06-22 (×4): 10 [IU] via SUBCUTANEOUS

## 2015-06-21 MED ORDER — INSULIN GLARGINE 100 UNIT/ML ~~LOC~~ SOLN
10.0000 [IU] | Freq: Every day | SUBCUTANEOUS | Status: DC
  Administered 2015-06-21 – 2015-06-22 (×2): 10 [IU] via SUBCUTANEOUS
  Filled 2015-06-21 (×3): qty 0.1

## 2015-06-21 MED ORDER — INSULIN ASPART 100 UNIT/ML ~~LOC~~ SOLN
0.0000 [IU] | SUBCUTANEOUS | Status: DC
  Administered 2015-06-21 – 2015-06-22 (×3): 7 [IU] via SUBCUTANEOUS
  Administered 2015-06-22: 4 [IU] via SUBCUTANEOUS
  Administered 2015-06-22 – 2015-06-23 (×2): 7 [IU] via SUBCUTANEOUS

## 2015-06-21 MED ORDER — FUROSEMIDE 40 MG PO TABS
60.0000 mg | ORAL_TABLET | Freq: Two times a day (BID) | ORAL | Status: DC
  Administered 2015-06-21 – 2015-06-22 (×2): 60 mg via ORAL
  Filled 2015-06-21 (×3): qty 2

## 2015-06-21 MED ORDER — POTASSIUM CHLORIDE 20 MEQ/15ML (10%) PO SOLN
50.0000 meq | Freq: Once | ORAL | Status: AC
  Administered 2015-06-21: 50 meq via ORAL
  Filled 2015-06-21: qty 45

## 2015-06-21 MED ORDER — HYDROCORTISONE NA SUCCINATE PF 100 MG IJ SOLR
50.0000 mg | Freq: Every day | INTRAMUSCULAR | Status: DC
  Administered 2015-06-21 – 2015-06-23 (×3): 50 mg via INTRAVENOUS
  Filled 2015-06-21 (×3): qty 2

## 2015-06-21 NOTE — Progress Notes (Signed)
[facility] TEAM 1 - Stepdown/ICU TEAM Progress Note  Emily Kennedy EVO:[id number] DOB: 11-Sep-1945 DOA: 06/13/2015 PCP: [name], MD  Admit HPI / Brief Narrative: 70 y.o. WF PMHx MGUS, HTN, Chronic Diastolic CHF, DM Type 2, Leukocytoclastic Vasculitis   Who presented with altered mental status and cough, weakness and poor appetite for past 4 days PTA.  In the ED, pt was hypoxic requiring NRB, febrile to 100 2.44F, and tachycardic and tachypneic. Blood work showed sodium of 129, lactate normal. CXR showed a dense right diffuse opacity. Pt started on vanco and cefepime for HCAP treatment. Strep pneumonia and legionella are positive on this admission.   Hospital course complicated with development of atrial fibrillation with RVR requiring transfer to SDU 4/4. Cardio has seen her in consultation. She is on amiodarone an Cardizem drip.   HPI/Subjective: 4/8  A/O 3 (does not know when), set in chair for several hours today, now resting comfortably in bed. Request that any sedating medication be stopped or decreased     Assessment/Plan: Acute respiratory failure with hypoxia (HCC) / Sepsis secondary to HCAP (healthcare-associated pneumonia) positive streptococcus and legionella pneumonia / Leukopenia  - Sepsis criteria met on admission with fever, tachycardia, tachypnea, hypoxia  - Source of infection likely pneumonia as evidenced on chest x-ray on the admission, right-sided airspace opacities concerning for pneumonia - Streptococcus pneumonia antigen positive. Legionella antigen positive  - Influenza negative  - Continue current antibiotics: vanco and cefepime. Azithromycin added 4/4 once we had legionella results.  -4/8 Tapering steroids, decrease Solu-Cortef to 50 mg IV DAILY  -DuoNeb twice a day -Albuterol PRN -PCXR on 4/9; should be last day of antibiotics.   Atrial fibrillation with rapid RVR(CHADS vasc score 4)  - HR as high as 190's - Cardizem CD 180 mg daily - DC  Amiodarone drip; switch to 200 mg BID per NG tube  - 2 D Echo from 05/19/15 shows normal LVEF with grade 2 diastolic dysfunction, elevated filling pressures and moderately dilated left atrium.  - The patient will require long term anticoagulation   Acute on Chronic Diastolic CHF - As mentioned, 2 D ECHO 05/19/2015 showed normal EF and grade 2 diastolic dysfunction - Increase lasix 60 mg IV BID -Strict in and out; since admission + 12.7 L  -Daily weight Filed Weights   06/19/15 0514 06/20/15 0623 06/21/15 0444  Weight: 66.5 kg (146 lb 9.7 oz) 64.5 kg (142 lb 3.2 oz) 62.3 kg (137 lb 5.6 oz)  -Transfuse for hemoglobin<8  Essential hypertension - See A. fib   Bacteremia secondary to diphtheroid species, likely a contaminant  - One of the blood cultures on admission growing diphtheroid species and another culture shows no growth,This is likely a contaminant - We did however repeat blood cultures to make sure bacteremia resolved  - Continue current antibiotics; consult ID on 4/9 prior daily seen antibiotics   Thrush - Continue fluconazole   Acute toxic encephalopathy/altered mental status - CT; negative acute infarct -Patient's cognition improving - Once medically stable she will be going to skilled nursing facility per physical therapy evaluation and recommendation   Dysphagia - RN staff and son noted patient appeared to be aspirating.  -Changed to NPO. -Continue feedings via CorTrak tube -Patient's cognition improving will request swallow study on 4/10  Hyponatremia - Resolved with IV fluids - Hypokalemia -Potassium goal>4 -Potassium  50 mEq per NG tube  Hypomagnesemia -Magnesium goal> 2   Diabetes mellitus type 2, controlled with complication  -4/6 Hemoglobin A1c =  4.3 -Lipid panel pending -Increase Lantus 10 units daily - Increase Novolog 10 units QAC  -Increase to resistant SSI -Change feedings to Glucerna 66m/hr   Anemia, chronic disease - Due to history  of MGUS - Hemoglobin stable    Hypothyroidism -4/5 TSH= 2.8 - Continue synthroid 75 g daily      Code Status: FULL Family Communication: Daughter present at time of exam Disposition Plan: Resolution sepsis/altered mental status    Consultants:   Procedure/Significant Events: 3/6 echocardiogram;- Left ventricle: Mild LVH.- LVEF=  60% to 65%. -(grade 2 diastolic dysfunction).- Left atrium: moderately dilated. 4/4 PCXR;Appearance of the chest most consistent with CHF with associated pleural effusions.  -Aeration is worsened 4/6 CT head WO contrast; negative for acute infarct  Culture 3/31 blood left hand positive strep viridans/DIPHTHEROIDS(CORYNEBACTERIUM SPECIES) 3/31 blood right wrist negative final  4/1 MRSA by PCR  4/4 blood right AC/left hand NGTD     Antibiotics: Vanco 06/13/2015>>4/6 Cefepime 06/13/2015 >> Fluconazole 06/16/2015 >> Azithromycin 4/4>>    DVT prophylaxis: Lovenox   Devices    LINES / TUBES:  NA    Continuous Infusions: . sodium chloride 50 mL/hr at 06/20/15 1600  . feeding supplement (GLUCERNA 1.2 CAL)      Objective: VITAL SIGNS: Temp: 96.6 F (35.9 C) (04/08 1641) Temp Source: Axillary (04/08 1641) BP: 136/70 mmHg (04/08 1641) Pulse Rate: 86 (04/08 1641) SPO2; FIO2:   Intake/Output Summary (Last 24 hours) at 06/21/15 1837 Last data filed at 06/21/15 1200  Gross per 24 hour  Intake 2765.74 ml  Output      0 ml  Net 2765.74 ml     Exam: General: A/O 3 (does not know when), negative  acute respiratory distress Eyes: Negative headache, negative scleral hemorrhage ENT: Negative Runny nose, negative gingival bleeding, Neck:  Negative scars, masses, torticollis, lymphadenopathy, JVD Lungs: Clear to auscultation except for bilateral lower lobe rhonchi, negative wheezes or crackles Cardiovascular: Regular rate and rhythm without murmur gallop or rub normal S1 and S2 Abdomen:negative abdominal pain, nondistended,  positive soft, bowel sounds, no rebound, no ascites, no appreciable mass Extremities: No significant cyanosis, clubbing, or edema bilateral lower extremities Psychiatric:  Negative depression, negative anxiety, negative fatigue, negative mania  Neurologic:  Cranial nerves II through XII intact, tongue/uvula midline, all extremities muscle strength 3/5, sensation intact throughout, positive dysarthria, negative expressive aphasia, negative receptive aphasia.      Data Reviewed: Basic Metabolic Panel:  Recent Labs Lab 06/16/15 0705 06/18/15 0316 06/19/15 0128 06/19/15 0845 06/20/15 0442 06/20/15 0927 06/21/15 0240  NA 136 135 135  --   --  137 138  K 3.8 3.5 3.1*  --   --  3.2* 2.8*  CL 108 106 104  --   --  109 108  CO2 18* 18* 19*  --   --  17* 20*  GLUCOSE 213* 278* 242*  --   --  142* 241*  BUN 27* 28* 29*  --   --  29* 28*  CREATININE 0.83 0.78 1.12*  --  1.13* 1.19* 1.18*  CALCIUM 8.4* 8.5* 8.5*  --   --  8.9 8.5*  MG  --   --   --  1.8  --  1.6* 2.7*   Liver Function Tests:  Recent Labs Lab 06/20/15 0927 06/21/15 0240  AST 65* 54*  ALT 32 32  ALKPHOS 61 71  BILITOT 0.7 0.8  PROT 6.9 6.6  ALBUMIN 1.8* 1.6*   No results for input(s): LIPASE, AMYLASE in  the last 168 hours. No results for input(s): AMMONIA in the last 168 hours. CBC:  Recent Labs Lab 06/16/15 0705 06/18/15 0316 06/19/15 0128 06/20/15 0927 06/21/15 0240  WBC 2.5* 3.1* 2.9* 5.7 3.8*  NEUTROABS  --   --   --  3.8 2.9  HGB 10.2* 9.7* 8.7* 9.8* 9.1*  HCT 29.3* 28.8* 24.2* 29.0* 27.3*  MCV 89.1 88.6 91.7 90.1 90.1  PLT 146* 129* 82* 150 155   Cardiac Enzymes: No results for input(s): CKTOTAL, CKMB, CKMBINDEX, TROPONINI in the last 168 hours. BNP (last 3 results)  Recent Labs  05/17/15 1944  BNP 78.4    ProBNP (last 3 results) No results for input(s): PROBNP in the last 8760 hours.  CBG:  Recent Labs Lab 06/21/15 0040 06/21/15 0406 06/21/15 0810 06/21/15 1155 06/21/15 1638    GLUCAP 201* 247* 266* 257* 223*    Recent Results (from the past 240 hour(s))  Blood Culture (routine x 2)     Status: None   Collection Time: 06/13/15  8:52 PM  Result Value Ref Range Status   Specimen Description BLOOD LEFT HAND  Final   Special Requests BOTTLES DRAWN AEROBIC AND ANAEROBIC 5CC  Final   Culture  Setup Time   Final    GRAM POSITIVE COCCI IN CHAINS ANAEROBIC BOTTLE ONLY CRITICAL RESULT CALLED TO, READ BACK BY AND VERIFIED WITH: S DOTY,RN AT 1330 06/15/15 BY L BENFIELD GRAM POSITIVE RODS AEROBIC BOTTLE ONLY CORRECTED RESULTS CALLED TO: C HANCOCK,RN AT 1046 06/17/15 BY L BENFIELD    Culture   Final    VIRIDANS STREPTOCOCCUS THE SIGNIFICANCE OF ISOLATING THIS ORGANISM FROM A SINGLE SET OF BLOOD CULTURES WHEN MULTIPLE SETS ARE DRAWN IS UNCERTAIN. PLEASE NOTIFY THE MICROBIOLOGY DEPARTMENT WITHIN ONE WEEK IF SPECIATION AND SENSITIVITIES ARE REQUIRED. DIPHTHEROIDS(CORYNEBACTERIUM SPECIES) Standardized susceptibility testing for this organism is not available.    Report Status 06/19/2015 FINAL  Final  Blood Culture (routine x 2)     Status: None   Collection Time: 06/13/15  9:04 PM  Result Value Ref Range Status   Specimen Description BLOOD RIGHT WRIST  Final   Special Requests BOTTLES DRAWN AEROBIC AND ANAEROBIC 5CC  Final   Culture NO GROWTH 5 DAYS  Final   Report Status 06/18/2015 FINAL  Final  Urine culture     Status: None   Collection Time: 06/14/15 10:00 AM  Result Value Ref Range Status   Specimen Description URINE, CATHETERIZED  Final   Special Requests NONE  Final   Culture NO GROWTH 1 DAY  Final   Report Status 06/15/2015 FINAL  Final  MRSA PCR Screening     Status: None   Collection Time: 06/14/15  2:15 PM  Result Value Ref Range Status   MRSA by PCR NEGATIVE NEGATIVE Final    Comment:        The GeneXpert MRSA Assay (FDA approved for NASAL specimens only), is one component of a comprehensive MRSA colonization surveillance program. It is not intended  to diagnose MRSA infection nor to guide or monitor treatment for MRSA infections.   Culture, blood (routine x 2)     Status: None (Preliminary result)   Collection Time: 06/17/15 12:58 PM  Result Value Ref Range Status   Specimen Description BLOOD RIGHT ANTECUBITAL  Final   Special Requests IN PEDIATRIC BOTTLE  3CC  Final   Culture NO GROWTH 4 DAYS  Final   Report Status PENDING  Incomplete  Culture, blood (routine x 2)  Status: None (Preliminary result)   Collection Time: 06/17/15  1:00 PM  Result Value Ref Range Status   Specimen Description BLOOD LEFT HAND  Final   Special Requests IN PEDIATRIC BOTTLE 3CC  Final   Culture NO GROWTH 4 DAYS  Final   Report Status PENDING  Incomplete  MRSA PCR Screening     Status: None   Collection Time: 06/17/15  6:23 PM  Result Value Ref Range Status   MRSA by PCR NEGATIVE NEGATIVE Final    Comment:        The GeneXpert MRSA Assay (FDA approved for NASAL specimens only), is one component of a comprehensive MRSA colonization surveillance program. It is not intended to diagnose MRSA infection nor to guide or monitor treatment for MRSA infections.      Studies:  Recent x-ray studies have been reviewed in detail by the Attending Physician  Scheduled Meds:  Scheduled Meds: . amiodarone  200 mg Per NG tube BID  . antiseptic oral rinse  7 mL Mouth Rinse q12n4p  . azithromycin  500 mg Intravenous Q24H  . budesonide (PULMICORT) nebulizer solution  0.25 mg Nebulization BID  . ceFEPime (MAXIPIME) IV  2 g Intravenous Q24H  . chlorhexidine  15 mL Mouth Rinse BID  . diltiazem  180 mg Oral Daily  . enoxaparin (LOVENOX) injection  40 mg Subcutaneous Q24H  . fluconazole (DIFLUCAN) IV  200 mg Intravenous Q24H  . furosemide  60 mg Oral BID  . hydrocortisone sod succinate (SOLU-CORTEF) inj  50 mg Intravenous Daily  . insulin aspart  0-20 Units Subcutaneous 6 times per day  . insulin aspart  10 Units Subcutaneous TID WC  . insulin glargine   10 Units Subcutaneous Daily  . levothyroxine  75 mcg Intravenous Daily  . sodium chloride flush  3 mL Intravenous Q12H    Time spent on care of this patient: 40 mins   WOODS, Geraldo Docker , MD  [facility] Office  [phone] Pager - [phone]  On-Call/Text Page:      [email or web address]      password [email or web address]  If 7PM-7AM, please contact night-coverage [email or web address] Password [facility] 06/21/2015, 6:37 PM   LOS: 8 days   Care during the described time interval was provided by me .  I have reviewed this patient's available data, including medical history, events of note, physical examination, and all test results as part of my evaluation. I have personally reviewed and interpreted all radiology studies.   Dia Crawford, MD [phone] Pager

## 2015-06-22 ENCOUNTER — Inpatient Hospital Stay (HOSPITAL_COMMUNITY): Payer: Medicare Other

## 2015-06-22 LAB — COMPREHENSIVE METABOLIC PANEL
ALK PHOS: 78 U/L (ref 38–126)
ALT: 33 U/L (ref 14–54)
ANION GAP: 12 (ref 5–15)
AST: 51 U/L — AB (ref 15–41)
Albumin: 1.6 g/dL — ABNORMAL LOW (ref 3.5–5.0)
BILIRUBIN TOTAL: 0.7 mg/dL (ref 0.3–1.2)
BUN: 26 mg/dL — AB (ref 6–20)
CO2: 21 mmol/L — AB (ref 22–32)
Calcium: 8.7 mg/dL — ABNORMAL LOW (ref 8.9–10.3)
Chloride: 107 mmol/L (ref 101–111)
Creatinine, Ser: 1.15 mg/dL — ABNORMAL HIGH (ref 0.44–1.00)
GFR calc Af Amer: 55 mL/min — ABNORMAL LOW (ref >60)
GFR calc non Af Amer: 48 mL/min — ABNORMAL LOW (ref >60)
GLUCOSE: 150 mg/dL — AB (ref 65–99)
POTASSIUM: 2.5 mmol/L — AB (ref 3.5–5.1)
SODIUM: 140 mmol/L (ref 135–145)
TOTAL PROTEIN: 7 g/dL (ref 6.5–8.1)

## 2015-06-22 LAB — CBC WITH DIFFERENTIAL/PLATELET
Basophils Absolute: 0 10*3/uL (ref 0.0–0.1)
Basophils Relative: 0 %
EOS ABS: 0 10*3/uL (ref 0.0–0.7)
EOS PCT: 0 %
HCT: 27.9 % — ABNORMAL LOW (ref 36.0–46.0)
HEMOGLOBIN: 9.5 g/dL — AB (ref 12.0–15.0)
LYMPHS ABS: 1 10*3/uL (ref 0.7–4.0)
LYMPHS PCT: 16 %
MCH: 30.2 pg (ref 26.0–34.0)
MCHC: 34.1 g/dL (ref 30.0–36.0)
MCV: 88.6 fL (ref 78.0–100.0)
MONOS PCT: 8 %
Monocytes Absolute: 0.5 10*3/uL (ref 0.1–1.0)
Neutro Abs: 4.9 10*3/uL (ref 1.7–7.7)
Neutrophils Relative %: 76 %
PLATELETS: 219 10*3/uL (ref 150–400)
RBC: 3.15 MIL/uL — ABNORMAL LOW (ref 3.87–5.11)
RDW: 17.9 % — ABNORMAL HIGH (ref 11.5–15.5)
WBC: 6.5 10*3/uL (ref 4.0–10.5)

## 2015-06-22 LAB — GLUCOSE, CAPILLARY
GLUCOSE-CAPILLARY: 140 mg/dL — AB (ref 65–99)
GLUCOSE-CAPILLARY: 222 mg/dL — AB (ref 65–99)
Glucose-Capillary: 137 mg/dL — ABNORMAL HIGH (ref 65–99)
Glucose-Capillary: 153 mg/dL — ABNORMAL HIGH (ref 65–99)
Glucose-Capillary: 226 mg/dL — ABNORMAL HIGH (ref 65–99)
Glucose-Capillary: 88 mg/dL (ref 65–99)

## 2015-06-22 LAB — CULTURE, BLOOD (ROUTINE X 2)
CULTURE: NO GROWTH
CULTURE: NO GROWTH

## 2015-06-22 LAB — POTASSIUM: Potassium: 3.1 mmol/L — ABNORMAL LOW (ref 3.5–5.1)

## 2015-06-22 LAB — MAGNESIUM
MAGNESIUM: 1.9 mg/dL (ref 1.7–2.4)
Magnesium: 1.8 mg/dL (ref 1.7–2.4)

## 2015-06-22 MED ORDER — POTASSIUM CHLORIDE CRYS ER 20 MEQ PO TBCR: 40.0000 meq | EXTENDED_RELEASE_TABLET | Freq: Once | ORAL | Status: DC

## 2015-06-22 MED ORDER — FUROSEMIDE 80 MG PO TABS
80.0000 mg | ORAL_TABLET | Freq: Two times a day (BID) | ORAL | Status: DC
  Administered 2015-06-22 – 2015-06-23 (×2): 80 mg via ORAL
  Filled 2015-06-22 (×2): qty 1

## 2015-06-22 MED ORDER — AMIODARONE HCL 200 MG PO TABS
300.0000 mg | ORAL_TABLET | Freq: Two times a day (BID) | ORAL | Status: DC
  Administered 2015-06-22 – 2015-06-23 (×2): 300 mg via NASOGASTRIC
  Filled 2015-06-22 (×2): qty 2

## 2015-06-22 MED ORDER — POTASSIUM CHLORIDE 10 MEQ/100ML IV SOLN
10.0000 meq | INTRAVENOUS | Status: AC
  Administered 2015-06-22 (×3): 10 meq via INTRAVENOUS
  Filled 2015-06-22 (×2): qty 100

## 2015-06-22 MED ORDER — INSULIN GLARGINE 100 UNIT/ML ~~LOC~~ SOLN
15.0000 [IU] | Freq: Every day | SUBCUTANEOUS | Status: DC
Start: 2015-06-23 — End: 2015-06-23
  Filled 2015-06-22: qty 0.15

## 2015-06-22 MED ORDER — DILTIAZEM HCL ER COATED BEADS 240 MG PO CP24
240.0000 mg | ORAL_CAPSULE | Freq: Every day | ORAL | Status: DC
  Administered 2015-06-23: 240 mg via ORAL
  Filled 2015-06-22: qty 1

## 2015-06-22 MED ORDER — INSULIN ASPART 100 UNIT/ML ~~LOC~~ SOLN
15.0000 [IU] | Freq: Three times a day (TID) | SUBCUTANEOUS | Status: DC
  Administered 2015-06-22 – 2015-06-23 (×3): 15 [IU] via SUBCUTANEOUS

## 2015-06-22 MED ORDER — POTASSIUM CHLORIDE 20 MEQ/15ML (10%) PO SOLN
40.0000 meq | Freq: Once | ORAL | Status: AC
  Administered 2015-06-22: 40 meq via ORAL
  Filled 2015-06-22: qty 30

## 2015-06-22 NOTE — Plan of Care (Signed)
Problem: Activity: Goal: Risk for activity intolerance will decrease Outcome: Progressing Discussed fears of the patient being turned and changed throughout the shift.  Patient did very well with bath and trust for moving her gently.

## 2015-06-22 NOTE — Progress Notes (Signed)
Emily Kennedy TEAM 1 - Stepdown/ICU TEAM Progress Note  Emily Kennedy IRS:[id number] DOB: 08-03-1945 DOA: 06/13/2015 PCP: [name], MD  Admit HPI / Brief Narrative: 70 y.o. WF PMHx MGUS, HTN, Chronic Diastolic CHF, DM Type 2, Leukocytoclastic Vasculitis   Who presented with altered mental status and cough, weakness and poor appetite for past 4 days PTA.  In the ED, pt was hypoxic requiring NRB, febrile to 100 2.29F, and tachycardic and tachypneic. Blood work showed sodium of 129, lactate normal. CXR showed a dense right diffuse opacity. Pt started on vanco and cefepime for HCAP treatment. Strep pneumonia and legionella are positive on this admission.   Hospital course complicated with development of atrial fibrillation with RVR requiring transfer to SDU 4/4. Cardio has seen her in consultation. She is on amiodarone an Cardizem drip.   HPI/Subjective: 4/9  A/O 3 (does not know when), NAD.   Assessment/Plan: Acute respiratory failure with hypoxia (HCC) / Sepsis secondary to HCAP (healthcare-associated pneumonia) positive streptococcus and legionella pneumonia / Leukopenia  - Sepsis criteria met on admission with fever, tachycardia, tachypnea, hypoxia  - On chest x-ray on the admission, right-sided airspace opacities concerning for pneumonia;Streptococcus pneumonia antigen positive. Legionella antigen positive  - Influenza negative  - Completed course appropriate antibiotic  -4/8 Tapering steroids, decrease Solu-Cortef to 50 mg IV DAILY , on 4/9 decreased to 25 mg daily -DuoNeb twice a day -Albuterol PRN -PCXR on 4/9; continued bilateral effusion and pulmonary edema   Atrial fibrillation with rapid RVR(CHADS vasc score 4)  - HR as high as 190's - Increase Cardizem CD 180 mg daily - Increase Amiodarone 300 mg BID per NG tube  - 2 D Echo from 0/0/93; chronic diastolic CHF see results  - speak with family and a NCM Henry on choice of long term anticoagulation( what will  Medicare pay for?)   Acute on Chronic Diastolic CHF - Increase lasix 80 mg IV BID -Strict in and out; since admission + 12.7 L  -Daily weight Filed Weights   06/20/15 0623 06/21/15 0444 06/22/15 0354  Weight: 64.5 kg (142 lb 3.2 oz) 62.3 kg (137 lb 5.6 oz) 61.3 kg (135 lb 2.3 oz)  -Transfuse for hemoglobin<8  Essential hypertension - See A. fib   Bacteremia secondary to diphtheroid species/Strep Viridans, likely a contaminant  - 1/2 blood cultures on admission growing diphtheroid species/strep viridans. This is likely a contaminant - We did however repeat blood cultures to make sure bacteremia resolved NGTD  - Spoke with Dr. Scharlene Gloss ID and agrees most likely contaminant safe to stop antibiotics.   Thrush - Continue fluconazole, complete 7 day course, last day 4/10   Acute toxic encephalopathy/altered mental status - CT; negative acute infarct -Patient's cognition improving - Once medically stable she will be going to skilled nursing facility per physical therapy evaluation and recommendation -Consult speech work with patient dysarthria/expressive aphasia   Dysphagia - RN staff and son noted patient appeared to be aspirating.  -Changed to NPO. -Continue feedings via CorTrak tube -Patient's cognition improving will request swallow study on 4/10  Hyponatremia - Resolved with IV fluids -Normal saline 31m/hr  Hypokalemia -Potassium goal>4 -Potassium  40 mEq per NG tube + 40 mEq IV -Recheck potassium/magnesium at 1300  Hypomagnesemia -Magnesium goal> 2   Diabetes mellitus type 2, controlled with complication -4/6 Hemoglobin A1c = 4.3 -Lipid panel pending -Increase Lantus 15 units daily -Increase Novolog 15 units QAC  -Resistant SSI -Feedings Glucerna 67mhr   Anemia, chronic disease - Due  to history of MGUS - Hemoglobin stable    Hypothyroidism -4/5 TSH= 2.8 - Continue synthroid 75 g daily      Code Status: FULL Family Communication: none  present at time of exam Disposition Plan: Resolution sepsis/altered mental status    Consultants:   Procedure/Significant Events: 3/6 echocardiogram;- Left ventricle: Mild LVH.- LVEF=  60% to 65%. -(grade 2 diastolic dysfunction).- Left atrium: moderately dilated. 4/4 PCXR;Appearance of the chest most consistent with CHF with associated pleural effusions.  -Aeration is worsened 4/6 CT head WO contrast; negative for acute infarct  Culture 3/31 blood left hand positive strep viridans/DIPHTHEROIDS(CORYNEBACTERIUM SPECIES) 3/31 blood right wrist negative final  4/1 MRSA by PCR  4/4 blood right AC/left hand NGTD     Antibiotics: Vanco 06/13/2015>>4/6 Cefepime 06/13/2015 >> 4/9 Fluconazole 06/16/2015 >> Azithromycin 4/4>> 4/9    DVT prophylaxis: Lovenox   Devices    LINES / TUBES:  NA    Continuous Infusions: . sodium chloride 50 mL/hr at 06/21/15 1937  . feeding supplement (GLUCERNA 1.2 CAL) 1,000 mL (06/21/15 1842)    Objective: VITAL SIGNS: Temp: 98.3 F (36.8 C) (04/09 0336) Temp Source: Oral (04/09 0336) BP: 158/104 mmHg (04/09 0600) Pulse Rate: 110 (04/09 0600) SPO2; FIO2:   Intake/Output Summary (Last 24 hours) at 06/22/15 0749 Last data filed at 06/22/15 0710  Gross per 24 hour  Intake 5894.53 ml  Output      0 ml  Net 5894.53 ml     Exam: General: A/O 3 (does not know when), negative  acute respiratory distress Eyes: Negative headache, negative scleral hemorrhage ENT: Negative Runny nose, negative gingival bleeding, Neck:  Negative scars, masses, torticollis, lymphadenopathy, JVD Lungs: Clear to auscultation except for bilateral lower lobe rhonchi, negative wheezes or crackles Cardiovascular: Regular rate and rhythm without murmur gallop or rub normal S1 and S2 Abdomen:negative abdominal pain, nondistended, positive soft, bowel sounds, no rebound, no ascites, no appreciable mass Extremities: No significant cyanosis, clubbing, or edema  bilateral lower extremities Psychiatric:  Negative depression, negative anxiety, negative fatigue, negative mania  Neurologic:  Cranial nerves II through XII intact, tongue/uvula midline, all extremities muscle strength 3/5, sensation intact throughout, positive dysarthria, negative expressive aphasia, negative receptive aphasia.      Data Reviewed: Basic Metabolic Panel:  Recent Labs Lab 06/18/15 0316 06/19/15 0128 06/19/15 0845 06/20/15 0442 06/20/15 0927 06/21/15 0240 06/22/15 0436  NA 135 135  --   --  137 138 140  K 3.5 3.1*  --   --  3.2* 2.8* 2.5*  CL 106 104  --   --  109 108 107  CO2 18* 19*  --   --  17* 20* 21*  GLUCOSE 278* 242*  --   --  142* 241* 150*  BUN 28* 29*  --   --  29* 28* 26*  CREATININE 0.78 1.12*  --  1.13* 1.19* 1.18* 1.15*  CALCIUM 8.5* 8.5*  --   --  8.9 8.5* 8.7*  MG  --   --  1.8  --  1.6* 2.7* 1.9   Liver Function Tests:  Recent Labs Lab 06/20/15 0927 06/21/15 0240 06/22/15 0436  AST 65* 54* 51*  ALT 32 32 33  ALKPHOS 61 71 78  BILITOT 0.7 0.8 0.7  PROT 6.9 6.6 7.0  ALBUMIN 1.8* 1.6* 1.6*   No results for input(s): LIPASE, AMYLASE in the last 168 hours. No results for input(s): AMMONIA in the last 168 hours. CBC:  Recent Labs Lab 06/18/15 0316 06/19/15  0128 06/20/15 0927 06/21/15 0240 06/22/15 0436  WBC 3.1* 2.9* 5.7 3.8* 6.5  NEUTROABS  --   --  3.8 2.9 4.9  HGB 9.7* 8.7* 9.8* 9.1* 9.5*  HCT 28.8* 24.2* 29.0* 27.3* 27.9*  MCV 88.6 91.7 90.1 90.1 88.6  PLT 129* 82* 150 155 219   Cardiac Enzymes: No results for input(s): CKTOTAL, CKMB, CKMBINDEX, TROPONINI in the last 168 hours. BNP (last 3 results)  Recent Labs  05/17/15 1944  BNP 78.4    ProBNP (last 3 results) No results for input(s): PROBNP in the last 8760 hours.  CBG:  Recent Labs Lab 06/21/15 1155 06/21/15 1638 06/21/15 1943 06/22/15 0048 06/22/15 0414  GLUCAP 257* 223* 223* 137* 140*    Recent Results (from the past 240 hour(s))  Blood  Culture (routine x 2)     Status: None   Collection Time: 06/13/15  8:52 PM  Result Value Ref Range Status   Specimen Description BLOOD LEFT HAND  Final   Special Requests BOTTLES DRAWN AEROBIC AND ANAEROBIC 5CC  Final   Culture  Setup Time   Final    GRAM POSITIVE COCCI IN CHAINS ANAEROBIC BOTTLE ONLY CRITICAL RESULT CALLED TO, READ BACK BY AND VERIFIED WITH: S DOTY,RN AT 1330 06/15/15 BY L BENFIELD GRAM POSITIVE RODS AEROBIC BOTTLE ONLY CORRECTED RESULTS CALLED TO: C HANCOCK,RN AT 1046 06/17/15 BY L BENFIELD    Culture   Final    VIRIDANS STREPTOCOCCUS THE SIGNIFICANCE OF ISOLATING THIS ORGANISM FROM A SINGLE SET OF BLOOD CULTURES WHEN MULTIPLE SETS ARE DRAWN IS UNCERTAIN. PLEASE NOTIFY THE MICROBIOLOGY DEPARTMENT WITHIN ONE WEEK IF SPECIATION AND SENSITIVITIES ARE REQUIRED. DIPHTHEROIDS(CORYNEBACTERIUM SPECIES) Standardized susceptibility testing for this organism is not available.    Report Status 06/19/2015 FINAL  Final  Blood Culture (routine x 2)     Status: None   Collection Time: 06/13/15  9:04 PM  Result Value Ref Range Status   Specimen Description BLOOD RIGHT WRIST  Final   Special Requests BOTTLES DRAWN AEROBIC AND ANAEROBIC 5CC  Final   Culture NO GROWTH 5 DAYS  Final   Report Status 06/18/2015 FINAL  Final  Urine culture     Status: None   Collection Time: 06/14/15 10:00 AM  Result Value Ref Range Status   Specimen Description URINE, CATHETERIZED  Final   Special Requests NONE  Final   Culture NO GROWTH 1 DAY  Final   Report Status 06/15/2015 FINAL  Final  MRSA PCR Screening     Status: None   Collection Time: 06/14/15  2:15 PM  Result Value Ref Range Status   MRSA by PCR NEGATIVE NEGATIVE Final    Comment:        The GeneXpert MRSA Assay (FDA approved for NASAL specimens only), is one component of a comprehensive MRSA colonization surveillance program. It is not intended to diagnose MRSA infection nor to guide or monitor treatment for MRSA infections.     Culture, blood (routine x 2)     Status: None (Preliminary result)   Collection Time: 06/17/15 12:58 PM  Result Value Ref Range Status   Specimen Description BLOOD RIGHT ANTECUBITAL  Final   Special Requests IN PEDIATRIC BOTTLE  3CC  Final   Culture NO GROWTH 4 DAYS  Final   Report Status PENDING  Incomplete  Culture, blood (routine x 2)     Status: None (Preliminary result)   Collection Time: 06/17/15  1:00 PM  Result Value Ref Range Status   Specimen Description BLOOD  LEFT HAND  Final   Special Requests IN PEDIATRIC BOTTLE 3CC  Final   Culture NO GROWTH 4 DAYS  Final   Report Status PENDING  Incomplete  MRSA PCR Screening     Status: None   Collection Time: 06/17/15  6:23 PM  Result Value Ref Range Status   MRSA by PCR NEGATIVE NEGATIVE Final    Comment:        The GeneXpert MRSA Assay (FDA approved for NASAL specimens only), is one component of a comprehensive MRSA colonization surveillance program. It is not intended to diagnose MRSA infection nor to guide or monitor treatment for MRSA infections.      Studies:  Recent x-ray studies have been reviewed in detail by the Attending Physician  Scheduled Meds:  Scheduled Meds: . amiodarone  200 mg Per NG tube BID  . antiseptic oral rinse  7 mL Mouth Rinse q12n4p  . azithromycin  500 mg Intravenous Q24H  . budesonide (PULMICORT) nebulizer solution  0.25 mg Nebulization BID  . ceFEPime (MAXIPIME) IV  2 g Intravenous Q24H  . chlorhexidine  15 mL Mouth Rinse BID  . diltiazem  180 mg Oral Daily  . enoxaparin (LOVENOX) injection  40 mg Subcutaneous Q24H  . fluconazole (DIFLUCAN) IV  200 mg Intravenous Q24H  . furosemide  60 mg Oral BID  . hydrocortisone sod succinate (SOLU-CORTEF) inj  50 mg Intravenous Daily  . insulin aspart  0-20 Units Subcutaneous 6 times per day  . insulin aspart  10 Units Subcutaneous TID WC  . insulin glargine  10 Units Subcutaneous Daily  . levothyroxine  75 mcg Intravenous Daily  . potassium  chloride  10 mEq Intravenous Q1 Hr x 4  . sodium chloride flush  3 mL Intravenous Q12H    Time spent on care of this patient: 40 mins   Benny Henrie, Geraldo Docker , MD  [facility] Office  [phone] Pager - [phone]  On-Call/Text Page:      [email or web address]      password [email or web address]  If 7PM-7AM, please contact night-coverage [email or web address] Password [email or web address] 06/22/2015, 7:49 AM   LOS: 9 days   Care during the described time interval was provided by me .  I have reviewed this patient's available data, including medical history, events of note, physical examination, and all test results as part of my evaluation. I have personally reviewed and interpreted all radiology studies.   Dia Crawford, MD [phone] Pager

## 2015-06-22 NOTE — Progress Notes (Signed)
CRITICAL VALUE ALERT  Critical value received:  K+ 2.5  Date of notification:  06/21/15  Time of notification:  0610  Critical value read back: yes  Nurse who received alert:  A. Whitlow RN  MD notified (1st page):  M. Donnal Debar NP  Time of first page:  0615  MD notified (2nd page):  Time of second page:  Responding MD:  See [facility]  Time MD responded:  [phone]

## 2015-06-23 ENCOUNTER — Other Ambulatory Visit (HOSPITAL_COMMUNITY): Payer: Self-pay

## 2015-06-23 ENCOUNTER — Inpatient Hospital Stay
Admission: AD | Admit: 2015-06-23 | Discharge: 2015-07-20 | Disposition: A | Discharge status: Skilled Nursing Facility | Payer: Medicare Other | Source: Ambulatory Visit | Attending: Internal Medicine | Admitting: Internal Medicine

## 2015-06-23 DIAGNOSIS — Z95828 Presence of other vascular implants and grafts: Secondary | ICD-10-CM

## 2015-06-23 DIAGNOSIS — J154 Pneumonia due to other streptococci: Secondary | ICD-10-CM | POA: Diagnosis present

## 2015-06-23 DIAGNOSIS — Z4659 Encounter for fitting and adjustment of other gastrointestinal appliance and device: Secondary | ICD-10-CM

## 2015-06-23 DIAGNOSIS — R7401 Elevation of levels of liver transaminase levels: Secondary | ICD-10-CM

## 2015-06-23 DIAGNOSIS — Z978 Presence of other specified devices: Secondary | ICD-10-CM

## 2015-06-23 DIAGNOSIS — I10 Essential (primary) hypertension: Secondary | ICD-10-CM | POA: Diagnosis present

## 2015-06-23 DIAGNOSIS — J189 Pneumonia, unspecified organism: Secondary | ICD-10-CM

## 2015-06-23 DIAGNOSIS — Z931 Gastrostomy status: Secondary | ICD-10-CM

## 2015-06-23 DIAGNOSIS — R609 Edema, unspecified: Secondary | ICD-10-CM

## 2015-06-23 DIAGNOSIS — R1310 Dysphagia, unspecified: Secondary | ICD-10-CM

## 2015-06-23 DIAGNOSIS — D638 Anemia in other chronic diseases classified elsewhere: Secondary | ICD-10-CM | POA: Diagnosis present

## 2015-06-23 DIAGNOSIS — E039 Hypothyroidism, unspecified: Secondary | ICD-10-CM | POA: Diagnosis present

## 2015-06-23 DIAGNOSIS — I4891 Unspecified atrial fibrillation: Secondary | ICD-10-CM | POA: Diagnosis present

## 2015-06-23 DIAGNOSIS — E1165 Type 2 diabetes mellitus with hyperglycemia: Secondary | ICD-10-CM | POA: Diagnosis present

## 2015-06-23 DIAGNOSIS — J9601 Acute respiratory failure with hypoxia: Secondary | ICD-10-CM | POA: Diagnosis present

## 2015-06-23 DIAGNOSIS — IMO0002 Reserved for concepts with insufficient information to code with codable children: Secondary | ICD-10-CM | POA: Diagnosis present

## 2015-06-23 DIAGNOSIS — J9690 Respiratory failure, unspecified, unspecified whether with hypoxia or hypercapnia: Secondary | ICD-10-CM

## 2015-06-23 DIAGNOSIS — I5032 Chronic diastolic (congestive) heart failure: Secondary | ICD-10-CM | POA: Diagnosis present

## 2015-06-23 DIAGNOSIS — G9340 Encephalopathy, unspecified: Secondary | ICD-10-CM | POA: Diagnosis present

## 2015-06-23 DIAGNOSIS — R092 Respiratory arrest: Secondary | ICD-10-CM

## 2015-06-23 DIAGNOSIS — R740 Nonspecific elevation of levels of transaminase and lactic acid dehydrogenase [LDH]: Secondary | ICD-10-CM

## 2015-06-23 DIAGNOSIS — I469 Cardiac arrest, cause unspecified: Secondary | ICD-10-CM

## 2015-06-23 DIAGNOSIS — J90 Pleural effusion, not elsewhere classified: Secondary | ICD-10-CM

## 2015-06-23 LAB — GLUCOSE, CAPILLARY
GLUCOSE-CAPILLARY: 125 mg/dL — AB (ref 65–99)
GLUCOSE-CAPILLARY: 146 mg/dL — AB (ref 65–99)
GLUCOSE-CAPILLARY: 205 mg/dL — AB (ref 65–99)
Glucose-Capillary: 164 mg/dL — ABNORMAL HIGH (ref 65–99)
Glucose-Capillary: 167 mg/dL — ABNORMAL HIGH (ref 65–99)
Glucose-Capillary: 209 mg/dL — ABNORMAL HIGH (ref 65–99)
Glucose-Capillary: 218 mg/dL — ABNORMAL HIGH (ref 65–99)
Glucose-Capillary: 69 mg/dL (ref 65–99)

## 2015-06-23 LAB — COMPREHENSIVE METABOLIC PANEL
ALK PHOS: 74 U/L (ref 38–126)
ALT: 35 U/L (ref 14–54)
AST: 49 U/L — ABNORMAL HIGH (ref 15–41)
Albumin: 1.7 g/dL — ABNORMAL LOW (ref 3.5–5.0)
Anion gap: 10 (ref 5–15)
BUN: 28 mg/dL — ABNORMAL HIGH (ref 6–20)
CALCIUM: 8.7 mg/dL — AB (ref 8.9–10.3)
CO2: 24 mmol/L (ref 22–32)
Chloride: 108 mmol/L (ref 101–111)
Creatinine, Ser: 1.15 mg/dL — ABNORMAL HIGH (ref 0.44–1.00)
GFR, EST AFRICAN AMERICAN: 55 mL/min — AB (ref >60)
GFR, EST NON AFRICAN AMERICAN: 48 mL/min — AB (ref >60)
GLUCOSE: 144 mg/dL — AB (ref 65–99)
Potassium: 3 mmol/L — ABNORMAL LOW (ref 3.5–5.1)
Sodium: 142 mmol/L (ref 135–145)
TOTAL PROTEIN: 6.9 g/dL (ref 6.5–8.1)
Total Bilirubin: 0.6 mg/dL (ref 0.3–1.2)

## 2015-06-23 LAB — CBC WITH DIFFERENTIAL/PLATELET
BASOS ABS: 0 10*3/uL (ref 0.0–0.1)
BASOS PCT: 0 %
Eosinophils Absolute: 0 10*3/uL (ref 0.0–0.7)
Eosinophils Relative: 0 %
HEMATOCRIT: 28.3 % — AB (ref 36.0–46.0)
Hemoglobin: 9.5 g/dL — ABNORMAL LOW (ref 12.0–15.0)
LYMPHS PCT: 16 %
Lymphs Abs: 1 10*3/uL (ref 0.7–4.0)
MCH: 29.8 pg (ref 26.0–34.0)
MCHC: 33.6 g/dL (ref 30.0–36.0)
MCV: 88.7 fL (ref 78.0–100.0)
MONO ABS: 0.4 10*3/uL (ref 0.1–1.0)
Monocytes Relative: 7 %
NEUTROS ABS: 4.9 10*3/uL (ref 1.7–7.7)
NEUTROS PCT: 77 %
PLATELETS: 246 10*3/uL (ref 150–400)
RBC: 3.19 MIL/uL — AB (ref 3.87–5.11)
RDW: 17.8 % — AB (ref 11.5–15.5)
WBC: 6.4 10*3/uL (ref 4.0–10.5)

## 2015-06-23 LAB — BLOOD GAS, ARTERIAL
Acid-Base Excess: 2.3 mmol/L — ABNORMAL HIGH (ref 0.0–2.0)
BICARBONATE: 24.9 meq/L — AB (ref 20.0–24.0)
Drawn by: 448981
O2 Content: 4 L/min
O2 SAT: 97.5 %
PH ART: 7.541 — AB (ref 7.350–7.450)
PO2 ART: 84.1 mmHg (ref 80.0–100.0)
Patient temperature: 98.6
TCO2: 25.8 mmol/L (ref 0–100)
pCO2 arterial: 29.1 mmHg — ABNORMAL LOW (ref 35.0–45.0)

## 2015-06-23 LAB — AMMONIA: AMMONIA: 24 umol/L (ref 9–35)

## 2015-06-23 LAB — FOLATE: FOLATE: 28.2 ng/mL (ref >5.9)

## 2015-06-23 LAB — VITAMIN B12: VITAMIN B 12: 1806 pg/mL — AB (ref 180–914)

## 2015-06-23 LAB — MAGNESIUM: Magnesium: 1.8 mg/dL (ref 1.7–2.4)

## 2015-06-23 MED ORDER — INSULIN GLARGINE 100 UNIT/ML ~~LOC~~ SOLN: 12.0000 [IU] | Freq: Every day | SUBCUTANEOUS | Status: DC

## 2015-06-23 MED ORDER — ACETAMINOPHEN 650 MG RE SUPP: 650.0000 mg | RECTAL | Status: DC | PRN

## 2015-06-23 MED ORDER — CETYLPYRIDINIUM CHLORIDE 0.05 % MT LIQD: 7.0000 mL | Freq: Two times a day (BID) | OROMUCOSAL | Status: DC

## 2015-06-23 MED ORDER — CARVEDILOL 25 MG PO TABS: 25.0000 mg | ORAL_TABLET | Freq: Two times a day (BID) | ORAL | Status: DC

## 2015-06-23 MED ORDER — HYDRALAZINE HCL 20 MG/ML IJ SOLN
5.0000 mg | Freq: Four times a day (QID) | INTRAMUSCULAR | Status: DC | PRN
  Administered 2015-06-23: 5 mg via INTRAVENOUS
  Filled 2015-06-23: qty 1

## 2015-06-23 MED ORDER — CARVEDILOL 25 MG PO TABS
25.0000 mg | ORAL_TABLET | Freq: Two times a day (BID) | ORAL | Status: DC
  Filled 2015-06-23: qty 1

## 2015-06-23 MED ORDER — DEXTROSE 50 % IV SOLN
1.0000 | Freq: Once | INTRAVENOUS | Status: AC
  Administered 2015-06-23: 50 mL via INTRAVENOUS

## 2015-06-23 MED ORDER — INSULIN GLARGINE 100 UNIT/ML ~~LOC~~ SOLN
12.0000 [IU] | Freq: Every day | SUBCUTANEOUS | Status: DC
  Administered 2015-06-23: 12 [IU] via SUBCUTANEOUS
  Filled 2015-06-23: qty 0.12

## 2015-06-23 MED ORDER — DILTIAZEM 12 MG/ML ORAL SUSPENSION: 60.0000 mg | Freq: Three times a day (TID) | ORAL | Status: DC

## 2015-06-23 MED ORDER — SODIUM CHLORIDE 0.9% FLUSH: 3.0000 mL | Freq: Two times a day (BID) | INTRAVENOUS | Status: DC

## 2015-06-23 MED ORDER — POTASSIUM CHLORIDE 20 MEQ/15ML (10%) PO SOLN: 40.0000 meq | Freq: Two times a day (BID) | ORAL | Status: DC

## 2015-06-23 MED ORDER — CARVEDILOL 25 MG PO TABS
25.0000 mg | ORAL_TABLET | Freq: Two times a day (BID) | ORAL | Status: DC
  Administered 2015-06-23: 25 mg

## 2015-06-23 MED ORDER — ACETAMINOPHEN 160 MG/5ML PO SOLN: 650.0000 mg | Freq: Four times a day (QID) | ORAL | Status: DC | PRN

## 2015-06-23 MED ORDER — INSULIN ASPART 100 UNIT/ML ~~LOC~~ SOLN: 12.0000 [IU] | Freq: Three times a day (TID) | SUBCUTANEOUS | Status: DC

## 2015-06-23 MED ORDER — CARVEDILOL 12.5 MG PO TABS: 12.5000 mg | ORAL_TABLET | Freq: Two times a day (BID) | ORAL | Status: DC

## 2015-06-23 MED ORDER — DILTIAZEM 12 MG/ML ORAL SUSPENSION
60.0000 mg | Freq: Three times a day (TID) | ORAL | Status: DC
Start: 2015-06-23 — End: 2015-06-23
  Filled 2015-06-23 (×3): qty 6

## 2015-06-23 MED ORDER — FUROSEMIDE 40 MG PO TABS
40.0000 mg | ORAL_TABLET | Freq: Two times a day (BID) | ORAL | Status: DC
  Administered 2015-06-23: 40 mg
  Filled 2015-06-23: qty 1

## 2015-06-23 MED ORDER — DEXTROSE 50 % IV SOLN
INTRAVENOUS | Status: AC
  Administered 2015-06-23: 01:00:00
  Filled 2015-06-23: qty 50

## 2015-06-23 MED ORDER — INSULIN ASPART 100 UNIT/ML ~~LOC~~ SOLN
0.0000 [IU] | SUBCUTANEOUS | Status: DC
  Administered 2015-06-23: 2 [IU] via SUBCUTANEOUS
  Administered 2015-06-23 (×2): 3 [IU] via SUBCUTANEOUS

## 2015-06-23 MED ORDER — INSULIN ASPART 100 UNIT/ML ~~LOC~~ SOLN: 0.0000 [IU] | SUBCUTANEOUS | Status: DC

## 2015-06-23 MED ORDER — LEVOTHYROXINE SODIUM 100 MCG IV SOLR: 75.0000 ug | Freq: Every day | INTRAVENOUS | Status: DC

## 2015-06-23 MED ORDER — ONDANSETRON HCL 4 MG/2ML IJ SOLN: 4.0000 mg | Freq: Four times a day (QID) | INTRAMUSCULAR | Status: DC | PRN

## 2015-06-23 MED ORDER — ENOXAPARIN SODIUM 40 MG/0.4ML ~~LOC~~ SOLN: 40.0000 mg | SUBCUTANEOUS | Status: DC

## 2015-06-23 MED ORDER — CHLORHEXIDINE GLUCONATE 0.12 % MT SOLN: 15.0000 mL | Freq: Two times a day (BID) | OROMUCOSAL | Status: DC

## 2015-06-23 MED ORDER — POTASSIUM CHLORIDE 20 MEQ/15ML (10%) PO SOLN
40.0000 meq | Freq: Two times a day (BID) | ORAL | Status: DC
  Administered 2015-06-23: 40 meq
  Filled 2015-06-23: qty 30

## 2015-06-23 MED ORDER — FUROSEMIDE 40 MG PO TABS: 40.0000 mg | ORAL_TABLET | Freq: Two times a day (BID) | ORAL | Status: DC

## 2015-06-23 MED ORDER — PREDNISONE 20 MG PO TABS: 40.0000 mg | ORAL_TABLET | Freq: Every day | ORAL | Status: DC

## 2015-06-23 MED ORDER — POTASSIUM CHLORIDE 20 MEQ/15ML (10%) PO SOLN
40.0000 meq | Freq: Once | ORAL | Status: AC
  Administered 2015-06-23: 40 meq
  Filled 2015-06-23: qty 30

## 2015-06-23 MED ORDER — GLUCERNA 1.2 CAL PO LIQD: 1000.0000 mL | ORAL | Status: DC

## 2015-06-23 NOTE — Care Management Note (Signed)
Case Management Note  Patient Details  Name: Emily Kennedy MRN: [id number] Date of Birth: 1945/06/24  Subjective/Objective:   Pt admitted with acute resp failure                 Action/Plan:  Pt is from home with daughter - pt has been referred for [facility]  CM consulted both attending and physician adivisor; both resources are in agreement with this discharge plan.  [facility] referral to both [facility].  CM spoke with pts daughter Arbie Cookey and offerred choice pt declined meeting with [facility] - stated she knew she wanted [facility].  Arbie Cookey agreed to having liason contact her via phone.     Expected Discharge Date:                  Expected Discharge Plan:  Skilled Nursing Facility  In-House Referral:  Clinical Social Work  Discharge planning Services  CM Consult  Post Acute Care Choice:    Choice offered to:     DME Arranged:    DME Agency:     HH Arranged:    [place] Agency:     Status of Service:  In process, will continue to follow  Medicare Important Message Given:  Yes Date Medicare IM Given:    Medicare IM give by:    Date Additional Medicare IM Given:    Additional Medicare Important Message give by:     If discussed at [place] of Stay Meetings, dates discussed:    Additional Comments: Pt discharged to [facility] 06/23/15 Maryclare Labrador, RN 06/23/2015, 4:12 PM

## 2015-06-23 NOTE — Progress Notes (Signed)
[place] [facility] TEAM 1 - Stepdown/ICU TEAM PROGRESS NOTE  Lexiann Zopfi [id number] DOB: 06-Jan-1946 DOA: 06/13/2015 PCP: [name], MD  Admit HPI / Brief Narrative: 70 y.o. F Hx MGUS, HTN, Chronic Diastolic CHF, DM2, and Leukocytoclastic Vasculitis who presented with altered mental status, cough, weakness, and poor appetite for 4 days.  In the ED pt was hypoxic requiring NRB, febrile to 102.60F, tachycardic, and tachypneic. Blood work showed sodium of 129, lactate normal. CXR showed a dense right diffuse opacity. Pt started on vanco and cefepime for HCAP treatment. Strep pneumonia and Legionella screens were positive.    HPI/Subjective: Pt is unresponsive.  There is no family present at the time of my exam.    Assessment/Plan:  Sepsis and Acute hypoxic respiratory failure with diffuse R lung Streptococcus and Legionella pneumonia - Streptococcus pneumonia antigen positive. Legionella antigen positive  - Influenza negative   Acute toxic encephalopathy / altered mental status -mental status continues to wax and wane - CT head 4/6 w/o acute findings - suspect TME - complete metabolic w/u - TSH 2.8 - check ABG  Transient Atrial fibrillation with RVR - Sinus Tachy -CHA2DS2 VASc score 4, but short term afib only and converted back to sinus tachy - Cards suggested stopping anticoag  -transiently on amio IV  - 2 D Echo from 05/19/15 showed normal EF with grade 2 diastolic dysfunction, elevated filling pressures and moderately dilated left atrium -pt previously on high dose Coreg - resume coreg - 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  -Swallow study when patient can follow commands - cont NG tube feeds for now - may require PEG soon if mental status does not improve   Hyponatremia - Resolved with IV fluids  Hypokalemia - continue to replace toward goal of 4.0 - Mg normal   Diabetes mellitus type 2 - CBGs reasonably well controlled - follow   Anemia, chronic disease - Due to history of MGUS (on chronic steroid) - Hemoglobin stable   Hypothyroidism -TSH= 2.8 -Continue synthroid  Code Status: FULL Family Communication: no family present at time of exam Disposition Plan: possible transfer to [facility]  Consultants: [facility]   Procedures: 3/6 TTE - Mild LVH - EF 60-65% - grade 2 diastolic dysfunction - Left atrium: moderately dilated  Antibiotics: Vanco 3/31 > 4/4 Cefepime 3/31 > 4/08 Fluconazole 4/3 > 4/09 Azithromycin 4/4 > 4/8  DVT prophylaxis: lovenox  Objective: Blood pressure 132/75, pulse 125, temperature 99.2 F (37.3 C), temperature source Axillary, resp. rate 25, height 5\' 4"  (1.626 m), weight 60.6 kg (133 lb 9.6 oz), SpO2 93 %.  Intake/Output Summary (Last 24 hours) at 06/23/15 1107 Last data filed at 06/23/15 0913  Gross per 24 hour  Intake   2019 ml  Output   1825 ml  Net    194 ml   Exam: General: No acute respiratory distress -  obtunded / lethargic  Lungs: mild bibasilar crackles - no wheeze  Cardiovascular: tachycardic but regular - no M, G, R Abdomen: Nondistended, soft, bowel sounds positive, no rebound, no ascites, no appreciable mass Extremities: No significant cyanosis, clubbing, or edema bilateral lower extremities  Data Reviewed:  Basic Metabolic Panel:  Recent Labs Lab 06/19/15 0128  06/20/15 0442 06/20/15 0927 06/21/15 0240  06/22/15 0436 06/22/15 1243 06/23/15 0241  NA 135  --   --  137 138 140  --  142  K 3.1*  --   --  3.2* 2.8* 2.5* 3.1* 3.0*  CL 104  --   --  109 108 107  --  108  CO2 19*  --   --  17* 20* 21*  --  24  GLUCOSE 242*  --   --  142* 241* 150*  --  144*  BUN 29*  --   --  29* 28* 26*  --  28*  CREATININE 1.12*  --  1.13* 1.19* 1.18* 1.15*  --  1.15*  CALCIUM 8.5*  --   --  8.9 8.5* 8.7*  --  8.7*  MG  --   < >  --  1.6* 2.7* 1.9 1.8 1.8  < > = values in this interval not displayed.  CBC:  Recent Labs Lab 06/19/15 0128 06/20/15 0927 06/21/15 0240 06/22/15 0436 06/23/15 0241  WBC 2.9* 5.7 3.8* 6.5 6.4  NEUTROABS  --  3.8 2.9 4.9 4.9  HGB 8.7* 9.8* 9.1* 9.5* 9.5*  HCT 24.2* 29.0* 27.3* 27.9* 28.3*  MCV 91.7 90.1 90.1 88.6 88.7  PLT 82* 150 155 219 246    Liver Function Tests:  Recent Labs Lab 06/20/15 0927 06/21/15 0240 06/22/15 0436 06/23/15 0241  AST 65* 54* 51* 49*  ALT 32 32 33 35  ALKPHOS 61 71 78 74  BILITOT 0.7 0.8 0.7 0.6  PROT 6.9 6.6 7.0 6.9  ALBUMIN 1.8* 1.6* 1.6* 1.7*   CBG:  Recent Labs Lab 06/23/15 0005 06/23/15 0112 06/23/15 0221 06/23/15 0419 06/23/15 0724  GLUCAP 69 164* 125* 146* 205*    Recent Results (from the past 240 hour(s))  Blood Culture (routine x 2)     Status: None   Collection Time: 06/13/15  8:52 PM  Result Value Ref Range Status   Specimen Description BLOOD LEFT HAND  Final   Special Requests BOTTLES DRAWN AEROBIC AND ANAEROBIC 5CC  Final   Culture  Setup Time   Final    GRAM POSITIVE COCCI IN CHAINS ANAEROBIC BOTTLE ONLY CRITICAL RESULT CALLED TO, READ BACK BY AND VERIFIED WITH: S DOTY,RN AT 1330 06/15/15 BY L BENFIELD GRAM POSITIVE RODS AEROBIC BOTTLE ONLY CORRECTED RESULTS CALLED TO: C HANCOCK,RN AT 1046 06/17/15 BY L BENFIELD    Culture   Final    VIRIDANS STREPTOCOCCUS THE SIGNIFICANCE OF ISOLATING THIS ORGANISM FROM A SINGLE SET OF BLOOD CULTURES WHEN MULTIPLE SETS ARE DRAWN IS UNCERTAIN. PLEASE NOTIFY THE  MICROBIOLOGY DEPARTMENT WITHIN ONE WEEK IF SPECIATION AND SENSITIVITIES ARE REQUIRED. DIPHTHEROIDS(CORYNEBACTERIUM SPECIES) Standardized susceptibility testing for this organism is not available.    Report Status 06/19/2015 FINAL  Final  Blood Culture (routine x 2)     Status: None   Collection Time: 06/13/15  9:04 PM  Result Value Ref Range Status   Specimen Description BLOOD RIGHT WRIST  Final   Special Requests BOTTLES DRAWN AEROBIC AND ANAEROBIC 5CC  Final   Culture NO GROWTH 5 DAYS  Final   Report Status 06/18/2015  FINAL  Final  Urine culture     Status: None   Collection Time: 06/14/15 10:00 AM  Result Value Ref Range Status   Specimen Description URINE, CATHETERIZED  Final   Special Requests NONE  Final   Culture NO GROWTH 1 DAY  Final   Report Status 06/15/2015 FINAL  Final  MRSA PCR Screening     Status: None   Collection Time: 06/14/15  2:15 PM  Result Value Ref Range Status   MRSA by PCR NEGATIVE NEGATIVE Final    Comment:        The GeneXpert MRSA Assay (FDA approved for NASAL specimens only), is one component of a comprehensive MRSA colonization surveillance program. It is not intended to diagnose MRSA infection nor to guide or monitor treatment for MRSA infections.   Culture, blood (routine x 2)     Status: None   Collection Time: 06/17/15 12:58 PM  Result Value Ref Range Status   Specimen Description BLOOD RIGHT ANTECUBITAL  Final   Special Requests IN PEDIATRIC BOTTLE  3CC  Final   Culture NO GROWTH 5 DAYS  Final   Report Status 06/22/2015 FINAL  Final  Culture, blood (routine x 2)     Status: None   Collection Time: 06/17/15  1:00 PM  Result Value Ref Range Status   Specimen Description BLOOD LEFT HAND  Final   Special Requests IN PEDIATRIC BOTTLE 3CC  Final   Culture NO GROWTH 5 DAYS  Final   Report Status 06/22/2015 FINAL  Final  MRSA PCR Screening     Status: None   Collection Time: 06/17/15  6:23 PM  Result Value Ref Range Status   MRSA by PCR  NEGATIVE NEGATIVE Final    Comment:        The GeneXpert MRSA Assay (FDA approved for NASAL specimens only), is one component of a comprehensive MRSA colonization surveillance program. It is not intended to diagnose MRSA infection nor to guide or monitor treatment for MRSA infections.      Studies:   Recent x-ray studies have been reviewed in detail by the Attending Physician  Scheduled Meds:  Scheduled Meds: . amiodarone  300 mg Per NG tube BID  . antiseptic oral rinse  7 mL Mouth Rinse q12n4p  . budesonide (PULMICORT) nebulizer solution  0.25 mg Nebulization BID  . carvedilol  12.5 mg Per Tube BID  . chlorhexidine  15 mL Mouth Rinse BID  . diltiazem  240 mg Oral Daily  . enoxaparin (LOVENOX) injection  40 mg Subcutaneous Q24H  . furosemide  80 mg Oral BID  . hydrocortisone sod succinate (SOLU-CORTEF) inj  50 mg Intravenous Daily  . insulin aspart  0-20 Units Subcutaneous 6 times per day  . insulin aspart  12 Units Subcutaneous TID WC  . insulin glargine  12 Units Subcutaneous Daily  . levothyroxine  75 mcg Intravenous Daily  . sodium chloride flush  3 mL Intravenous Q12H    Time spent on care of this patient: 35 mins   Genesis Paget T , MD   [facility] Office  [phone] Pager - Text Page per Shea Evans as per below:  On-Call/Text Page:      [email or web address]      password [email or web address]  If 7PM-7AM, please contact night-coverage [email or web address] Password [email or web address] 06/23/2015, 11:07 AM   LOS: 10 days

## 2015-06-23 NOTE — Discharge Summary (Signed)
DISCHARGE SUMMARY  Aden Aneeka Jen  MR#: [id number]  DOB:11/07/1945  Date of Admission: 06/13/2015 Date of Discharge: 06/23/2015  Attending Physician:Hailyn Zarr T  Patient's [id number], MD  Consults: [facility]   Disposition: [facility] to [facility]  Follow-up Appts: To be scheduled upon d/c from [facility]  Discharge Diagnoses: Sepsis and Acute hypoxic respiratory failure with diffuse R lung Streptococcus and Legionella pneumonia Acute toxic encephalopathy / altered mental status Transient Atrial fibrillation with RVR - Sinus Tachy Acute on Chronic Diastolic CHF Essential hypertension Blood cx + for Strep viridans + Diptheroids - 1 of 2 Thrush Dysphagia Hyponatremia Hypokalemia Diabetes mellitus type 2 Anemia, chronic disease Hypothyroidism      Initial presentation: 70 y.o. F Hx MGUS, HTN, Chronic Diastolic CHF, DM2, and Leukocytoclastic Vasculitis who presented with altered mental status, cough, weakness, and poor appetite for 4 days.  In the ED pt was hypoxic requiring NRB, febrile to 102.59F, tachycardic, and tachypneic. Blood work showed sodium of 129, lactate normal. CXR showed a dense right diffuse opacity. Pt started on vanco and cefepime for HCAP treatment. Strep pneumonia and Legionella screens were positive.   Hospital Course:  Sepsis and Acute hypoxic respiratory failure with diffuse R lung Streptococcus and Legionella pneumonia - Streptococcus pneumonia antigen positive & Legionella antigen positive  - Influenza negative  -has completed a full abx course   Acute toxic encephalopathy / altered mental status -mental status continues to wax and wane - CT head 4/6 w/o acute findings - suspect TME - complete metabolic w/u unrevealing - TSH 2.8 - ABG unrevealing   Transient Atrial fibrillation with RVR - Sinus Tachy -CHA2DS2 VASc score 4, but short term afib only and converted back to sinus tachy - Cards suggested stopping anticoag    -transiently on amio IV  - 2 D Echo from 05/19/15 showed normal EF with grade 2 diastolic dysfunction, elevated filling pressures and moderately dilated left atrium -pt previously on high dose Coreg - resume coreg - likely a component of BB withdrawal   Acute on Chronic Diastolic CHF -grade 2 diastolic dysfunction - no gross evidence of signif volume overload - follow strict Is/Os and weights  Filed Weights   06/21/15 0444 06/22/15 0354 06/23/15 0533  Weight: 62.3 kg (137 lb 5.6 oz) 61.3 kg (135 lb 2.3 oz) 60.6 kg (133 lb 9.6 oz)   Essential hypertension -BP remains poorly controlled - titrating med regimen at time of transfer to [facility]  Blood cx + for Strep viridans + Diptheroids - 1 of 2 -likely a contaminant - f/u blood cultures both negative   Thrush -completed a course of fluconazole  Dysphagia -RN staff and son noted patient appeared to be aspirating.  -Swallow study when patient can follow commands - cont NG tube feeds for now - may require PEG soon if mental status does not improve   Hyponatremia - Resolved with IV fluids  Hypokalemia - continue to replace toward goal of 4.0 - Mg normal   Diabetes mellitus type 2 - CBGs reasonably well controlled - follow   Anemia, chronic disease - Due to history of MGUS (on chronic steroid) - Hemoglobin stable   Hypothyroidism -TSH 2.8 -Continue synthroid        Medication List    STOP taking these medications        acetaminophen 650 MG CR tablet  Commonly known as:  TYLENOL  Replaced by:  acetaminophen 160 MG/5ML solution     aspirin EC 81 MG tablet     [employer]  GLUCOS-CHONDROIT TRIPLE ST Tabs     DULoxetine 30 MG capsule  Commonly known as:  CYMBALTA     hydrALAZINE 25 MG tablet  Commonly known as:  APRESOLINE     levothyroxine 150 MCG tablet  Commonly known as:  SYNTHROID, LEVOTHROID  Replaced by:  levothyroxine 100 MCG Solr injection     levothyroxine 75 MCG tablet  Commonly known as:  SYNTHROID,  LEVOTHROID     metFORMIN 1000 MG tablet  Commonly known as:  GLUCOPHAGE      TAKE these medications        acetaminophen 160 MG/5ML solution  Commonly known as:  TYLENOL  Place 20.3 mLs (650 mg total) into feeding tube every 6 (six) hours as needed for mild pain, headache or fever.     acetaminophen 650 MG suppository  Commonly known as:  TYLENOL  Place 1 suppository (650 mg total) rectally every 4 (four) hours as needed for mild pain or fever.     antiseptic oral rinse 0.05 % Liqd solution  Commonly known as:  CPC / CETYLPYRIDINIUM CHLORIDE 0.05%  7 mLs by Mouth Rinse route 2 times daily at 12 noon and 4 pm.     carvedilol 25 MG tablet  Commonly known as:  COREG  Place 1 tablet (25 mg total) into feeding tube 2 (two) times daily.     chlorhexidine 0.12 % solution  Commonly known as:  PERIDEX  15 mLs by Mouth Rinse route 2 (two) times daily.     diltiazem 10 mg/ml oral suspension  Commonly known as:  CARDIZEM  Place 6 mLs (60 mg total) into feeding tube every 8 (eight) hours.     enoxaparin 40 MG/0.4ML injection  Commonly known as:  LOVENOX  Inject 0.4 mLs (40 mg total) into the skin daily.     feeding supplement (GLUCERNA 1.2 CAL) Liqd  Place 1,000 mLs into feeding tube continuous.     furosemide 40 MG tablet  Commonly known as:  LASIX  Place 1 tablet (40 mg total) into feeding tube 2 (two) times daily.     insulin aspart 100 UNIT/ML injection  Commonly known as:  novoLOG  Inject 0-9 Units into the skin every 4 (four) hours.     insulin glargine 100 UNIT/ML injection  Commonly known as:  LANTUS  Inject 0.12 mLs (12 Units total) into the skin daily.     levothyroxine 100 MCG Solr injection  Commonly known as:  SYNTHROID, LEVOTHROID  Inject 3.75 mLs (75 mcg total) into the vein daily.     ondansetron 4 MG/2ML Soln injection  Commonly known as:  ZOFRAN  Inject 2 mLs (4 mg total) into the vein every 6 (six) hours as needed for nausea.     potassium chloride 20  MEQ/15ML (10%) Soln  Place 30 mLs (40 mEq total) into feeding tube 2 (two) times daily.     predniSONE 20 MG tablet  Commonly known as:  DELTASONE  Place 2 tablets (40 mg total) into feeding tube daily with breakfast.  Start taking on:  06/24/2015     sodium chloride flush 0.9 % Soln  Commonly known as:  NS  Inject 3 mLs into the vein every 12 (twelve) hours.       Day of Discharge BP 149/100 mmHg  Pulse 98  Temp(Src) 99.2 F (37.3 C) (Axillary)  Resp 25  Ht 5\' 4"  (1.626 m)  Wt 60.6 kg (133 lb 9.6 oz)  BMI 22.92 kg/m2  SpO2 93%  Physical Exam: General: No acute respiratory distress - obtunded / lethargic  Lungs: mild bibasilar crackles - no wheeze  Cardiovascular: tachycardic but regular - no M, G, R Abdomen: Nondistended, soft, bowel sounds positive, no rebound, no ascites, no appreciable mass Extremities: No significant cyanosis, clubbing, or edema bilateral lower extremities  Basic Metabolic Panel:  Recent Labs Lab 06/19/15 0128  06/20/15 0442 06/20/15 0927 06/21/15 0240 06/22/15 0436 06/22/15 1243 06/23/15 0241  NA 135  --   --  137 138 140  --  142  K 3.1*  --   --  3.2* 2.8* 2.5* 3.1* 3.0*  CL 104  --   --  109 108 107  --  108  CO2 19*  --   --  17* 20* 21*  --  24  GLUCOSE 242*  --   --  142* 241* 150*  --  144*  BUN 29*  --   --  29* 28* 26*  --  28*  CREATININE 1.12*  --  1.13* 1.19* 1.18* 1.15*  --  1.15*  CALCIUM 8.5*  --   --  8.9 8.5* 8.7*  --  8.7*  MG  --   < >  --  1.6* 2.7* 1.9 1.8 1.8  < > = values in this interval not displayed.  Liver Function Tests:  Recent Labs Lab 06/20/15 0927 06/21/15 0240 06/22/15 0436 06/23/15 0241  AST 65* 54* 51* 49*  ALT 32 32 33 35  ALKPHOS 61 71 78 74  BILITOT 0.7 0.8 0.7 0.6  PROT 6.9 6.6 7.0 6.9  ALBUMIN 1.8* 1.6* 1.6* 1.7*    Recent Labs Lab 06/23/15 1158  AMMONIA 24   CBC:  Recent Labs Lab 06/19/15 0128 06/20/15 0927 06/21/15 0240 06/22/15 0436 06/23/15 0241  WBC 2.9* 5.7 3.8*  6.5 6.4  NEUTROABS  --  3.8 2.9 4.9 4.9  HGB 8.7* 9.8* 9.1* 9.5* 9.5*  HCT 24.2* 29.0* 27.3* 27.9* 28.3*  MCV 91.7 90.1 90.1 88.6 88.7  PLT 82* 150 155 219 246   CBG:  Recent Labs Lab 06/23/15 0112 06/23/15 0221 06/23/15 0419 06/23/15 0724 06/23/15 1234  GLUCAP 164* 125* 146* 205* 167*    Recent Results (from the past 240 hour(s))  Blood Culture (routine x 2)     Status: None   Collection Time: 06/13/15  8:52 PM  Result Value Ref Range Status   Specimen Description BLOOD LEFT HAND  Final   Special Requests BOTTLES DRAWN AEROBIC AND ANAEROBIC 5CC  Final   Culture  Setup Time   Final    GRAM POSITIVE COCCI IN CHAINS ANAEROBIC BOTTLE ONLY CRITICAL RESULT CALLED TO, READ BACK BY AND VERIFIED WITH: S DOTY,RN AT 1330 06/15/15 BY L BENFIELD GRAM POSITIVE RODS AEROBIC BOTTLE ONLY CORRECTED RESULTS CALLED TO: C HANCOCK,RN AT 1046 06/17/15 BY L BENFIELD    Culture   Final    VIRIDANS STREPTOCOCCUS THE SIGNIFICANCE OF ISOLATING THIS ORGANISM FROM A SINGLE SET OF BLOOD CULTURES WHEN MULTIPLE SETS ARE DRAWN IS UNCERTAIN. PLEASE NOTIFY THE MICROBIOLOGY DEPARTMENT WITHIN ONE WEEK IF SPECIATION AND SENSITIVITIES ARE REQUIRED. DIPHTHEROIDS(CORYNEBACTERIUM SPECIES) Standardized susceptibility testing for this organism is not available.    Report Status 06/19/2015 FINAL  Final  Blood Culture (routine x 2)     Status: None   Collection Time: 06/13/15  9:04 PM  Result Value Ref Range Status   Specimen Description BLOOD RIGHT WRIST  Final   Special Requests BOTTLES DRAWN AEROBIC AND ANAEROBIC 5CC  Final  Culture NO GROWTH 5 DAYS  Final   Report Status 06/18/2015 FINAL  Final  Urine culture     Status: None   Collection Time: 06/14/15 10:00 AM  Result Value Ref Range Status   Specimen Description URINE, CATHETERIZED  Final   Special Requests NONE  Final   Culture NO GROWTH 1 DAY  Final   Report Status 06/15/2015 FINAL  Final  MRSA PCR Screening     Status: None   Collection Time:  06/14/15  2:15 PM  Result Value Ref Range Status   MRSA by PCR NEGATIVE NEGATIVE Final    Comment:        The GeneXpert MRSA Assay (FDA approved for NASAL specimens only), is one component of a comprehensive MRSA colonization surveillance program. It is not intended to diagnose MRSA infection nor to guide or monitor treatment for MRSA infections.   Culture, blood (routine x 2)     Status: None   Collection Time: 06/17/15 12:58 PM  Result Value Ref Range Status   Specimen Description BLOOD RIGHT ANTECUBITAL  Final   Special Requests IN PEDIATRIC BOTTLE  3CC  Final   Culture NO GROWTH 5 DAYS  Final   Report Status 06/22/2015 FINAL  Final  Culture, blood (routine x 2)     Status: None   Collection Time: 06/17/15  1:00 PM  Result Value Ref Range Status   Specimen Description BLOOD LEFT HAND  Final   Special Requests IN PEDIATRIC BOTTLE 3CC  Final   Culture NO GROWTH 5 DAYS  Final   Report Status 06/22/2015 FINAL  Final  MRSA PCR Screening     Status: None   Collection Time: 06/17/15  6:23 PM  Result Value Ref Range Status   MRSA by PCR NEGATIVE NEGATIVE Final    Comment:        The GeneXpert MRSA Assay (FDA approved for NASAL specimens only), is one component of a comprehensive MRSA colonization surveillance program. It is not intended to diagnose MRSA infection nor to guide or monitor treatment for MRSA infections.     Time spent in discharge (includes decision making & examination of pt): >30 minutes  06/23/2015, 5:10 PM   Cherene Altes, MD [facility] Office  [phone] Pager [phone]  On-Call/Text Page:      [email or web address]      password [facility]

## 2015-06-23 NOTE — Progress Notes (Signed)
SLP Cancellation Note for Speech/Language Evaluation  Patient Details Name: Emily Kennedy MRN: [id number] DOB: 1946-01-20   Cancelled treatment:       Cognitive/linguistic evaluation was not attempted as the Pt was non communicative and unable to follow commands.    Shelly Flatten, MA, [facility] SLP [phone] [name] 06/23/2015, 8:36 AM

## 2015-06-23 NOTE — Progress Notes (Signed)
RT NOTE:  CPAP order entered. Pt has no documented OSA history in chart. No indication for CPAP therapy, note mention of CPAP in any MD notes. Pt does not seem to understand my questions concerning CPAP. Pt also has NG tube that would prevent tight seal of CPAP mask. Will report to AM RT to consult MD for use.

## 2015-06-23 NOTE — Plan of Care (Signed)
Problem: Physical Regulation: Goal: Ability to maintain clinical measurements within normal limits will improve Outcome: Progressing Patient had a hypoglycemic event at 0005 blood sugar was 69.  Patient was diaphoretic and another nurse administered medications per protocol.  MD informed.

## 2015-06-23 NOTE — Care Management Important Message (Signed)
Important Message  Patient Details  Name: Emily Kennedy MRN: [id number] Date of Birth: 09-14-1945   Medicare Important Message Given:  Yes    Nathen May 06/23/2015, 12:06 PM

## 2015-06-23 NOTE — Progress Notes (Signed)
Nutrition Follow Up  DOCUMENTATION CODES:   Not applicable  INTERVENTION:    Continue Glucerna 1.2 formula at goal rate of 60 ml/hr  Above TF regimen providing 1728 kcals, 86 gm protein, 1159 ml of free water   NUTRITION DIAGNOSIS:   Inadequate oral intake related to inability to eat as evidenced by NPO status, ongoing  GOAL:   Patient will meet greater than or equal to 90% of their needs, met  MONITOR:   TF tolerance, Labs, Weight trends, I & O's  ASSESSMENT:   70 y.o. Female with PMHx of MGUS, HTN, chronic diastolic CHF, DM Type 2, leukocytoclastic vasculitis; presesnted with altered mental status and cough, weakness and poor appetite for past 4 days PTA.  Patient remains unresponsive.  No family at bedside. Glucerna 1.2 formula currently infusing at goal rate of 60 ml/hr via CORTRAK feeding tube (tip in stomach).   Jevity 1.2 formula discontinued per MD 4/8. Speech Path continues to follow.  S/p bedside evaluation 4/10. Noted long-term means of nutrition support may need to be considered.  Diet Order:  Diet NPO time specified  Skin:  Reviewed, no issues  CBG (last 3)   Recent Labs  06/23/15 0221 06/23/15 0419 06/23/15 0724  GLUCAP 125* 146* 205*    Last BM:  4/9  Height:   Ht Readings from Last 1 Encounters:  06/14/15 5' 4"  (1.626 m)    Weight:   Wt Readings from Last 1 Encounters:  06/23/15 133 lb 9.6 oz (60.6 kg)    Ideal Body Weight:  54.5 kg  BMI:  Body mass index is 22.92 kg/(m^2).  Estimated Nutritional Needs:   Kcal:  1600-1800  Protein:  80-90 gm  Fluid:  1.6-1.8 L  EDUCATION NEEDS:   No education needs identified at this time  Arthur Holms, RD, LDN Pager #: [phone] After-Hours Pager #: [phone]

## 2015-06-23 NOTE — Evaluation (Signed)
Clinical/Bedside Swallow Evaluation Patient Details  Name: Emily Kennedy MRN: [id number] Date of Birth: August 20, 1945  Today's Date: 06/23/2015 Time: SLP Start Time (ACUTE ONLY): 0810 SLP Stop Time (ACUTE ONLY): 0824 SLP Time Calculation (min) (ACUTE ONLY): 14 min  Past Medical History:  Past Medical History  Diagnosis Date  . Diabetes mellitus   . Vasculitis ([place])   . Hypertension   . Thyroid disease    Past Surgical History:  Past Surgical History  Procedure Laterality Date  . Tubal ligation    . Melanoma excision     HPI:  Emily Kennedy is a 70 y.o. female with a past medical history significant for MGUS, HTN, NIDDM, and leukocytoclastic vasculitis who presents with altered mental status and cough for 4 days.  All history collected from the patient's daughter, present at the bedside because of the patient's mental status. The patient was admitted in January for leg swelling and leg rash and leg weakness, diagnosed with leukocytoclastic vasculitis flare, treated with prednisone, and had improvement in her symptoms. She was readmitted shortly after for hyponatremia, which started to improve and she was discharged home.  Since then the patient's been living at home with her daughter, working with PT, and cognitively being at her baseline without dementia. She continues to have leg weakness that greatly limits her ability to walk, this is a combination of weakness and pain.  Now the last 4 days, the patient has been increasingly weak, requiring assistance to get out of a chair. Several days ago she started coughing, and being sweaty and shaking and complaining of being cold, yesterday had emesis 2-3 times, and today was completely "out of it" and would not respond to family, so they brought her to the ED.  Most recent chest x-ray showing diffuse right sided opacity, questionable PNA vs pulmonary edema.     Assessment / Plan / Recommendation Clinical Impression  ST follow up for PO  readiness completed.  The patient was unable to follow any commands to complete oral mechanism exam.  The patient continued to present with oropharyngeal dysphagia characterized by poor awareness of the bolus, delayed oral transit and delayed swallow trigger.  Due to decreased awareness of the bolus the patient did not consistently move material from her oral cavity and required oral suctioning despite max cues to swallow.  The only material that a pharyngeal swallow was seen was given a tsp sip of thin liquids and the patient had immediate cough response.  Aspiration risk is currently high.  Safest diet at this time continues to be NPO with alternate means for meds/hydration/nutrition.  The patient's current mentation appears to be playing a role in her dysphagia.  Long term non oral nutrition may need to be considered.  ST will continue to follow.      Aspiration Risk  Severe aspiration risk    Diet Recommendation   NPO including medications.  Medication Administration: Via alternative means    Other  Recommendations Oral Care Recommendations: Oral care QID Other Recommendations: Have oral suction available   Follow up Recommendations       Frequency and Duration min 2x/week          Prognosis Prognosis for Safe Diet Advancement: Fair Barriers to Reach Goals: Cognitive deficits      Swallow Study   General Date of Onset: 06/13/15 HPI: Emily Kennedy is a 70 y.o. female with a past medical history significant for MGUS, HTN, NIDDM, and leukocytoclastic vasculitis who presents with altered  mental status and cough for 4 days.  All history collected from the patient's daughter, present at the bedside because of the patient's mental status. The patient was admitted in January for leg swelling and leg rash and leg weakness, diagnosed with leukocytoclastic vasculitis flare, treated with prednisone, and had improvement in her symptoms. She was readmitted shortly after for hyponatremia, which  started to improve and she was discharged home.  Since then the patient's been living at home with her daughter, working with PT, and cognitively being at her baseline without dementia. She continues to have leg weakness that greatly limits her ability to walk, this is a combination of weakness and pain.  Now the last 4 days, the patient has been increasingly weak, requiring assistance to get out of a chair. Several days ago she started coughing, and being sweaty and shaking and complaining of being cold, yesterday had emesis 2-3 times, and today was completely "out of it" and would not respond to family, so they brought her to the ED.  Most recent chest x-ray showing diffuse right sided opacity, questionable PNA vs pulmonary edema.   Type of Study: Bedside Swallow Evaluation Previous Swallow Assessment: See charting - the patient has been seen several times during this admission.   Diet Prior to this Study: NG Tube Temperature Spikes Noted: Yes (2 low grade today.  ) Respiratory Status: Nasal cannula History of Recent Intubation: No Behavior/Cognition: Confused;Lethargic/Drowsy;Doesn't follow directions Oral Cavity Assessment: Dried secretions;Dry Oral Care Completed by SLP: Yes Oral Cavity - Dentition: Edentulous Self-Feeding Abilities: Total assist Patient Positioning: Partially reclined Baseline Vocal Quality: Not observed Volitional Cough: Cognitively unable to elicit Volitional Swallow: Unable to elicit    Oral/Motor/Sensory Function Overall Oral Motor/Sensory Function:  (Unable to asses - pt unable to follow commands.  )   Ice Chips Ice chips: Impaired Presentation: Spoon Oral Phase Impairments: Impaired mastication;Poor awareness of bolus Oral Phase Functional Implications: Prolonged oral transit;Oral holding Pharyngeal Phase Impairments:  (Pharyngeal swallow never triggered)   Thin Liquid Thin Liquid: Impaired Presentation: Spoon Oral Phase Impairments: Poor awareness of  bolus;Impaired mastication Oral Phase Functional Implications: Oral holding;Prolonged oral transit Pharyngeal  Phase Impairments: Suspected delayed Swallow;Cough - Immediate    Nectar Thick Nectar Thick Liquid: Not tested   Honey Thick Honey Thick Liquid: Not tested   Puree Puree: Impaired Presentation: Spoon Oral Phase Impairments: Poor awareness of bolus;Impaired mastication Oral Phase Functional Implications: Oral holding;Prolonged oral transit;Oral residue Pharyngeal Phase Impairments:  (Pt never triggered pharyngeal swallow.  )   Solid   GO   Solid: Not tested       Shelly Flatten, MA, CCC-SLP Acute Rehab SLP [phone] Shelly Flatten N 06/23/2015,8:34 AM

## 2015-06-23 NOTE — Progress Notes (Signed)
Physical Therapy Treatment Patient Details Name: Emily Kennedy MRN: [id number] DOB: 1945-07-04 Today's Date: 06/23/2015    History of Present Illness 70 y.o. female with h/o HTN, NIDDM, MGUS, recent hospitalization in January 2017 for LLE vasculitis admitted with HCAP, sepsis, encephalopathy, weakness.     PT Comments    Pt non-verbal and increased lethargy limiting ability to participate in PT. Pt with no command follow, didn't recognize son, and no initiation of mvmt. Decreased PT freq to 2x/wk due to lethargy.  Follow Up Recommendations  SNF;Supervision/Assistance - 24 hour     Equipment Recommendations  None recommended by PT    Recommendations for Other Services       Precautions / Restrictions Precautions Precautions: Fall Restrictions Weight Bearing Restrictions: No    Mobility  Bed Mobility Overal bed mobility: Needs Assistance Bed Mobility: Supine to Sit;Sit to Supine     Supine to sit: Max assist;+2 for physical assistance Sit to supine: Max assist;+2 for physical assistance   General bed mobility comments: pt initiated R LE x1 but otherwise pt dependent for transfer   Transfers                 General transfer comment: pt no safe this date  Ambulation/Gait             General Gait Details: nt   Financial trader Rankin (Stroke Patients Only)       Balance Overall balance assessment: Needs assistance Sitting-balance support: Feet supported;Bilateral upper extremity supported Sitting balance-Leahy Scale: Zero Sitting balance - Comments: sat EOB x 6 min, pt with posterior lean requiring maxA to prevent falling backwars. atetmpted UE adn LE ther ex sitting EOB however no response Postural control: Posterior lean                          Cognition Arousal/Alertness: Lethargic Behavior During Therapy: Flat affect Overall Cognitive Status: Impaired/Different from baseline Area of  Impairment: Orientation;Attention;Following commands;Safety/judgement;Awareness;Problem solving Orientation Level: Disoriented to;Place;Time;Situation Current Attention Level: Focused   Following Commands: Follows one step commands inconsistently   Awareness: Intellectual Problem Solving: Slow processing;Decreased initiation;Difficulty sequencing;Requires verbal cues;Requires tactile cues General Comments: pt non-verbal and no command following this date, no voluntary mvmt    Exercises Other Exercises Other Exercises: passive ROM to bilat LEs and shoulders in sitting x 5 reps    General Comments General comments (skin integrity, edema, etc.): pt had loose stool, RN and tech notified      Pertinent Vitals/Pain Pain Assessment: Faces Faces Pain Scale: Hurts little more Pain Location: LEs with movement Pain Descriptors / Indicators: Sore Pain Intervention(s): Limited activity within patient's tolerance    Home Living                      Prior Function            PT Goals (current goals can now be found in the care plan section) Progress towards PT goals: Not progressing toward goals - comment (due to lethargy)    Frequency  Min 2X/week    PT Plan Frequency needs to be updated;Current plan remains appropriate    Co-evaluation             End of Session Equipment Utilized During Treatment: Gait belt Activity Tolerance: Patient limited by lethargy Patient left: in bed;with call bell/phone within reach;with bed alarm  set;with family/visitor present     Time: 1313-1330 PT Time Calculation (min) (ACUTE ONLY): 17 min  Charges:  $Therapeutic Activity: 8-22 mins                    G Codes:      Kingsley Callander 06/23/2015, 1:44 PM   Kittie Plater, PT, DPT Pager #: [phone] Office #: [phone]

## 2015-06-23 NOTE — Progress Notes (Addendum)
Called report to [facility]. Spoke with Suan Halter RN Family aware of transfer Pt 's only personal belongings are dentures that are in a cup in belongings bag

## 2015-06-24 ENCOUNTER — Other Ambulatory Visit (HOSPITAL_COMMUNITY): Payer: Self-pay

## 2015-06-24 LAB — COMPREHENSIVE METABOLIC PANEL
ALBUMIN: 1.8 g/dL — AB (ref 3.5–5.0)
ALT: 38 U/L (ref 14–54)
AST: 44 U/L — AB (ref 15–41)
Alkaline Phosphatase: 63 U/L (ref 38–126)
Anion gap: 11 (ref 5–15)
BUN: 33 mg/dL — ABNORMAL HIGH (ref 6–20)
CHLORIDE: 109 mmol/L (ref 101–111)
CO2: 26 mmol/L (ref 22–32)
CREATININE: 1.06 mg/dL — AB (ref 0.44–1.00)
Calcium: 8.8 mg/dL — ABNORMAL LOW (ref 8.9–10.3)
GFR calc non Af Amer: 53 mL/min — ABNORMAL LOW (ref >60)
Glucose, Bld: 234 mg/dL — ABNORMAL HIGH (ref 65–99)
POTASSIUM: 3.7 mmol/L (ref 3.5–5.1)
SODIUM: 146 mmol/L — AB (ref 135–145)
TOTAL PROTEIN: 6.8 g/dL (ref 6.5–8.1)
Total Bilirubin: 0.6 mg/dL (ref 0.3–1.2)

## 2015-06-24 LAB — MAGNESIUM: Magnesium: 1.9 mg/dL (ref 1.7–2.4)

## 2015-06-24 LAB — CBC WITH DIFFERENTIAL/PLATELET
BASOS ABS: 0 10*3/uL (ref 0.0–0.1)
BASOS PCT: 0 %
EOS ABS: 0 10*3/uL (ref 0.0–0.7)
EOS PCT: 0 %
HCT: 27 % — ABNORMAL LOW (ref 36.0–46.0)
Hemoglobin: 9.1 g/dL — ABNORMAL LOW (ref 12.0–15.0)
Lymphocytes Relative: 15 %
Lymphs Abs: 0.9 10*3/uL (ref 0.7–4.0)
MCH: 31.8 pg (ref 26.0–34.0)
MCHC: 33.7 g/dL (ref 30.0–36.0)
MCV: 94.4 fL (ref 78.0–100.0)
Monocytes Absolute: 0.4 10*3/uL (ref 0.1–1.0)
Monocytes Relative: 6 %
Neutro Abs: 4.7 10*3/uL (ref 1.7–7.7)
Neutrophils Relative %: 79 %
PLATELETS: 203 10*3/uL (ref 150–400)
RBC: 2.86 MIL/uL — AB (ref 3.87–5.11)
RDW: 19.7 % — ABNORMAL HIGH (ref 11.5–15.5)
WBC: 6 10*3/uL (ref 4.0–10.5)

## 2015-06-24 LAB — RPR: RPR Ser Ql: REACTIVE — AB

## 2015-06-24 LAB — RPR, QUANT+TP ABS (REFLEX): T Pallidum Abs: POSITIVE — AB

## 2015-06-24 LAB — PHOSPHORUS: PHOSPHORUS: 2 mg/dL — AB (ref 2.5–4.6)

## 2015-06-24 LAB — BRAIN NATRIURETIC PEPTIDE: B Natriuretic Peptide: 2557.8 pg/mL — ABNORMAL HIGH (ref 0.0–100.0)

## 2015-06-24 LAB — T4, FREE: FREE T4: 1.24 ng/dL — AB (ref 0.61–1.12)

## 2015-06-24 LAB — PROCALCITONIN: Procalcitonin: 0.11 ng/mL

## 2015-06-24 LAB — TSH: TSH: 3.483 u[IU]/mL (ref 0.350–4.500)

## 2015-06-25 LAB — BASIC METABOLIC PANEL
ANION GAP: 10 (ref 5–15)
BUN: 43 mg/dL — ABNORMAL HIGH (ref 6–20)
CALCIUM: 9.1 mg/dL (ref 8.9–10.3)
CHLORIDE: 112 mmol/L — AB (ref 101–111)
CO2: 27 mmol/L (ref 22–32)
Creatinine, Ser: 0.85 mg/dL (ref 0.44–1.00)
GFR calc non Af Amer: >60 mL/min (ref >60)
GLUCOSE: 195 mg/dL — AB (ref 65–99)
POTASSIUM: 4.5 mmol/L (ref 3.5–5.1)
Sodium: 149 mmol/L — ABNORMAL HIGH (ref 135–145)

## 2015-06-25 LAB — HEMOGLOBIN A1C: Hgb A1c MFr Bld: <4.3 % — ABNORMAL LOW (ref 4.8–5.6)

## 2015-06-25 LAB — CBC WITH DIFFERENTIAL/PLATELET
BASOS ABS: 0 10*3/uL (ref 0.0–0.1)
Basophils Relative: 0 %
EOS PCT: 0 %
Eosinophils Absolute: 0 10*3/uL (ref 0.0–0.7)
HEMATOCRIT: 27.2 % — AB (ref 36.0–46.0)
HEMOGLOBIN: 9.2 g/dL — AB (ref 12.0–15.0)
LYMPHS PCT: 11 %
Lymphs Abs: 0.8 10*3/uL (ref 0.7–4.0)
MCH: 33.6 pg (ref 26.0–34.0)
MCHC: 33.8 g/dL (ref 30.0–36.0)
MCV: 99.3 fL (ref 78.0–100.0)
MONOS PCT: 5 %
Monocytes Absolute: 0.4 10*3/uL (ref 0.1–1.0)
Neutro Abs: 6.4 10*3/uL (ref 1.7–7.7)
Neutrophils Relative %: 84 %
Platelets: 234 10*3/uL (ref 150–400)
RBC: 2.74 MIL/uL — AB (ref 3.87–5.11)
RDW: 21.2 % — ABNORMAL HIGH (ref 11.5–15.5)
WBC: 7.6 10*3/uL (ref 4.0–10.5)

## 2015-06-25 LAB — MAGNESIUM: Magnesium: 2.1 mg/dL (ref 1.7–2.4)

## 2015-06-25 LAB — PHOSPHORUS: PHOSPHORUS: 2 mg/dL — AB (ref 2.5–4.6)

## 2015-06-26 ENCOUNTER — Other Ambulatory Visit (HOSPITAL_COMMUNITY): Payer: Self-pay

## 2015-06-26 LAB — LACTIC ACID, PLASMA
LACTIC ACID, VENOUS: 2 mmol/L (ref 0.5–2.0)
Lactic Acid, Venous: 2 mmol/L (ref 0.5–2.0)

## 2015-06-26 LAB — BLOOD GAS, ARTERIAL
ACID-BASE DEFICIT: 0.2 mmol/L (ref 0.0–2.0)
BICARBONATE: 24.9 meq/L — AB (ref 20.0–24.0)
FIO2: 1
O2 SAT: 97.2 %
PEEP/CPAP: 5 cmH2O
PH ART: 7.342 — AB (ref 7.350–7.450)
Patient temperature: 98.6
RATE: 12 resp/min
TCO2: 26.3 mmol/L (ref 0–100)
VT: 550 mL
pCO2 arterial: 47.1 mmHg — ABNORMAL HIGH (ref 35.0–45.0)
pO2, Arterial: 110 mmHg — ABNORMAL HIGH (ref 80.0–100.0)

## 2015-06-26 LAB — URINALYSIS, ROUTINE W REFLEX MICROSCOPIC
BILIRUBIN URINE: NEGATIVE
Glucose, UA: 100 mg/dL — AB
Ketones, ur: NEGATIVE mg/dL
NITRITE: NEGATIVE
PROTEIN: 100 mg/dL — AB
SPECIFIC GRAVITY, URINE: 1.021 (ref 1.005–1.030)
pH: 5.5 (ref 5.0–8.0)

## 2015-06-26 LAB — BASIC METABOLIC PANEL
Anion gap: 14 (ref 5–15)
BUN: 47 mg/dL — AB (ref 6–20)
CHLORIDE: 111 mmol/L (ref 101–111)
CO2: 25 mmol/L (ref 22–32)
CREATININE: 0.92 mg/dL (ref 0.44–1.00)
Calcium: 9.4 mg/dL (ref 8.9–10.3)
GFR calc non Af Amer: >60 mL/min (ref >60)
Glucose, Bld: 262 mg/dL — ABNORMAL HIGH (ref 65–99)
POTASSIUM: 5.4 mmol/L — AB (ref 3.5–5.1)
SODIUM: 150 mmol/L — AB (ref 135–145)

## 2015-06-26 LAB — COMPREHENSIVE METABOLIC PANEL
ALBUMIN: 2 g/dL — AB (ref 3.5–5.0)
ALK PHOS: 87 U/L (ref 38–126)
ALT: 130 U/L — AB (ref 14–54)
AST: 179 U/L — ABNORMAL HIGH (ref 15–41)
Anion gap: 12 (ref 5–15)
BUN: 48 mg/dL — AB (ref 6–20)
CALCIUM: 9.2 mg/dL (ref 8.9–10.3)
CO2: 24 mmol/L (ref 22–32)
CREATININE: 1.22 mg/dL — AB (ref 0.44–1.00)
Chloride: 115 mmol/L — ABNORMAL HIGH (ref 101–111)
GFR calc Af Amer: 52 mL/min — ABNORMAL LOW (ref >60)
GFR calc non Af Amer: 44 mL/min — ABNORMAL LOW (ref >60)
GLUCOSE: 269 mg/dL — AB (ref 65–99)
Potassium: 5.8 mmol/L — ABNORMAL HIGH (ref 3.5–5.1)
SODIUM: 151 mmol/L — AB (ref 135–145)
Total Bilirubin: 0.6 mg/dL (ref 0.3–1.2)
Total Protein: 7.6 g/dL (ref 6.5–8.1)

## 2015-06-26 LAB — CBC WITH DIFFERENTIAL/PLATELET
BASOS ABS: 0.2 10*3/uL — AB (ref 0.0–0.1)
Basophils Relative: 1 %
EOS PCT: 2 %
Eosinophils Absolute: 0.5 10*3/uL (ref 0.0–0.7)
HCT: 32.7 % — ABNORMAL LOW (ref 36.0–46.0)
HEMOGLOBIN: 10 g/dL — AB (ref 12.0–15.0)
LYMPHS PCT: 13 %
Lymphs Abs: 3.1 10*3/uL (ref 0.7–4.0)
MCH: 30 pg (ref 26.0–34.0)
MCHC: 30.6 g/dL (ref 30.0–36.0)
MCV: 98.2 fL (ref 78.0–100.0)
MONOS PCT: 5 %
Monocytes Absolute: 1.2 10*3/uL — ABNORMAL HIGH (ref 0.1–1.0)
NEUTROS ABS: 18.5 10*3/uL — AB (ref 1.7–7.7)
Neutrophils Relative %: 79 %
Platelets: ADEQUATE 10*3/uL (ref 150–400)
RBC: 3.33 MIL/uL — AB (ref 3.87–5.11)
RDW: 19.1 % — ABNORMAL HIGH (ref 11.5–15.5)
WBC: 23.5 10*3/uL — ABNORMAL HIGH (ref 4.0–10.5)

## 2015-06-26 LAB — CBC
HEMATOCRIT: 32.9 % — AB (ref 36.0–46.0)
Hemoglobin: 10.6 g/dL — ABNORMAL LOW (ref 12.0–15.0)
MCH: 32.3 pg (ref 26.0–34.0)
MCHC: 32.2 g/dL (ref 30.0–36.0)
MCV: 100.3 fL — AB (ref 78.0–100.0)
PLATELETS: 444 10*3/uL — AB (ref 150–400)
RBC: 3.28 MIL/uL — ABNORMAL LOW (ref 3.87–5.11)
RDW: 20.3 % — AB (ref 11.5–15.5)
WBC: 16.7 10*3/uL — AB (ref 4.0–10.5)

## 2015-06-26 LAB — URINE MICROSCOPIC-ADD ON

## 2015-06-26 LAB — AMMONIA: Ammonia: 36 umol/L — ABNORMAL HIGH (ref 9–35)

## 2015-06-26 LAB — PROTIME-INR
INR: 1.22 (ref 0.00–1.49)
PROTHROMBIN TIME: 15.6 s — AB (ref 11.6–15.2)

## 2015-06-26 LAB — TROPONIN I
Troponin I: 0.08 ng/mL — ABNORMAL HIGH (ref <0.031)
Troponin I: 0.22 ng/mL — ABNORMAL HIGH (ref <0.031)

## 2015-06-26 LAB — MAGNESIUM
Magnesium: 2.3 mg/dL (ref 1.7–2.4)
Magnesium: 2.3 mg/dL (ref 1.7–2.4)

## 2015-06-26 LAB — PHOSPHORUS
Phosphorus: 4 mg/dL (ref 2.5–4.6)
Phosphorus: 4.7 mg/dL — ABNORMAL HIGH (ref 2.5–4.6)

## 2015-06-26 LAB — LACTATE DEHYDROGENASE: LDH: 739 U/L — ABNORMAL HIGH (ref 98–192)

## 2015-06-27 ENCOUNTER — Other Ambulatory Visit (HOSPITAL_COMMUNITY): Payer: Self-pay

## 2015-06-27 DIAGNOSIS — R092 Respiratory arrest: Secondary | ICD-10-CM

## 2015-06-27 DIAGNOSIS — J9601 Acute respiratory failure with hypoxia: Secondary | ICD-10-CM

## 2015-06-27 DIAGNOSIS — I469 Cardiac arrest, cause unspecified: Secondary | ICD-10-CM

## 2015-06-27 LAB — CBC
HEMATOCRIT: 21.9 % — AB (ref 36.0–46.0)
HEMOGLOBIN: 7.1 g/dL — AB (ref 12.0–15.0)
MCH: 32.4 pg (ref 26.0–34.0)
MCHC: 32.4 g/dL (ref 30.0–36.0)
MCV: 100 fL (ref 78.0–100.0)
PLATELETS: 164 10*3/uL (ref 150–400)
RBC: 2.19 MIL/uL — AB (ref 3.87–5.11)
RDW: 20.5 % — ABNORMAL HIGH (ref 11.5–15.5)
WBC: 7.7 10*3/uL (ref 4.0–10.5)

## 2015-06-27 LAB — BASIC METABOLIC PANEL
ANION GAP: 12 (ref 5–15)
BUN: 56 mg/dL — ABNORMAL HIGH (ref 6–20)
CHLORIDE: 114 mmol/L — AB (ref 101–111)
CO2: 28 mmol/L (ref 22–32)
CREATININE: 1.36 mg/dL — AB (ref 0.44–1.00)
Calcium: 8.5 mg/dL — ABNORMAL LOW (ref 8.9–10.3)
GFR calc non Af Amer: 39 mL/min — ABNORMAL LOW (ref >60)
GFR, EST AFRICAN AMERICAN: 45 mL/min — AB (ref >60)
Glucose, Bld: 192 mg/dL — ABNORMAL HIGH (ref 65–99)
POTASSIUM: 3.4 mmol/L — AB (ref 3.5–5.1)
Sodium: 154 mmol/L — ABNORMAL HIGH (ref 135–145)

## 2015-06-27 LAB — TROPONIN I
Troponin I: 0.14 ng/mL — ABNORMAL HIGH (ref <0.031)
Troponin I: 0.11 ng/mL — ABNORMAL HIGH (ref <0.031)
Troponin I: 0.09 ng/mL — ABNORMAL HIGH (ref <0.031)

## 2015-06-27 LAB — PATHOLOGIST SMEAR REVIEW

## 2015-06-27 NOTE — Consult Note (Signed)
Name: Emily Kennedy MRN: [id number] DOB: [birth year]    ADMISSION DATE:  06/23/2015 CONSULTATION DATE:  4/14  REFERRING MD :  [facility]  CHIEF COMPLAINT:  Resp fail  SIGNIFICANT EVENTS  4/13 cardiac arrest  STUDIES:    HISTORY OF PRESENT ILLNESS:  [name] is 70 yo WF, recently discharged from [facility] hospital 4/10 by Dr. There[facility] to [facility] with Discharge Dx of,. Sepsis , resp failure from Streptococcus of rt lung. Legionella pna, PAF, HF,Dysphagia, hyponatremia, hypokalemia, DM 2,anemia and hypothyroidism. She required 2-4 l [place] till 4/13 when she had cardiac event from presumed resp trigger from mucus plugging. Shock and epi given along with urgent intubation with ROSC. PCCM was consulted on 4/14 for ventilator management help. Further she has AMS and is not following commands while on ventilator. Her Afib is controlled and currently in SR.   PAST MEDICAL HISTORY :   has a past medical history of Diabetes mellitus; Vasculitis ([place]); Hypertension; and Thyroid disease.  has past surgical history that includes Tubal ligation and Melanoma excision. Prior to Admission medications   Medication Sig Start Date End Date Taking? Authorizing Provider  acetaminophen (TYLENOL) 160 MG/5ML solution Place 20.3 mLs (650 mg total) into feeding tube every 6 (six) hours as needed for mild pain, headache or fever. 06/23/15   Cherene Altes, MD  acetaminophen (TYLENOL) 650 MG suppository Place 1 suppository (650 mg total) rectally every 4 (four) hours as needed for mild pain or fever. 06/23/15   Cherene Altes, MD  antiseptic oral rinse (CPC / CETYLPYRIDINIUM CHLORIDE 0.05%) 0.05 % LIQD solution 7 mLs by Mouth Rinse route 2 times daily at 12 noon and 4 pm. 06/23/15   Cherene Altes, MD  carvedilol (COREG) 25 MG tablet Place 1 tablet (25 mg total) into feeding tube 2 (two) times daily. 06/23/15   Cherene Altes, MD  chlorhexidine (PERIDEX) 0.12 % solution 15 mLs by Mouth Rinse route  2 (two) times daily. 06/23/15   Cherene Altes, MD  diltiazem (CARDIZEM) 10 mg/ml oral suspension Place 6 mLs (60 mg total) into feeding tube every 8 (eight) hours. 06/23/15   Cherene Altes, MD  enoxaparin (LOVENOX) 40 MG/0.4ML injection Inject 0.4 mLs (40 mg total) into the skin daily. 06/23/15   Cherene Altes, MD  furosemide (LASIX) 40 MG tablet Place 1 tablet (40 mg total) into feeding tube 2 (two) times daily. 06/23/15   Cherene Altes, MD  insulin aspart (NOVOLOG) 100 UNIT/ML injection Inject 0-9 Units into the skin every 4 (four) hours. 06/23/15   Cherene Altes, MD  insulin glargine (LANTUS) 100 UNIT/ML injection Inject 0.12 mLs (12 Units total) into the skin daily. 06/23/15   Cherene Altes, MD  levothyroxine (SYNTHROID, LEVOTHROID) 100 MCG SOLR injection Inject 3.75 mLs (75 mcg total) into the vein daily. 06/23/15   Cherene Altes, MD  Nutritional Supplements (FEEDING SUPPLEMENT, GLUCERNA 1.2 CAL,) LIQD Place 1,000 mLs into feeding tube continuous. 06/23/15   Cherene Altes, MD  ondansetron [facility]) 4 MG/2ML SOLN injection Inject 2 mLs (4 mg total) into the vein every 6 (six) hours as needed for nausea. 06/23/15   Cherene Altes, MD  potassium chloride 20 MEQ/15ML (10%) SOLN Place 30 mLs (40 mEq total) into feeding tube 2 (two) times daily. 06/23/15   Cherene Altes, MD  predniSONE (DELTASONE) 20 MG tablet Place 2 tablets (40 mg total) into feeding tube daily with breakfast. 06/24/15  Cherene Altes, MD  sodium chloride flush (NS) 0.9 % SOLN Inject 3 mLs into the vein every 12 (twelve) hours. 06/23/15   Cherene Altes, MD   Allergies  Allergen Reactions  . Benadryl [Diphenhydramine] Swelling  . Ciprofloxacin Hives  . Furosemide Itching, Swelling, Rash and Other (See Comments)    Made edema worse  . Nitrofurantoin Monohyd Macro Hives  . Septra [Bactrim] Hives  . Sulfa Drugs Cross Reactors Hives  . Ceclor [Cefaclor] Rash    Breaks out in rash chest down.     FAMILY HISTORY:  family history includes Cancer in her sister. SOCIAL HISTORY:  reports that she has never smoked. She has never used smokeless tobacco. She reports that she does not drink alcohol or use illicit drugs.  REVIEW OF SYSTEMS:   na  SUBJECTIVE:   VITAL SIGNS: 98.6 87 SR RR 12 148/72 99%   PHYSICAL EXAMINATION: General: Fail, wasted , WF Neuro: NO follows commands, restrained in bed. MAEx 4 HEENT:  OTT-> vent, NGT with TF Cardiovascular:  HSR RRR currently SR Lungs: coarse rhonchi bilaterally Abdomen:  + bs Musculoskeletal:  intact Skin: warm and dry    Recent Labs Lab 06/26/15 0651 06/26/15 0936 06/27/15 0737  NA 150* 151* 154*  K 5.4* 5.8* 3.4*  CL 111 115* 114*  CO2 25 24 28   BUN 47* 48* 56*  CREATININE 0.92 1.22* 1.36*  GLUCOSE 262* 269* 192*    Recent Labs Lab 06/26/15 0651 06/26/15 0936 06/27/15 0737  HGB 10.6* 10.0* 7.1*  HCT 32.9* 32.7* 21.9*  WBC 16.7* 23.5* 7.7  PLT 444* PLATELET CLUMPS NOTED ON SMEAR, COUNT APPEARS ADEQUATE 164   US Abdomen Complete  06/27/2015  CLINICAL DATA:  Transaminitis EXAM: ABDOMEN ULTRASOUND COMPLETE COMPARISON:  03/20/2014 FINDINGS: Gallbladder: Layering sludge within the gallbladder. Small amount of pericholecystic fluid. No wall thickening. Negative sonographic Murphy's. Common bile duct: Diameter: Normal caliber, 3 mm Liver: No focal lesion identified. Within normal limits in parenchymal echogenicity. IVC: No abnormality visualized. Pancreas: Visualized portion unremarkable. Spleen: Borderline spleen size at 12 cm in craniocaudal length, stable since prior study. Right Kidney: Length: 11.3 cm. Echogenicity within normal limits. No mass or hydronephrosis visualized. Left Kidney: Length: 10.9 cm. Echogenicity within normal limits. No mass or hydronephrosis visualized. Abdominal aorta: No aneurysm visualized. Other findings: Incidentally noted is a right pleural effusion. IMPRESSION: Layering sludge within the  gallbladder. Small amount of pericholecystic fluid. No stones, wall thickening or sonographic Murphy's sign. Stable borderline spleen size. Right pleural effusion. Electronically Signed   By: Rolm Baptise M.D.   On: 06/27/2015 08:49   Dg Chest Port 1 View  06/27/2015  CLINICAL DATA:  Respiratory failure. EXAM: PORTABLE CHEST 1 VIEW COMPARISON:  June 26, 2015. FINDINGS: Stable cardiomediastinal silhouette. Endotracheal and feeding tubes are unchanged in position. Right-sided PICC line is unchanged in position. Stable bilateral basilar opacities are noted concerning for edema or atelectasis with associated pleural effusions. Continued presence of mildly displaced left rib fracture. No pneumothorax is noted. IMPRESSION: Stable support apparatus. Stable bibasilar opacities as described above. Electronically Signed   By: Marijo Conception, M.D.   On: 06/27/2015 09:17   Dg Chest Port 1 View  06/26/2015  CLINICAL DATA:  Right PICC placement.  Initial encounter. EXAM: PORTABLE CHEST 1 VIEW COMPARISON:  Chest radiograph performed earlier today at 10:15 a.m. FINDINGS: The patient's endotracheal tube is seen ending 4 cm above the carina. A right PICC is noted ending about the mid to distal  SVC. An enteric tube is noted extending below the diaphragm. Small bilateral pleural effusions are seen. Bibasilar airspace opacities raise concern for pulmonary edema. Previously noted right apical opacity has improved, though superimposed pneumonia cannot be excluded. No pneumothorax is seen. The cardiomediastinal silhouette is borderline enlarged. A mildly displaced left lateral sixth rib fracture is again noted. IMPRESSION: 1. Endotracheal tube seen ending 4 cm above the carina. 2. Right PICC noted ending about the mid to distal SVC. 3. Small bilateral pleural effusions. Bibasilar airspace opacities raise concern for pulmonary edema. Previously noted right apical opacity has improved, though superimposed pneumonia cannot be  excluded. 4. Borderline cardiomegaly. 5. Mildly displaced left lateral sixth rib fracture again noted. Electronically Signed   By: Garald Balding M.D.   On: 06/26/2015 21:19   Dg Chest Port 1 View  06/26/2015  CLINICAL DATA:  Endotracheal tube placement. EXAM: PORTABLE CHEST 1 VIEW COMPARISON:  Same day. FINDINGS: Stable cardiomediastinal silhouette. Feeding tube is seen entering stomach. Interval placement of endotracheal tube, which is projected over tracheal air shadow with distal tip 5 cm above the carina. No pneumothorax is noted. Stable bilateral lung opacities are noted concerning for edema. Bony thorax is unremarkable. IMPRESSION: Endotracheal tube in grossly good position. Stable bilateral lung opacities as described above. Electronically Signed   By: Marijo Conception, M.D.   On: 06/26/2015 10:35   Dg Chest Port 1 View  06/26/2015  CLINICAL DATA:  Pneumonia.  Shortness of breath. EXAM: PORTABLE CHEST 1 VIEW COMPARISON:  06/24/2015. FINDINGS: Feeding tube in stable position. Cardiomegaly with pulmonary vascular prominence and diffuse bilateral pulmonary infiltrates and pleural effusions consistent with congestive heart failure. Interim worsening from prior exam. IMPRESSION: 1. Feeding tube in stable position. 2. Worsening of congestive heart failure and pulmonary edema. Bilateral pleural effusions again noted . Electronically Signed   By: Marcello Moores  Register   On: 06/26/2015 08:06    ASSESSMENT: Active Problems:   Cardiac arrest ([place]) 4/13 suspected mucus plugging as cause of arrest   Respiratory arrest ([place]) intubated 4/13 post arrest   Diabetes mellitus type 2, uncontrolled (HCC)   Anemia, chronic disease   Hypothyroidism   Essential hypertension   Acute encephalopathy   Atrial fibrillation with RVR (HCC)   Acute on chronic diastolic CHF (congestive heart failure), NYHA class 3 ([place])     Discussion: MS. Monast is 70 yo WF, recently discharged from [facility] hospital 4/10 by Dr. There[facility] Solo [facility] to [facility] with Discharge Dx of,. Sepsis , resp failure from Streptococcus of rt lung. Legionella pna, PAF, HF,Dysphagia, hyponatremia, hypokalemia, DM 2,anemia and hypothyroidism. She required 2-4 l [place] till 4/13 when she had cardiac event from presumed resp trigger from mucus plugging. Shock and epi given along with urgent intubation with ROSC. PCCM was consulted on 4/14 for ventilator management help. Further she has AMS and is not following commands while on ventilator. Her Afib is controlled and currently in SR.   PLAN: Wean per protocol B's as needed Not need for FOB currently On steroids  All other issues per [place] P[facility] Pager [phone] till 3 pm If no answer page [phone] 06/27/2015, 1:43 PM  Attending Note:  I have examined patient, reviewed labs, studies and notes. I have discussed the case with S Minor, and I agree with the data and plans as amended above. Pt was admitted to [facility] for further care after being treated for legionella and pneumococcal PNA. She unfortunately had resp  and then cardiac arrest presumed due to mucous plugging. She was resuscitated, intubated. Currently stable on 0.40 fio2. I believe we can proceed with transition to SBT's per [facility] protocol. Continue current abx. Push pulm hygiene and secretion clearance. Independent critical care time is 40 minutes.   Baltazar Apo, MD, PhD 06/27/2015, 5:01 PM [facility] [phone] or if no answer [phone]

## 2015-06-28 ENCOUNTER — Other Ambulatory Visit (HOSPITAL_COMMUNITY): Payer: Self-pay

## 2015-06-28 LAB — BRAIN NATRIURETIC PEPTIDE: B Natriuretic Peptide: 1048.5 pg/mL — ABNORMAL HIGH (ref 0.0–100.0)

## 2015-06-28 LAB — COMPREHENSIVE METABOLIC PANEL
ALT: 50 U/L (ref 14–54)
AST: 30 U/L (ref 15–41)
Albumin: 1.5 g/dL — ABNORMAL LOW (ref 3.5–5.0)
Alkaline Phosphatase: 56 U/L (ref 38–126)
Anion gap: 11 (ref 5–15)
BUN: 50 mg/dL — AB (ref 6–20)
CHLORIDE: 114 mmol/L — AB (ref 101–111)
CO2: 29 mmol/L (ref 22–32)
CREATININE: 1.24 mg/dL — AB (ref 0.44–1.00)
Calcium: 8.5 mg/dL — ABNORMAL LOW (ref 8.9–10.3)
GFR calc non Af Amer: 44 mL/min — ABNORMAL LOW (ref >60)
GFR, EST AFRICAN AMERICAN: 51 mL/min — AB (ref >60)
Glucose, Bld: 164 mg/dL — ABNORMAL HIGH (ref 65–99)
POTASSIUM: 3.6 mmol/L (ref 3.5–5.1)
SODIUM: 154 mmol/L — AB (ref 135–145)
Total Bilirubin: 0.7 mg/dL (ref 0.3–1.2)
Total Protein: 6 g/dL — ABNORMAL LOW (ref 6.5–8.1)

## 2015-06-28 LAB — URINE CULTURE: CULTURE: NO GROWTH

## 2015-06-28 LAB — CBC
HCT: 21.9 % — ABNORMAL LOW (ref 36.0–46.0)
Hemoglobin: 6.7 g/dL — CL (ref 12.0–15.0)
MCH: 30.9 pg (ref 26.0–34.0)
MCHC: 30.6 g/dL (ref 30.0–36.0)
MCV: 100.9 fL — ABNORMAL HIGH (ref 78.0–100.0)
PLATELETS: 181 10*3/uL (ref 150–400)
RBC: 2.17 MIL/uL — AB (ref 3.87–5.11)
RDW: 19.7 % — ABNORMAL HIGH (ref 11.5–15.5)
WBC: 9.1 10*3/uL (ref 4.0–10.5)

## 2015-06-28 LAB — VANCOMYCIN, RANDOM: Vancomycin Rm: 23 ug/mL

## 2015-06-28 LAB — PREPARE RBC (CROSSMATCH)

## 2015-06-28 LAB — ABO/RH: ABO/RH(D): B POS

## 2015-06-28 LAB — TROPONIN I: TROPONIN I: 0.11 ng/mL — AB (ref <0.031)

## 2015-06-28 LAB — OCCULT BLOOD X 1 CARD TO LAB, STOOL: Fecal Occult Bld: NEGATIVE

## 2015-06-28 LAB — HEMOGLOBIN A1C
HEMOGLOBIN A1C: 4.8 % (ref 4.8–5.6)
Mean Plasma Glucose: 91 mg/dL

## 2015-06-29 LAB — TYPE AND SCREEN
ABO/RH(D): B POS
ANTIBODY SCREEN: NEGATIVE
Unit division: 0
Unit division: 0

## 2015-06-29 LAB — TROPONIN I
TROPONIN I: 0.06 ng/mL — AB (ref <0.031)
Troponin I: 0.06 ng/mL — ABNORMAL HIGH (ref <0.031)

## 2015-06-29 LAB — CULTURE, BLOOD (ROUTINE X 2)
CULTURE: NO GROWTH
CULTURE: NO GROWTH

## 2015-06-29 LAB — CBC
HEMATOCRIT: 23.2 % — AB (ref 36.0–46.0)
Hemoglobin: 8.4 g/dL — ABNORMAL LOW (ref 12.0–15.0)
MCH: 36.5 pg — ABNORMAL HIGH (ref 26.0–34.0)
MCHC: 36.2 g/dL — ABNORMAL HIGH (ref 30.0–36.0)
MCV: 100.9 fL — ABNORMAL HIGH (ref 78.0–100.0)
PLATELETS: 174 10*3/uL (ref 150–400)
RBC: 2.3 MIL/uL — ABNORMAL LOW (ref 3.87–5.11)
RDW: 20.9 % — AB (ref 11.5–15.5)
WBC: 9.9 10*3/uL (ref 4.0–10.5)

## 2015-06-29 LAB — BASIC METABOLIC PANEL
ANION GAP: 9 (ref 5–15)
BUN: 47 mg/dL — AB (ref 6–20)
CALCIUM: 8.6 mg/dL — AB (ref 8.9–10.3)
CO2: 29 mmol/L (ref 22–32)
CREATININE: 1.1 mg/dL — AB (ref 0.44–1.00)
Chloride: 111 mmol/L (ref 101–111)
GFR calc non Af Amer: 50 mL/min — ABNORMAL LOW (ref >60)
GFR, EST AFRICAN AMERICAN: 58 mL/min — AB (ref >60)
GLUCOSE: 223 mg/dL — AB (ref 65–99)
Potassium: 3.5 mmol/L (ref 3.5–5.1)
SODIUM: 149 mmol/L — AB (ref 135–145)

## 2015-06-29 LAB — OCCULT BLOOD X 1 CARD TO LAB, STOOL: Fecal Occult Bld: NEGATIVE

## 2015-06-29 LAB — MAGNESIUM: MAGNESIUM: 2.1 mg/dL (ref 1.7–2.4)

## 2015-06-30 ENCOUNTER — Other Ambulatory Visit (HOSPITAL_COMMUNITY): Payer: Self-pay

## 2015-06-30 LAB — HEMOGLOBIN AND HEMATOCRIT, BLOOD
HCT: 25.5 % — ABNORMAL LOW (ref 36.0–46.0)
Hemoglobin: 8.6 g/dL — ABNORMAL LOW (ref 12.0–15.0)

## 2015-06-30 LAB — BASIC METABOLIC PANEL
Anion gap: 8 (ref 5–15)
BUN: 40 mg/dL — AB (ref 6–20)
CHLORIDE: 112 mmol/L — AB (ref 101–111)
CO2: 27 mmol/L (ref 22–32)
CREATININE: 0.99 mg/dL (ref 0.44–1.00)
Calcium: 8.6 mg/dL — ABNORMAL LOW (ref 8.9–10.3)
GFR calc Af Amer: >60 mL/min (ref >60)
GFR calc non Af Amer: 57 mL/min — ABNORMAL LOW (ref >60)
GLUCOSE: 179 mg/dL — AB (ref 65–99)
Potassium: 3.9 mmol/L (ref 3.5–5.1)
SODIUM: 147 mmol/L — AB (ref 135–145)

## 2015-06-30 MED FILL — Medication: Qty: 1 | Status: AC

## 2015-06-30 NOTE — Consult Note (Addendum)
Name: Emily Kennedy MRN: [id number] DOB: October 07, 1945    ADMISSION DATE:  06/23/2015 CONSULTATION DATE:  4/14  REFERRING MD :  [facility]  CHIEF COMPLAINT:  Resp fail  SIGNIFICANT EVENTS  4/13 cardiac arrest  STUDIES:   BRIEF MS. Tavera is 70 yo WF, recently discharged from [facility] hospital 4/10 by Dr. Thereasa Solo [facility] to [facility] with Discharge Dx of,. Sepsis , resp failure from Streptococcus of rt lung. Legionella pna, PAF, HF,Dysphagia, hyponatremia, hypokalemia, DM 2,anemia and hypothyroidism. She required 2-4 l [place] till 4/13 when she had cardiac event from presumed resp trigger from mucus plugging. Shock and epi given along with urgent intubation with ROSC. PCCM was consulted on 4/14 for ventilator management help. Further she has AMS and is not following commands while on ventilator. Her Afib is controlled and currently in SR.  EVENTS 4/14 - Pt was admitted to [facility] for further care after being treated for legionella and pneumococcal PNA. She unfortunately had resp and then cardiac arrest presumed due to mucous plugging. She was resuscitated, intubated. Currently stable on 0.40 fio2. I believe we can proceed with transition to SBT's per [facility] protocol. Continue current abx. Push pulm hygiene and secretion clearance    SUBJECTIVE/OVERNIGHT/INTERVAL HX'.  4/17 - doing SBT. Not on sedation gtt. Daughter at bedside - denies questions. Per RN - patient calm, answers all questions, move 4s but looks deconditioned (still better looking than few days ago). Improving AKI  VITAL SIGNS: T 97.5, SBP 133/91, Pulse ox 99%RR 17   MEDS Coreg  + cardizem + fish pil + lisinopril Zosyn  + vanc Prednisone Insulin  PHYSICAL EXAMINATION: General: Fail, wasted , WF Neuro: NO follows commands, restrained in bed. MAEx 4 HEENT:  OTT-> vent, NGT with TF Cardiovascular:  HSR RRR currently SR Lungs: coarse rhonchi bilaterally Abdomen:  + bs Musculoskeletal:  intact Skin: warm and  dry   PULMONARY  Recent Labs Lab 06/26/15 0905  PHART 7.342*  PCO2ART 47.1*  PO2ART 110*  HCO3 24.9*  TCO2 26.3  O2SAT 97.2    CBC  Recent Labs Lab 06/27/15 0737 06/28/15 0719 06/29/15 0716 06/30/15 0838  HGB 7.1* 6.7* 8.4* 8.6*  HCT 21.9* 21.9* 23.2* 25.5*  WBC 7.7 9.1 9.9  --   PLT 164 181 174  --     COAGULATION  Recent Labs Lab 06/26/15 1518  INR 1.22    CARDIAC   Recent Labs Lab 06/27/15 1655 06/27/15 2221 06/28/15 0719 06/29/15 0716 06/29/15 1445  TROPONINI 0.11* 0.09* 0.11* 0.06* 0.06*   No results for input(s): PROBNP in the last 168 hours.   CHEMISTRY  Recent Labs Lab 06/24/15 0925 06/25/15 0829 06/26/15 0651 06/26/15 0936 06/27/15 0737 06/28/15 0719 06/29/15 0716 06/29/15 1445 06/30/15 0838  NA 146* 149* 150* 151* 154* 154* 149*  --  147*  K 3.7 4.5 5.4* 5.8* 3.4* 3.6 3.5  --  3.9  CL 109 112* 111 115* 114* 114* 111  --  112*  CO2 26 27 25 24 28 29 29   --  27  GLUCOSE 234* 195* 262* 269* 192* 164* 223*  --  179*  BUN 33* 43* 47* 48* 56* 50* 47*  --  40*  CREATININE 1.06* 0.85 0.92 1.22* 1.36* 1.24* 1.10*  --  0.99  CALCIUM 8.8* 9.1 9.4 9.2 8.5* 8.5* 8.6*  --  8.6*  MG 1.9 2.1 2.3 2.3  --   --   --  2.1  --   PHOS 2.0* 2.0*  4.0 4.7*  --   --   --   --   --    Estimated Creatinine Clearance: 47 mL/min (by C-G formula based on Cr of 0.99).   LIVER  Recent Labs Lab 06/24/15 0925 06/26/15 0936 06/26/15 1518 06/28/15 0719  AST 44* 179*  --  30  ALT 38 130*  --  50  ALKPHOS 63 87  --  56  BILITOT 0.6 0.6  --  0.7  PROT 6.8 7.6  --  6.0*  ALBUMIN 1.8* 2.0*  --  1.5*  INR  --   --  1.22  --      INFECTIOUS  Recent Labs Lab 06/24/15 0925 06/26/15 1518 06/26/15 1814  LATICACIDVEN  --  2.0 2.0  PROCALCITON 0.11  --   --      ENDOCRINE CBG (last 3)  No results for input(s): GLUCAP in the last 72 hours.       IMAGING x48h  - image(s) personally visualized  -   highlighted in bold Dg Chest Port 1  View  06/30/2015  CLINICAL DATA:  Respiratory failure following cardiac and respiratory arrest, history of atrial fibrillation, CHF, diabetes. EXAM: PORTABLE CHEST 1 VIEW COMPARISON:  Portable chest x-ray of June 28, 2015 FINDINGS: The lungs are well-expanded. The hemidiaphragms are obscured. There is no pneumothorax. The cardiac silhouette is mildly enlarged but stable. The pulmonary vascularity is mildly prominent centrally but also stable. The endotracheal tube tip projects 4.1 cm above the carina. The feeding tube tip projects below the inferior margin of the image. The Port-A-Cath appliance tip projects over the proximal third of the SVC. There is a fracture of the lateral aspect of the left sixth rib. IMPRESSION: 1. Stable left lower lobe atelectasis or pneumonia. Small bilateral pleural effusions. No pneumothorax. 2. CHF with mild interstitial edema, slightly improved. 3. The support apparatus are in stable position. Electronically Signed   By: David  Martinique M.D.   On: 06/30/2015 07:34     MICRO Recent Results (from the past 720 hour(s))  Blood Culture (routine x 2)     Status: None   Collection Time: 06/13/15  8:52 PM  Result Value Ref Range Status   Specimen Description BLOOD LEFT HAND  Final   Special Requests BOTTLES DRAWN AEROBIC AND ANAEROBIC 5CC  Final   Culture  Setup Time   Final    GRAM POSITIVE COCCI IN CHAINS ANAEROBIC BOTTLE ONLY CRITICAL RESULT CALLED TO, READ BACK BY AND VERIFIED WITH: S DOTY,RN AT 1330 06/15/15 BY L BENFIELD GRAM POSITIVE RODS AEROBIC BOTTLE ONLY CORRECTED RESULTS CALLED TO: C HANCOCK,RN AT 1046 06/17/15 BY L BENFIELD    Culture   Final    VIRIDANS STREPTOCOCCUS THE SIGNIFICANCE OF ISOLATING THIS ORGANISM FROM A SINGLE SET OF BLOOD CULTURES WHEN MULTIPLE SETS ARE DRAWN IS UNCERTAIN. PLEASE NOTIFY THE MICROBIOLOGY DEPARTMENT WITHIN ONE WEEK IF SPECIATION AND SENSITIVITIES ARE REQUIRED. DIPHTHEROIDS(CORYNEBACTERIUM SPECIES) Standardized susceptibility  testing for this organism is not available.    Report Status 06/19/2015 FINAL  Final  Blood Culture (routine x 2)     Status: None   Collection Time: 06/13/15  9:04 PM  Result Value Ref Range Status   Specimen Description BLOOD RIGHT WRIST  Final   Special Requests BOTTLES DRAWN AEROBIC AND ANAEROBIC 5CC  Final   Culture NO GROWTH 5 DAYS  Final   Report Status 06/18/2015 FINAL  Final  Urine culture     Status: None   Collection Time: 06/14/15 10:00  AM  Result Value Ref Range Status   Specimen Description URINE, CATHETERIZED  Final   Special Requests NONE  Final   Culture NO GROWTH 1 DAY  Final   Report Status 06/15/2015 FINAL  Final  MRSA PCR Screening     Status: None   Collection Time: 06/14/15  2:15 PM  Result Value Ref Range Status   MRSA by PCR NEGATIVE NEGATIVE Final    Comment:        The GeneXpert MRSA Assay (FDA approved for NASAL specimens only), is one component of a comprehensive MRSA colonization surveillance program. It is not intended to diagnose MRSA infection nor to guide or monitor treatment for MRSA infections.   Culture, blood (routine x 2)     Status: None   Collection Time: 06/17/15 12:58 PM  Result Value Ref Range Status   Specimen Description BLOOD RIGHT ANTECUBITAL  Final   Special Requests IN PEDIATRIC BOTTLE  3CC  Final   Culture NO GROWTH 5 DAYS  Final   Report Status 06/22/2015 FINAL  Final  Culture, blood (routine x 2)     Status: None   Collection Time: 06/17/15  1:00 PM  Result Value Ref Range Status   Specimen Description BLOOD LEFT HAND  Final   Special Requests IN PEDIATRIC BOTTLE 3CC  Final   Culture NO GROWTH 5 DAYS  Final   Report Status 06/22/2015 FINAL  Final  MRSA PCR Screening     Status: None   Collection Time: 06/17/15  6:23 PM  Result Value Ref Range Status   MRSA by PCR NEGATIVE NEGATIVE Final    Comment:        The GeneXpert MRSA Assay (FDA approved for NASAL specimens only), is one component of a comprehensive MRSA  colonization surveillance program. It is not intended to diagnose MRSA infection nor to guide or monitor treatment for MRSA infections.   Culture, blood (routine x 2)     Status: None   Collection Time: 06/24/15  9:19 AM  Result Value Ref Range Status   Specimen Description BLOOD LEFT ANTECUBITAL  Final   Special Requests BOTTLES DRAWN AEROBIC AND ANAEROBIC 5CC  Final   Culture NO GROWTH 5 DAYS  Final   Report Status 06/29/2015 FINAL  Final  Culture, blood (routine x 2)     Status: None   Collection Time: 06/24/15  9:25 AM  Result Value Ref Range Status   Specimen Description BLOOD RIGHT ANTECUBITAL  Final   Special Requests BOTTLES DRAWN AEROBIC AND ANAEROBIC  [place]   Final   Culture NO GROWTH 5 DAYS  Final   Report Status 06/29/2015 FINAL  Final  Culture, blood (routine x 2)     Status: None (Preliminary result)   Collection Time: 06/26/15 11:50 AM  Result Value Ref Range Status   Specimen Description BLOOD LEFT HAND  Final   Special Requests IN PEDIATRIC BOTTLE 0.5CC  Final   Culture NO GROWTH 3 DAYS  Final   Report Status PENDING  Incomplete  Culture, blood (routine x 2)     Status: None (Preliminary result)   Collection Time: 06/26/15  3:18 PM  Result Value Ref Range Status   Specimen Description BLOOD LEFT HAND  Final   Special Requests BOTTLES DRAWN AEROBIC AND ANAEROBIC 5CC  Final   Culture NO GROWTH 3 DAYS  Final   Report Status PENDING  Incomplete  Culture, Urine     Status: None   Collection Time: 06/26/15  6:24 PM  Result Value Ref Range Status   Specimen Description URINE, RANDOM  Final   Special Requests NONE  Final   Culture NO GROWTH 2 DAYS  Final   Report Status 06/28/2015 FINAL  Final       ASSESSMENT: Active Problems:   Cardiac arrest ([place]) 4/13 suspected mucus plugging as cause of arrest   Respiratory arrest ([place]) intubated 4/13 post arrest   Diabetes mellitus type 2, uncontrolled (HCC)   Anemia, chronic disease   Hypothyroidism   Essential  hypertension   Acute encephalopathy   Atrial fibrillation with RVR (HCC)   Acute on chronic diastolic CHF (congestive heart failure), NYHA class 3 ([place])     Discussion: ET tube 5cm above carina  She is doing SBT and perhaphs meets extubation criteria. Deconditioning might be only risk factor favoring reintubaion risk   PLAN: Wean per protocol - aim extubation 06/30/2015 or 07/01/15  B's as needed Not need for FOB currently On steroids  D/w daughter at bedside -> definitely no trach. However, she is not even sure if based on sub judgement or best interest if patient should be reintubated. She wnts to goal meet with Dr Laren Everts and her brothers about this  PCCM will revisit again in few days - call if needed  Dr. Brand Males, M.D., [facility].C.P Pulmonary and Critical Care Medicine Staff Physician [facility] Pager: [phone], If no answer or between  15:00h - 7:00h: call [phone]  06/30/2015 1:14 PM

## 2015-07-01 LAB — CBC WITH DIFFERENTIAL/PLATELET
BASOS ABS: 0 10*3/uL (ref 0.0–0.1)
Basophils Relative: 0 %
EOS PCT: 0 %
Eosinophils Absolute: 0 10*3/uL (ref 0.0–0.7)
HCT: 26.6 % — ABNORMAL LOW (ref 36.0–46.0)
HEMOGLOBIN: 9.3 g/dL — AB (ref 12.0–15.0)
Lymphocytes Relative: 9 %
Lymphs Abs: 0.8 10*3/uL (ref 0.7–4.0)
MCH: 34.1 pg — AB (ref 26.0–34.0)
MCHC: 35 g/dL (ref 30.0–36.0)
MCV: 97.4 fL (ref 78.0–100.0)
Monocytes Absolute: 0.4 10*3/uL (ref 0.1–1.0)
Monocytes Relative: 4 %
NEUTROS ABS: 7.9 10*3/uL — AB (ref 1.7–7.7)
NEUTROS PCT: 87 %
PLATELETS: 174 10*3/uL (ref 150–400)
RBC: 2.73 MIL/uL — AB (ref 3.87–5.11)
RDW: 19.3 % — ABNORMAL HIGH (ref 11.5–15.5)
WBC: 9.1 10*3/uL (ref 4.0–10.5)

## 2015-07-01 LAB — BASIC METABOLIC PANEL
ANION GAP: 10 (ref 5–15)
BUN: 43 mg/dL — ABNORMAL HIGH (ref 6–20)
CHLORIDE: 104 mmol/L (ref 101–111)
CO2: 25 mmol/L (ref 22–32)
Calcium: 8.5 mg/dL — ABNORMAL LOW (ref 8.9–10.3)
Creatinine, Ser: 0.92 mg/dL (ref 0.44–1.00)
GFR calc Af Amer: >60 mL/min (ref >60)
GLUCOSE: 234 mg/dL — AB (ref 65–99)
POTASSIUM: 4.3 mmol/L (ref 3.5–5.1)
SODIUM: 139 mmol/L (ref 135–145)

## 2015-07-01 LAB — PHOSPHORUS: Phosphorus: 3.6 mg/dL (ref 2.5–4.6)

## 2015-07-01 LAB — CULTURE, BLOOD (ROUTINE X 2)
CULTURE: NO GROWTH
Culture: NO GROWTH

## 2015-07-01 LAB — MAGNESIUM: MAGNESIUM: 2.1 mg/dL (ref 1.7–2.4)

## 2015-07-02 ENCOUNTER — Other Ambulatory Visit (HOSPITAL_COMMUNITY): Payer: Self-pay

## 2015-07-02 LAB — BASIC METABOLIC PANEL
ANION GAP: 8 (ref 5–15)
BUN: 44 mg/dL — AB (ref 6–20)
CALCIUM: 8.6 mg/dL — AB (ref 8.9–10.3)
CO2: 25 mmol/L (ref 22–32)
Chloride: 106 mmol/L (ref 101–111)
Creatinine, Ser: 0.85 mg/dL (ref 0.44–1.00)
GFR calc Af Amer: >60 mL/min (ref >60)
GLUCOSE: 96 mg/dL (ref 65–99)
Potassium: 3.9 mmol/L (ref 3.5–5.1)
SODIUM: 139 mmol/L (ref 135–145)

## 2015-07-02 LAB — CBC
HCT: 28.7 % — ABNORMAL LOW (ref 36.0–46.0)
Hemoglobin: 9.3 g/dL — ABNORMAL LOW (ref 12.0–15.0)
MCH: 29 pg (ref 26.0–34.0)
MCHC: 32.4 g/dL (ref 30.0–36.0)
MCV: 89.4 fL (ref 78.0–100.0)
PLATELETS: 144 10*3/uL — AB (ref 150–400)
RBC: 3.21 MIL/uL — AB (ref 3.87–5.11)
RDW: 17.6 % — AB (ref 11.5–15.5)
WBC: 10.1 10*3/uL (ref 4.0–10.5)

## 2015-07-02 LAB — PHOSPHORUS: PHOSPHORUS: 4.4 mg/dL (ref 2.5–4.6)

## 2015-07-02 LAB — MAGNESIUM: Magnesium: 2.2 mg/dL (ref 1.7–2.4)

## 2015-07-02 NOTE — Progress Notes (Signed)
Name: Emily Kennedy MRN: [id number] DOB: 1946/01/13    ADMISSION DATE:  06/23/2015 CONSULTATION DATE:  4/14  REFERRING MD :  [facility]  CHIEF COMPLAINT:  Resp fail  SIGNIFICANT EVENTS  4/13 cardiac arrest  STUDIES:   BRIEF MS. Luttrull is 70 yo WF, recently discharged from [facility] hospital 4/10 by Dr. Thereasa Solo [facility] to [facility] with Discharge Dx of,. Sepsis , resp failure from Streptococcus of rt lung. Legionella pna, PAF, HF,Dysphagia, hyponatremia, hypokalemia, DM 2,anemia and hypothyroidism. She required 2-4 l [place] till 4/13 when she had cardiac event from presumed resp trigger from mucus plugging. Shock and epi given along with urgent intubation with ROSC. PCCM was consulted on 4/14 for ventilator management help. Further she has AMS and is not following commands while on ventilator. Her Afib is controlled and currently in SR.  EVENTS 4/14 - Pt was admitted to [facility] for further care after being treated for legionella and pneumococcal PNA. She unfortunately had resp and then cardiac arrest presumed due to mucous plugging. She was resuscitated, intubated. Currently stable on 0.40 fio2. I believe we can proceed with transition to SBT's per [facility] protocol. Continue current abx. Push pulm hygiene and secretion clearance    SUBJECTIVE/OVERNIGHT/INTERVAL HX'.  4/19 is extubated  VITAL SIGNS: T 97.7, SBP 124/82, Pulse ox 95%RR 20     PHYSICAL EXAMINATION: General: Fail, wasted , WF, looks uncomfortable Neuro:Follws commands, weak voice, looks miserable HEENT:  O2 at 2 l/m sats 95% NGT with TF Cardiovascular:  HSR RRR  Lungs: coarse rhonchi bilaterally, + chest wall abd paradoxus  Abdomen:  + bs Musculoskeletal:  intact Skin: warm and dry   PULMONARY  Recent Labs Lab 06/26/15 0905  PHART 7.342*  PCO2ART 47.1*  PO2ART 110*  HCO3 24.9*  TCO2 26.3  O2SAT 97.2    CBC  Recent Labs Lab 06/28/15 0719 06/29/15 0716 06/30/15 0838 07/01/15 0553  HGB 6.7* 8.4* 8.6*  9.3*  HCT 21.9* 23.2* 25.5* 26.6*  WBC 9.1 9.9  --  9.1  PLT 181 174  --  174    COAGULATION  Recent Labs Lab 06/26/15 1518  INR 1.22    CARDIAC    Recent Labs Lab 06/27/15 1655 06/27/15 2221 06/28/15 0719 06/29/15 0716 06/29/15 1445  TROPONINI 0.11* 0.09* 0.11* 0.06* 0.06*   No results for input(s): PROBNP in the last 168 hours.   CHEMISTRY  Recent Labs Lab 06/26/15 0651 06/26/15 0936 06/27/15 0737 06/28/15 0719 06/29/15 0716 06/29/15 1445 06/30/15 0838 07/01/15 0553  NA 150* 151* 154* 154* 149*  --  147* 139  K 5.4* 5.8* 3.4* 3.6 3.5  --  3.9 4.3  CL 111 115* 114* 114* 111  --  112* 104  CO2 25 24 28 29 29   --  27 25  GLUCOSE 262* 269* 192* 164* 223*  --  179* 234*  BUN 47* 48* 56* 50* 47*  --  40* 43*  CREATININE 0.92 1.22* 1.36* 1.24* 1.10*  --  0.99 0.92  CALCIUM 9.4 9.2 8.5* 8.5* 8.6*  --  8.6* 8.5*  MG 2.3 2.3  --   --   --  2.1  --  2.1  PHOS 4.0 4.7*  --   --   --   --   --  3.6   Estimated Creatinine Clearance: 50.5 mL/min (by C-G formula based on Cr of 0.92).   LIVER  Recent Labs Lab 06/26/15 0936 06/26/15 1518 06/28/15 0719  AST 179*  --  30  ALT 130*  --  50  ALKPHOS 87  --  56  BILITOT 0.6  --  0.7  PROT 7.6  --  6.0*  ALBUMIN 2.0*  --  1.5*  INR  --  1.22  --      INFECTIOUS  Recent Labs Lab 06/26/15 1518 06/26/15 1814  LATICACIDVEN 2.0 2.0     ENDOCRINE CBG (last 3)  No results for input(s): GLUCAP in the last 72 hours.       IMAGING x48h  - image(s) personally visualized  -   highlighted in bold Dg Chest Port 1 View  06/30/2015  CLINICAL DATA:  Evaluate endotracheal tube position. History of pneumonia. EXAM: PORTABLE CHEST 1 VIEW COMPARISON:  06/30/2015 at 5:52 a.m. FINDINGS: Patchy left perihilar and bilateral lower lung zone airspace opacity. There is more confluent basilar opacity obscuring the hemidiaphragms. This is similar to the prior study allowing for differences in positioning and technique.  Endotracheal tube tip projects 2.5 cm above the carina, well positioned. Oral/nasogastric tube passes below the diaphragm well into the stomach. Right PICC is stable with its tip in the mid superior vena cava. No pneumothorax. Stable left lateral 6th rib fracture. IMPRESSION: 1. No significant change in lung aeration since the prior exam. Bilateral pleural effusions. Additional parenchymal opacity in the left perihilar and bilateral lung bases that may be due to atelectasis, pneumonia or a combination. 2. Support apparatus is well positioned. Electronically Signed   By: Lajean Manes M.D.   On: 06/30/2015 15:20     MICRO Recent Results (from the past 720 hour(s))  Blood Culture (routine x 2)     Status: None   Collection Time: 06/13/15  8:52 PM  Result Value Ref Range Status   Specimen Description BLOOD LEFT HAND  Final   Special Requests BOTTLES DRAWN AEROBIC AND ANAEROBIC 5CC  Final   Culture  Setup Time   Final    GRAM POSITIVE COCCI IN CHAINS ANAEROBIC BOTTLE ONLY CRITICAL RESULT CALLED TO, READ BACK BY AND VERIFIED WITH: S DOTY,RN AT 1330 06/15/15 BY L BENFIELD GRAM POSITIVE RODS AEROBIC BOTTLE ONLY CORRECTED RESULTS CALLED TO: C HANCOCK,RN AT 1046 06/17/15 BY L BENFIELD    Culture   Final    VIRIDANS STREPTOCOCCUS THE SIGNIFICANCE OF ISOLATING THIS ORGANISM FROM A SINGLE SET OF BLOOD CULTURES WHEN MULTIPLE SETS ARE DRAWN IS UNCERTAIN. PLEASE NOTIFY THE MICROBIOLOGY DEPARTMENT WITHIN ONE WEEK IF SPECIATION AND SENSITIVITIES ARE REQUIRED. DIPHTHEROIDS(CORYNEBACTERIUM SPECIES) Standardized susceptibility testing for this organism is not available.    Report Status 06/19/2015 FINAL  Final  Blood Culture (routine x 2)     Status: None   Collection Time: 06/13/15  9:04 PM  Result Value Ref Range Status   Specimen Description BLOOD RIGHT WRIST  Final   Special Requests BOTTLES DRAWN AEROBIC AND ANAEROBIC 5CC  Final   Culture NO GROWTH 5 DAYS  Final   Report Status 06/18/2015 FINAL  Final    Urine culture     Status: None   Collection Time: 06/14/15 10:00 AM  Result Value Ref Range Status   Specimen Description URINE, CATHETERIZED  Final   Special Requests NONE  Final   Culture NO GROWTH 1 DAY  Final   Report Status 06/15/2015 FINAL  Final  MRSA PCR Screening     Status: None   Collection Time: 06/14/15  2:15 PM  Result Value Ref Range Status   MRSA by PCR NEGATIVE NEGATIVE Final    Comment:  The GeneXpert MRSA Assay (FDA approved for NASAL specimens only), is one component of a comprehensive MRSA colonization surveillance program. It is not intended to diagnose MRSA infection nor to guide or monitor treatment for MRSA infections.   Culture, blood (routine x 2)     Status: None   Collection Time: 06/17/15 12:58 PM  Result Value Ref Range Status   Specimen Description BLOOD RIGHT ANTECUBITAL  Final   Special Requests IN PEDIATRIC BOTTLE  3CC  Final   Culture NO GROWTH 5 DAYS  Final   Report Status 06/22/2015 FINAL  Final  Culture, blood (routine x 2)     Status: None   Collection Time: 06/17/15  1:00 PM  Result Value Ref Range Status   Specimen Description BLOOD LEFT HAND  Final   Special Requests IN PEDIATRIC BOTTLE 3CC  Final   Culture NO GROWTH 5 DAYS  Final   Report Status 06/22/2015 FINAL  Final  MRSA PCR Screening     Status: None   Collection Time: 06/17/15  6:23 PM  Result Value Ref Range Status   MRSA by PCR NEGATIVE NEGATIVE Final    Comment:        The GeneXpert MRSA Assay (FDA approved for NASAL specimens only), is one component of a comprehensive MRSA colonization surveillance program. It is not intended to diagnose MRSA infection nor to guide or monitor treatment for MRSA infections.   Culture, blood (routine x 2)     Status: None   Collection Time: 06/24/15  9:19 AM  Result Value Ref Range Status   Specimen Description BLOOD LEFT ANTECUBITAL  Final   Special Requests BOTTLES DRAWN AEROBIC AND ANAEROBIC 5CC  Final   Culture NO  GROWTH 5 DAYS  Final   Report Status 06/29/2015 FINAL  Final  Culture, blood (routine x 2)     Status: None   Collection Time: 06/24/15  9:25 AM  Result Value Ref Range Status   Specimen Description BLOOD RIGHT ANTECUBITAL  Final   Special Requests BOTTLES DRAWN AEROBIC AND ANAEROBIC  [place]   Final   Culture NO GROWTH 5 DAYS  Final   Report Status 06/29/2015 FINAL  Final  Culture, blood (routine x 2)     Status: None   Collection Time: 06/26/15 11:50 AM  Result Value Ref Range Status   Specimen Description BLOOD LEFT HAND  Final   Special Requests IN PEDIATRIC BOTTLE 0.5CC  Final   Culture NO GROWTH 5 DAYS  Final   Report Status 07/01/2015 FINAL  Final  Culture, blood (routine x 2)     Status: None   Collection Time: 06/26/15  3:18 PM  Result Value Ref Range Status   Specimen Description BLOOD LEFT HAND  Final   Special Requests BOTTLES DRAWN AEROBIC AND ANAEROBIC 5CC  Final   Culture NO GROWTH 5 DAYS  Final   Report Status 07/01/2015 FINAL  Final  Culture, Urine     Status: None   Collection Time: 06/26/15  6:24 PM  Result Value Ref Range Status   Specimen Description URINE, RANDOM  Final   Special Requests NONE  Final   Culture NO GROWTH 2 DAYS  Final   Report Status 06/28/2015 FINAL  Final       ASSESSMENT: Active Problems:   Cardiac arrest ([place]) 4/13 suspected mucus plugging as cause of arrest   Respiratory arrest ([place]) intubated 4/13 post arrest   Diabetes mellitus type 2, uncontrolled (HCC)   Anemia, chronic disease  Hypothyroidism   Essential hypertension   Acute encephalopathy   Atrial fibrillation with RVR (HCC)   Acute on chronic diastolic CHF (congestive heart failure), NYHA class 3 ([place])     Discussion: Extubated 4/18, 4/19 increased wob.     PLAN: Extubated 4/18 and on 4/19 has increased WOB, Abd chest wall paradoxus and is a full code. We will follow and she may need reintuabation. Dont think she will toletae NIMVS B's as needed On steroids  D/w  daughter at bedside -> definitely no trach. However, she is not even sure if based on sub judgement or best interest if patient should be reintubated.   Richardson Landry Minor ACNP [facility] PCCM Pager [phone] till 3 pm If no answer page [phone] 07/02/2015, 9:46 AM

## 2015-07-03 ENCOUNTER — Other Ambulatory Visit (HOSPITAL_COMMUNITY): Payer: Self-pay

## 2015-07-04 DIAGNOSIS — J9601 Acute respiratory failure with hypoxia: Secondary | ICD-10-CM | POA: Diagnosis not present

## 2015-07-04 LAB — BASIC METABOLIC PANEL
ANION GAP: 10 (ref 5–15)
BUN: 47 mg/dL — AB (ref 6–20)
CHLORIDE: 103 mmol/L (ref 101–111)
CO2: 25 mmol/L (ref 22–32)
Calcium: 8.7 mg/dL — ABNORMAL LOW (ref 8.9–10.3)
Creatinine, Ser: 0.83 mg/dL (ref 0.44–1.00)
GFR calc Af Amer: >60 mL/min (ref >60)
GFR calc non Af Amer: >60 mL/min (ref >60)
GLUCOSE: 211 mg/dL — AB (ref 65–99)
POTASSIUM: 4.3 mmol/L (ref 3.5–5.1)
SODIUM: 138 mmol/L (ref 135–145)

## 2015-07-04 LAB — CBC WITH DIFFERENTIAL/PLATELET
BASOS ABS: 0 10*3/uL (ref 0.0–0.1)
Basophils Relative: 0 %
Eosinophils Absolute: 0 10*3/uL (ref 0.0–0.7)
Eosinophils Relative: 0 %
HCT: 25.2 % — ABNORMAL LOW (ref 36.0–46.0)
HEMOGLOBIN: 9 g/dL — AB (ref 12.0–15.0)
LYMPHS PCT: 12 %
Lymphs Abs: 1 10*3/uL (ref 0.7–4.0)
MCH: 34.1 pg — AB (ref 26.0–34.0)
MCHC: 35.7 g/dL (ref 30.0–36.0)
MCV: 95.5 fL (ref 78.0–100.0)
Monocytes Absolute: 0.3 10*3/uL (ref 0.1–1.0)
Monocytes Relative: 4 %
NEUTROS PCT: 84 %
Neutro Abs: 6.9 10*3/uL (ref 1.7–7.7)
Platelets: 152 10*3/uL (ref 150–400)
RBC: 2.64 MIL/uL — AB (ref 3.87–5.11)
RDW: 18.1 % — ABNORMAL HIGH (ref 11.5–15.5)
WBC: 8.3 10*3/uL (ref 4.0–10.5)

## 2015-07-04 LAB — PHOSPHORUS: Phosphorus: 4.6 mg/dL (ref 2.5–4.6)

## 2015-07-04 LAB — MAGNESIUM: MAGNESIUM: 2.4 mg/dL (ref 1.7–2.4)

## 2015-07-06 LAB — CBC WITH DIFFERENTIAL/PLATELET
BASOS ABS: 0 10*3/uL (ref 0.0–0.1)
BASOS PCT: 0 %
EOS ABS: 0 10*3/uL (ref 0.0–0.7)
EOS PCT: 0 %
HCT: 25.1 % — ABNORMAL LOW (ref 36.0–46.0)
Hemoglobin: 8.5 g/dL — ABNORMAL LOW (ref 12.0–15.0)
Lymphocytes Relative: 13 %
Lymphs Abs: 0.9 10*3/uL (ref 0.7–4.0)
MCH: 32.7 pg (ref 26.0–34.0)
MCHC: 33.9 g/dL (ref 30.0–36.0)
MCV: 96.5 fL (ref 78.0–100.0)
MONO ABS: 0.4 10*3/uL (ref 0.1–1.0)
Monocytes Relative: 6 %
Neutro Abs: 5.4 10*3/uL (ref 1.7–7.7)
Neutrophils Relative %: 81 %
PLATELETS: 203 10*3/uL (ref 150–400)
RBC: 2.6 MIL/uL — AB (ref 3.87–5.11)
RDW: 17.7 % — AB (ref 11.5–15.5)
WBC: 6.7 10*3/uL (ref 4.0–10.5)

## 2015-07-06 LAB — BASIC METABOLIC PANEL
ANION GAP: 10 (ref 5–15)
BUN: 44 mg/dL — ABNORMAL HIGH (ref 6–20)
CALCIUM: 8.9 mg/dL (ref 8.9–10.3)
CO2: 28 mmol/L (ref 22–32)
Chloride: 102 mmol/L (ref 101–111)
Creatinine, Ser: 0.74 mg/dL (ref 0.44–1.00)
GLUCOSE: 130 mg/dL — AB (ref 65–99)
Potassium: 4.3 mmol/L (ref 3.5–5.1)
SODIUM: 140 mmol/L (ref 135–145)

## 2015-07-08 LAB — BASIC METABOLIC PANEL
Anion gap: 9 (ref 5–15)
BUN: 38 mg/dL — ABNORMAL HIGH (ref 6–20)
CALCIUM: 8.7 mg/dL — AB (ref 8.9–10.3)
CHLORIDE: 101 mmol/L (ref 101–111)
CO2: 29 mmol/L (ref 22–32)
Creatinine, Ser: 0.79 mg/dL (ref 0.44–1.00)
GFR calc Af Amer: >60 mL/min (ref >60)
GFR calc non Af Amer: >60 mL/min (ref >60)
Glucose, Bld: 100 mg/dL — ABNORMAL HIGH (ref 65–99)
POTASSIUM: 3.9 mmol/L (ref 3.5–5.1)
Sodium: 139 mmol/L (ref 135–145)

## 2015-07-08 LAB — CBC WITH DIFFERENTIAL/PLATELET
BASOS ABS: 0 10*3/uL (ref 0.0–0.1)
Basophils Relative: 0 %
EOS PCT: 1 %
Eosinophils Absolute: 0.1 10*3/uL (ref 0.0–0.7)
HEMATOCRIT: 25.1 % — AB (ref 36.0–46.0)
Hemoglobin: 8.4 g/dL — ABNORMAL LOW (ref 12.0–15.0)
LYMPHS ABS: 0.7 10*3/uL (ref 0.7–4.0)
LYMPHS PCT: 10 %
MCH: 32.1 pg (ref 26.0–34.0)
MCHC: 33.5 g/dL (ref 30.0–36.0)
MCV: 95.8 fL (ref 78.0–100.0)
MONO ABS: 0.6 10*3/uL (ref 0.1–1.0)
MONOS PCT: 8 %
Neutro Abs: 6.2 10*3/uL (ref 1.7–7.7)
Neutrophils Relative %: 81 %
PLATELETS: 235 10*3/uL (ref 150–400)
RBC: 2.62 MIL/uL — ABNORMAL LOW (ref 3.87–5.11)
RDW: 17.3 % — AB (ref 11.5–15.5)
WBC: 7.6 10*3/uL (ref 4.0–10.5)

## 2015-07-08 LAB — MAGNESIUM: Magnesium: 2.2 mg/dL (ref 1.7–2.4)

## 2015-07-08 LAB — TSH: TSH: 16.997 u[IU]/mL — AB (ref 0.350–4.500)

## 2015-07-08 LAB — PHOSPHORUS: PHOSPHORUS: 3.9 mg/dL (ref 2.5–4.6)

## 2015-07-08 LAB — HEMOGLOBIN A1C
Hgb A1c MFr Bld: 4.6 % — ABNORMAL LOW (ref 4.8–5.6)
MEAN PLASMA GLUCOSE: 85 mg/dL

## 2015-07-09 ENCOUNTER — Other Ambulatory Visit (HOSPITAL_COMMUNITY): Payer: Self-pay

## 2015-07-09 LAB — HEMOGLOBIN A1C
Hgb A1c MFr Bld: 4.5 % — ABNORMAL LOW (ref 4.8–5.6)
Mean Plasma Glucose: 82 mg/dL

## 2015-07-10 LAB — CBC WITH DIFFERENTIAL/PLATELET
Basophils Absolute: 0 10*3/uL (ref 0.0–0.1)
Basophils Relative: 0 %
EOS ABS: 0.2 10*3/uL (ref 0.0–0.7)
EOS PCT: 2 %
HCT: 23.5 % — ABNORMAL LOW (ref 36.0–46.0)
Hemoglobin: 8.2 g/dL — ABNORMAL LOW (ref 12.0–15.0)
LYMPHS ABS: 0.8 10*3/uL (ref 0.7–4.0)
Lymphocytes Relative: 10 %
MCH: 34.3 pg — AB (ref 26.0–34.0)
MCHC: 34.9 g/dL (ref 30.0–36.0)
MCV: 98.3 fL (ref 78.0–100.0)
MONO ABS: 0.3 10*3/uL (ref 0.1–1.0)
MONOS PCT: 4 %
Neutro Abs: 6.4 10*3/uL (ref 1.7–7.7)
Neutrophils Relative %: 84 %
PLATELETS: 228 10*3/uL (ref 150–400)
RBC: 2.39 MIL/uL — ABNORMAL LOW (ref 3.87–5.11)
RDW: 17.3 % — ABNORMAL HIGH (ref 11.5–15.5)
WBC: 7.6 10*3/uL (ref 4.0–10.5)

## 2015-07-10 LAB — BASIC METABOLIC PANEL
Anion gap: 7 (ref 5–15)
BUN: 35 mg/dL — AB (ref 6–20)
CO2: 30 mmol/L (ref 22–32)
Calcium: 8.5 mg/dL — ABNORMAL LOW (ref 8.9–10.3)
Chloride: 104 mmol/L (ref 101–111)
Creatinine, Ser: 0.75 mg/dL (ref 0.44–1.00)
GFR calc Af Amer: >60 mL/min (ref >60)
GLUCOSE: 116 mg/dL — AB (ref 65–99)
Potassium: 4.2 mmol/L (ref 3.5–5.1)
SODIUM: 141 mmol/L (ref 135–145)

## 2015-07-10 LAB — PHOSPHORUS: Phosphorus: 3.4 mg/dL (ref 2.5–4.6)

## 2015-07-10 LAB — MAGNESIUM: MAGNESIUM: 2.2 mg/dL (ref 1.7–2.4)

## 2015-07-10 NOTE — Consult Note (Signed)
Chief Complaint: Patient was seen in consultation today for percutaneous gastric tube placement at the request of Dr Merton Border  Referring Physician(s): Dr Merton Border  Supervising Physician: Aletta Edouard  Patient Status: In-pt   History of Present Illness: Emily Kennedy is a 70 y.o. female    Admitted with Altered mental status Dx Bact PNA Respiratory failure Transferred to [facility] 06/13/2015 Encephalopathy Protein calorie malnutrition Deconditioning Dysphagia NG in place Request for percutaneous gastric tube placement --- need for long term care [facility] has been reviewed per Dr Vira Blanco procedure  Labs ok afeb  Past Medical History  Diagnosis Date  . Diabetes mellitus   . Vasculitis ([place])   . Hypertension   . Thyroid disease     Past Surgical History  Procedure Laterality Date  . Tubal ligation    . Melanoma excision      Allergies: Benadryl; Ciprofloxacin; Furosemide; Nitrofurantoin monohyd macro; Septra; Sulfa drugs cross reactors; and Ceclor  Medications: Prior to Admission medications   Medication Sig Start Date End Date Taking? Authorizing Provider  acetaminophen (TYLENOL) 160 MG/5ML solution Place 20.3 mLs (650 mg total) into feeding tube every 6 (six) hours as needed for mild pain, headache or fever. 06/23/15   Cherene Altes, MD  acetaminophen (TYLENOL) 650 MG suppository Place 1 suppository (650 mg total) rectally every 4 (four) hours as needed for mild pain or fever. 06/23/15   Cherene Altes, MD  antiseptic oral rinse (CPC / CETYLPYRIDINIUM CHLORIDE 0.05%) 0.05 % LIQD solution 7 mLs by Mouth Rinse route 2 times daily at 12 noon and 4 pm. 06/23/15   Cherene Altes, MD  carvedilol (COREG) 25 MG tablet Place 1 tablet (25 mg total) into feeding tube 2 (two) times daily. 06/23/15   Cherene Altes, MD  chlorhexidine (PERIDEX) 0.12 % solution 15 mLs by Mouth Rinse route 2 (two) times daily. 06/23/15   Cherene Altes, MD  diltiazem  (CARDIZEM) 10 mg/ml oral suspension Place 6 mLs (60 mg total) into feeding tube every 8 (eight) hours. 06/23/15   Cherene Altes, MD  enoxaparin (LOVENOX) 40 MG/0.4ML injection Inject 0.4 mLs (40 mg total) into the skin daily. 06/23/15   Cherene Altes, MD  furosemide (LASIX) 40 MG tablet Place 1 tablet (40 mg total) into feeding tube 2 (two) times daily. 06/23/15   Cherene Altes, MD  insulin aspart (NOVOLOG) 100 UNIT/ML injection Inject 0-9 Units into the skin every 4 (four) hours. 06/23/15   Cherene Altes, MD  insulin glargine (LANTUS) 100 UNIT/ML injection Inject 0.12 mLs (12 Units total) into the skin daily. 06/23/15   Cherene Altes, MD  levothyroxine (SYNTHROID, LEVOTHROID) 100 MCG SOLR injection Inject 3.75 mLs (75 mcg total) into the vein daily. 06/23/15   Cherene Altes, MD  Nutritional Supplements (FEEDING SUPPLEMENT, GLUCERNA 1.2 CAL,) LIQD Place 1,000 mLs into feeding tube continuous. 06/23/15   Cherene Altes, MD  ondansetron [facility]) 4 MG/2ML SOLN injection Inject 2 mLs (4 mg total) into the vein every 6 (six) hours as needed for nausea. 06/23/15   Cherene Altes, MD  potassium chloride 20 MEQ/15ML (10%) SOLN Place 30 mLs (40 mEq total) into feeding tube 2 (two) times daily. 06/23/15   Cherene Altes, MD  predniSONE (DELTASONE) 20 MG tablet Place 2 tablets (40 mg total) into feeding tube daily with breakfast. 06/24/15   Cherene Altes, MD  sodium chloride flush (NS) 0.9 % SOLN Inject 3 mLs  into the vein every 12 (twelve) hours. 06/23/15   Cherene Altes, MD     Family History  Problem Relation Age of Onset  . Cancer Sister     Social History   Social History  . Marital Status: Divorced    Spouse Name: N/A  . Number of Children: N/A  . Years of Education: N/A   Occupational History  . Retired    Social History Main Topics  . Smoking status: Never Smoker   . Smokeless tobacco: Never Used  . Alcohol Use: No  . Drug Use: No  . Sexual Activity: Not  on file   Other Topics Concern  . Not on file   Social History Narrative     Review of Systems: A 12 point ROS discussed and pertinent positives are indicated in the HPI above.  All other systems are negative.  Review of Systems  Constitutional: Positive for activity change and appetite change. Negative for fever.  Respiratory: Negative for choking.   Neurological: Positive for weakness.  Psychiatric/Behavioral: Positive for behavioral problems, confusion and decreased concentration.    Vital Signs: There were no vitals taken for this visit.  Physical Exam  Cardiovascular: Normal rate, regular rhythm and normal heart sounds.   Pulmonary/Chest: Effort normal. She has wheezes.  Abdominal: Soft. Bowel sounds are normal.  Musculoskeletal:  No response  Skin: Skin is warm and dry.  Psychiatric:  Consented with son at bedside  Nursing note and vitals reviewed.   Mallampati Score:  MD Evaluation Airway: WNL Heart: WNL Abdomen: WNL Chest/ Lungs: WNL ASA  Classification: 3 Mallampati/Airway Score: Two  Imaging: Ct Abdomen Wo Contrast  07/09/2015  CLINICAL DATA:  Dysphagia. Evaluate anatomy prior to potential gastrostomy tube placement EXAM: CT ABDOMEN WITHOUT CONTRAST TECHNIQUE: Multidetector CT imaging of the abdomen was performed following the standard protocol without IV contrast. COMPARISON:  CT abdomen pelvis - 07/17/2009 FINDINGS: Lower chest: Limited visualization of lower thorax demonstrates small to moderate size bilateral effusions with associated compressive atelectasis of the imaged bilateral lower lobes. Air bronchograms are seen within the bilateral lower lobes and right middle lobe. Several ill-defined nodules are seen within the imaged aerated portion of the right middle lobe with dominant nodule measuring 5 mm in diameter (image 8, series 4). Borderline cardiomegaly. There is diffuse decreased attenuation of the intra cardiac blood pool suggestive of anemia. Small  amount of pericardial fluid, presumably physiologic. Hepatobiliary: Suspected mild nodularity of the hepatic contour. Normal noncontrast appearance of the gallbladder. No radiopaque gallstones. No ascites. Pancreas: Normal noncontrast appearance of the pancreas. Spleen: Normal noncontrast appearance of the spleen Adrenals/Urinary Tract: No renal stones. There is a minimal amount of bilateral perinephric stranding, likely secondary to patient body habitus. No urinary obstruction. Normal noncontrast appearance the bilateral adrenal glands. Stomach/Bowel: Enteric tube tip terminates within the descending portion of the duodenum. While the transverse colon is slightly high riding, there is a small window involving the ventral aspect of the gastric antrum which may allow for percutaneous gastrostomy tube placement (representative axial image 45, series 2, sagittal image 91, series 6). Normal noncontrast appearance of the imaged loops of bowel. No pneumoperitoneum, pneumatosis or portal venous gas. Vascular/Lymphatic: Minimal amount of scattered eccentric calcified plaque within a normal caliber abdominal aorta. Other: Diffuse body wall anasarca. Musculoskeletal: No acute or aggressive osseous abnormalities. There is partial ossification of the T10-T11 intervertebral discs spur. IMPRESSION: 1. Slightly high-riding transverse colon with potential small percutaneous window to allow for gastrostomy tube placement, which  may improve with gastric insufflation. Peri-procedural contrast enema could be performed as indicated. 2. Findings suggestive of pulmonary edema with small to moderate size bilateral effusions and diffuse body wall anasarca. 3. Consolidative opacities and air bronchograms within the imaged bilateral lung bases, potentially atelectasis though underlying infection not excluded. 4. Indeterminate pulmonary nodules within the imaged right middle lobe, the largest of which measures 5 mm in diameter. No follow-up  needed if patient is low-risk (and has no known or suspected primary neoplasm). Non-contrast chest CT can be considered in 12 months if patient is high-risk. This recommendation follows the consensus statement: Guidelines for Management of Incidental Pulmonary Nodules Detected on CT Images:From the Fleischner Society 2017; published online before print (10.1148/radiol.IJ:2314499). Electronically Signed   By: Sandi Mariscal M.D.   On: 07/09/2015 15:49   Dg Chest 2 View  06/16/2015  CLINICAL DATA:  Shortness of breath.  Weakness. EXAM: CHEST  2 VIEW COMPARISON:  06/13/2015 FINDINGS: Improved but not resolved interstitial and airspace opacities in both lungs, more confluent at the lung bases, with Kerley B-lines, right greater than left interstitial accentuation, and blunting of both costophrenic angles compatible with small to moderate bilateral pleural effusions. The previous airspace edema which was asymmetric to the right side has significantly improved in the remaining edema is primarily interstitial. Mild enlargement of the cardiopericardial silhouette is present. IMPRESSION: 1. Improved congestive heart failure, with residual interstitial edema and bilateral small to moderate pleural effusions with passive atelectasis, but with resolution of most of the airspace edema previously seen. 2. Mild enlargement of the cardiopericardial silhouette. Electronically Signed   By: Van Clines M.D.   On: 06/16/2015 08:53   Ct Head Wo Contrast  06/19/2015  CLINICAL DATA:  Altered mental status EXAM: CT HEAD WITHOUT CONTRAST TECHNIQUE: Contiguous axial images were obtained from the base of the skull through the vertex without intravenous contrast. COMPARISON:  None. FINDINGS: Mild motion artifact is noted. The bony calvarium is intact. Mild atrophic changes and chronic white matter ischemic changes are seen. No findings to suggest acute hemorrhage, acute infarction or space-occupying mass lesion are noted. IMPRESSION:  Chronic atrophic and ischemic changes without acute abnormality. Electronically Signed   By: Inez Catalina M.D.   On: 06/19/2015 11:15   US Abdomen Complete  06/27/2015  CLINICAL DATA:  Transaminitis EXAM: ABDOMEN ULTRASOUND COMPLETE COMPARISON:  03/20/2014 FINDINGS: Gallbladder: Layering sludge within the gallbladder. Small amount of pericholecystic fluid. No wall thickening. Negative sonographic Murphy's. Common bile duct: Diameter: Normal caliber, 3 mm Liver: No focal lesion identified. Within normal limits in parenchymal echogenicity. IVC: No abnormality visualized. Pancreas: Visualized portion unremarkable. Spleen: Borderline spleen size at 12 cm in craniocaudal length, stable since prior study. Right Kidney: Length: 11.3 cm. Echogenicity within normal limits. No mass or hydronephrosis visualized. Left Kidney: Length: 10.9 cm. Echogenicity within normal limits. No mass or hydronephrosis visualized. Abdominal aorta: No aneurysm visualized. Other findings: Incidentally noted is a right pleural effusion. IMPRESSION: Layering sludge within the gallbladder. Small amount of pericholecystic fluid. No stones, wall thickening or sonographic Murphy's sign. Stable borderline spleen size. Right pleural effusion. Electronically Signed   By: Rolm Baptise M.D.   On: 06/27/2015 08:49   Dg Chest Port 1 View  07/02/2015  CLINICAL DATA:  Respiratory failure EXAM: PORTABLE CHEST 1 VIEW COMPARISON:  June 30, 2015 FINDINGS: Endotracheal tube is no longer appreciable. Feeding tube tip is below the diaphragm. Central catheter tip is in the superior vena cava. No pneumothorax. There are bilateral pleural  effusions. There is airspace consolidation in the left lower lobe. A lesser degree of airspace consolidation is noted on the right. Heart is upper normal in size with pulmonary vascularity within normal limits. IMPRESSION: Tube and catheter positions as described without pneumothorax. Bilateral effusions with bilateral lower lobe  airspace opacity, more severe on the left than on the right, stable. Stable cardiac silhouette. Electronically Signed   By: Lowella Grip III M.D.   On: 07/02/2015 10:41   Dg Chest Port 1 View  06/30/2015  CLINICAL DATA:  Evaluate endotracheal tube position. History of pneumonia. EXAM: PORTABLE CHEST 1 VIEW COMPARISON:  06/30/2015 at 5:52 a.m. FINDINGS: Patchy left perihilar and bilateral lower lung zone airspace opacity. There is more confluent basilar opacity obscuring the hemidiaphragms. This is similar to the prior study allowing for differences in positioning and technique. Endotracheal tube tip projects 2.5 cm above the carina, well positioned. Oral/nasogastric tube passes below the diaphragm well into the stomach. Right PICC is stable with its tip in the mid superior vena cava. No pneumothorax. Stable left lateral 6th rib fracture. IMPRESSION: 1. No significant change in lung aeration since the prior exam. Bilateral pleural effusions. Additional parenchymal opacity in the left perihilar and bilateral lung bases that may be due to atelectasis, pneumonia or a combination. 2. Support apparatus is well positioned. Electronically Signed   By: Lajean Manes M.D.   On: 06/30/2015 15:20   Dg Chest Port 1 View  06/30/2015  CLINICAL DATA:  Respiratory failure following cardiac and respiratory arrest, history of atrial fibrillation, CHF, diabetes. EXAM: PORTABLE CHEST 1 VIEW COMPARISON:  Portable chest x-ray of June 28, 2015 FINDINGS: The lungs are well-expanded. The hemidiaphragms are obscured. There is no pneumothorax. The cardiac silhouette is mildly enlarged but stable. The pulmonary vascularity is mildly prominent centrally but also stable. The endotracheal tube tip projects 4.1 cm above the carina. The feeding tube tip projects below the inferior margin of the image. The Port-A-Cath appliance tip projects over the proximal third of the SVC. There is a fracture of the lateral aspect of the left sixth  rib. IMPRESSION: 1. Stable left lower lobe atelectasis or pneumonia. Small bilateral pleural effusions. No pneumothorax. 2. CHF with mild interstitial edema, slightly improved. 3. The support apparatus are in stable position. Electronically Signed   By: David  Martinique M.D.   On: 06/30/2015 07:34   Dg Chest Port 1 View  06/28/2015  CLINICAL DATA:  70 year old female with a history of sepsis, respiratory failure EXAM: PORTABLE CHEST 1 VIEW COMPARISON:  Multiple prior, most recent 06/27/2015 FINDINGS: Cardiomediastinal silhouette unchanged, partially obscured by overlying lung and pleural disease. Basilar opacities obscuring the retrocardiac region, with slightly worsened a interstitial airspace opacity at the right base obscuring the right hemidiaphragm. No visualized pneumothorax. Unchanged position of the endotracheal tube, terminating suitably above the carina. Unchanged gastric tube terminating out of the field of view. Unchanged right upper extremity PICC. Interval removal of the defibrillator pads. IMPRESSION: Slightly worsened interstitial airspace disease at the right base, with persisting left basilar opacity, potentially representing combination of consolidation, edema, pleural effusion, and/ or atelectasis. Unchanged support apparatus. Interval removal of the defibrillator pads. Signed, Dulcy Fanny. Earleen Newport, DO [facility] Electronically Signed   By: Corrie Mckusick D.O.   On: 06/28/2015 10:04   Dg Chest Port 1 View  06/27/2015  CLINICAL DATA:  Respiratory failure. EXAM: PORTABLE CHEST 1 VIEW COMPARISON:  June 26, 2015. FINDINGS: Stable cardiomediastinal silhouette. Endotracheal and  feeding tubes are unchanged in position. Right-sided PICC line is unchanged in position. Stable bilateral basilar opacities are noted concerning for edema or atelectasis with associated pleural effusions. Continued presence of mildly displaced left rib fracture. No  pneumothorax is noted. IMPRESSION: Stable support apparatus. Stable bibasilar opacities as described above. Electronically Signed   By: Marijo Conception, M.D.   On: 06/27/2015 09:17   Dg Chest Port 1 View  06/26/2015  CLINICAL DATA:  Right PICC placement.  Initial encounter. EXAM: PORTABLE CHEST 1 VIEW COMPARISON:  Chest radiograph performed earlier today at 10:15 a.m. FINDINGS: The patient's endotracheal tube is seen ending 4 cm above the carina. A right PICC is noted ending about the mid to distal SVC. An enteric tube is noted extending below the diaphragm. Small bilateral pleural effusions are seen. Bibasilar airspace opacities raise concern for pulmonary edema. Previously noted right apical opacity has improved, though superimposed pneumonia cannot be excluded. No pneumothorax is seen. The cardiomediastinal silhouette is borderline enlarged. A mildly displaced left lateral sixth rib fracture is again noted. IMPRESSION: 1. Endotracheal tube seen ending 4 cm above the carina. 2. Right PICC noted ending about the mid to distal SVC. 3. Small bilateral pleural effusions. Bibasilar airspace opacities raise concern for pulmonary edema. Previously noted right apical opacity has improved, though superimposed pneumonia cannot be excluded. 4. Borderline cardiomegaly. 5. Mildly displaced left lateral sixth rib fracture again noted. Electronically Signed   By: Garald Balding M.D.   On: 06/26/2015 21:19   Dg Chest Port 1 View  06/26/2015  CLINICAL DATA:  Endotracheal tube placement. EXAM: PORTABLE CHEST 1 VIEW COMPARISON:  Same day. FINDINGS: Stable cardiomediastinal silhouette. Feeding tube is seen entering stomach. Interval placement of endotracheal tube, which is projected over tracheal air shadow with distal tip 5 cm above the carina. No pneumothorax is noted. Stable bilateral lung opacities are noted concerning for edema. Bony thorax is unremarkable. IMPRESSION: Endotracheal tube in grossly good position. Stable  bilateral lung opacities as described above. Electronically Signed   By: Marijo Conception, M.D.   On: 06/26/2015 10:35   Dg Chest Port 1 View  06/26/2015  CLINICAL DATA:  Pneumonia.  Shortness of breath. EXAM: PORTABLE CHEST 1 VIEW COMPARISON:  06/24/2015. FINDINGS: Feeding tube in stable position. Cardiomegaly with pulmonary vascular prominence and diffuse bilateral pulmonary infiltrates and pleural effusions consistent with congestive heart failure. Interim worsening from prior exam. IMPRESSION: 1. Feeding tube in stable position. 2. Worsening of congestive heart failure and pulmonary edema. Bilateral pleural effusions again noted . Electronically Signed   By: Marcello Moores  Register   On: 06/26/2015 08:06   Dg Chest Port 1 View  06/24/2015  CLINICAL DATA:  Respiratory failure and pneumonia EXAM: PORTABLE CHEST 1 VIEW COMPARISON:  June 22, 2015 FINDINGS: The heart size is enlarged. The mediastinal contour is unchanged. There is pulmonary edema. Consolidation of bilateral lung bases with bilateral pleural effusions are noted. Feeding tube is identified with distal tip not included on film. The visualized skeletal structures are stable. IMPRESSION: Moderate to severe pulmonary edema. Patchy consolidation bilateral lung bases at least in part due to atelectasis but underlying pneumonia is not excluded. Bilateral pleural effusions. Cardiomegaly. Electronically Signed   By: Abelardo Diesel M.D.   On: 06/24/2015 10:26   Dg Chest Port 1 View  06/22/2015  CLINICAL DATA:  Pneumonia and Legionella. EXAM: PORTABLE CHEST 1 VIEW COMPARISON:  4/4/ 17 FINDINGS: The feeding tube tip is below the field of view. Mild cardiac enlargement.  Bilateral pleural effusions and moderate interstitial edema is identified compatible with CHF. IMPRESSION: 1. No change in CHF pattern compared with previous exam Electronically Signed   By: Kerby Moors M.D.   On: 06/22/2015 10:55   Dg Chest Port 1 View  06/17/2015  CLINICAL DATA:  Acute onset  tachycardia today.  Initial encounter. EXAM: PORTABLE CHEST 1 VIEW COMPARISON:  PA and lateral chest 06/16/2015. FINDINGS: Extensive bilateral airspace disease seen on yesterday's examination is worsened. There is cardiomegaly. Bilateral pleural effusions are identified, greater on the right. No pneumothorax. IMPRESSION: Appearance of the chest most consistent with congestive heart failure with associated pleural effusions. Aeration is worsened since yesterday's exam. Electronically Signed   By: Inge Rise M.D.   On: 06/17/2015 18:31   Dg Chest Portable 1 View  06/13/2015  CLINICAL DATA:  Acute onset of shortness of breath and generalized weakness. Initial encounter. EXAM: PORTABLE CHEST 1 VIEW COMPARISON:  None. FINDINGS: The lungs are well-aerated. Diffuse right-sided airspace opacification raises concern for pneumonia, though asymmetric pulmonary edema could have a similar appearance. Mild vascular congestion is noted. There is no evidence of pleural effusion or pneumothorax. The cardiomediastinal silhouette is mildly enlarged. No acute osseous abnormalities are seen. IMPRESSION: Diffuse right-sided airspace opacification raises concern for pneumonia, though asymmetric pulmonary edema could have a similar appearance. Mild vascular congestion and mild cardiomegaly noted. Electronically Signed   By: Garald Balding M.D.   On: 06/13/2015 22:25   Dg Abd Portable 1v  06/23/2015  CLINICAL DATA:  Nasogastric tube placement.  Initial encounter. EXAM: PORTABLE ABDOMEN - 1 VIEW COMPARISON:  Abdominal radiograph performed 06/20/2015 FINDINGS: The patient's enteric tube is seen ending about the pylorus. The patient's bowel gas pattern is grossly unremarkable, though incompletely characterized. No acute osseous abnormalities are seen. No free intra-abdominal air is identified, though evaluation for free air is limited on a single supine view. Small bilateral pleural effusions are seen. IMPRESSION: Enteric tube  noted ending about the pylorus. Electronically Signed   By: Garald Balding M.D.   On: 06/23/2015 23:23   Dg Abd Portable 1v  06/20/2015  CLINICAL DATA:  Feeding tube placement EXAM: PORTABLE ABDOMEN - 1 VIEW COMPARISON:  None. FINDINGS: Feeding tube tip is in the distal stomach near the pylorus. Bowel gas pattern is normal. There is consolidation in the medial left lung base. IMPRESSION: Feeding tube tip in distal stomach near the pylorus. Bowel gas pattern unremarkable. Consolidation medial left lung base. Electronically Signed   By: Lowella Grip III M.D.   On: 06/20/2015 14:35    Labs:  CBC:  Recent Labs  07/04/15 0545 07/06/15 0520 07/08/15 0630 07/10/15 0630  WBC 8.3 6.7 7.6 7.6  HGB 9.0* 8.5* 8.4* 8.2*  HCT 25.2* 25.1* 25.1* 23.5*  PLT 152 203 235 228    COAGS:  Recent Labs  06/26/15 1518  INR 1.22    BMP:  Recent Labs  07/04/15 0545 07/06/15 0520 07/08/15 0630 07/10/15 0630  NA 138 140 139 141  K 4.3 4.3 3.9 4.2  CL 103 102 101 104  CO2 25 28 29 30   GLUCOSE 211* 130* 100* 116*  BUN 47* 44* 38* 35*  CALCIUM 8.7* 8.9 8.7* 8.5*  CREATININE 0.83 0.74 0.79 0.75  GFRNONAA >60 >60 >60 >60  GFRAA >60 >60 >60 >60    LIVER FUNCTION TESTS:  Recent Labs  06/23/15 0241 06/24/15 0925 06/26/15 0936 06/28/15 0719  BILITOT 0.6 0.6 0.6 0.7  AST 49* 44* 179* 30  ALT 35 38 130* 50  ALKPHOS 74 63 87 56  PROT 6.9 6.8 7.6 6.0*  ALBUMIN 1.7* 1.8* 2.0* 1.5*    TUMOR MARKERS: No results for input(s): AFPTM, CEA, CA199, CHROMGRNA in the last 8760 hours.  Assessment and Plan:  PNA Hypoxic resp failure Deconditioning.; dysphagia; long term care Scheduled for perc G tube placement in IR 4/28 Risks and Benefits discussed with the patient's son including, but not limited to the need for a barium enema during the procedure, bleeding, infection, peritonitis, or damage to adjacent structures. All of his questions were answered, he is agreeable to proceed. Consent  signed and in chart.   Thank you for this interesting consult.  I greatly enjoyed meeting Janelys Clarice Scherff and look forward to participating in their care.  A copy of this report was sent to the requesting provider on this date.  Electronically Signed: Monia Sabal A 07/10/2015, 1:44 PM   I spent a total of 40 Minutes    in face to face in clinical consultation, greater than 50% of which was counseling/coordinating care for percutaneous gastric tube placement

## 2015-07-11 ENCOUNTER — Other Ambulatory Visit (HOSPITAL_COMMUNITY): Payer: Self-pay

## 2015-07-11 LAB — APTT: aPTT: 26 seconds (ref 24–37)

## 2015-07-11 LAB — PROTIME-INR
INR: 1.1 (ref 0.00–1.49)
Prothrombin Time: 14.4 seconds (ref 11.6–15.2)

## 2015-07-11 MED ORDER — VANCOMYCIN HCL IN DEXTROSE 1-5 GM/200ML-% IV SOLN
INTRAVENOUS | Status: AC
  Filled 2015-07-11: qty 200

## 2015-07-11 MED ORDER — IOPAMIDOL (ISOVUE-300) INJECTION 61%
INTRAVENOUS | Status: AC
  Administered 2015-07-11: 10 mL
  Filled 2015-07-11: qty 50

## 2015-07-11 MED ORDER — GLUCAGON HCL RDNA (DIAGNOSTIC) 1 MG IJ SOLR
INTRAMUSCULAR | Status: AC
  Filled 2015-07-11: qty 1

## 2015-07-11 MED ORDER — VANCOMYCIN HCL IN DEXTROSE 1-5 GM/200ML-% IV SOLN
1000.0000 mg | Freq: Once | INTRAVENOUS | Status: AC
  Administered 2015-07-11: 1000 mg via INTRAVENOUS

## 2015-07-11 MED ORDER — MIDAZOLAM HCL 2 MG/2ML IJ SOLN
INTRAMUSCULAR | Status: AC | PRN
  Administered 2015-07-11 (×2): 0.5 mg via INTRAVENOUS

## 2015-07-11 MED ORDER — MIDAZOLAM HCL 2 MG/2ML IJ SOLN
INTRAMUSCULAR | Status: AC
  Filled 2015-07-11: qty 2

## 2015-07-11 MED ORDER — ONDANSETRON HCL 4 MG/2ML IJ SOLN
INTRAMUSCULAR | Status: AC
  Filled 2015-07-11: qty 2

## 2015-07-11 MED ORDER — FENTANYL CITRATE (PF) 100 MCG/2ML IJ SOLN
INTRAMUSCULAR | Status: AC | PRN
  Administered 2015-07-11 (×2): 25 ug via INTRAVENOUS

## 2015-07-11 MED ORDER — GLUCAGON HCL RDNA (DIAGNOSTIC) 1 MG IJ SOLR
1.0000 mg | Freq: Once | INTRAMUSCULAR | Status: AC | PRN
  Administered 2015-07-11: 1 mg via INTRAVENOUS

## 2015-07-11 MED ORDER — FENTANYL CITRATE (PF) 100 MCG/2ML IJ SOLN
INTRAMUSCULAR | Status: AC
Start: 2015-07-11 — End: 2015-07-11
  Filled 2015-07-11: qty 2

## 2015-07-11 MED ORDER — LIDOCAINE HCL 1 % IJ SOLN
INTRAMUSCULAR | Status: AC
  Filled 2015-07-11: qty 20

## 2015-07-11 MED ORDER — ONDANSETRON HCL 4 MG/2ML IJ SOLN
4.0000 mg | Freq: Once | INTRAMUSCULAR | Status: AC
  Administered 2015-07-11: 4 mg via INTRAVENOUS

## 2015-07-11 MED ORDER — SODIUM CHLORIDE 0.9 % IV SOLN
INTRAVENOUS | Status: AC | PRN
  Administered 2015-07-11: 10 mL/h via INTRAVENOUS

## 2015-07-11 NOTE — Sedation Documentation (Signed)
Patient is resting comfortably. 

## 2015-07-11 NOTE — Procedures (Signed)
20 Fr pull through gastrostomy No comp/EBL

## 2015-07-12 LAB — BASIC METABOLIC PANEL
ANION GAP: 6 (ref 5–15)
BUN: 40 mg/dL — AB (ref 6–20)
CALCIUM: 8.5 mg/dL — AB (ref 8.9–10.3)
CO2: 29 mmol/L (ref 22–32)
CREATININE: 0.65 mg/dL (ref 0.44–1.00)
Chloride: 105 mmol/L (ref 101–111)
GFR calc Af Amer: >60 mL/min (ref >60)
GLUCOSE: 61 mg/dL — AB (ref 65–99)
Potassium: 3.7 mmol/L (ref 3.5–5.1)
Sodium: 140 mmol/L (ref 135–145)

## 2015-07-12 LAB — CBC
HEMATOCRIT: 21.8 % — AB (ref 36.0–46.0)
Hemoglobin: 7.2 g/dL — ABNORMAL LOW (ref 12.0–15.0)
MCH: 32.4 pg (ref 26.0–34.0)
MCHC: 33 g/dL (ref 30.0–36.0)
MCV: 98.2 fL (ref 78.0–100.0)
PLATELETS: 220 10*3/uL (ref 150–400)
RBC: 2.22 MIL/uL — AB (ref 3.87–5.11)
RDW: 17 % — ABNORMAL HIGH (ref 11.5–15.5)
WBC: 7.9 10*3/uL (ref 4.0–10.5)

## 2015-07-12 NOTE — Progress Notes (Signed)
Referring Physician(s): Hijazi  Supervising Physician: Marybelle Killings  Patient Status: In-pt  Chief Complaint:  S/P Percutaneous gastrostomy tube placement by Dr. Barbie Banner 07/11/2015  Subjective:  Encephalopathy. Husband at bedside. He reports no complaints.  Allergies: Benadryl; Ciprofloxacin; Furosemide; Nitrofurantoin monohyd macro; Septra; Sulfa drugs cross reactors; and Ceclor  Medications: Prior to Admission medications   Medication Sig Start Date End Date Taking? Authorizing Provider  acetaminophen (TYLENOL) 160 MG/5ML solution Place 20.3 mLs (650 mg total) into feeding tube every 6 (six) hours as needed for mild pain, headache or fever. 06/23/15   Cherene Altes, MD  acetaminophen (TYLENOL) 650 MG suppository Place 1 suppository (650 mg total) rectally every 4 (four) hours as needed for mild pain or fever. 06/23/15   Cherene Altes, MD  antiseptic oral rinse (CPC / CETYLPYRIDINIUM CHLORIDE 0.05%) 0.05 % LIQD solution 7 mLs by Mouth Rinse route 2 times daily at 12 noon and 4 pm. 06/23/15   Cherene Altes, MD  carvedilol (COREG) 25 MG tablet Place 1 tablet (25 mg total) into feeding tube 2 (two) times daily. 06/23/15   Cherene Altes, MD  chlorhexidine (PERIDEX) 0.12 % solution 15 mLs by Mouth Rinse route 2 (two) times daily. 06/23/15   Cherene Altes, MD  diltiazem (CARDIZEM) 10 mg/ml oral suspension Place 6 mLs (60 mg total) into feeding tube every 8 (eight) hours. 06/23/15   Cherene Altes, MD  enoxaparin (LOVENOX) 40 MG/0.4ML injection Inject 0.4 mLs (40 mg total) into the skin daily. 06/23/15   Cherene Altes, MD  furosemide (LASIX) 40 MG tablet Place 1 tablet (40 mg total) into feeding tube 2 (two) times daily. 06/23/15   Cherene Altes, MD  insulin aspart (NOVOLOG) 100 UNIT/ML injection Inject 0-9 Units into the skin every 4 (four) hours. 06/23/15   Cherene Altes, MD  insulin glargine (LANTUS) 100 UNIT/ML injection Inject 0.12 mLs (12 Units total) into the  skin daily. 06/23/15   Cherene Altes, MD  levothyroxine (SYNTHROID, LEVOTHROID) 100 MCG SOLR injection Inject 3.75 mLs (75 mcg total) into the vein daily. 06/23/15   Cherene Altes, MD  Nutritional Supplements (FEEDING SUPPLEMENT, GLUCERNA 1.2 CAL,) LIQD Place 1,000 mLs into feeding tube continuous. 06/23/15   Cherene Altes, MD  ondansetron [facility]) 4 MG/2ML SOLN injection Inject 2 mLs (4 mg total) into the vein every 6 (six) hours as needed for nausea. 06/23/15   Cherene Altes, MD  potassium chloride 20 MEQ/15ML (10%) SOLN Place 30 mLs (40 mEq total) into feeding tube 2 (two) times daily. 06/23/15   Cherene Altes, MD  predniSONE (DELTASONE) 20 MG tablet Place 2 tablets (40 mg total) into feeding tube daily with breakfast. 06/24/15   Cherene Altes, MD  sodium chloride flush (NS) 0.9 % SOLN Inject 3 mLs into the vein every 12 (twelve) hours. 06/23/15   Cherene Altes, MD     Vital Signs: There were no vitals taken for this visit.  Physical Exam  Abdomen soft G-tube site looks good Tube feeds started = no issues.  Imaging: Ct Abdomen Wo Contrast  07/09/2015  CLINICAL DATA:  Dysphagia. Evaluate anatomy prior to potential gastrostomy tube placement EXAM: CT ABDOMEN WITHOUT CONTRAST TECHNIQUE: Multidetector CT imaging of the abdomen was performed following the standard protocol without IV contrast. COMPARISON:  CT abdomen pelvis - 07/17/2009 FINDINGS: Lower chest: Limited visualization of lower thorax demonstrates small to moderate size bilateral effusions with associated compressive atelectasis of the  imaged bilateral lower lobes. Air bronchograms are seen within the bilateral lower lobes and right middle lobe. Several ill-defined nodules are seen within the imaged aerated portion of the right middle lobe with dominant nodule measuring 5 mm in diameter (image 8, series 4). Borderline cardiomegaly. There is diffuse decreased attenuation of the intra cardiac blood pool suggestive of  anemia. Small amount of pericardial fluid, presumably physiologic. Hepatobiliary: Suspected mild nodularity of the hepatic contour. Normal noncontrast appearance of the gallbladder. No radiopaque gallstones. No ascites. Pancreas: Normal noncontrast appearance of the pancreas. Spleen: Normal noncontrast appearance of the spleen Adrenals/Urinary Tract: No renal stones. There is a minimal amount of bilateral perinephric stranding, likely secondary to patient body habitus. No urinary obstruction. Normal noncontrast appearance the bilateral adrenal glands. Stomach/Bowel: Enteric tube tip terminates within the descending portion of the duodenum. While the transverse colon is slightly high riding, there is a small window involving the ventral aspect of the gastric antrum which may allow for percutaneous gastrostomy tube placement (representative axial image 45, series 2, sagittal image 91, series 6). Normal noncontrast appearance of the imaged loops of bowel. No pneumoperitoneum, pneumatosis or portal venous gas. Vascular/Lymphatic: Minimal amount of scattered eccentric calcified plaque within a normal caliber abdominal aorta. Other: Diffuse body wall anasarca. Musculoskeletal: No acute or aggressive osseous abnormalities. There is partial ossification of the T10-T11 intervertebral discs spur. IMPRESSION: 1. Slightly high-riding transverse colon with potential small percutaneous window to allow for gastrostomy tube placement, which may improve with gastric insufflation. Peri-procedural contrast enema could be performed as indicated. 2. Findings suggestive of pulmonary edema with small to moderate size bilateral effusions and diffuse body wall anasarca. 3. Consolidative opacities and air bronchograms within the imaged bilateral lung bases, potentially atelectasis though underlying infection not excluded. 4. Indeterminate pulmonary nodules within the imaged right middle lobe, the largest of which measures 5 mm in diameter.  No follow-up needed if patient is low-risk (and has no known or suspected primary neoplasm). Non-contrast chest CT can be considered in 12 months if patient is high-risk. This recommendation follows the consensus statement: Guidelines for Management of Incidental Pulmonary Nodules Detected on CT Images:From the Fleischner Society 2017; published online before print (10.1148/radiol.IJ:2314499). Electronically Signed   By: Sandi Mariscal M.D.   On: 07/09/2015 15:49   Ir Gastrostomy Tube Mod Sed  07/11/2015  INDICATION: Stroke EXAM: PERC PLACEMENT GASTROSTOMY MEDICATIONS: ; Antibiotics were administered within 1 hour of the procedure. Glucagon 1 mg IV ANESTHESIA/SEDATION: Versed 1 mg IV; Fentanyl 50 mcg IV Moderate Sedation Time:  30 The patient was continuously monitored during the procedure by the interventional radiology nurse under my direct supervision. CONTRAST:  5 cc Omnipaque 300 - administered into the gastric lumen. FLUOROSCOPY TIME:  Fluoroscopy Time: 3 minutes 12 seconds (5 mGy). COMPLICATIONS: None immediate. PROCEDURE: The procedure, risks, benefits, and alternatives were explained to the patient. Questions regarding the procedure were encouraged and answered. The patient understands and consents to the procedure. The epigastrium was prepped with Betadine in a sterile fashion, and a sterile drape was applied covering the operative field. A sterile gown and sterile gloves were used for the procedure. A 5-French orogastric tube is placed under fluoroscopic guidance. Scout imaging of the abdomen confirms barium within the transverse colon. The stomach was distended with gas. Under fluoroscopic guidance, an 18 gauge needle was utilized to puncture the anterior wall of the body of the stomach. An Amplatz wire was advanced through the needle passing a T fastener into the lumen of  the stomach. The T fastener was secured for gastropexy. A 9-French sheath was inserted. A snare was advanced through the 9-French  sheath. A Britta Mccreedy was advanced through the orogastric tube. It was snared then pulled out the oral cavity, pulling the snare, as well. The leading edge of the gastrostomy was attached to the snare. It was then pulled down the esophagus and out the percutaneous site. It was secured in place. Contrast was injected. The image demonstrates placement of a 20-French pull-through type gastrostomy tube into the body of the stomach. IMPRESSION: Successful 20 French pull-through gastrostomy. Electronically Signed   By: Marybelle Killings M.D.   On: 07/11/2015 14:33    Labs:  CBC:  Recent Labs  07/06/15 0520 07/08/15 0630 07/10/15 0630 07/12/15 0700  WBC 6.7 7.6 7.6 7.9  HGB 8.5* 8.4* 8.2* 7.2*  HCT 25.1* 25.1* 23.5* 21.8*  PLT 203 235 228 220    COAGS:  Recent Labs  06/26/15 1518 07/11/15 0640  INR 1.22 1.10  APTT  --  26    BMP:  Recent Labs  07/06/15 0520 07/08/15 0630 07/10/15 0630 07/12/15 0700  NA 140 139 141 140  K 4.3 3.9 4.2 3.7  CL 102 101 104 105  CO2 28 29 30 29   GLUCOSE 130* 100* 116* 61*  BUN 44* 38* 35* 40*  CALCIUM 8.9 8.7* 8.5* 8.5*  CREATININE 0.74 0.79 0.75 0.65  GFRNONAA >60 >60 >60 >60  GFRAA >60 >60 >60 >60    LIVER FUNCTION TESTS:  Recent Labs  06/23/15 0241 06/24/15 0925 06/26/15 0936 06/28/15 0719  BILITOT 0.6 0.6 0.6 0.7  AST 49* 44* 179* 30  ALT 35 38 130* 50  ALKPHOS 74 63 87 56  PROT 6.9 6.8 7.6 6.0*  ALBUMIN 1.7* 1.8* 2.0* 1.5*    Assessment and Plan:  S/P Perc G-tube by Dr. Barbie Banner 07/11/2015 Routine care  Will sign off.  Electronically Signed: Murrell Redden 07/12/2015, 12:21 PM   I spent a total of 15 Minutes at the the patient's bedside AND on the patient's hospital floor or unit, greater than 50% of which was counseling/coordinating care for post op g-tube

## 2015-07-14 LAB — BASIC METABOLIC PANEL
ANION GAP: 7 (ref 5–15)
BUN: 37 mg/dL — ABNORMAL HIGH (ref 6–20)
CALCIUM: 8.6 mg/dL — AB (ref 8.9–10.3)
CO2: 30 mmol/L (ref 22–32)
Chloride: 104 mmol/L (ref 101–111)
Creatinine, Ser: 0.69 mg/dL (ref 0.44–1.00)
Glucose, Bld: 169 mg/dL — ABNORMAL HIGH (ref 65–99)
POTASSIUM: 3.8 mmol/L (ref 3.5–5.1)
SODIUM: 141 mmol/L (ref 135–145)

## 2015-07-14 LAB — CBC
HCT: 19.1 % — ABNORMAL LOW (ref 36.0–46.0)
Hemoglobin: 6.7 g/dL — CL (ref 12.0–15.0)
MCH: 34.2 pg — ABNORMAL HIGH (ref 26.0–34.0)
MCHC: 35.1 g/dL (ref 30.0–36.0)
MCV: 97.4 fL (ref 78.0–100.0)
PLATELETS: 197 10*3/uL (ref 150–400)
RBC: 1.96 MIL/uL — AB (ref 3.87–5.11)
RDW: 16.6 % — AB (ref 11.5–15.5)
WBC: 5.9 10*3/uL (ref 4.0–10.5)

## 2015-07-14 LAB — PREPARE RBC (CROSSMATCH)

## 2015-07-15 ENCOUNTER — Other Ambulatory Visit (HOSPITAL_COMMUNITY): Payer: Self-pay

## 2015-07-15 LAB — CBC
HCT: 23.8 % — ABNORMAL LOW (ref 36.0–46.0)
HEMOGLOBIN: 7.6 g/dL — AB (ref 12.0–15.0)
MCH: 29.5 pg (ref 26.0–34.0)
MCHC: 31.9 g/dL (ref 30.0–36.0)
MCV: 92.2 fL (ref 78.0–100.0)
Platelets: 173 10*3/uL (ref 150–400)
RBC: 2.58 MIL/uL — AB (ref 3.87–5.11)
RDW: 16.9 % — ABNORMAL HIGH (ref 11.5–15.5)
WBC: 5.8 10*3/uL (ref 4.0–10.5)

## 2015-07-15 LAB — TYPE AND SCREEN
ABO/RH(D): B POS
ANTIBODY SCREEN: NEGATIVE
DAT, IGG: NEGATIVE
UNIT DIVISION: 0

## 2015-07-16 ENCOUNTER — Other Ambulatory Visit (HOSPITAL_COMMUNITY): Payer: Self-pay

## 2015-07-16 LAB — IRON AND TIBC
IRON: 32 ug/dL (ref 28–170)
Saturation Ratios: 12 % (ref 10.4–31.8)
TIBC: 260 ug/dL (ref 250–450)
UIBC: 228 ug/dL

## 2015-07-16 LAB — CBC
HCT: 21.1 % — ABNORMAL LOW (ref 36.0–46.0)
HEMOGLOBIN: 7.7 g/dL — AB (ref 12.0–15.0)
MCH: 35.5 pg — ABNORMAL HIGH (ref 26.0–34.0)
MCHC: 36.5 g/dL — AB (ref 30.0–36.0)
MCV: 97.2 fL (ref 78.0–100.0)
Platelets: 186 10*3/uL (ref 150–400)
RBC: 2.17 MIL/uL — ABNORMAL LOW (ref 3.87–5.11)
RDW: 16.5 % — AB (ref 11.5–15.5)
WBC: 5.2 10*3/uL (ref 4.0–10.5)

## 2015-07-16 LAB — FERRITIN: FERRITIN: 165 ng/mL (ref 11–307)

## 2015-07-17 LAB — CBC
HEMATOCRIT: 22.4 % — AB (ref 36.0–46.0)
Hemoglobin: 8.1 g/dL — ABNORMAL LOW (ref 12.0–15.0)
MCH: 35.4 pg — AB (ref 26.0–34.0)
MCHC: 36.2 g/dL — ABNORMAL HIGH (ref 30.0–36.0)
MCV: 97.8 fL (ref 78.0–100.0)
PLATELETS: 185 10*3/uL (ref 150–400)
RBC: 2.29 MIL/uL — ABNORMAL LOW (ref 3.87–5.11)
RDW: 16.5 % — AB (ref 11.5–15.5)
WBC: 4.9 10*3/uL (ref 4.0–10.5)

## 2015-07-19 LAB — BASIC METABOLIC PANEL
Anion gap: 8 (ref 5–15)
BUN: 27 mg/dL — AB (ref 6–20)
CO2: 28 mmol/L (ref 22–32)
CREATININE: 0.66 mg/dL (ref 0.44–1.00)
Calcium: 9 mg/dL (ref 8.9–10.3)
Chloride: 101 mmol/L (ref 101–111)
GFR calc Af Amer: >60 mL/min (ref >60)
GLUCOSE: 116 mg/dL — AB (ref 65–99)
POTASSIUM: 4.2 mmol/L (ref 3.5–5.1)
Sodium: 137 mmol/L (ref 135–145)

## 2015-07-19 LAB — CBC
HCT: 25 % — ABNORMAL LOW (ref 36.0–46.0)
Hemoglobin: 7.9 g/dL — ABNORMAL LOW (ref 12.0–15.0)
MCH: 28.9 pg (ref 26.0–34.0)
MCHC: 31.6 g/dL (ref 30.0–36.0)
MCV: 91.6 fL (ref 78.0–100.0)
PLATELETS: 160 10*3/uL (ref 150–400)
RBC: 2.73 MIL/uL — ABNORMAL LOW (ref 3.87–5.11)
RDW: 16.3 % — AB (ref 11.5–15.5)
WBC: 6.2 10*3/uL (ref 4.0–10.5)

## 2015-08-13 ENCOUNTER — Other Ambulatory Visit (HOSPITAL_COMMUNITY): Payer: Self-pay | Admitting: Internal Medicine

## 2015-08-13 DIAGNOSIS — R1310 Dysphagia, unspecified: Secondary | ICD-10-CM

## 2015-08-15 ENCOUNTER — Ambulatory Visit (HOSPITAL_COMMUNITY)
Admission: RE | Admit: 2015-08-15 | Discharge: 2015-08-15 | Disposition: A | Discharge status: Home / Self Care | Payer: Medicare Other | Source: Ambulatory Visit | Attending: Internal Medicine | Admitting: Internal Medicine

## 2015-08-15 DIAGNOSIS — R1310 Dysphagia, unspecified: Secondary | ICD-10-CM

## 2015-08-15 MED ORDER — LIDOCAINE VISCOUS 2 % MT SOLN
OROMUCOSAL | Status: AC
  Filled 2015-08-15: qty 15

## 2015-08-27 ENCOUNTER — Ambulatory Visit (HOSPITAL_COMMUNITY)
Admission: RE | Admit: 2015-08-27 | Discharge: 2015-08-27 | Disposition: A | Discharge status: Home / Self Care | Payer: No Typology Code available for payment source | Source: Ambulatory Visit | Attending: Internal Medicine | Admitting: Internal Medicine

## 2015-08-27 DIAGNOSIS — Z431 Encounter for attention to gastrostomy: Secondary | ICD-10-CM | POA: Diagnosis not present

## 2015-08-27 DIAGNOSIS — Z8673 Personal history of transient ischemic attack (TIA), and cerebral infarction without residual deficits: Secondary | ICD-10-CM | POA: Diagnosis not present

## 2015-08-27 MED ORDER — LIDOCAINE VISCOUS 2 % MT SOLN
OROMUCOSAL | Status: AC
  Filled 2015-08-27: qty 15

## 2015-08-27 NOTE — Procedures (Signed)
Successful bedside removal of intact pull through G-tube. No immediate post procedural complications.  Jay Abagayle Klutts, MD Pager #: [phone]  

## 2015-09-14 ENCOUNTER — Encounter (HOSPITAL_COMMUNITY): Payer: Self-pay

## 2015-09-14 ENCOUNTER — Inpatient Hospital Stay (HOSPITAL_COMMUNITY)
Admission: EM | Admit: 2015-09-14 | Discharge: 2015-09-19 | DRG: 194 | Disposition: A | Discharge status: Skilled Nursing Facility | Payer: Medicare Other | Attending: Internal Medicine | Admitting: Internal Medicine

## 2015-09-14 ENCOUNTER — Emergency Department (HOSPITAL_COMMUNITY): Payer: Medicare Other

## 2015-09-14 DIAGNOSIS — I10 Essential (primary) hypertension: Secondary | ICD-10-CM | POA: Diagnosis not present

## 2015-09-14 DIAGNOSIS — E871 Hypo-osmolality and hyponatremia: Secondary | ICD-10-CM | POA: Diagnosis present

## 2015-09-14 DIAGNOSIS — E1165 Type 2 diabetes mellitus with hyperglycemia: Secondary | ICD-10-CM | POA: Diagnosis present

## 2015-09-14 DIAGNOSIS — I5032 Chronic diastolic (congestive) heart failure: Secondary | ICD-10-CM | POA: Diagnosis present

## 2015-09-14 DIAGNOSIS — J189 Pneumonia, unspecified organism: Secondary | ICD-10-CM | POA: Diagnosis present

## 2015-09-14 DIAGNOSIS — D631 Anemia in chronic kidney disease: Secondary | ICD-10-CM

## 2015-09-14 DIAGNOSIS — E440 Moderate protein-calorie malnutrition: Secondary | ICD-10-CM | POA: Insufficient documentation

## 2015-09-14 DIAGNOSIS — I776 Arteritis, unspecified: Secondary | ICD-10-CM | POA: Insufficient documentation

## 2015-09-14 DIAGNOSIS — E1169 Type 2 diabetes mellitus with other specified complication: Secondary | ICD-10-CM | POA: Diagnosis not present

## 2015-09-14 DIAGNOSIS — I110 Hypertensive heart disease with heart failure: Secondary | ICD-10-CM | POA: Diagnosis present

## 2015-09-14 DIAGNOSIS — E039 Hypothyroidism, unspecified: Secondary | ICD-10-CM | POA: Diagnosis present

## 2015-09-14 DIAGNOSIS — D62 Acute posthemorrhagic anemia: Secondary | ICD-10-CM | POA: Diagnosis not present

## 2015-09-14 DIAGNOSIS — E8809 Other disorders of plasma-protein metabolism, not elsewhere classified: Secondary | ICD-10-CM | POA: Diagnosis present

## 2015-09-14 DIAGNOSIS — Z9289 Personal history of other medical treatment: Secondary | ICD-10-CM | POA: Insufficient documentation

## 2015-09-14 DIAGNOSIS — Z85820 Personal history of malignant melanoma of skin: Secondary | ICD-10-CM

## 2015-09-14 DIAGNOSIS — E1122 Type 2 diabetes mellitus with diabetic chronic kidney disease: Secondary | ICD-10-CM | POA: Diagnosis not present

## 2015-09-14 DIAGNOSIS — E11649 Type 2 diabetes mellitus with hypoglycemia without coma: Secondary | ICD-10-CM | POA: Diagnosis present

## 2015-09-14 DIAGNOSIS — I480 Paroxysmal atrial fibrillation: Secondary | ICD-10-CM | POA: Diagnosis present

## 2015-09-14 DIAGNOSIS — N390 Urinary tract infection, site not specified: Secondary | ICD-10-CM | POA: Diagnosis present

## 2015-09-14 DIAGNOSIS — Z794 Long term (current) use of insulin: Secondary | ICD-10-CM

## 2015-09-14 DIAGNOSIS — D638 Anemia in other chronic diseases classified elsewhere: Secondary | ICD-10-CM | POA: Diagnosis present

## 2015-09-14 DIAGNOSIS — IMO0002 Reserved for concepts with insufficient information to code with codable children: Secondary | ICD-10-CM | POA: Diagnosis present

## 2015-09-14 DIAGNOSIS — R4182 Altered mental status, unspecified: Secondary | ICD-10-CM | POA: Diagnosis not present

## 2015-09-14 DIAGNOSIS — Y95 Nosocomial condition: Secondary | ICD-10-CM | POA: Diagnosis present

## 2015-09-14 DIAGNOSIS — Z7952 Long term (current) use of systemic steroids: Secondary | ICD-10-CM | POA: Diagnosis not present

## 2015-09-14 DIAGNOSIS — G9340 Encephalopathy, unspecified: Secondary | ICD-10-CM

## 2015-09-14 DIAGNOSIS — M069 Rheumatoid arthritis, unspecified: Secondary | ICD-10-CM | POA: Insufficient documentation

## 2015-09-14 LAB — URINALYSIS, ROUTINE W REFLEX MICROSCOPIC
BILIRUBIN URINE: NEGATIVE
Glucose, UA: NEGATIVE mg/dL
KETONES UR: NEGATIVE mg/dL
NITRITE: NEGATIVE
PROTEIN: 100 mg/dL — AB
SPECIFIC GRAVITY, URINE: 1.014 (ref 1.005–1.030)
pH: 6.5 (ref 5.0–8.0)

## 2015-09-14 LAB — CBC WITH DIFFERENTIAL/PLATELET
BASOS PCT: 0 %
Basophils Absolute: 0 10*3/uL (ref 0.0–0.1)
EOS ABS: 0 10*3/uL (ref 0.0–0.7)
EOS PCT: 0 %
HCT: 21.1 % — ABNORMAL LOW (ref 36.0–46.0)
HEMOGLOBIN: 7.5 g/dL — AB (ref 12.0–15.0)
Lymphocytes Relative: 10 %
Lymphs Abs: 1 10*3/uL (ref 0.7–4.0)
MCH: 32.1 pg (ref 26.0–34.0)
MCHC: 35.5 g/dL (ref 30.0–36.0)
MCV: 90.2 fL (ref 78.0–100.0)
MONOS PCT: 6 %
Monocytes Absolute: 0.6 10*3/uL (ref 0.1–1.0)
NEUTROS PCT: 84 %
Neutro Abs: 8.5 10*3/uL — ABNORMAL HIGH (ref 1.7–7.7)
PLATELETS: 236 10*3/uL (ref 150–400)
RBC: 2.34 MIL/uL — ABNORMAL LOW (ref 3.87–5.11)
RDW: 14.3 % (ref 11.5–15.5)
WBC: 10.3 10*3/uL (ref 4.0–10.5)

## 2015-09-14 LAB — CBG MONITORING, ED
GLUCOSE-CAPILLARY: 257 mg/dL — AB (ref 65–99)
Glucose-Capillary: 206 mg/dL — ABNORMAL HIGH (ref 65–99)

## 2015-09-14 LAB — COMPREHENSIVE METABOLIC PANEL
ALK PHOS: 53 U/L (ref 38–126)
ALT: 16 U/L (ref 14–54)
AST: 18 U/L (ref 15–41)
Albumin: 2.2 g/dL — ABNORMAL LOW (ref 3.5–5.0)
Anion gap: 9 (ref 5–15)
BUN: 23 mg/dL — AB (ref 6–20)
CALCIUM: 8.9 mg/dL (ref 8.9–10.3)
CO2: 21 mmol/L — AB (ref 22–32)
CREATININE: 0.82 mg/dL (ref 0.44–1.00)
Chloride: 95 mmol/L — ABNORMAL LOW (ref 101–111)
Glucose, Bld: 191 mg/dL — ABNORMAL HIGH (ref 65–99)
Potassium: 4.5 mmol/L (ref 3.5–5.1)
SODIUM: 125 mmol/L — AB (ref 135–145)
Total Bilirubin: 0.7 mg/dL (ref 0.3–1.2)
Total Protein: 6 g/dL — ABNORMAL LOW (ref 6.5–8.1)

## 2015-09-14 LAB — PHOSPHORUS: Phosphorus: 2.6 mg/dL (ref 2.5–4.6)

## 2015-09-14 LAB — NA AND K (SODIUM & POTASSIUM), RAND UR
Potassium Urine: 33 mmol/L
Sodium, Ur: 42 mmol/L

## 2015-09-14 LAB — I-STAT CG4 LACTIC ACID, ED
LACTIC ACID, VENOUS: 0.31 mmol/L — AB (ref 0.5–1.9)
Lactic Acid, Venous: 1.26 mmol/L (ref 0.5–1.9)

## 2015-09-14 LAB — MRSA PCR SCREENING: MRSA by PCR: NEGATIVE

## 2015-09-14 LAB — URINE MICROSCOPIC-ADD ON
RBC / HPF: NONE SEEN RBC/hpf (ref 0–5)
Squamous Epithelial / LPF: NONE SEEN

## 2015-09-14 LAB — BRAIN NATRIURETIC PEPTIDE: B NATRIURETIC PEPTIDE 5: 340 pg/mL — AB (ref 0.0–100.0)

## 2015-09-14 LAB — STREP PNEUMONIAE URINARY ANTIGEN: STREP PNEUMO URINARY ANTIGEN: POSITIVE — AB

## 2015-09-14 LAB — GLUCOSE, CAPILLARY: Glucose-Capillary: 253 mg/dL — ABNORMAL HIGH (ref 65–99)

## 2015-09-14 LAB — MAGNESIUM: MAGNESIUM: 1.7 mg/dL (ref 1.7–2.4)

## 2015-09-14 MED ORDER — VANCOMYCIN HCL IN DEXTROSE 1-5 GM/200ML-% IV SOLN
1000.0000 mg | Freq: Once | INTRAVENOUS | Status: AC
  Administered 2015-09-14: 1000 mg via INTRAVENOUS
  Filled 2015-09-14: qty 200

## 2015-09-14 MED ORDER — CARVEDILOL 25 MG PO TABS: 25.0000 mg | ORAL_TABLET | Freq: Two times a day (BID) | ORAL | Status: DC

## 2015-09-14 MED ORDER — LEVOTHYROXINE SODIUM 75 MCG PO TABS: 150.0000 ug | ORAL_TABLET | Freq: Every day | ORAL | Status: DC

## 2015-09-14 MED ORDER — PIPERACILLIN-TAZOBACTAM 3.375 G IVPB
3.3750 g | Freq: Three times a day (TID) | INTRAVENOUS | Status: DC
  Administered 2015-09-15 – 2015-09-17 (×8): 3.375 g via INTRAVENOUS
  Filled 2015-09-14 (×9): qty 50

## 2015-09-14 MED ORDER — ONDANSETRON HCL 4 MG PO TABS: 4.0000 mg | ORAL_TABLET | Freq: Four times a day (QID) | ORAL | Status: DC | PRN

## 2015-09-14 MED ORDER — FLUCONAZOLE 150 MG PO TABS
150.0000 mg | ORAL_TABLET | Freq: Every day | ORAL | Status: AC
  Administered 2015-09-15 – 2015-09-16 (×2): 150 mg via ORAL
  Filled 2015-09-14 (×2): qty 1

## 2015-09-14 MED ORDER — ALBUTEROL SULFATE (2.5 MG/3ML) 0.083% IN NEBU: 2.5000 mg | INHALATION_SOLUTION | Freq: Four times a day (QID) | RESPIRATORY_TRACT | Status: DC | PRN

## 2015-09-14 MED ORDER — IPRATROPIUM BROMIDE 0.02 % IN SOLN: 0.5000 mg | Freq: Four times a day (QID) | RESPIRATORY_TRACT | Status: DC | PRN

## 2015-09-14 MED ORDER — FERROUS SULFATE 325 (65 FE) MG PO TABS
325.0000 mg | ORAL_TABLET | Freq: Two times a day (BID) | ORAL | Status: DC
  Administered 2015-09-15 – 2015-09-19 (×9): 325 mg via ORAL
  Filled 2015-09-14 (×9): qty 1

## 2015-09-14 MED ORDER — CARVEDILOL 25 MG PO TABS
25.0000 mg | ORAL_TABLET | Freq: Two times a day (BID) | ORAL | Status: DC
  Administered 2015-09-15 – 2015-09-19 (×9): 25 mg via ORAL
  Filled 2015-09-14 (×9): qty 1

## 2015-09-14 MED ORDER — ONDANSETRON HCL 4 MG/2ML IJ SOLN: 4.0000 mg | Freq: Four times a day (QID) | INTRAMUSCULAR | Status: DC | PRN

## 2015-09-14 MED ORDER — SODIUM CHLORIDE 0.9 % IV SOLN
INTRAVENOUS | Status: DC
Start: 2015-09-14 — End: 2015-09-17
  Administered 2015-09-14 – 2015-09-15 (×2): via INTRAVENOUS

## 2015-09-14 MED ORDER — INSULIN ASPART 100 UNIT/ML ~~LOC~~ SOLN
0.0000 [IU] | Freq: Three times a day (TID) | SUBCUTANEOUS | Status: DC
  Administered 2015-09-16 (×2): 1 [IU] via SUBCUTANEOUS
  Administered 2015-09-17: 2 [IU] via SUBCUTANEOUS
  Administered 2015-09-17: 1 [IU] via SUBCUTANEOUS
  Administered 2015-09-18: 2 [IU] via SUBCUTANEOUS
  Administered 2015-09-19: 1 [IU] via SUBCUTANEOUS
  Administered 2015-09-19: 2 [IU] via SUBCUTANEOUS

## 2015-09-14 MED ORDER — ENOXAPARIN SODIUM 40 MG/0.4ML ~~LOC~~ SOLN
40.0000 mg | Freq: Every day | SUBCUTANEOUS | Status: DC
  Administered 2015-09-14 – 2015-09-18 (×6): 40 mg via SUBCUTANEOUS
  Filled 2015-09-14 (×5): qty 0.4

## 2015-09-14 MED ORDER — PIPERACILLIN-TAZOBACTAM 3.375 G IVPB: 3.3750 g | Freq: Three times a day (TID) | INTRAVENOUS | Status: DC

## 2015-09-14 MED ORDER — GUAIFENESIN ER 600 MG PO TB12
1200.0000 mg | ORAL_TABLET | Freq: Two times a day (BID) | ORAL | Status: AC
  Administered 2015-09-14 – 2015-09-18 (×9): 1200 mg via ORAL
  Filled 2015-09-14 (×9): qty 2

## 2015-09-14 MED ORDER — FERROUS SULFATE 325 (65 FE) MG PO TABS: 325.0000 mg | ORAL_TABLET | Freq: Two times a day (BID) | ORAL | Status: DC

## 2015-09-14 MED ORDER — INSULIN ASPART 100 UNIT/ML ~~LOC~~ SOLN: 0.0000 [IU] | Freq: Three times a day (TID) | SUBCUTANEOUS | Status: DC

## 2015-09-14 MED ORDER — TRAMADOL HCL 50 MG PO TABS: 50.0000 mg | ORAL_TABLET | Freq: Four times a day (QID) | ORAL | Status: DC | PRN

## 2015-09-14 MED ORDER — SODIUM CHLORIDE 0.9% FLUSH
3.0000 mL | Freq: Two times a day (BID) | INTRAVENOUS | Status: DC
  Administered 2015-09-14 – 2015-09-18 (×8): 3 mL via INTRAVENOUS

## 2015-09-14 MED ORDER — INSULIN GLARGINE 100 UNIT/ML ~~LOC~~ SOLN
15.0000 [IU] | Freq: Two times a day (BID) | SUBCUTANEOUS | Status: DC
  Administered 2015-09-14 – 2015-09-15 (×3): 15 [IU] via SUBCUTANEOUS
  Filled 2015-09-14 (×6): qty 0.15

## 2015-09-14 MED ORDER — PIPERACILLIN-TAZOBACTAM 3.375 G IVPB 30 MIN
3.3750 g | Freq: Once | INTRAVENOUS | Status: AC
  Administered 2015-09-14: 3.375 g via INTRAVENOUS
  Filled 2015-09-14: qty 50

## 2015-09-14 MED ORDER — LEVOTHYROXINE SODIUM 75 MCG PO TABS
150.0000 ug | ORAL_TABLET | Freq: Every day | ORAL | Status: DC
  Administered 2015-09-15 – 2015-09-19 (×5): 150 ug via ORAL
  Filled 2015-09-14 (×5): qty 2

## 2015-09-14 MED ORDER — VANCOMYCIN HCL 500 MG IV SOLR
500.0000 mg | Freq: Two times a day (BID) | INTRAVENOUS | Status: DC
  Administered 2015-09-15 – 2015-09-16 (×3): 500 mg via INTRAVENOUS
  Filled 2015-09-14 (×4): qty 500

## 2015-09-14 NOTE — H&P (Signed)
History and Physical    Emily Kennedy [id number] DOB: 09/15/1945 DOA: 09/14/2015  PCP: [name], MD   Patient coming from: [facility]  Chief Complaint: Altered mental status.  HPI: Emily Kennedy is a 70 y.o. female with medical history significant of type 2 diabetes, vasculitis, hypertension, hypothyroidism, chronic diastolic CHF, PAF who was admitted 3 months ago for altered mental status due to streptococcal and Legionella pneumonia. She is being brought today to the emergency department via EMS  from [facility] due to altered mental status and fever. The patient's daughter stated that she was not acting right and has been feeling sick for a while.  She complains of fever, chills, headache, nausea, decreased appetite, pleuritic chest pain and productive cough. She denies abdominal pain, vomiting, diarrhea, constipation, melena or hematochezia. She denies dysuria, frequency or hematuria.  ED Course: Patient received supplemental oxygen, bronchodilators, IV fluids and IV antibiotics. She reports feeling a lot better. Workup shows urinalysis with pyuria and bacteriuria, worsening anemia, elevated WBC from baseline, hyponatremia and continue infiltrate and checks his right.   Review of Systems: As per HPI otherwise 10 point review of systems negative.  Past Medical History  Diagnosis Date  . Diabetes mellitus   . Vasculitis ([place])   . Hypertension   . Thyroid disease     Past Surgical History  Procedure Laterality Date  . Tubal ligation    . Melanoma excision       reports that she has never smoked. She has never used smokeless tobacco. She reports that she does not drink alcohol or use illicit drugs.  Allergies  Allergen Reactions  . Benadryl [Diphenhydramine] Swelling  . Ciprofloxacin Hives  . Furosemide Itching, Swelling, Rash and Other (See Comments)    Made edema worse  . Nitrofurantoin Monohyd Macro Hives  . Septra  [Bactrim] Hives  . Sulfa Drugs Cross Reactors Hives  . Ceclor [Cefaclor] Rash    Breaks out in rash chest down.    Family History  Problem Relation Age of Onset  . Cancer Sister   . Diabetes Mellitus II Mother   . Tuberculosis Father      Prior to Admission medications   Medication Sig Start Date End Date Taking? Authorizing Provider  carvedilol (COREG) 25 MG tablet Place 1 tablet (25 mg total) into feeding tube 2 (two) times daily. Patient taking differently: Take 25 mg by mouth 2 (two) times daily.  06/23/15  Yes Cherene Altes, MD  diltiazem (CARDIZEM) 90 MG tablet Take 90 mg by mouth every 6 (six) hours. Midnight, 6am, noon, 6pm   Yes Historical Provider, MD  doxycycline (VIBRA-TABS) 100 MG tablet Take 100 mg by mouth every 12 (twelve) hours. 7 day course started 09/14/15   Yes Historical Provider, MD  ferrous sulfate 325 (65 FE) MG tablet Take 325 mg by mouth 2 (two) times daily with a meal.   Yes Historical Provider, MD  fluconazole (DIFLUCAN) 150 MG tablet Take 150 mg by mouth daily. 3 day course started 09/14/15   Yes Historical Provider, MD  GLUCERNA ([place]) LIQD Take 237 mLs by mouth 2 (two) times daily.   Yes Historical Provider, MD  guaiFENesin (MUCINEX) 600 MG 12 hr tablet Take 1,200 mg by mouth 2 (two) times daily. 5 day course started 09/14/15   Yes Historical Provider, MD  hydrALAZINE (APRESOLINE) 25 MG tablet Take 25 mg by mouth every 6 (six) hours. Midnight, 6am, noon, 6pm   Yes Historical Provider,  MD  insulin aspart (NOVOLOG) 100 UNIT/ML injection Inject 0-9 Units into the skin every 4 (four) hours. Patient taking differently: Inject 0-10 Units into the skin 4 (four) times daily. Inject 0-10 units subcutaneously at 6:30am, 11am, 4pm, 9pm per sliding scale: CBG 0-200 0 units, 201-250 2 units, 251-300 4 units, 301-350 6 units, 351-400 8 units, >400 10 units and call MD 06/23/15  Yes Cherene Altes, MD  insulin glargine (LANTUS) 100 UNIT/ML injection Inject 0.12 mLs (12  Units total) into the skin daily. Patient taking differently: Inject 15 Units into the skin 2 (two) times daily. 9am, 6pm 06/23/15  Yes Cherene Altes, MD  levothyroxine (SYNTHROID, LEVOTHROID) 150 MCG tablet Take 150 mcg by mouth daily before breakfast.   Yes Historical Provider, MD  lisinopril (PRINIVIL,ZESTRIL) 30 MG tablet Take 30 mg by mouth daily.   Yes Historical Provider, MD  Multiple Vitamin (MULTIVITAMIN WITH MINERALS) TABS tablet Take 1 tablet by mouth daily.   Yes Historical Provider, MD  Omega-3 Fatty Acids (FISH OIL) 1000 MG CAPS Take 4,000 mg by mouth daily.   Yes Historical Provider, MD  OVER THE COUNTER MEDICATION Apply 1 application topically 3 (three) times daily. Antifungal cream - applied to vaginal and sacral area with brief changes every shift (3 times daily)   Yes Historical Provider, MD  predniSONE (DELTASONE) 10 MG tablet Take 10 mg by mouth daily with breakfast.   Yes Historical Provider, MD  torsemide (DEMADEX) 10 MG tablet Take 10 mg by mouth daily.   Yes Historical Provider, MD  traMADol (ULTRAM) 50 MG tablet Take 50 mg by mouth every 6 (six) hours as needed (pain).   Yes Historical Provider, MD  acetaminophen (TYLENOL) 160 MG/5ML solution Place 20.3 mLs (650 mg total) into feeding tube every 6 (six) hours as needed for mild pain, headache or fever. Patient not taking: Reported on 09/14/2015 06/23/15   Cherene Altes, MD  acetaminophen (TYLENOL) 650 MG suppository Place 1 suppository (650 mg total) rectally every 4 (four) hours as needed for mild pain or fever. Patient not taking: Reported on 09/14/2015 06/23/15   Cherene Altes, MD  antiseptic oral rinse (CPC / CETYLPYRIDINIUM CHLORIDE 0.05%) 0.05 % LIQD solution 7 mLs by Mouth Rinse route 2 times daily at 12 noon and 4 pm. Patient not taking: Reported on 09/14/2015 06/23/15   Cherene Altes, MD  chlorhexidine (PERIDEX) 0.12 % solution 15 mLs by Mouth Rinse route 2 (two) times daily. Patient not taking: Reported on  09/14/2015 06/23/15   Cherene Altes, MD  diltiazem (CARDIZEM) 10 mg/ml oral suspension Place 6 mLs (60 mg total) into feeding tube every 8 (eight) hours. Patient not taking: Reported on 09/14/2015 06/23/15   Cherene Altes, MD  furosemide (LASIX) 40 MG tablet Place 1 tablet (40 mg total) into feeding tube 2 (two) times daily. Patient not taking: Reported on 09/14/2015 06/23/15   Cherene Altes, MD  levothyroxine (SYNTHROID, LEVOTHROID) 100 MCG SOLR injection Inject 3.75 mLs (75 mcg total) into the vein daily. Patient not taking: Reported on 09/14/2015 06/23/15   Cherene Altes, MD  Nutritional Supplements (FEEDING SUPPLEMENT, GLUCERNA 1.2 CAL,) LIQD Place 1,000 mLs into feeding tube continuous. Patient not taking: Reported on 09/14/2015 06/23/15   Cherene Altes, MD  ondansetron [facility]) 4 MG/2ML SOLN injection Inject 2 mLs (4 mg total) into the vein every 6 (six) hours as needed for nausea. Patient not taking: Reported on 09/14/2015 06/23/15   Cherene Altes, MD  potassium chloride 20 MEQ/15ML (10%) SOLN Place 30 mLs (40 mEq total) into feeding tube 2 (two) times daily. Patient not taking: Reported on 09/14/2015 06/23/15   Cherene Altes, MD  predniSONE (DELTASONE) 20 MG tablet Place 2 tablets (40 mg total) into feeding tube daily with breakfast. Patient not taking: Reported on 09/14/2015 06/24/15   Cherene Altes, MD  sodium chloride flush (NS) 0.9 % SOLN Inject 3 mLs into the vein every 12 (twelve) hours. Patient not taking: Reported on 09/14/2015 06/23/15   Cherene Altes, MD    Physical Exam: Filed Vitals:   09/14/15 1909 09/14/15 2000 09/14/15 2100 09/14/15 2122  BP:  121/55  117/52  Pulse:  66  67  Temp: 98.2 F (36.8 C)  98.1 F (36.7 C)   TempSrc: Oral  Oral   Resp:  18  18  Height:    5\' 4"  (1.626 m)  Weight:    61.5 kg (135 lb 9.3 oz)  SpO2:  93%  93%      Constitutional: NAD, calm, comfortable Filed Vitals:   09/14/15 1909 09/14/15 2000 09/14/15 2100 09/14/15 2122    BP:  121/55  117/52  Pulse:  66  67  Temp: 98.2 F (36.8 C)  98.1 F (36.7 C)   TempSrc: Oral  Oral   Resp:  18  18  Height:    5\' 4"  (1.626 m)  Weight:    61.5 kg (135 lb 9.3 oz)  SpO2:  93%  93%   Eyes: Left eye coloboma. ENMT: Mucous membranes are moist. Posterior pharynx clear of any exudate or lesions. Dentures.  Neck: normal, supple, no masses, no thyromegaly Respiratory:  Decreased breath sounds and crackles at bases. No wheezing.                        No accessory muscle use.  Cardiovascular: Regular rate and rhythm, no murmurs / rubs / gallops. No extremity edema. 2+ pedal pulses. No carotid bruits.  Abdomen: no tenderness, no masses palpated. No hepatosplenomegaly. Bowel sounds positive.  Musculoskeletal: no clubbing / cyanosis. No joint deformity upper and lower extremities. Good ROM, no contractures. Normal muscle tone.  Skin: Multiple ecchymotic areas on upper extremities, skin dryness particularly in lower extremities.          Onychomycosis of toenails. Neurologic: CN 2-12 grossly intact. Sensation intact, DTR normal. Strength 5/5 in all 4.  Psychiatric:  Alert and oriented x 3. Normal mood.    Labs on Admission: I have personally reviewed following labs and imaging studies  CBC:  Recent Labs Lab 09/14/15 1644  WBC 10.3  NEUTROABS 8.5*  HGB 7.5*  HCT 21.1*  MCV 90.2  PLT [id number]   Basic Metabolic Panel:  Recent Labs Lab 09/14/15 1644  NA 125*  K 4.5  CL 95*  CO2 21*  GLUCOSE 191*  BUN 23*  CREATININE 0.82  CALCIUM 8.9   GFR: Estimated Creatinine Clearance: 56.7 mL/min (by C-G formula based on Cr of 0.82). Liver Function Tests:  Recent Labs Lab 09/14/15 1644  AST 18  ALT 16  ALKPHOS 53  BILITOT 0.7  PROT 6.0*  ALBUMIN 2.2*   CBG:  Recent Labs Lab 09/14/15 1655 09/14/15 2007  GLUCAP 206* 257*   Urine analysis:    Component Value Date/Time   COLORURINE YELLOW 09/14/2015 1416   APPEARANCEUR TURBID* 09/14/2015 1416   LABSPEC  1.014 09/14/2015 1416   PHURINE 6.5 09/14/2015 1416   GLUCOSEU NEGATIVE  09/14/2015 1416   HGBUR LARGE* 09/14/2015 1416   [place] 09/14/2015 1416   KETONESUR NEGATIVE 09/14/2015 1416   PROTEINUR 100* 09/14/2015 1416   UROBILINOGEN 1.0 01/06/2011 2252   NITRITE NEGATIVE 09/14/2015 1416   LEUKOCYTESUR LARGE* 09/14/2015 1416     Radiological Exams on Admission: Dg Chest 2 View  09/14/2015  CLINICAL DATA:  Evaluate for pneumonia. Slight shortness of breath and right-sided chest pain with fever and productive cough. EXAM: CHEST  2 VIEW COMPARISON:  07/15/2015 FINDINGS: Lungs are adequately inflated demonstrate improved left base opacification likely improved effusion/atelectasis. Worsening patchy airspace process over the right upper lobe with stable right base opacification. Findings likely due to a pneumonia. Small right pleural effusion. Cardiomediastinal silhouette and remainder of the exam is unchanged. IMPRESSION: Interval improvement left base opacification with stable right base opacification. New airspace process over the right upper lobe likely pneumonia. Small right effusion. Electronically Signed   By: Marin Olp M.D.   On: 09/14/2015 17:18  Echocardiogram 05/19/2015. ------------------------------------------------------------------- LV EF: 60% - 65%  ------------------------------------------------------------------- Indications: Edema 782.3.  ------------------------------------------------------------------- History: PMH: Bilateral lower extremity edema. Hyponatremia. MGUS. Vasculits. Nausea and vomiting. Type 2 diabetes. Anemia. Hypothyroidism. Essential hypertension.  ------------------------------------------------------------------- Study Conclusions  - Left ventricle: The cavity size was normal. Wall thickness was  increased in a pattern of mild LVH. Systolic function was normal.  The estimated ejection fraction was in the range of 60% to  65%.  Features are consistent with a pseudonormal left ventricular  filling pattern, with concomitant abnormal relaxation and  increased filling pressure (grade 2 diastolic dysfunction). - Left atrium: The atrium was moderately dilated. - Pericardium, extracardiac: A trivial pericardial effusion was  identified.  EKG: Independently reviewed. Vent. rate 70 BPM PR interval * ms QRS duration 96 ms QT/QTc 421/455 ms P-R-T axes 81 69 87 Sinus rhythm Abnrm T, consider ischemia, anterolateral lds No acute changes Nonspecific ST and T wave abnormality  Assessment/Plan Principal Problem:   HCAP (healthcare-associated pneumonia) Admit to the stepdown unit/inpatient. Continue supplemental oxygen. Continue bronchodilators as needed. Continue vancomycin and Zosyn per pharmacy. Follow-up blood cultures and sensitivity. Check a sputum Gram stain, culture and sensitivity. Check Legionella and strep pneumoniae urine antigens.  Active Problems:   Hyponatremia She received 1 L of normal saline in the emergency department. Continue gentle IV hydration. Check urine sodium. The patient has a history of previous Legionella infection. Follow-up Legionella urinary antigen. Expand workup and/or consult nephrology if no improvement.    Diabetes mellitus type 2, uncontrolled (HCC) Carbohydrate modified diet. Continue Lantus 15 units SQ twice a day. CBG monitoring with regular insulin sliding scale.    Anemia, chronic disease Monitor H&H. Follow-up hematocrit and hemoglobin. Transfuse if needed.    Hypothyroidism Continue levothyroxine 150 g by mouth daily    Essential hypertension Will hold most antihypertensive agents, except for carvedilol 25 mg by mouth twice a day. Monitor blood pressure closely and resume other antihypertensive therapy as measurements allow.   DVT prophylaxis: Lovenox. Code Status: Full code. Family Communication: Her daughter was present in the  room. Disposition Plan: Admit for IV antibiotic therapy for several days. Consults called:  Admission status: Inpatient/stepdown   Reubin Milan MD [facility] Pager [phone].  If 7PM-7AM, please contact night-coverage [email or web address] Password [facility]  09/14/2015, 9:49 PM

## 2015-09-14 NOTE — Plan of Care (Signed)
Problem: Education: Goal: Knowledge of disease or condition will improve Outcome: Progressing Discussed plan of care and cough sputum up and out; deep breathing with teachback

## 2015-09-14 NOTE — ED Notes (Signed)
Daughter at bedside, reports that pt is due to leave facility tomorrow and she does not think that pt is ready to go home.  Pt lives with daughter, daughter works outside of home. Daughter wanted pt to be re-assessed.

## 2015-09-14 NOTE — ED Notes (Signed)
Report attempted 

## 2015-09-14 NOTE — ED Provider Notes (Signed)
CSN: [id number]     Arrival date & time 09/14/15  1613 History   First MD Initiated Contact with Patient 09/14/15 1613     No chief complaint on file.    (Consider location/radiation/quality/duration/timing/severity/associated sxs/prior Treatment) Patient is a 70 y.o. female presenting with altered mental status. The history is provided by the EMS personnel and the patient.  Altered Mental Status Presenting symptoms: behavior changes, confusion and disorientation   Severity:  Moderate Most recent episode:  Today Timing:  Constant Progression:  Worsening Context: recent infection   Associated symptoms: difficulty breathing, fever and weakness   Associated symptoms: no agitation, no nausea and no vomiting   Associated symptoms comment:  Cough   Past Medical History  Diagnosis Date  . Diabetes mellitus   . Vasculitis ([place])   . Hypertension   . Thyroid disease    Past Surgical History  Procedure Laterality Date  . Tubal ligation    . Melanoma excision     Family History  Problem Relation Age of Onset  . Cancer Sister    Social History  Substance Use Topics  . Smoking status: Never Smoker   . Smokeless tobacco: Never Used  . Alcohol Use: No   OB History    No data available     Review of Systems  Constitutional: Positive for fever. Negative for chills.  HENT: Negative for drooling.   Respiratory: Positive for cough. Negative for chest tightness.   Cardiovascular: Positive for chest pain (R sided). Negative for leg swelling.  Gastrointestinal: Negative for nausea, vomiting and abdominal distention.  Genitourinary: Positive for dysuria.  Neurological: Positive for weakness. Negative for speech difficulty.  Psychiatric/Behavioral: Positive for confusion. Negative for agitation.      Allergies  Benadryl; Ciprofloxacin; Furosemide; Nitrofurantoin monohyd macro; Septra; Sulfa drugs cross reactors; and Ceclor  Home Medications   Prior to Admission medications    Medication Sig Start Date End Date Taking? Authorizing Provider  acetaminophen (TYLENOL) 160 MG/5ML solution Place 20.3 mLs (650 mg total) into feeding tube every 6 (six) hours as needed for mild pain, headache or fever. 06/23/15   Cherene Altes, MD  acetaminophen (TYLENOL) 650 MG suppository Place 1 suppository (650 mg total) rectally every 4 (four) hours as needed for mild pain or fever. 06/23/15   Cherene Altes, MD  antiseptic oral rinse (CPC / CETYLPYRIDINIUM CHLORIDE 0.05%) 0.05 % LIQD solution 7 mLs by Mouth Rinse route 2 times daily at 12 noon and 4 pm. 06/23/15   Cherene Altes, MD  carvedilol (COREG) 25 MG tablet Place 1 tablet (25 mg total) into feeding tube 2 (two) times daily. 06/23/15   Cherene Altes, MD  chlorhexidine (PERIDEX) 0.12 % solution 15 mLs by Mouth Rinse route 2 (two) times daily. 06/23/15   Cherene Altes, MD  diltiazem (CARDIZEM) 10 mg/ml oral suspension Place 6 mLs (60 mg total) into feeding tube every 8 (eight) hours. 06/23/15   Cherene Altes, MD  enoxaparin (LOVENOX) 40 MG/0.4ML injection Inject 0.4 mLs (40 mg total) into the skin daily. 06/23/15   Cherene Altes, MD  furosemide (LASIX) 40 MG tablet Place 1 tablet (40 mg total) into feeding tube 2 (two) times daily. 06/23/15   Cherene Altes, MD  insulin aspart (NOVOLOG) 100 UNIT/ML injection Inject 0-9 Units into the skin every 4 (four) hours. 06/23/15   Cherene Altes, MD  insulin glargine (LANTUS) 100 UNIT/ML injection Inject 0.12 mLs (12 Units total) into the skin daily. 06/23/15  Cherene Altes, MD  levothyroxine (SYNTHROID, LEVOTHROID) 100 MCG SOLR injection Inject 3.75 mLs (75 mcg total) into the vein daily. 06/23/15   Cherene Altes, MD  Nutritional Supplements (FEEDING SUPPLEMENT, GLUCERNA 1.2 CAL,) LIQD Place 1,000 mLs into feeding tube continuous. 06/23/15   Cherene Altes, MD  ondansetron [facility]) 4 MG/2ML SOLN injection Inject 2 mLs (4 mg total) into the vein every 6 (six) hours  as needed for nausea. 06/23/15   Cherene Altes, MD  potassium chloride 20 MEQ/15ML (10%) SOLN Place 30 mLs (40 mEq total) into feeding tube 2 (two) times daily. 06/23/15   Cherene Altes, MD  predniSONE (DELTASONE) 20 MG tablet Place 2 tablets (40 mg total) into feeding tube daily with breakfast. 06/24/15   Cherene Altes, MD  sodium chloride flush (NS) 0.9 % SOLN Inject 3 mLs into the vein every 12 (twelve) hours. 06/23/15   Cherene Altes, MD   There were no vitals taken for this visit. Physical Exam  Constitutional: She appears well-developed and well-nourished. No distress.  HENT:  Head: Normocephalic and atraumatic.  Eyes: Conjunctivae are normal.  Neck: Normal range of motion. No tracheal deviation present.  Cardiovascular: Normal rate and normal heart sounds.   Pulmonary/Chest: [place]ort normal. She has no wheezes. She has no rales.  Abdominal: She exhibits no distension. There is no tenderness.  Musculoskeletal: She exhibits edema (1+).  Neurological: She is alert.  Disoriented to day and reason for visit  Skin: Skin is warm. No rash noted. She is not diaphoretic. No erythema.    ED Course  Procedures (including critical care time) Labs Review Labs Reviewed - No data to display  Imaging Review No results found. I have personally reviewed and evaluated these images and lab results as part of my medical decision-making.   EKG Interpretation None      MDM   Final diagnoses:  None   Emily Kennedy is 70 y.o. female currently living at [facility] where she was started on doxycycline this AM for pneumonia by chest xray presenting for altered mental status and fever. Fever to 102 here. CXR shows right side opacity concerning for pneumonia. She is at the SNF due to recent illness this spring resulting in 1 round of CPR and intubation with subsequent difficulty swallowing and placement of PEG tube. She is no longer using the PEG for food intake.    Laboratory evaluation shows normal white count, anemia with Hgb to 7.5, hyponatremia, hypoalbuminemia, BNP 340.   She was started on vanc/zosyn for HCAP and admitted to medicine for further workup and care.   Her mental status improved from disoriented to place and circumstance to oriented to both place and circumstance after her daughter arrived.   Patient seen with Dr. Kathrynn Humble who agreed with the treatment plan.        Allie Bossier, MD 09/15/15 [id number]  Varney Biles, MD 09/21/15 [phone]

## 2015-09-14 NOTE — ED Notes (Addendum)
To room via EMS from [facility].  Family wanted pt sent out d/t pt "wasn't acting right".  Pt reporting she "has been sick for a while".  Alert to person.  Pt was started Doxycyline this morning.  EMS gave Tylenol 1000 mg for Temp 102, forehead and Zofran 4 mg for nausea.

## 2015-09-14 NOTE — Progress Notes (Signed)
ANTIBIOTIC CONSULT NOTE - INITIAL  Pharmacy Consult for Vancomycin Indication: pneumonia  Allergies  Allergen Reactions  . Benadryl [Diphenhydramine] Swelling  . Ciprofloxacin Hives  . Furosemide Itching, Swelling, Rash and Other (See Comments)    Made edema worse  . Nitrofurantoin Monohyd Macro Hives  . Septra [Bactrim] Hives  . Sulfa Drugs Cross Reactors Hives  . Ceclor [Cefaclor] Rash    Breaks out in rash chest down.    Patient Measurements:   Adjusted Body Weight:    Vital Signs: Temp: 101.8 F (38.8 C) (07/02 1646) Temp Source: Rectal (07/02 1646) BP: 135/57 mmHg (07/02 1634) Pulse Rate: 72 (07/02 1634) Intake/Output from previous day:   Intake/Output from this shift:    Labs:  Recent Labs  09/14/15 1644  WBC 10.3  HGB 7.5*  PLT 236   CrCl cannot be calculated (Unknown ideal weight.). No results for input(s): VANCOTROUGH, VANCOPEAK, VANCORANDOM, GENTTROUGH, GENTPEAK, GENTRANDOM, TOBRATROUGH, TOBRAPEAK, TOBRARND, AMIKACINPEAK, AMIKACINTROU, AMIKACIN in the last 72 hours.   Microbiology: No results found for this or any previous visit (from the past 720 hour(s)).  Medical History: Past Medical History  Diagnosis Date  . Diabetes mellitus   . Vasculitis ([place])   . Hypertension   . Thyroid disease     Medications:  F/u med rec  Assessment: 70 y/o F presents from [facility] with family c/o behavior changes, confusion and disorientation, fever, SOB, and weakness. Temp 102. Patient was started on Doxycycline today 7/2. CXR + for PNA  Admit labs: Na 125 low. Scr 0.82 (CrCl 51), Albumin 2.2. WBC 10.3, LA 1.26, Hgb 7.5  ID: Start Abx for PNA. Tc 101.8. Doxy 7/2 at [facility] Vanco 7/2>>  Goal of Therapy:  Vancomycin trough level 15-20 mcg/ml  Plan:  Zosyn 3.375g IV q8hr. Vancomycin 1g IV x 1 in ED then 500mg  IV q12h Vancomycin trough after 3-5 doses at steady state.  Tzivia Oneil S. Alford Highland, PharmD, BCPS Clinical Staff Pharmacist Pager  [phone]  Eilene Ghazi Stillinger 09/14/2015,5:38 PM

## 2015-09-15 DIAGNOSIS — E871 Hypo-osmolality and hyponatremia: Secondary | ICD-10-CM | POA: Diagnosis present

## 2015-09-15 LAB — COMPREHENSIVE METABOLIC PANEL
ALBUMIN: 2 g/dL — AB (ref 3.5–5.0)
ALK PHOS: 53 U/L (ref 38–126)
ALT: 16 U/L (ref 14–54)
AST: 17 U/L (ref 15–41)
Anion gap: 17 — ABNORMAL HIGH (ref 5–15)
BILIRUBIN TOTAL: 0.6 mg/dL (ref 0.3–1.2)
BUN: 22 mg/dL — AB (ref 6–20)
CO2: 23 mmol/L (ref 22–32)
Calcium: 9.7 mg/dL (ref 8.9–10.3)
Chloride: 92 mmol/L — ABNORMAL LOW (ref 101–111)
Creatinine, Ser: 0.81 mg/dL (ref 0.44–1.00)
GFR calc Af Amer: >60 mL/min (ref >60)
GFR calc non Af Amer: >60 mL/min (ref >60)
GLUCOSE: 148 mg/dL — AB (ref 65–99)
POTASSIUM: 4 mmol/L (ref 3.5–5.1)
SODIUM: 132 mmol/L — AB (ref 135–145)
TOTAL PROTEIN: 5.8 g/dL — AB (ref 6.5–8.1)

## 2015-09-15 LAB — EXPECTORATED SPUTUM ASSESSMENT W REFEX TO RESP CULTURE

## 2015-09-15 LAB — CBC WITH DIFFERENTIAL/PLATELET
BASOS ABS: 0 10*3/uL (ref 0.0–0.1)
BASOS PCT: 0 %
EOS ABS: 0 10*3/uL (ref 0.0–0.7)
Eosinophils Relative: 0 %
HEMATOCRIT: 23.8 % — AB (ref 36.0–46.0)
HEMOGLOBIN: 8.1 g/dL — AB (ref 12.0–15.0)
Lymphocytes Relative: 14 %
Lymphs Abs: 0.9 10*3/uL (ref 0.7–4.0)
MCH: 29.8 pg (ref 26.0–34.0)
MCHC: 34 g/dL (ref 30.0–36.0)
MCV: 87.5 fL (ref 78.0–100.0)
Monocytes Absolute: 0.5 10*3/uL (ref 0.1–1.0)
Monocytes Relative: 8 %
NEUTROS ABS: 5 10*3/uL (ref 1.7–7.7)
NEUTROS PCT: 78 %
Platelets: 198 10*3/uL (ref 150–400)
RBC: 2.72 MIL/uL — ABNORMAL LOW (ref 3.87–5.11)
RDW: 14.2 % (ref 11.5–15.5)
WBC: 6.3 10*3/uL (ref 4.0–10.5)

## 2015-09-15 LAB — GLUCOSE, CAPILLARY
GLUCOSE-CAPILLARY: 103 mg/dL — AB (ref 65–99)
GLUCOSE-CAPILLARY: 89 mg/dL (ref 65–99)
GLUCOSE-CAPILLARY: 73 mg/dL (ref 65–99)
Glucose-Capillary: 75 mg/dL (ref 65–99)

## 2015-09-15 LAB — EXPECTORATED SPUTUM ASSESSMENT W GRAM STAIN, RFLX TO RESP C

## 2015-09-15 MED ORDER — ACETAMINOPHEN 325 MG PO TABS
650.0000 mg | ORAL_TABLET | Freq: Four times a day (QID) | ORAL | Status: DC | PRN
Start: 2015-09-15 — End: 2015-09-19
  Administered 2015-09-15 – 2015-09-17 (×2): 650 mg via ORAL
  Filled 2015-09-15 (×2): qty 2

## 2015-09-15 NOTE — Care Management Important Message (Signed)
Important Message  Patient Details  Name: Emily Kennedy MRN: [id number] Date of Birth: March 29, 1945   Medicare Important Message Given:  Yes    Huxton Glaus Abena 09/15/2015, 10:22 AM

## 2015-09-15 NOTE — Progress Notes (Signed)
PROGRESS NOTE    Emily Kennedy  [id number] DOB: 1945-09-24 DOA: 09/14/2015 PCP: [name], MD  Outpatient Specialists:   Brief Narrative: 70 y.o. female with medical history significant of type 2 diabetes, vasculitis, hypertension, hypothyroidism, chronic diastolic CHF, PAF who was admitted 3 months ago for altered mental status due to streptococcal and Legionella pneumonia. She is being brought today to the emergency department via EMS from [facility] due to altered mental status and fever. The patient's daughter stated that she was not acting right and has been feeling sick for a while. She complains of fever, chills, headache, nausea, decreased appetite, pleuritic chest pain and productive cough. She denies abdominal pain, vomiting, diarrhea, constipation, melena or hematochezia. She denies dysuria, frequency or hematuria. Patient has been admitted for management of likely HCAP and hyponatremia.  Assessment & Plan:   Principal Problem:   HCAP (healthcare-associated pneumonia) Active Problems:   Diabetes mellitus type 2, uncontrolled ([place])   Anemia, chronic disease   Hypothyroidism   Essential hypertension   Hyponatremia   Principal Problem:  HCAP (healthcare-associated pneumonia) - Still having fever. Continue antibiotics. Follow cultures. Follow work up already ordered.  Active Problems: - Hyponatremia, likely acute on chronic - Sodium is up  To 132 (up from 125). 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 Continue to monitor.  -Diabetes mellitus type 2, uncontrolled (HCC) - Optimize.   -Anemia, chronic disease Monitor H&H.  - Hypothyroidism Continue levothyroxine 150 g by mouth daily  - Essential hypertension - Optimize   DVT prophylaxis: Lovenox. Code Status: Full code. Family Communication: Her daughter was present in the room. Disposition Plan: Admit for IV antibiotic therapy for several days. Consults called:   Admission status: Inpatient/stepdown   Subjective: Seen alongside daughter and son. Reports chills and cough that is productive of greenish sputum. Objective: Filed Vitals:   09/15/15 0700 09/15/15 0800 09/15/15 1129 09/15/15 1500  BP: 144/72 149/57 138/60 148/55  Pulse: 80 79 89 88  Temp: 98.5 F (36.9 C)  98.7 F (37.1 C) 102.4 F (39.1 C)  TempSrc: Oral  Oral Oral  Resp: 20 27 22 24   Height:      Weight:      SpO2: 95% 93% 97% 95%    Intake/Output Summary (Last 24 hours) at 09/15/15 1702 Last data filed at 09/15/15 0953  Gross per 24 hour  Intake 1856.25 ml  Output    200 ml  Net 1656.25 ml   Filed Weights   09/14/15 2122  Weight: 61.5 kg (135 lb 9.3 oz)    Examination:  General exam: Appears calm and comfortable  Respiratory system: Decreased air entry globally. Cardiovascular system: S1 & S2.. Gastrointestinal system: Abdomen is nondistended, soft and nontender.   Central nervous system: Alert and oriented. Moves all limbs. Extremities: No leg edema.   Data Reviewed: I have personally reviewed following labs and imaging studies  CBC:  Recent Labs Lab 09/14/15 1644 09/15/15 0349  WBC 10.3 6.3  NEUTROABS 8.5* 5.0  HGB 7.5* 8.1*  HCT 21.1* 23.8*  MCV 90.2 87.5  PLT 236 [id number]   Basic Metabolic Panel:  Recent Labs Lab 09/14/15 1644 09/14/15 2111 09/15/15 0349  NA 125*  --  132*  K 4.5  --  4.0  CL 95*  --  92*  CO2 21*  --  23  GLUCOSE 191*  --  148*  BUN 23*  --  22*  CREATININE 0.82  --  0.81  CALCIUM 8.9  --  9.7  MG  --  1.7  --   PHOS  --  2.6  --    GFR: Estimated Creatinine Clearance: 57.4 mL/min (by C-G formula based on Cr of 0.81). Liver Function Tests:  Recent Labs Lab 09/14/15 1644 09/15/15 0349  AST 18 17  ALT 16 16  ALKPHOS 53 53  BILITOT 0.7 0.6  PROT 6.0* 5.8*  ALBUMIN 2.2* 2.0*   No results for input(s): LIPASE, AMYLASE in the last 168 hours. No results for input(s): AMMONIA in the last 168 hours. Coagulation  Profile: No results for input(s): INR, PROTIME in the last 168 hours. Cardiac Enzymes: No results for input(s): CKTOTAL, CKMB, CKMBINDEX, TROPONINI in the last 168 hours. BNP (last 3 results) No results for input(s): PROBNP in the last 8760 hours. HbA1C: No results for input(s): HGBA1C in the last 72 hours. CBG:  Recent Labs Lab 09/14/15 1655 09/14/15 2007 09/14/15 2213 09/15/15 0742 09/15/15 1613  GLUCAP 206* 257* 253* 75 89   Lipid Profile: No results for input(s): CHOL, HDL, LDLCALC, TRIG, CHOLHDL, LDLDIRECT in the last 72 hours. Thyroid Function Tests: No results for input(s): TSH, T4TOTAL, FREET4, T3FREE, THYROIDAB in the last 72 hours. Anemia Panel: No results for input(s): VITAMINB12, FOLATE, FERRITIN, TIBC, IRON, RETICCTPCT in the last 72 hours. Urine analysis:    Component Value Date/Time   COLORURINE YELLOW 09/14/2015 1416   APPEARANCEUR TURBID* 09/14/2015 1416   LABSPEC 1.014 09/14/2015 1416   PHURINE 6.5 09/14/2015 1416   GLUCOSEU NEGATIVE 09/14/2015 1416   HGBUR LARGE* 09/14/2015 1416   BILIRUBINUR NEGATIVE 09/14/2015 1416   KETONESUR NEGATIVE 09/14/2015 1416   PROTEINUR 100* 09/14/2015 1416   UROBILINOGEN 1.0 01/06/2011 2252   NITRITE NEGATIVE 09/14/2015 1416   LEUKOCYTESUR LARGE* 09/14/2015 1416   Sepsis Labs: @LABRCNTIP (procalcitonin:4,lacticidven:4)  ) Recent Results (from the past 240 hour(s))  Urine culture     Status: Abnormal (Preliminary result)   Collection Time: 09/14/15  2:16 PM  Result Value Ref Range Status   Specimen Description URINE, CATHETERIZED  Final   Special Requests NONE  Final   Culture >=100,000 COLONIES/mL GRAM NEGATIVE RODS (A)  Final   Report Status PENDING  Incomplete  Blood Culture (routine x 2)     Status: None (Preliminary result)   Collection Time: 09/14/15  4:48 PM  Result Value Ref Range Status   Specimen Description BLOOD LEFT WRIST  Final   Special Requests BOTTLES DRAWN AEROBIC AND ANAEROBIC 5CC  Final    Culture NO GROWTH < 24 HOURS  Final   Report Status PENDING  Incomplete  Blood Culture (routine x 2)     Status: None (Preliminary result)   Collection Time: 09/14/15  5:46 PM  Result Value Ref Range Status   Specimen Description BLOOD RIGHT ANTECUBITAL  Final   Special Requests BOTTLES DRAWN AEROBIC AND ANAEROBIC 10CC  Final   Culture NO GROWTH < 24 HOURS  Final   Report Status PENDING  Incomplete  MRSA PCR Screening     Status: None   Collection Time: 09/14/15  9:59 PM  Result Value Ref Range Status   MRSA by PCR NEGATIVE NEGATIVE Final    Comment:        The GeneXpert MRSA Assay (FDA approved for NASAL specimens only), is one component of a comprehensive MRSA colonization surveillance program. It is not intended to diagnose MRSA infection nor to guide or monitor treatment for MRSA infections.   Culture, sputum-assessment     Status: None   Collection  Time: 09/15/15  3:40 AM  Result Value Ref Range Status   Specimen Description SPUTUM  Final   Special Requests Immunocompromised  Final   Sputum evaluation   Final    THIS SPECIMEN IS ACCEPTABLE. RESPIRATORY CULTURE REPORT TO FOLLOW.   Report Status 09/15/2015 FINAL  Final  Culture, respiratory (NON-Expectorated)     Status: None (Preliminary result)   Collection Time: 09/15/15  3:40 AM  Result Value Ref Range Status   Specimen Description SPUTUM  Final   Special Requests NONE  Final   Gram Stain   Final    ABUNDANT WBC PRESENT, PREDOMINANTLY PMN RARE SQUAMOUS EPITHELIAL CELLS PRESENT FEW GRAM POSITIVE COCCI IN PAIRS RARE GRAM NEGATIVE RODS RARE GRAM NEGATIVE COCCI IN PAIRS RARE GRAM POSITIVE RODS    Culture PENDING  Incomplete   Report Status PENDING  Incomplete         Radiology Studies: Dg Chest 2 View  09/14/2015  CLINICAL DATA:  Evaluate for pneumonia. Slight shortness of breath and right-sided chest pain with fever and productive cough. EXAM: CHEST  2 VIEW COMPARISON:  07/15/2015 FINDINGS: Lungs are  adequately inflated demonstrate improved left base opacification likely improved effusion/atelectasis. Worsening patchy airspace process over the right upper lobe with stable right base opacification. Findings likely due to a pneumonia. Small right pleural effusion. Cardiomediastinal silhouette and remainder of the exam is unchanged. IMPRESSION: Interval improvement left base opacification with stable right base opacification. New airspace process over the right upper lobe likely pneumonia. Small right effusion. Electronically Signed   By: Marin Olp M.D.   On: 09/14/2015 17:18        Scheduled Meds: . carvedilol  25 mg Oral BID WC  . enoxaparin (LOVENOX) injection  40 mg Subcutaneous QHS  . ferrous sulfate  325 mg Oral BID WC  . fluconazole  150 mg Oral Daily  . guaiFENesin  1,200 mg Oral BID  . insulin aspart  0-9 Units Subcutaneous TID WC  . insulin glargine  15 Units Subcutaneous BID  . levothyroxine  150 mcg Oral QAC breakfast  . piperacillin-tazobactam (ZOSYN)  IV  3.375 g Intravenous Q8H  . sodium chloride flush  3 mL Intravenous Q12H  . vancomycin  500 mg Intravenous Q12H   Continuous Infusions: . sodium chloride 75 mL/hr at 09/15/15 1242     LOS: 1 day    Time spent: 35 Mins    Dana Allan, MD  [facility] Pager #: [phone] 7PM-7AM contact night coverage as above

## 2015-09-16 LAB — COMPREHENSIVE METABOLIC PANEL
ALK PHOS: 52 U/L (ref 38–126)
ALT: 14 U/L (ref 14–54)
AST: 14 U/L — ABNORMAL LOW (ref 15–41)
Albumin: 1.9 g/dL — ABNORMAL LOW (ref 3.5–5.0)
Anion gap: 7 (ref 5–15)
BUN: 13 mg/dL (ref 6–20)
CO2: 22 mmol/L (ref 22–32)
Calcium: 8.5 mg/dL — ABNORMAL LOW (ref 8.9–10.3)
Chloride: 101 mmol/L (ref 101–111)
Creatinine, Ser: 0.66 mg/dL (ref 0.44–1.00)
GLUCOSE: 47 mg/dL — AB (ref 65–99)
Potassium: 3.6 mmol/L (ref 3.5–5.1)
SODIUM: 130 mmol/L — AB (ref 135–145)
TOTAL PROTEIN: 5.4 g/dL — AB (ref 6.5–8.1)
Total Bilirubin: 0.6 mg/dL (ref 0.3–1.2)

## 2015-09-16 LAB — URINE CULTURE

## 2015-09-16 LAB — OSMOLALITY: Osmolality: 267 mOsm/kg — ABNORMAL LOW (ref 275–295)

## 2015-09-16 LAB — CBC WITH DIFFERENTIAL/PLATELET
BASOS ABS: 0 10*3/uL (ref 0.0–0.1)
BASOS PCT: 0 %
EOS ABS: 0 10*3/uL (ref 0.0–0.7)
EOS PCT: 1 %
HCT: 24.5 % — ABNORMAL LOW (ref 36.0–46.0)
Hemoglobin: 8.1 g/dL — ABNORMAL LOW (ref 12.0–15.0)
Lymphocytes Relative: 10 %
Lymphs Abs: 0.8 10*3/uL (ref 0.7–4.0)
MCH: 28.8 pg (ref 26.0–34.0)
MCHC: 33.1 g/dL (ref 30.0–36.0)
MCV: 87.2 fL (ref 78.0–100.0)
Monocytes Absolute: 0.5 10*3/uL (ref 0.1–1.0)
Monocytes Relative: 7 %
Neutro Abs: 6.2 10*3/uL (ref 1.7–7.7)
Neutrophils Relative %: 82 %
PLATELETS: 214 10*3/uL (ref 150–400)
RBC: 2.81 MIL/uL — AB (ref 3.87–5.11)
RDW: 13.9 % (ref 11.5–15.5)
WBC: 7.4 10*3/uL (ref 4.0–10.5)

## 2015-09-16 LAB — TSH: TSH: 2.919 u[IU]/mL (ref 0.350–4.500)

## 2015-09-16 LAB — GLUCOSE, CAPILLARY
GLUCOSE-CAPILLARY: 57 mg/dL — AB (ref 65–99)
GLUCOSE-CAPILLARY: 125 mg/dL — AB (ref 65–99)
Glucose-Capillary: 113 mg/dL — ABNORMAL HIGH (ref 65–99)
Glucose-Capillary: 132 mg/dL — ABNORMAL HIGH (ref 65–99)
Glucose-Capillary: 151 mg/dL — ABNORMAL HIGH (ref 65–99)

## 2015-09-16 LAB — CREATININE, URINE, RANDOM: Creatinine, Urine: 76.23 mg/dL

## 2015-09-16 LAB — OSMOLALITY, URINE: Osmolality, Ur: 410 mOsm/kg (ref 300–900)

## 2015-09-16 LAB — CORTISOL: Cortisol, Plasma: 13.7 ug/dL

## 2015-09-16 LAB — SODIUM, URINE, RANDOM: Sodium, Ur: 35 mmol/L

## 2015-09-16 MED ORDER — INSULIN GLARGINE 100 UNIT/ML ~~LOC~~ SOLN
7.5000 [IU] | Freq: Two times a day (BID) | SUBCUTANEOUS | Status: DC
  Administered 2015-09-16 – 2015-09-17 (×3): 8 [IU] via SUBCUTANEOUS
  Filled 2015-09-16 (×4): qty 0.08

## 2015-09-16 NOTE — NC FL2 (Signed)
[place] LEVEL OF CARE SCREENING TOOL     IDENTIFICATION  Patient Name: Emily Kennedy Birthdate: 1945/12/28 Sex: female Admission Date (Current Location): 09/14/2015  [facility] and [place] Number:  Herbalist and Address:  [facility] [place], [place] [address], [place], [place] [address]      Provider Number: [id number]  Attending Physician Name and Address:  Bonnell Public, MD  Relative Name and Phone Number:  [name] daughter, [phone]    Current Level of Care: Hospital Recommended Level of Care: [place] Prior Approval Number:    Date Approved/Denied:   PASRR Number:    Discharge Plan: SNF    Current Diagnoses: Patient Active Problem List   Diagnosis Date Noted  . Hyponatremia 09/15/2015  . HCAP (healthcare-associated pneumonia) 09/14/2015  . Respiratory arrest ([place]) intubated 4/13 post arrest 06/27/2015  . Cardiac arrest ([place]) 4/13 suspected mucus plugging as cause of arrest 06/27/2015  . Controlled diabetes mellitus type 2 with complications ([place])   . Dysphagia   . Atrial fibrillation with RVR ([place]) 06/18/2015  . Acute on chronic diastolic CHF (congestive heart failure), NYHA class 3 ([place])   . Acute encephalopathy 06/14/2015  . Anemia, chronic disease 05/18/2015  . Hypothyroidism 05/18/2015  . Essential hypertension 05/18/2015  . Diabetes mellitus type 2, uncontrolled ([place]) 04/09/2015  . 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    Orientation RESPIRATION BLADDER Height & Weight     Self, Time, Situation, Place  Normal Incontinent Weight: 61.5 kg (135 lb 9.3 oz) Height:  5\' 4"  (162.6 cm)  BEHAVIORAL SYMPTOMS/MOOD NEUROLOGICAL BOWEL NUTRITION STATUS      Incontinent Diet (carb modified)  AMBULATORY STATUS COMMUNICATION OF NEEDS Skin   Extensive Assist Verbally Normal                       Personal Care Assistance Level of Assistance  Bathing, Dressing Bathing  Assistance: Maximum assistance   Dressing Assistance: Maximum assistance     Functional Limitations Info             SPECIAL CARE FACTORS FREQUENCY  PT (By licensed PT), OT (By licensed OT)     PT Frequency: 5/wk OT Frequency: 5/wk            Contractures      Additional Factors Info  Code Status, Allergies, Insulin Sliding Scale Code Status Info: FULL Allergies Info: Benadryl, Ciprofloxacin, Furosemide, Nitrofurantoin Monohyd Macro, Septra, Sulfa Drugs Cross Reactors, Ceclor   Insulin Sliding Scale Info: 3/day       Current Medications (09/16/2015):  This is the current hospital active medication list Current Facility-Administered Medications  Medication Dose Route Frequency Provider Last Rate Last Dose  . acetaminophen (TYLENOL) tablet 650 mg  650 mg Oral Q6H PRN Bonnell Public, MD   650 mg at 09/15/15 1656  . albuterol (PROVENTIL) (2.5 MG/3ML) 0.083% nebulizer solution 2.5 mg  2.5 mg Nebulization Q6H PRN Reubin Milan, MD      . carvedilol (COREG) tablet 25 mg  25 mg Oral BID WC Reubin Milan, MD   25 mg at 09/16/15 0729  . enoxaparin (LOVENOX) injection 40 mg  40 mg Subcutaneous QHS Reubin Milan, MD   40 mg at 09/15/15 2223  . ferrous sulfate tablet 325 mg  325 mg Oral BID WC Reubin Milan, MD   325 mg at 09/16/15 0729  . guaiFENesin (MUCINEX) 12 hr tablet 1,200 mg  1,200 mg Oral BID Reubin Milan, MD   1,200 mg at 09/16/15 1056  . insulin aspart (novoLOG) injection 0-9 Units  0-9 Units Subcutaneous TID WC Reubin Milan, MD   1 Units at 09/16/15 1300  . insulin glargine (LANTUS) injection 8 Units  8 Units Subcutaneous BID Bonnell Public, MD   8 Units at 09/16/15 1409  . ipratropium (ATROVENT) nebulizer solution 0.5 mg  0.5 mg Nebulization Q6H PRN Reubin Milan, MD      . levothyroxine (SYNTHROID, LEVOTHROID) tablet 150 mcg  150 mcg Oral QAC breakfast Reubin Milan, MD   150 mcg at 09/16/15 0729  . ondansetron (ZOFRAN)  tablet 4 mg  4 mg Oral Q6H PRN Reubin Milan, MD       Or  . ondansetron [facility]) injection 4 mg  4 mg Intravenous Q6H PRN Reubin Milan, MD      . piperacillin-tazobactam (ZOSYN) IVPB 3.375 g  3.375 g Intravenous Q8H Crystal Trellis Moment, RPH   3.375 g at 09/16/15 1057  . sodium chloride flush (NS) 0.9 % injection 3 mL  3 mL Intravenous Q12H Reubin Milan, MD   3 mL at 09/16/15 1058     Discharge Medications: Please see discharge summary for a list of discharge medications.  Relevant Imaging Results:  Relevant Lab Results:   Additional Information SS# [id number]  Cranford Mon, [place]

## 2015-09-16 NOTE — Clinical Social Work Note (Signed)
Clinical Social Work Assessment  Patient Details  Name: Emily Kennedy MRN: [id number] Date of Birth: 1945/12/07  Date of referral:  09/16/15               Reason for consult:  Facility Placement                Permission sought to share information with:  Family Supports Permission granted to share information::  Yes, Verbal Permission Granted  Name::     Midwife::  [facility]  Relationship::  dtr  Contact Information:     Housing/Transportation Living arrangements for the past 2 months:  [place], [employer] of Information:  Patient, Adult Children Patient Interpreter Needed:  None Criminal Activity/Legal Involvement Pertinent to Current Situation/Hospitalization:  No - Comment as needed Significant Relationships:  Adult Children Lives with:  Adult Children Do you feel safe going back to the place where you live?  No Need for family participation in patient care:  Yes (Comment) (help with decision making)  Care giving concerns:  Pt believes she wasn't getting proper level of support at SNF but dtr states care was good and that it was the best facility they looked at.   Social Worker assessment / plan: CSW spoke with pt and pt dtr in regards to plan for time of DC.  Pt prefers not to go back to SNF but dtr works so needs pt to be more independent prior to return home.  After PT eval both pt and dtr acknowledge need for additional rehab and prefer CIR setting- consult is in and awaiting determination.  Employment status:  Retired Forensic scientist:  Medicare PT Recommendations:  [place] / Referral to community resources:  [place]  Patient/Family's Response to care:  Pt and pt dtr are agreeable to return to [facility] if CIR cannot admit.  Patient/Family's Understanding of and Emotional Response to Diagnosis, Current Treatment, and Prognosis:  No questions or concerns at this time though pt does  express frustration that she was getting so much stronger before this set back and was about to return home.  Emotional Assessment Appearance:  Appears older than stated age Attitude/Demeanor/Rapport:    Affect (typically observed):  Appropriate Orientation:  Oriented to Self, Oriented to Place, Oriented to  Time, Oriented to Situation Alcohol / Substance use:  Not Applicable Psych involvement (Current and /or in the community):  No (Comment)  Discharge Needs  Concerns to be addressed:  Care Coordination Readmission within the last 30 days:  No Current discharge risk:  Physical Impairment Barriers to Discharge:  Continued Medical Work up   [employer], LCSW 09/16/2015, 4:12 PM

## 2015-09-16 NOTE — Progress Notes (Signed)
PROGRESS NOTE    Emily Kennedy  [id number] DOB: 02/11/1946 DOA: 09/14/2015 PCP: [name], MD  Outpatient Specialists:   Brief Narrative: 70 y.o. female with medical history significant of type 2 diabetes, vasculitis, hypertension, hypothyroidism, chronic diastolic CHF, PAF who was admitted 3 months ago for altered mental status due to streptococcal and Legionella pneumonia. She is being brought today to the emergency department via EMS from [facility] due to altered mental status and fever. The patient's daughter stated that she was not acting right and has been feeling sick for a while. She complains of fever, chills, headache, nausea, decreased appetite, pleuritic chest pain and productive cough. She denies abdominal pain, vomiting, diarrhea, constipation, melena or hematochezia. She denies dysuria, frequency or hematuria. Patient has been admitted for management of likely HCAP and hyponatremia.  Assessment & Plan:   Principal Problem:   HCAP (healthcare-associated pneumonia) Active Problems:   Diabetes mellitus type 2, uncontrolled ([place])   Anemia, chronic disease   Hypothyroidism   Essential hypertension   Hyponatremia   Principal Problem:  HCAP (healthcare-associated pneumonia) - Looks better today. TMax is 102.4, but no fever since yesterday afternoon. Continue antibiotics. Follow cultures. Follow work up already ordered. Consult PT/OT.  Active Problems: - Hyponatremia, likely chronic - Sodium is 130. Will work up. 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 Continue to monitor.  -Diabetes mellitus type 2, uncontrolled ([place]) - Optimize. Will Decrease the dose of Lantus (Episodes of low blood sugar noted).    -Anemia, chronic disease Monitor H&H.  - Hypothyroidism Continue levothyroxine 150 g by mouth daily  - Essential hypertension - Optimize   DVT prophylaxis: Lovenox. Code Status: Full code. Family Communication: Her  daughter was present in the room. Disposition Plan: Admit for IV antibiotic therapy for several days. Consults called:  Admission status: Inpatient/stepdown   Subjective: Nil complaints. Seen alongside patient's Nurse. Cough and phlegm production are improving.   Objective: Filed Vitals:   09/16/15 0000 09/16/15 0400 09/16/15 0449 09/16/15 0717  BP: 162/61 145/62 146/68 159/67  Pulse: 79 70  67  Temp: 98.6 F (37 C) 98.6 F (37 C)  97.9 F (36.6 C)  TempSrc: Oral Oral  Oral  Resp: 18 19 15 18   Height:      Weight:      SpO2: 93% 94% 94% 96%    Intake/Output Summary (Last 24 hours) at 09/16/15 0828 Last data filed at 09/16/15 [id number]  Gross per 24 hour  Intake   1940 ml  Output      0 ml  Net   1940 ml   Filed Weights   09/14/15 2122  Weight: 61.5 kg (135 lb 9.3 oz)    Examination:  General exam: Appears calm and comfortable  Respiratory system: Decreased air entry globally. Cardiovascular system: S1 & S2.. Gastrointestinal system: Abdomen is nondistended, soft and nontender.   Central nervous system: Alert and oriented. Moves all limbs. Extremities: No leg edema.   Data Reviewed: I have personally reviewed following labs and imaging studies  CBC:  Recent Labs Lab 09/14/15 1644 09/15/15 0349 09/16/15 0421  WBC 10.3 6.3 7.4  NEUTROABS 8.5* 5.0 6.2  HGB 7.5* 8.1* 8.1*  HCT 21.1* 23.8* 24.5*  MCV 90.2 87.5 87.2  PLT 236 198 [id number]   Basic Metabolic Panel:  Recent Labs Lab 09/14/15 1644 09/14/15 2111 09/15/15 0349 09/16/15 0421  NA 125*  --  132* 130*  K 4.5  --  4.0 3.6  CL  95*  --  92* 101  CO2 21*  --  23 22  GLUCOSE 191*  --  148* 47*  BUN 23*  --  22* 13  CREATININE 0.82  --  0.81 0.66  CALCIUM 8.9  --  9.7 8.5*  MG  --  1.7  --   --   PHOS  --  2.6  --   --    GFR: Estimated Creatinine Clearance: 58.1 mL/min (by C-G formula based on Cr of 0.66). Liver Function Tests:  Recent Labs Lab 09/14/15 1644 09/15/15 0349 09/16/15 0421  AST 18  17 14*  ALT 16 16 14   ALKPHOS 53 53 52  BILITOT 0.7 0.6 0.6  PROT 6.0* 5.8* 5.4*  ALBUMIN 2.2* 2.0* 1.9*   No results for input(s): LIPASE, AMYLASE in the last 168 hours. No results for input(s): AMMONIA in the last 168 hours. Coagulation Profile: No results for input(s): INR, PROTIME in the last 168 hours. Cardiac Enzymes: No results for input(s): CKTOTAL, CKMB, CKMBINDEX, TROPONINI in the last 168 hours. BNP (last 3 results) No results for input(s): PROBNP in the last 8760 hours. HbA1C: No results for input(s): HGBA1C in the last 72 hours. CBG:  Recent Labs Lab 09/14/15 2213 09/15/15 0742 09/15/15 1221 09/15/15 1613 09/15/15 2201  GLUCAP 253* 75 103* 89 73   Lipid Profile: No results for input(s): CHOL, HDL, LDLCALC, TRIG, CHOLHDL, LDLDIRECT in the last 72 hours. Thyroid Function Tests: No results for input(s): TSH, T4TOTAL, FREET4, T3FREE, THYROIDAB in the last 72 hours. Anemia Panel: No results for input(s): VITAMINB12, FOLATE, FERRITIN, TIBC, IRON, RETICCTPCT in the last 72 hours. Urine analysis:    Component Value Date/Time   COLORURINE YELLOW 09/14/2015 1416   APPEARANCEUR TURBID* 09/14/2015 1416   LABSPEC 1.014 09/14/2015 1416   PHURINE 6.5 09/14/2015 1416   GLUCOSEU NEGATIVE 09/14/2015 1416   HGBUR LARGE* 09/14/2015 1416   BILIRUBINUR NEGATIVE 09/14/2015 1416   KETONESUR NEGATIVE 09/14/2015 1416   PROTEINUR 100* 09/14/2015 1416   UROBILINOGEN 1.0 01/06/2011 2252   NITRITE NEGATIVE 09/14/2015 1416   LEUKOCYTESUR LARGE* 09/14/2015 1416   Sepsis Labs: @LABRCNTIP (procalcitonin:4,lacticidven:4)  ) Recent Results (from the past 240 hour(s))  Urine culture     Status: Abnormal (Preliminary result)   Collection Time: 09/14/15  2:16 PM  Result Value Ref Range Status   Specimen Description URINE, CATHETERIZED  Final   Special Requests NONE  Final   Culture >=100,000 COLONIES/mL GRAM NEGATIVE RODS (A)  Final   Report Status PENDING  Incomplete  Blood  Culture (routine x 2)     Status: None (Preliminary result)   Collection Time: 09/14/15  4:48 PM  Result Value Ref Range Status   Specimen Description BLOOD LEFT WRIST  Final   Special Requests BOTTLES DRAWN AEROBIC AND ANAEROBIC 5CC  Final   Culture NO GROWTH < 24 HOURS  Final   Report Status PENDING  Incomplete  Blood Culture (routine x 2)     Status: None (Preliminary result)   Collection Time: 09/14/15  5:46 PM  Result Value Ref Range Status   Specimen Description BLOOD RIGHT ANTECUBITAL  Final   Special Requests BOTTLES DRAWN AEROBIC AND ANAEROBIC 10CC  Final   Culture NO GROWTH < 24 HOURS  Final   Report Status PENDING  Incomplete  MRSA PCR Screening     Status: None   Collection Time: 09/14/15  9:59 PM  Result Value Ref Range Status   MRSA by PCR NEGATIVE NEGATIVE Final  Comment:        The GeneXpert MRSA Assay (FDA approved for NASAL specimens only), is one component of a comprehensive MRSA colonization surveillance program. It is not intended to diagnose MRSA infection nor to guide or monitor treatment for MRSA infections.   Culture, sputum-assessment     Status: None   Collection Time: 09/15/15  3:40 AM  Result Value Ref Range Status   Specimen Description SPUTUM  Final   Special Requests Immunocompromised  Final   Sputum evaluation   Final    THIS SPECIMEN IS ACCEPTABLE. RESPIRATORY CULTURE REPORT TO FOLLOW.   Report Status 09/15/2015 FINAL  Final  Culture, respiratory (NON-Expectorated)     Status: None (Preliminary result)   Collection Time: 09/15/15  3:40 AM  Result Value Ref Range Status   Specimen Description SPUTUM  Final   Special Requests NONE  Final   Gram Stain   Final    ABUNDANT WBC PRESENT, PREDOMINANTLY PMN RARE SQUAMOUS EPITHELIAL CELLS PRESENT FEW GRAM POSITIVE COCCI IN PAIRS RARE GRAM NEGATIVE RODS RARE GRAM NEGATIVE COCCI IN PAIRS RARE GRAM POSITIVE RODS    Culture PENDING  Incomplete   Report Status PENDING  Incomplete          Radiology Studies: Dg Chest 2 View  09/14/2015  CLINICAL DATA:  Evaluate for pneumonia. Slight shortness of breath and right-sided chest pain with fever and productive cough. EXAM: CHEST  2 VIEW COMPARISON:  07/15/2015 FINDINGS: Lungs are adequately inflated demonstrate improved left base opacification likely improved effusion/atelectasis. Worsening patchy airspace process over the right upper lobe with stable right base opacification. Findings likely due to a pneumonia. Small right pleural effusion. Cardiomediastinal silhouette and remainder of the exam is unchanged. IMPRESSION: Interval improvement left base opacification with stable right base opacification. New airspace process over the right upper lobe likely pneumonia. Small right effusion. Electronically Signed   By: Marin Olp M.D.   On: 09/14/2015 17:18        Scheduled Meds: . carvedilol  25 mg Oral BID WC  . enoxaparin (LOVENOX) injection  40 mg Subcutaneous QHS  . ferrous sulfate  325 mg Oral BID WC  . fluconazole  150 mg Oral Daily  . guaiFENesin  1,200 mg Oral BID  . insulin aspart  0-9 Units Subcutaneous TID WC  . insulin glargine  15 Units Subcutaneous BID  . levothyroxine  150 mcg Oral QAC breakfast  . piperacillin-tazobactam (ZOSYN)  IV  3.375 g Intravenous Q8H  . sodium chloride flush  3 mL Intravenous Q12H   Continuous Infusions:     LOS: 2 days    Time spent: 35 Mins    Dana Allan, MD  [facility] Pager #: [phone] 7PM-7AM contact night coverage as above

## 2015-09-16 NOTE — Progress Notes (Addendum)
4pm CSW revisited family after PT consult- pt and dtr hopeful for CIR but agreeable to return to [facility] if CIR unable to admit  9:30am CSW spoke with pt at bedside concerning admission from SNF.  Pt was 1 day away from being able to DC home with dtr when she became sick and was admitted to hospital- pt does NOT want to return to SNF if at all possible- gave verbal permission to discuss with dtr [name].  [name] states that pt can return home with her as long as not requiring too much assistance- PT not ordered on this admission- paged MD to request  CSW signing off- will get reinvolved if PT is recommending continued SNF stay  Domenica Reamer, [place] Worker [phone]

## 2015-09-16 NOTE — Progress Notes (Signed)
Physical Therapy Evaluation Patient Details Name: Emily Kennedy MRN: [id number] DOB: 14-Jul-1945 Today's Date: 69/06/2015   History of Present Illness  70 y.o. female with medical history significant of type 2 diabetes, vasculitis, hypertension, hypothyroidism, chronic diastolic CHF, PAF who was admitted 3 months ago for altered mental status due to pneumonia. Discharged to SNF (and came in from SNF, where she was supposed to be discharged home 7/3--per son)Presented with altered mental status and fever. +pna/sepsis    Clinical Impression  Pt admitted with above diagnosis. Pt currently with functional limitations due to the deficits listed below (see PT Problem List).  Pt will benefit from skilled PT to increase their independence and safety with mobility to allow discharge to the venue listed below.    Patient required overall max assist for transfer OOB. Patient anxious re: slipper socks and multiple interruptions by staff also incr her anxiety which may have played a part in her poor performance. Son arrived at end of session and asked him to bring some of patient's shoes to eliminate this barrier to mobility.      Follow Up Recommendations Supervision/Assistance - 24 hour;CIR    Equipment Recommendations  None recommended by PT    Recommendations for Other Services OT consult     Precautions / Restrictions        Mobility  Bed Mobility Overal bed mobility: Needs Assistance Bed Mobility: Supine to Sit     Supine to sit: Mod assist;Min guard     General bed mobility comments: pt came to sitting with feet over EOB with minguard assist; however to scoot to get feet on floor needed mod assist (after attempting I'ly x 1 minute)  Transfers Overall transfer level: Needs assistance Equipment used: 1 person hand held assist Transfers: Sit to/from [employer] Transfers Sit to Stand: Max assist Stand pivot transfers: Max assist       General transfer comment: asked if  she needed the RW, "no I should be able to do this" in standing she was leaning/pushing posteriorly and to her Lt; same as pivoting to chair; pt unable to get her balance over her feet  Ambulation/Gait             General Gait Details: unable due to safety/decr balance  Stairs            Wheelchair Mobility    Modified Rankin (Stroke Patients Only)       Balance Overall balance assessment: Needs assistance Sitting-balance support: Bilateral upper extremity supported;Feet supported Sitting balance-Leahy Scale: Poor   Postural control: Posterior lean;Left lateral lean Standing balance support: Bilateral upper extremity supported Standing balance-Leahy Scale: Zero                               Pertinent Vitals/Pain Pain Assessment: No/denies pain    Home Living Family/patient expects to be discharged to:: Private residence Living Arrangements: Children Available Help at Discharge: Family;Available PRN/intermittently (per son, they do not have 24/7 (dtr works days)) Type of Home: House Home Access: Level entry     Home Layout: [place]: Environmental consultant - 2 wheels;Cane - single point;Wheelchair - Education administrator (comment)      Prior Function Level of Independence: Needs assistance   Gait / Transfers Assistance Needed: reports at SNF she was transferring into and propelling w/c modified independent; reports walking some with RW and PT (?accuracy based on perfomance during eval)  Hand Dominance   Dominant Hand: Right    Extremity/Trunk Assessment   Upper Extremity Assessment: Generalized weakness           Lower Extremity Assessment: Generalized weakness (?plantarflexion contractures (unable to assess with interrup)      Cervical / Trunk Assessment: Kyphotic;Other exceptions (sitting)  Communication   Communication: No difficulties  Cognition Arousal/Alertness: Awake/alert Behavior During Therapy: WFL for tasks  assessed/performed Overall Cognitive Status: History of cognitive impairments - at baseline Area of Impairment: Memory;Attention;Following commands;Safety/judgement;Awareness;Problem solving   Current Attention Level: Sustained Memory: Decreased short-term memory Following Commands: Follows one step commands consistently;Follows one step commands with increased time Safety/Judgement: Decreased awareness of safety;Decreased awareness of deficits Awareness: Intellectual Problem Solving: Slow processing;Difficulty sequencing;Requires verbal cues;Requires tactile cues General Comments: unable to problem solve how to scoot to EOB once sitting; poor sequencing with transfer bed to chair; on son's entrance at end of session, he was shaking his head "no" when pt would describe her abilities or amount of care family can provide    General Comments General comments (skin integrity, edema, etc.): Lab and nursing entered room mid-session with needing to complete procedures, therefore further attempts to stand with RW not attempted    Exercises        Assessment/Plan    PT Assessment Patient needs continued PT services  PT Diagnosis Difficulty walking   PT Problem List Decreased strength;Decreased activity tolerance;Decreased balance;Decreased mobility;Decreased cognition;Decreased knowledge of use of DME;Decreased safety awareness  PT Treatment Interventions DME instruction;Gait training;Functional mobility training;Therapeutic activities;Therapeutic exercise;Balance training;Cognitive remediation;Patient/family education   PT Goals (Current goals can be found in the Care Plan section) Acute Rehab PT Goals Patient Stated Goal: go home; "I'm not going back to that place" PT Goal Formulation: With patient Time For Goal Achievement: 09/30/15 Potential to Achieve Goals: Good    Frequency Min 3X/week   Barriers to discharge Decreased caregiver support son reports they cannot provide 24/7 care     Co-evaluation               End of Session Equipment Utilized During Treatment: Gait belt Activity Tolerance: Patient tolerated treatment well Patient left: in chair;with call bell/phone within reach;with chair alarm set;with nursing/sitter in room;with family/visitor present Nurse Communication: Mobility status (+2; ?better with RW)         Time: 1348-1410 PT Time Calculation (min) (ACUTE ONLY): 22 min   Charges:   PT Evaluation $PT Eval Moderate Complexity: 1 Procedure     PT G Codes:        Bliss Behnke 10-10-15, 2:40 PM Pager [phone]

## 2015-09-16 NOTE — Care Management Note (Signed)
Case Management Note  Patient Details  Name: Emily Kennedy MRN: [id number] Date of Birth: 09/13/1945  Subjective/Objective:     Met with pt and daughter @ bedside.  Pt does not want to return to SNF for rehab but family works and will not be able to provide 24/7 care.  PT recommends CIR - pt and family agree with that option.                       Expected Discharge Plan:  [place]  Discharge planning Services  CM Consult  Status of Service:  In process, will continue to follow  Girard Cooter, RN 09/16/2015, 3:50 PM

## 2015-09-17 DIAGNOSIS — I776 Arteritis, unspecified: Secondary | ICD-10-CM | POA: Insufficient documentation

## 2015-09-17 DIAGNOSIS — Z87898 Personal history of other specified conditions: Secondary | ICD-10-CM

## 2015-09-17 DIAGNOSIS — I10 Essential (primary) hypertension: Secondary | ICD-10-CM | POA: Insufficient documentation

## 2015-09-17 DIAGNOSIS — Z9289 Personal history of other medical treatment: Secondary | ICD-10-CM | POA: Insufficient documentation

## 2015-09-17 DIAGNOSIS — E1122 Type 2 diabetes mellitus with diabetic chronic kidney disease: Secondary | ICD-10-CM

## 2015-09-17 DIAGNOSIS — J189 Pneumonia, unspecified organism: Principal | ICD-10-CM

## 2015-09-17 DIAGNOSIS — D472 Monoclonal gammopathy: Secondary | ICD-10-CM

## 2015-09-17 DIAGNOSIS — E1165 Type 2 diabetes mellitus with hyperglycemia: Secondary | ICD-10-CM

## 2015-09-17 DIAGNOSIS — Z794 Long term (current) use of insulin: Secondary | ICD-10-CM

## 2015-09-17 DIAGNOSIS — E871 Hypo-osmolality and hyponatremia: Secondary | ICD-10-CM

## 2015-09-17 DIAGNOSIS — N182 Chronic kidney disease, stage 2 (mild): Secondary | ICD-10-CM

## 2015-09-17 DIAGNOSIS — M069 Rheumatoid arthritis, unspecified: Secondary | ICD-10-CM | POA: Insufficient documentation

## 2015-09-17 DIAGNOSIS — N390 Urinary tract infection, site not specified: Secondary | ICD-10-CM

## 2015-09-17 DIAGNOSIS — D62 Acute posthemorrhagic anemia: Secondary | ICD-10-CM | POA: Insufficient documentation

## 2015-09-17 DIAGNOSIS — D638 Anemia in other chronic diseases classified elsewhere: Secondary | ICD-10-CM | POA: Insufficient documentation

## 2015-09-17 LAB — RENAL FUNCTION PANEL
Albumin: 1.7 g/dL — ABNORMAL LOW (ref 3.5–5.0)
Anion gap: 9 (ref 5–15)
BUN: 13 mg/dL (ref 6–20)
CO2: 22 mmol/L (ref 22–32)
Calcium: 8.4 mg/dL — ABNORMAL LOW (ref 8.9–10.3)
Chloride: 98 mmol/L — ABNORMAL LOW (ref 101–111)
Creatinine, Ser: 0.65 mg/dL (ref 0.44–1.00)
GFR calc Af Amer: >60 mL/min (ref >60)
GFR calc non Af Amer: >60 mL/min (ref >60)
Glucose, Bld: 60 mg/dL — ABNORMAL LOW (ref 65–99)
Phosphorus: 3 mg/dL (ref 2.5–4.6)
Potassium: 3.8 mmol/L (ref 3.5–5.1)
Sodium: 129 mmol/L — ABNORMAL LOW (ref 135–145)

## 2015-09-17 LAB — GLUCOSE, CAPILLARY
GLUCOSE-CAPILLARY: 145 mg/dL — AB (ref 65–99)
Glucose-Capillary: 89 mg/dL (ref 65–99)
Glucose-Capillary: 176 mg/dL — ABNORMAL HIGH (ref 65–99)
Glucose-Capillary: 191 mg/dL — ABNORMAL HIGH (ref 65–99)

## 2015-09-17 LAB — CBC WITH DIFFERENTIAL/PLATELET
BASOS PCT: 0 %
Basophils Absolute: 0 10*3/uL (ref 0.0–0.1)
Eosinophils Absolute: 0.1 10*3/uL (ref 0.0–0.7)
Eosinophils Relative: 1 %
HEMATOCRIT: 20.6 % — AB (ref 36.0–46.0)
HEMOGLOBIN: 7.1 g/dL — AB (ref 12.0–15.0)
Lymphocytes Relative: 22 %
Lymphs Abs: 1.4 10*3/uL (ref 0.7–4.0)
MCH: 30.1 pg (ref 26.0–34.0)
MCHC: 34.5 g/dL (ref 30.0–36.0)
MCV: 87.3 fL (ref 78.0–100.0)
MONOS PCT: 6 %
Monocytes Absolute: 0.4 10*3/uL (ref 0.1–1.0)
NEUTROS ABS: 4.3 10*3/uL (ref 1.7–7.7)
NEUTROS PCT: 71 %
Platelets: 224 10*3/uL (ref 150–400)
RBC: 2.36 MIL/uL — ABNORMAL LOW (ref 3.87–5.11)
RDW: 14.2 % (ref 11.5–15.5)
WBC: 6.2 10*3/uL (ref 4.0–10.5)

## 2015-09-17 LAB — COMPREHENSIVE METABOLIC PANEL
ALBUMIN: 1.8 g/dL — AB (ref 3.5–5.0)
ALK PHOS: 50 U/L (ref 38–126)
ALT: 12 U/L — ABNORMAL LOW (ref 14–54)
ANION GAP: 8 (ref 5–15)
AST: 13 U/L — AB (ref 15–41)
BILIRUBIN TOTAL: 0.6 mg/dL (ref 0.3–1.2)
BUN: 13 mg/dL (ref 6–20)
CHLORIDE: 99 mmol/L — AB (ref 101–111)
CO2: 23 mmol/L (ref 22–32)
Calcium: 8.4 mg/dL — ABNORMAL LOW (ref 8.9–10.3)
Creatinine, Ser: 0.65 mg/dL (ref 0.44–1.00)
GFR calc Af Amer: >60 mL/min (ref >60)
GFR calc non Af Amer: >60 mL/min (ref >60)
GLUCOSE: 60 mg/dL — AB (ref 65–99)
POTASSIUM: 3.9 mmol/L (ref 3.5–5.1)
Sodium: 130 mmol/L — ABNORMAL LOW (ref 135–145)
TOTAL PROTEIN: 5.1 g/dL — AB (ref 6.5–8.1)

## 2015-09-17 LAB — CULTURE, RESPIRATORY

## 2015-09-17 LAB — CULTURE, RESPIRATORY W GRAM STAIN: Culture: NORMAL

## 2015-09-17 MED ORDER — DEXTROSE 5 % IV SOLN
1.0000 g | INTRAVENOUS | Status: DC
  Administered 2015-09-17 – 2015-09-18 (×2): 1 g via INTRAVENOUS
  Filled 2015-09-17 (×3): qty 10

## 2015-09-17 MED ORDER — GLUCERNA SHAKE PO LIQD
237.0000 mL | Freq: Three times a day (TID) | ORAL | Status: DC
  Administered 2015-09-17 – 2015-09-19 (×7): 237 mL via ORAL

## 2015-09-17 NOTE — Progress Notes (Signed)
Inpatient Diabetes Program Recommendations  AACE/ADA: New Consensus Statement on Inpatient Glycemic Control (2015)  Target Ranges:  Prepandial:   less than 140 mg/dL      Peak postprandial:   less than 180 mg/dL (1-2 hours)      Critically ill patients:  140 - 180 mg/dL   Lab Results  Component Value Date   GLUCAP 89 09/17/2015   HGBA1C 4.5* 07/08/2015    Review of Glycemic Control  Diabetes history: DM2 Outpatient Diabetes medications: Lantus 15 units bid, Novolog 0-10 units qid Current orders for Inpatient glycemic control: Novolog 0-9 units tidwc  Inpatient Diabetes Program Recommendations:    Add Lantus to 8 units QHS - (On Lantus 15 units bid at home)  Will continue to follow. Thank you. Lorenda Peck, RD, LDN, CDE Inpatient Diabetes Coordinator [phone]

## 2015-09-17 NOTE — Progress Notes (Addendum)
Patient ID: Kenidee Shirrell, female   DOB: 27-Oct-1945, 70 y.o.   MRN: [id number]    PROGRESS NOTE    Yesha Wisener  [id number] DOB: November 03, 1945 DOA: 09/14/2015  PCP: [name], MD   Brief Narrative:  70 y.o. female with type 2 diabetes, vasculitis, hypertension, hypothyroidism, chronic diastolic CHF, PAF who was admitted 3 months ago for altered mental status due to streptococcal and Legionella pneumonia. This time she presented from [facility] due to altered mental status and fever, chills, headache, nausea, decreased appetite, pleuritic chest pain and productive cough. TRh admitted for evaluation and management of presumptive HCAP.   Assessment & Plan:   Principal Problem:   SIRS secondary to HCAP (RUL, strep pneumo +) and Moraxella UTI  - pt on admission presented with fevers up to 102.73F and was therefore started on Vancomycin and Zosyn - she is clinically improving but still with Tmax 100 F, no leukocytosis and no tachycardia  - currently on Zosyn day #4, vancomycin has been discontinued July 4th, 2017 - monitor fever curve, change ABX to Rocephin  - if still with persistent fevers, will request further evaluation with CT chest   Active Problems:   Diabetes mellitus type 2 with long term insulin use - last A1C 4.8 and here noted to have recurrent hypoglycemic events - discuss with pt regular oral intake and she has been compliant - will stop long acting insulin and suspect pt can be on oral antihyperglycemic regimen vs diet control to avoid hypoglycemic events     Anemia, chronic disease - Hg is down since yesterday 8.1 --> 7.1 - no signs of active bleeding - if Hg < 7, will plan on transfusing one unit of PRBC - continue Iron supplementation     Hypothyroidism - stable, continue synthroid     Essential hypertension - reasonably stable for now - continue Coreg     Hyponatremia - encouraged PO intake - BMP In AM   DVT prophylaxis: Lovenox  Sq Code Status: Full  Family Communication: Patient at bedside  Disposition Plan: CIR vs SNF, possibly in 1-2 days depending of fever status   Consultants:   None  Procedures:   None  Antimicrobials:   Vancomycin 7/2 --> 7/4  Zosyn 7/2 --> 7/5  Rocephin 7/5 -->   Subjective: Pt reports feeling better this AM, still with intermittent productive cough.   Objective: Filed Vitals:   09/17/15 0008 09/17/15 0351 09/17/15 0355 09/17/15 0700  BP: 138/54  145/60 152/64  Pulse:    74  Temp: 98.8 F (37.1 C) 99.7 F (37.6 C)  99 F (37.2 C)  TempSrc: Oral Oral  Oral  Resp: 20  18 20   Height:      Weight:      SpO2: 96%  95% 94%    Intake/Output Summary (Last 24 hours) at 09/17/15 1001 Last data filed at 09/17/15 0900  Gross per 24 hour  Intake   1048 ml  Output    300 ml  Net    748 ml   Filed Weights   09/14/15 2122  Weight: 61.5 kg (135 lb 9.3 oz)    Examination:  General exam: Appears calm and comfortable  Respiratory system: Respiratory effort normal. Rhonchi at bases  Cardiovascular system: S1 & S2 heard, RRR. No JVD, rubs, gallops or clicks. No pedal edema. Gastrointestinal system: Abdomen is nondistended, soft and nontender. No organomegaly or masses felt.  Central nervous system: Alert and oriented. No focal neurological deficits.  Extremities: Symmetric 5 x 5 power. Skin: No rashes, lesions or ulcers Psychiatry: Judgement and insight appear normal. Mood & affect appropriate.    Data Reviewed: I have personally reviewed following labs and imaging studies  CBC:  Recent Labs Lab 09/14/15 1644 09/15/15 0349 09/16/15 0421 09/17/15 0308  WBC 10.3 6.3 7.4 6.2  NEUTROABS 8.5* 5.0 6.2 4.3  HGB 7.5* 8.1* 8.1* 7.1*  HCT 21.1* 23.8* 24.5* 20.6*  MCV 90.2 87.5 87.2 87.3  PLT 236 198 214 [id number]   Basic Metabolic Panel:  Recent Labs Lab 09/14/15 1644 09/14/15 2111 09/15/15 0349 09/16/15 0421 09/17/15 0308  NA 125*  --  132* 130* 130*  129*  K  4.5  --  4.0 3.6 3.9  3.8  CL 95*  --  92* 101 99*  98*  CO2 21*  --  23 22 23  22   GLUCOSE 191*  --  148* 47* 60*  60*  BUN 23*  --  22* 13 13  13   CREATININE 0.82  --  0.81 0.66 0.65  0.65  CALCIUM 8.9  --  9.7 8.5* 8.4*  8.4*  MG  --  1.7  --   --   --   PHOS  --  2.6  --   --  3.0   Liver Function Tests:  Recent Labs Lab 09/14/15 1644 09/15/15 0349 09/16/15 0421 09/17/15 0308  AST 18 17 14* 13*  ALT 16 16 14  12*  ALKPHOS 53 53 52 50  BILITOT 0.7 0.6 0.6 0.6  PROT 6.0* 5.8* 5.4* 5.1*  ALBUMIN 2.2* 2.0* 1.9* 1.8*  1.7*   CBG:  Recent Labs Lab 09/16/15 0832 09/16/15 1204 09/16/15 1613 09/16/15 2156 09/17/15 0740  GLUCAP 113* 125* 132* 151* 89   Thyroid Function Tests:  Recent Labs  09/16/15 1357  TSH 2.919   Urine analysis:    Component Value Date/Time   COLORURINE YELLOW 09/14/2015 1416   APPEARANCEUR TURBID* 09/14/2015 1416   LABSPEC 1.014 09/14/2015 1416   PHURINE 6.5 09/14/2015 1416   GLUCOSEU NEGATIVE 09/14/2015 1416   HGBUR LARGE* 09/14/2015 1416   BILIRUBINUR NEGATIVE 09/14/2015 1416   KETONESUR NEGATIVE 09/14/2015 1416   PROTEINUR 100* 09/14/2015 1416   UROBILINOGEN 1.0 01/06/2011 2252   NITRITE NEGATIVE 09/14/2015 1416   LEUKOCYTESUR LARGE* 09/14/2015 1416    Recent Results (from the past 240 hour(s))  Urine culture     Status: Abnormal   Collection Time: 09/14/15  2:16 PM  Result Value Ref Range Status   Specimen Description URINE, CATHETERIZED  Final   Special Requests NONE  Final   Culture >=100,000 COLONIES/mL MORGANELLA MORGANII (A)  Final   Report Status 09/16/2015 FINAL  Final   Organism ID, Bacteria MORGANELLA MORGANII (A)  Final      Susceptibility   Morganella morganii - MIC*    AMPICILLIN >=32 RESISTANT Resistant     CEFAZOLIN >=64 RESISTANT Resistant     CEFTRIAXONE <=1 SENSITIVE Sensitive     CIPROFLOXACIN <=0.25 SENSITIVE Sensitive     GENTAMICIN <=1 SENSITIVE Sensitive     IMIPENEM 1 SENSITIVE Sensitive      NITROFURANTOIN 128 RESISTANT Resistant     TRIMETH/SULFA <=20 SENSITIVE Sensitive     AMPICILLIN/SULBACTAM 16 INTERMEDIATE Intermediate     PIP/TAZO <=4 SENSITIVE Sensitive     * >=100,000 COLONIES/mL MORGANELLA MORGANII  Blood Culture (routine x 2)     Status: None (Preliminary result)   Collection Time: 09/14/15  4:48 PM  Result Value Ref  Range Status   Specimen Description BLOOD LEFT WRIST  Final   Special Requests BOTTLES DRAWN AEROBIC AND ANAEROBIC 5CC  Final   Culture NO GROWTH 2 DAYS  Final   Report Status PENDING  Incomplete  Blood Culture (routine x 2)     Status: None (Preliminary result)   Collection Time: 09/14/15  5:46 PM  Result Value Ref Range Status   Specimen Description BLOOD RIGHT ANTECUBITAL  Final   Special Requests BOTTLES DRAWN AEROBIC AND ANAEROBIC 10CC  Final   Culture NO GROWTH 2 DAYS  Final   Report Status PENDING  Incomplete  MRSA PCR Screening     Status: None   Collection Time: 09/14/15  9:59 PM  Result Value Ref Range Status   MRSA by PCR NEGATIVE NEGATIVE Final    Comment:        The GeneXpert MRSA Assay (FDA approved for NASAL specimens only), is one component of a comprehensive MRSA colonization surveillance program. It is not intended to diagnose MRSA infection nor to guide or monitor treatment for MRSA infections.   Culture, sputum-assessment     Status: None   Collection Time: 09/15/15  3:40 AM  Result Value Ref Range Status   Specimen Description SPUTUM  Final   Special Requests Immunocompromised  Final   Sputum evaluation   Final    THIS SPECIMEN IS ACCEPTABLE. RESPIRATORY CULTURE REPORT TO FOLLOW.   Report Status 09/15/2015 FINAL  Final  Culture, respiratory (NON-Expectorated)     Status: None (Preliminary result)   Collection Time: 09/15/15  3:40 AM  Result Value Ref Range Status   Specimen Description SPUTUM  Final   Special Requests NONE  Final   Gram Stain   Final    ABUNDANT WBC PRESENT, PREDOMINANTLY PMN RARE SQUAMOUS  EPITHELIAL CELLS PRESENT FEW GRAM POSITIVE COCCI IN PAIRS RARE GRAM NEGATIVE RODS RARE GRAM NEGATIVE COCCI IN PAIRS RARE GRAM POSITIVE RODS    Culture CULTURE REINCUBATED FOR BETTER GROWTH  Final   Report Status PENDING  Incomplete      Radiology Studies: Dg Chest 2 View 09/14/2015   Interval improvement left base opacification with stable right base opacification. New airspace process over the right upper lobe likely pneumonia. Small right effusion.   Scheduled Meds: . carvedilol  25 mg Oral BID WC  . enoxaparin (LOVENOX) injection  40 mg Subcutaneous QHS  . ferrous sulfate  325 mg Oral BID WC  . guaiFENesin  1,200 mg Oral BID  . insulin aspart  0-9 Units Subcutaneous TID WC  . insulin glargine  8 Units Subcutaneous BID  . levothyroxine  150 mcg Oral QAC breakfast  . piperacillin-tazobactam (ZOSYN)  IV  3.375 g Intravenous Q8H  . sodium chloride flush  3 mL Intravenous Q12H   Continuous Infusions:    LOS: 3 days   Time spent: 20 minutes   Faye Ramsay, MD [facility] Pager [phone]  If 7PM-7AM, please contact night-coverage [email or web address] Password [email or web address] 09/17/2015, 10:01 AM

## 2015-09-17 NOTE — Progress Notes (Signed)
Physical Therapy Treatment Patient Details Name: Emily Kennedy MRN: [id number] DOB: 10/21/1945 Today's Date: 09/17/2015    History of Present Illness 70 y.o. female with medical history significant of type 2 diabetes, vasculitis, hypertension, hypothyroidism, chronic diastolic CHF, PAF who was admitted 3 months ago for altered mental status due to pneumonia. Discharged to SNF (and came in from SNF, where she was supposed to be discharged home 7/3--per son)Presented with altered mental status and fever. +pna/sepsis    PT Comments    Patient less anxious with her shoes on and did better with more quiet environment. Cognitively clearer. Simulated BSC to wheelchair transfer, however pt on wide BSC going to recliner--making the reach for squat-pivot more difficult. Very motivated.    Follow Up Recommendations  Supervision/Assistance - 24 hour;CIR (?can achieve modified independent wheelchair level)     Equipment Recommendations  None recommended by PT    Recommendations for Other Services OT consult     Precautions / Restrictions Precautions Precautions: Fall;Other (comment) Precaution Comments: monitor HR    Mobility  Bed Mobility                  Transfers Overall transfer level: Needs assistance Equipment used: 1 person hand held assist Transfers: Sit to/from [employer] Transfers Sit to Stand: Mod assist   Squat pivot transfers: Total assist;+2 physical assistance     General transfer comment: on [facility] on arrival (female nurse tech reports she "stood pretty well and even took a couple steps to [facility]"). Again pt reported at SNF she could transfer bed to wheelchair without assistance and described squat-pivot. Pt with her shoes on and attempted squat-pivot x 5 with one person assist with pt achieving better anterior translation and lifting off BSC each time, however she would become anxious when trying to move her hand from [facility] to armrest and relax back to sit on BSC.  Ultimately, she was so fatigued from attempts, she required total assist to stand pivot. Did stand x1 with RW and mod assist (mostly due to posterior lean)  Ambulation/Gait             General Gait Details: unable due to safety/decr balance when standing with RW   Stairs            Wheelchair Mobility    Modified Rankin (Stroke Patients Only)       Balance Overall balance assessment: Needs assistance Sitting-balance support: No upper extremity supported;Feet supported Sitting balance-Leahy Scale: Good Sitting balance - Comments: able to weight-shift sufficiently for scooting fwd/back and for performing her own pericare   Standing balance support: Bilateral upper extremity supported Standing balance-Leahy Scale: Poor Standing balance comment: stood x 40 seconds; posterior bias, however improved compared to 7/4                    Cognition Arousal/Alertness: Awake/alert Behavior During Therapy: Anxious Overall Cognitive Status: History of cognitive impairments - at baseline Area of Impairment: Memory;Attention;Following commands;Safety/judgement;Awareness;Problem solving   Current Attention Level: Sustained (loses focus mid-task with distractions) Memory: Decreased short-term memory Following Commands: Follows one step commands consistently;Follows one step commands with increased time Safety/Judgement: Decreased awareness of deficits Awareness: Intellectual Problem Solving: Slow processing;Difficulty sequencing;Requires verbal cues;Requires tactile cues General Comments: initiated transfer off BSC x5 with pt stopping midway due to anxiety or distraction    Exercises General Exercises - Lower Extremity Ankle Circles/Pumps: AROM;AAROM;Both;10 reps;Seated (Lt ankle DF 2+) Quad Sets: AROM;Both;10 reps Long Arc Quad: AROM;Strengthening;Both;5 reps;Seated (manual resistance flexion  and extension)    General Comments        Pertinent Vitals/Pain HR 77-84  mostly, occasional readings 140s (?artifact or double-counting?)  Pain Assessment: No/denies pain    Home Living                      Prior Function            PT Goals (current goals can now be found in the care plan section) Acute Rehab PT Goals Patient Stated Goal: go home; "I'm not going back to that place" Time For Goal Achievement: 09/30/15 Progress towards PT goals: Progressing toward goals    Frequency  Min 3X/week    PT Plan Current plan remains appropriate    Co-evaluation             End of Session Equipment Utilized During Treatment: Gait belt Activity Tolerance: Patient tolerated treatment well Patient left: in chair;with call bell/phone within reach;with chair alarm set     Time: [id number] PT Time Calculation (min) (ACUTE ONLY): 29 min  Charges:  $Therapeutic Exercise: 8-22 mins $Therapeutic Activity: 8-22 mins                    G Codes:      Dellie Piasecki 10-17-2015, 10:02 AM Pager [phone]

## 2015-09-17 NOTE — Discharge Summary (Signed)
Physical Medicine and Rehabilitation Consult  Reason for Consult: Debility Referring Physician: Dr. Doyle Askew   HPI: Emily Kennedy is a 70 y.o. female with history of T2DM, RA, Vasculitis, MGUS,  HTN, back pain with BLE weakness and gait disorder,  prolonged hospitalization 06/2015 for VDRF-trach due to strep/Legionella PNA with toxic metabolic encephalopathy. She was discharged to [facility] and ultimately to SNF for for therapy and was scheduled for discharge to home 07/03. She was admitted from SNF on 07/02 with malaise with poor po intake,  MS changes due to SIRS secondary to HCAP and Moraxella UTI. She was started on IV antibiotics and treated with fluids with improvement. PT evaluation done yesterday and patient with anxiety, cognitive deficits, poor standing balance with posterior lean as well as decreased endurance.    Patient with baseline cognitive deficits and poor recall ---reports that she was using a wheelchair prior to admission in April. Daughter manages home and works.   She was able to stand and walk with help at SNF.      Review of Systems  HENT: Negative for hearing loss.   Eyes: Negative for blurred vision and double vision.  Respiratory: Positive for cough. Negative for shortness of breath.   Cardiovascular: Negative for chest pain, palpitations and leg swelling.  Gastrointestinal: Negative for heartburn, nausea and abdominal pain.  Genitourinary: Negative for dysuria and urgency.  Musculoskeletal: Negative for myalgias and back pain.  Skin: Negative for rash.  Neurological: Positive for weakness. Negative for dizziness, tingling and headaches.  Psychiatric/Behavioral: Positive for memory loss.  All other systems reviewed and are negative.   Past Medical History  Diagnosis Date  . Diabetes mellitus   . Vasculitis ([place])   . Hypertension   . Thyroid disease    Past Surgical History  Procedure Laterality Date  . Tubal ligation    . Melanoma excision       Family History  Problem Relation Age of Onset  . Cancer Sister   . Diabetes Mellitus II Mother   . Tuberculosis Father     Social History:  Was living with daughter prior to hospitalization 06/2015.  Has had difficulty walking since Jan. She  reports that she has never smoked. She has never used smokeless tobacco. She reports that she does not drink alcohol or use illicit drugs.      Allergies  Allergen Reactions  . Benadryl [Diphenhydramine] Swelling  . Ciprofloxacin Hives  . Furosemide Itching, Swelling, Rash and Other (See Comments)    Made edema worse  . Nitrofurantoin Monohyd Macro Hives  . Septra [Bactrim] Hives  . Sulfa Drugs Cross Reactors Hives  . Ceclor [Cefaclor] Rash    Breaks out in rash chest down.    Medications Prior to Admission  Medication Sig Dispense Refill  . carvedilol (COREG) 25 MG tablet Place 1 tablet (25 mg total) into feeding tube 2 (two) times daily. (Patient taking differently: Take 25 mg by mouth 2 (two) times daily. )    . diltiazem (CARDIZEM) 90 MG tablet Take 90 mg by mouth every 6 (six) hours. Midnight, 6am, noon, 6pm    . doxycycline (VIBRA-TABS) 100 MG tablet Take 100 mg by mouth every 12 (twelve) hours. 7 day course started 09/14/15    . ferrous sulfate 325 (65 FE) MG tablet Take 325 mg by mouth 2 (two) times daily with a meal.    . fluconazole (DIFLUCAN) 150 MG tablet Take 150 mg by mouth daily. 3 day course started  09/14/15    . GLUCERNA (GLUCERNA) LIQD Take 237 mLs by mouth 2 (two) times daily.    Marland Kitchen guaiFENesin (MUCINEX) 600 MG 12 hr tablet Take 1,200 mg by mouth 2 (two) times daily. 5 day course started 09/14/15    . hydrALAZINE (APRESOLINE) 25 MG tablet Take 25 mg by mouth every 6 (six) hours. Midnight, 6am, noon, 6pm    . insulin aspart (NOVOLOG) 100 UNIT/ML injection Inject 0-9 Units into the skin every 4 (four) hours. (Patient taking differently: Inject 0-10 Units into the skin 4 (four) times daily. Inject 0-10 units subcutaneously at  6:30am, 11am, 4pm, 9pm per sliding scale: CBG 0-200 0 units, 201-250 2 units, 251-300 4 units, 301-350 6 units, 351-400 8 units, >400 10 units and call MD) 10 mL 11  . insulin glargine (LANTUS) 100 UNIT/ML injection Inject 0.12 mLs (12 Units total) into the skin daily. (Patient taking differently: Inject 15 Units into the skin 2 (two) times daily. 9am, 6pm) 10 mL 11  . levothyroxine (SYNTHROID, LEVOTHROID) 150 MCG tablet Take 150 mcg by mouth daily before breakfast.    . lisinopril (PRINIVIL,ZESTRIL) 30 MG tablet Take 30 mg by mouth daily.    . Multiple Vitamin (MULTIVITAMIN WITH MINERALS) TABS tablet Take 1 tablet by mouth daily.    . Omega-3 Fatty Acids (FISH OIL) 1000 MG CAPS Take 4,000 mg by mouth daily.    Marland Kitchen OVER THE COUNTER MEDICATION Apply 1 application topically 3 (three) times daily. Antifungal cream - applied to vaginal and sacral area with brief changes every shift (3 times daily)    . predniSONE (DELTASONE) 10 MG tablet Take 10 mg by mouth daily with breakfast.    . torsemide (DEMADEX) 10 MG tablet Take 10 mg by mouth daily.    . traMADol (ULTRAM) 50 MG tablet Take 50 mg by mouth every 6 (six) hours as needed (pain).    Marland Kitchen acetaminophen (TYLENOL) 160 MG/5ML solution Place 20.3 mLs (650 mg total) into feeding tube every 6 (six) hours as needed for mild pain, headache or fever. (Patient not taking: Reported on 09/14/2015) 120 mL 0  . acetaminophen (TYLENOL) 650 MG suppository Place 1 suppository (650 mg total) rectally every 4 (four) hours as needed for mild pain or fever. (Patient not taking: Reported on 09/14/2015) 12 suppository 0  . antiseptic oral rinse (CPC / CETYLPYRIDINIUM CHLORIDE 0.05%) 0.05 % LIQD solution 7 mLs by Mouth Rinse route 2 times daily at 12 noon and 4 pm. (Patient not taking: Reported on 09/14/2015)  0  . chlorhexidine (PERIDEX) 0.12 % solution 15 mLs by Mouth Rinse route 2 (two) times daily. (Patient not taking: Reported on 09/14/2015) 120 mL 0  . diltiazem (CARDIZEM) 10 mg/ml  oral suspension Place 6 mLs (60 mg total) into feeding tube every 8 (eight) hours. (Patient not taking: Reported on 09/14/2015)    . furosemide (LASIX) 40 MG tablet Place 1 tablet (40 mg total) into feeding tube 2 (two) times daily. (Patient not taking: Reported on 09/14/2015) 30 tablet   . levothyroxine (SYNTHROID, LEVOTHROID) 100 MCG SOLR injection Inject 3.75 mLs (75 mcg total) into the vein daily. (Patient not taking: Reported on 09/14/2015) 30 each   . Nutritional Supplements (FEEDING SUPPLEMENT, GLUCERNA 1.2 CAL,) LIQD Place 1,000 mLs into feeding tube continuous. (Patient not taking: Reported on 09/14/2015)    . ondansetron (ZOFRAN) 4 MG/2ML SOLN injection Inject 2 mLs (4 mg total) into the vein every 6 (six) hours as needed for nausea. (Patient not taking: Reported  on 09/14/2015) 2 mL 0  . potassium chloride 20 MEQ/15ML (10%) SOLN Place 30 mLs (40 mEq total) into feeding tube 2 (two) times daily. (Patient not taking: Reported on 09/14/2015) 450 mL 0  . predniSONE (DELTASONE) 20 MG tablet Place 2 tablets (40 mg total) into feeding tube daily with breakfast. (Patient not taking: Reported on 09/14/2015)    . sodium chloride flush (NS) 0.9 % SOLN Inject 3 mLs into the vein every 12 (twelve) hours. (Patient not taking: Reported on 09/14/2015)      Home: Home Living Family/patient expects to be discharged to:: Private residence Living Arrangements: Children Available Help at Discharge: Family, Available PRN/intermittently (per son, they do not have 24/7 (dtr works days)) Type of Home: House Home Access: Level entry [place]: One level Bathroom Shower/Tub: Chiropodist: [place]: Environmental consultant - 2 wheels, [employer] - single point, Wheelchair - manual, Other (comment)  Functional History: Prior Function Level of Independence: Needs assistance Gait / Transfers Assistance Needed: reports at SNF she was transferring into and propelling w/c modified independent; reports walking some with  RW and PT (?accuracy based on perfomance during eval) Functional Status:  Mobility: Bed Mobility Overal bed mobility: Needs Assistance Bed Mobility: Supine to Sit Supine to sit: Mod assist, Min guard General bed mobility comments: pt came to sitting with feet over EOB with minguard assist; however to scoot to get feet on floor needed mod assist (after attempting I'ly x 1 minute) Transfers Overall transfer level: Needs assistance Equipment used: 1 person hand held assist Transfers: Sit to/from Stand, Google Transfers Sit to Stand: Mod assist Stand pivot transfers: Max assist Squat pivot transfers: Total assist, +2 physical assistance General transfer comment: on [facility] on arrival (female nurse tech reports she "stood pretty well and even took a couple steps to [facility]"). Again pt reported at SNF she could transfer bed to wheelchair without assistance and described squat-pivot. Pt with her shoes on and attempted squat-pivot x 5 with one person assist with pt achieving better anterior translation and lifting off BSC each time, however she would become anxious when trying to move her hand from [facility] to armrest and relax back to sit on BSC. Ultimately, she was so fatigued from attempts, she required total assist to stand pivot. Did stand x1 with RW and mod assist (mostly due to posterior lean) Ambulation/Gait General Gait Details: unable due to safety/decr balance when standing with RW    ADL:    Cognition: Cognition Overall Cognitive Status: History of cognitive impairments - at baseline Orientation Level: Oriented X4 Cognition Arousal/Alertness: Awake/alert Behavior During Therapy: Anxious Overall Cognitive Status: History of cognitive impairments - at baseline Area of Impairment: Memory, Attention, Following commands, Safety/judgement, Awareness, Problem solving Current Attention Level: Sustained (loses focus mid-task with distractions) Memory: Decreased short-term memory Following  Commands: Follows one step commands consistently, Follows one step commands with increased time Safety/Judgement: Decreased awareness of deficits Awareness: Intellectual Problem Solving: Slow processing, Difficulty sequencing, Requires verbal cues, Requires tactile cues General Comments: initiated transfer off BSC x5 with pt stopping midway due to anxiety or distraction  Blood pressure 152/64, pulse 74, temperature 99 F (37.2 C), temperature source Oral, resp. rate 20, height 5\' 4"  (1.626 m), weight 61.5 kg (135 lb 9.3 oz), SpO2 94 %. Physical Exam  Nursing note and vitals reviewed. Constitutional: She is oriented to person, place, and time. She appears well-developed and well-nourished. She has a sickly appearance.  HENT:  Head: Normocephalic and atraumatic.  Mouth/Throat:  Oropharynx is clear and moist.  Eyes: Conjunctivae are normal. Pupils are equal, round, and reactive to light. No scleral icterus.  Neck: Neck supple. No thyromegaly present.  Cardiovascular: Normal rate and regular rhythm.   No murmur heard. Respiratory: Effort normal and breath sounds normal. No respiratory distress. She has no wheezes.  GI: Soft. Bowel sounds are normal. She exhibits no distension. There is no tenderness.  Musculoskeletal: She exhibits no edema or tenderness.  Neurological: She is alert and oriented to person, place, and time.  Speech clear.  Able to follow basic commands BUE/BLE 4+/5 grossly throughout, except for ankle dorsi/plantar flexion 3-/5.   Skin: Skin is warm and dry. No rash noted. No erythema.  Psychiatric: She has a normal mood and affect. Her behavior is normal.    Results for orders placed or performed during the hospital encounter of 09/14/15 (from the past 24 hour(s))  Glucose, capillary     Status: Abnormal   Collection Time: 09/16/15 12:04 PM  Result Value Ref Range   Glucose-Capillary 125 (H) 65 - 99 mg/dL  Osmolality     Status: Abnormal   Collection Time: 09/16/15  1:57  PM  Result Value Ref Range   Osmolality 267 (L) 275 - 295 mOsm/kg  Cortisol     Status: None   Collection Time: 09/16/15  1:57 PM  Result Value Ref Range   Cortisol, Plasma 13.7 ug/dL  TSH     Status: None   Collection Time: 09/16/15  1:57 PM  Result Value Ref Range   TSH 2.919 0.350 - 4.500 uIU/mL  Glucose, capillary     Status: Abnormal   Collection Time: 09/16/15  4:13 PM  Result Value Ref Range   Glucose-Capillary 132 (H) 65 - 99 mg/dL  Sodium, urine, random     Status: None   Collection Time: 09/16/15  5:52 PM  Result Value Ref Range   Sodium, Ur 35 mmol/L  Creatinine, urine, random     Status: None   Collection Time: 09/16/15  5:52 PM  Result Value Ref Range   Creatinine, Urine 76.23 mg/dL  Osmolality, urine     Status: None   Collection Time: 09/16/15  5:52 PM  Result Value Ref Range   Osmolality, Ur 410 300 - 900 mOsm/kg  Glucose, capillary     Status: Abnormal   Collection Time: 09/16/15  9:56 PM  Result Value Ref Range   Glucose-Capillary 151 (H) 65 - 99 mg/dL  CBC WITH DIFFERENTIAL     Status: Abnormal   Collection Time: 09/17/15  3:08 AM  Result Value Ref Range   WBC 6.2 4.0 - 10.5 K/uL   RBC 2.36 (L) 3.87 - 5.11 MIL/uL   Hemoglobin 7.1 (L) 12.0 - 15.0 g/dL   HCT 20.6 (L) 36.0 - 46.0 %   MCV 87.3 78.0 - 100.0 fL   MCH 30.1 26.0 - 34.0 pg   MCHC 34.5 30.0 - 36.0 g/dL   RDW 14.2 11.5 - 15.5 %   Platelets 224 150 - 400 K/uL   Neutrophils Relative % 71 %   Neutro Abs 4.3 1.7 - 7.7 K/uL   Lymphocytes Relative 22 %   Lymphs Abs 1.4 0.7 - 4.0 K/uL   Monocytes Relative 6 %   Monocytes Absolute 0.4 0.1 - 1.0 K/uL   Eosinophils Relative 1 %   Eosinophils Absolute 0.1 0.0 - 0.7 K/uL   Basophils Relative 0 %   Basophils Absolute 0.0 0.0 - 0.1 K/uL  Comprehensive metabolic panel  Status: Abnormal   Collection Time: 09/17/15  3:08 AM  Result Value Ref Range   Sodium 130 (L) 135 - 145 mmol/L   Potassium 3.9 3.5 - 5.1 mmol/L   Chloride 99 (L) 101 - 111 mmol/L    CO2 23 22 - 32 mmol/L   Glucose, Bld 60 (L) 65 - 99 mg/dL   BUN 13 6 - 20 mg/dL   Creatinine, Ser 0.65 0.44 - 1.00 mg/dL   Calcium 8.4 (L) 8.9 - 10.3 mg/dL   Total Protein 5.1 (L) 6.5 - 8.1 g/dL   Albumin 1.8 (L) 3.5 - 5.0 g/dL   AST 13 (L) 15 - 41 U/L   ALT 12 (L) 14 - 54 U/L   Alkaline Phosphatase 50 38 - 126 U/L   Total Bilirubin 0.6 0.3 - 1.2 mg/dL   GFR calc non Af Amer >60 >60 mL/min   GFR calc Af Amer >60 >60 mL/min   Anion gap 8 5 - 15  Renal function panel     Status: Abnormal   Collection Time: 09/17/15  3:08 AM  Result Value Ref Range   Sodium 129 (L) 135 - 145 mmol/L   Potassium 3.8 3.5 - 5.1 mmol/L   Chloride 98 (L) 101 - 111 mmol/L   CO2 22 22 - 32 mmol/L   Glucose, Bld 60 (L) 65 - 99 mg/dL   BUN 13 6 - 20 mg/dL   Creatinine, Ser 0.65 0.44 - 1.00 mg/dL   Calcium 8.4 (L) 8.9 - 10.3 mg/dL   Phosphorus 3.0 2.5 - 4.6 mg/dL   Albumin 1.7 (L) 3.5 - 5.0 g/dL   GFR calc non Af Amer >60 >60 mL/min   GFR calc Af Amer >60 >60 mL/min   Anion gap 9 5 - 15  Glucose, capillary     Status: None   Collection Time: 09/17/15  7:40 AM  Result Value Ref Range   Glucose-Capillary 89 65 - 99 mg/dL   No results found.  Assessment/Plan: Diagnosis: Debility Labs and images independently reviewed.  Records reviewed and summated above.  1. Does the need for close, 24 hr/day medical supervision in concert with the patient's rehab needs make it unreasonable for this patient to be served in a less intensive setting? Yes  2. Co-Morbidities requiring supervision/potential complications: 123456 (Monitor in accordance with exercise and adjust meds as necessary), RA (cont meds, monitor for flares), Vasculitis (see previous), MGUS,  HTN (monitor and provide prns in accordance with increased physical exertion and pain), back pain with BLE weakness and gait disorder,  Recent prolonged hospitalization, anxiety (ensure anxiety and resulting apprehension do not limit functional progress; consider prn  medications if warranted), hyponatremia (cont to monitor, treat if necessary), acute on chronic anemia of chronic disease (transfuse if necessary to ensure appropriate perfusion for increased activity tolerance), hypothyroidism (ensure mood and energy do not limit therapies) 3. Due to safety, disease management, medication administration, pain management and patient education, does the patient require 24 hr/day rehab nursing? Yes 4. Does the patient require coordinated care of a physician, rehab nurse, PT (1-2 hrs/day, 5 days/week) and OT (1-2 hrs/day, 5 days/week) to address physical and functional deficits in the context of the above medical diagnosis(es)? Yes Addressing deficits in the following areas: balance, endurance, locomotion, strength, transferring, bathing, dressing, grooming, toileting and psychosocial support 5. Can the patient actively participate in an intensive therapy program of at least 3 hrs of therapy per day at least 5 days per week? Potentially 6. The potential  for patient to make measurable gains while on inpatient rehab is excellent 7. Anticipated functional outcomes upon discharge from inpatient rehab are mod assist and max assist  with PT, mod assist and max assist with OT, n/a with SLP. 8. Estimated rehab length of stay to reach the above functional goals is: 18-23 days. 9. Does the patient have adequate social supports and living environment to accommodate these discharge functional goals? No 10. Anticipated D/C setting: Other 11. Anticipated post D/C treatments: SNF 12. Overall Rehab/Functional Prognosis: good and fair  RECOMMENDATIONS: This patient's condition is appropriate for continued rehabilitative care in the following setting: Pt requiring total assistance with therapies for transfers without caregiver support at discharge, at SNF prior to admission, would recommend return to SNF after medically stable (consider workup of drop in Hb vs. repeat CBC). Patient has  agreed to participate in recommended program. Potentially Note that insurance prior authorization may be required for reimbursement for recommended care.  Comment: Rehab Admissions Coordinator to follow up.  Delice Lesch, MD 09/17/2015

## 2015-09-17 NOTE — Progress Notes (Signed)
Rehab Admissions Coordinator Note:  Patient was screened by Retta Diones for appropriateness for an Inpatient Acute Rehab Consult. Noted family do now want to return to SNF and PT recommending inpatient rehab consult.  At this time, we are recommending Inpatient Rehab consult.  [name] 09/17/2015, 10:23 AM  I can be reached at [phone].

## 2015-09-17 NOTE — Progress Notes (Signed)
I met with pt at bedside to discuss pt's rehab options. She has had been at [facility] since 07/19/2015.She was scheduled to be discharged when readmitted to [facility]. Daughter states pt does not have 24/7 care at home and can not d/c home until she is independent to care for herself when daughter works. Pt does have long term Medicaid per daughter. Pt not a candidate to admit to inpt rehab at this time and we recommend SNF. Pt and daughter are in agreement. I have alerted SW of plan. [phone]

## 2015-09-17 NOTE — Progress Notes (Signed)
Pharmacy Antibiotic Note  Emily Kennedy is a 70 y.o. female admitted on 09/14/2015 with Strep Pneumo pneumonia and Morganella UTI.  Pharmacy has been consulted for antibiotic renal adjustment dosing for Zosyn.  Plan: After discussion with Dr. Doyle Askew, antibiotics will be changed Ceftriaxone 1g IV every 24 hours. .  Note Cefaclor allergy but has tolerated Cefepime in April without issues.  Pharmacy will sign off consult.  Monitor closely for reaction.  Height: 5\' 4"  (162.6 cm) Weight: 135 lb 9.3 oz (61.5 kg) IBW/kg (Calculated) : 54.7  Temp (24hrs), Avg:99 F (37.2 C), Min:97.8 F (36.6 C), Max:100 F (37.8 C)   Recent Labs Lab 09/14/15 1644 09/14/15 1726 09/14/15 1942 09/15/15 0349 09/16/15 0421 09/17/15 0308  WBC 10.3  --   --  6.3 7.4 6.2  CREATININE 0.82  --   --  0.81 0.66 0.65  0.65  LATICACIDVEN  --  1.26 0.31*  --   --   --     Estimated Creatinine Clearance: 58.1 mL/min (by C-G formula based on Cr of 0.65).    Allergies  Allergen Reactions  . Benadryl [Diphenhydramine] Swelling  . Ciprofloxacin Hives  . Furosemide Itching, Swelling, Rash and Other (See Comments)    Made edema worse  . Nitrofurantoin Monohyd Macro Hives  . Septra [Bactrim] Hives  . Sulfa Drugs Cross Reactors Hives  . Ceclor [Cefaclor] Rash    Breaks out in rash chest down.    Antimicrobials this admission: Doxy PTA @ SNF Fluconazole PTA 7/2 (intended LOT 3d) >> 7/4 Vanc 7/2 >>7/4 Zosyn 7/2 >>  Dose adjustments this admission: n/a  Microbiology results: 7/2 BCx >> ngtd 7/2 UCx >> 100k Morganella (S- CTX/Cipro/Gent/Imi/Zosyn/Bactrim) 7/2 RCx >> GNR/GPC/GPR on GS >> pending results 7/2 MRSA PCR >> neg 7/2 S Pneumo urinary antigen >> positive  Thank you for allowing pharmacy to be a part of this patient's care.  Sloan Leiter, PharmD, BCPS Clinical Pharmacist [phone]  09/17/2015 1:21 PM

## 2015-09-17 NOTE — Progress Notes (Signed)
Initial Nutrition Assessment  DOCUMENTATION CODES:   Non-severe (moderate) malnutrition in context of chronic illness  INTERVENTION:   Glucerna Shake po TID, each supplement provides 220 kcal and 10 grams of protein  NUTRITION DIAGNOSIS:   Malnutrition related to chronic illness as evidenced by moderate depletion of body fat, moderate depletions of muscle mass  GOAL:   Patient will meet greater than or equal to 90% of their needs  MONITOR:   PO intake, Supplement acceptance, Labs, Weight trends, I & O's  REASON FOR ASSESSMENT:   Consult Assessment of nutrition requirement/status  ASSESSMENT:   70 y.o. Female with type 2 diabetes, vasculitis, hypertension, hypothyroidism, chronic diastolic CHF, PAF who was admitted 3 months ago for altered mental status due to streptococcal and Legionella pneumonia. This time she presented from [facility] due to altered mental status and fever, chills, headache, nausea, decreased appetite, pleuritic chest pain and productive cough. TRh admitted for evaluation and management of presumptive HCAP.   Patient reports she's eating OK >> had lunch today. PO intake 50-75% per flowsheet records. Also states she's lost "a lot" of weight, however, unable to quantify amount. Per weight readings, pt with significant weight loss of 7% in the last 4 months. She states she does enjoy drinking oral nutrition supplements.  Nutrition-Focused physical exam completed. Findings are mild to moderate fat depletion, mild to moderate muscle depletion, and no edema.   Diet Order:  Diet heart healthy/carb modified Room service appropriate?: Yes; Fluid consistency:: Thin  Skin:  Reviewed, no issues  Last BM:  7/5  Height:   Ht Readings from Last 1 Encounters:  09/14/15 5\' 4"  (1.626 m)    Weight:   Wt Readings from Last 1 Encounters:  09/14/15 135 lb 9.3 oz (61.5 kg)    Wt Readings from Last 20 Encounters:  09/14/15 135 lb 9.3 oz (61.5 kg)   06/23/15 133 lb 9.6 oz (60.6 kg)  05/19/15 145 lb 12.8 oz (66.134 kg)  04/11/15 145 lb 8.1 oz (66 kg)  07/30/14 148 lb 12.8 oz (67.495 kg)  01/29/14 163 lb 14.4 oz (74.345 kg)  11/12/11 170 lb 4.8 oz (77.248 kg)  04/19/11 172 lb 4.8 oz (78.155 kg)    Ideal Body Weight:  54.5 kg  BMI:  Body mass index is 23.26 kg/(m^2).  Estimated Nutritional Needs:   Kcal:  1500-1700  Protein:  70-80 gm  Fluid:  1.5-1.7 L  EDUCATION NEEDS:   No education needs identified at this time  Arthur Holms, RD, LDN Pager #: [phone] After-Hours Pager #: [phone]

## 2015-09-18 DIAGNOSIS — E440 Moderate protein-calorie malnutrition: Secondary | ICD-10-CM | POA: Insufficient documentation

## 2015-09-18 LAB — LEGIONELLA PNEUMOPHILA SEROGP 1 UR AG: L. PNEUMOPHILA SEROGP 1 UR AG: NEGATIVE

## 2015-09-18 LAB — TYPE AND SCREEN
ABO/RH(D): B POS
ANTIBODY SCREEN: POSITIVE
DAT, IGG: NEGATIVE
UNIT DIVISION: 0
Unit division: 0

## 2015-09-18 LAB — CBC
HCT: 21 % — ABNORMAL LOW (ref 36.0–46.0)
HEMOGLOBIN: 7.2 g/dL — AB (ref 12.0–15.0)
MCH: 29.4 pg (ref 26.0–34.0)
MCHC: 34.3 g/dL (ref 30.0–36.0)
MCV: 85.7 fL (ref 78.0–100.0)
PLATELETS: 213 10*3/uL (ref 150–400)
RBC: 2.45 MIL/uL — AB (ref 3.87–5.11)
RDW: 14.1 % (ref 11.5–15.5)
WBC: 5.5 10*3/uL (ref 4.0–10.5)

## 2015-09-18 LAB — GLUCOSE, CAPILLARY
GLUCOSE-CAPILLARY: 118 mg/dL — AB (ref 65–99)
GLUCOSE-CAPILLARY: 172 mg/dL — AB (ref 65–99)
GLUCOSE-CAPILLARY: 112 mg/dL — AB (ref 65–99)
GLUCOSE-CAPILLARY: 189 mg/dL — AB (ref 65–99)

## 2015-09-18 LAB — MAGNESIUM: MAGNESIUM: 1.8 mg/dL (ref 1.7–2.4)

## 2015-09-18 LAB — BASIC METABOLIC PANEL
ANION GAP: 6 (ref 5–15)
BUN: 11 mg/dL (ref 6–20)
CALCIUM: 8.4 mg/dL — AB (ref 8.9–10.3)
CO2: 25 mmol/L (ref 22–32)
Chloride: 101 mmol/L (ref 101–111)
Creatinine, Ser: 0.64 mg/dL (ref 0.44–1.00)
Glucose, Bld: 114 mg/dL — ABNORMAL HIGH (ref 65–99)
POTASSIUM: 4.1 mmol/L (ref 3.5–5.1)
SODIUM: 132 mmol/L — AB (ref 135–145)

## 2015-09-18 NOTE — Progress Notes (Signed)
Called report to 5 [place] spoke to [employer]. Pt will transfer per bed to 5 [facility] room 04.

## 2015-09-18 NOTE — Care Management Important Message (Signed)
Important Message  Patient Details  Name: Emily Kennedy MRN: [id number] Date of Birth: 17-Jun-1945   Medicare Important Message Given:  Yes    Kiven Vangilder, Leroy Sea 09/18/2015, 10:20 AM

## 2015-09-18 NOTE — Progress Notes (Signed)
Patient ID: Emily Kennedy, female   DOB: [birth year], 70 y.o.   MRN: [id number]    PROGRESS NOTE    Emily Kennedy  [id number] DOB: 1945/05/20 DOA: 09/14/2015  PCP: [name], MD   Brief Narrative:  70 y.o. female with type 2 diabetes, vasculitis, hypertension, hypothyroidism, chronic diastolic CHF, PAF who was admitted 3 months ago for altered mental status due to streptococcal and Legionella pneumonia. This time she presented from [facility] due to altered mental status and fever, chills, headache, nausea, decreased appetite, pleuritic chest pain and productive cough. TRh admitted for evaluation and management of presumptive HCAP.   Assessment & Plan:   Principal Problem:   SIRS secondary to HCAP (RUL, strep pneumo +) and Moraxella UTI  - pt on admission presented with fevers up to 102.40F and was therefore started on Vancomycin and Zosyn - she is clinically improving but still with Tmax 99 F, no leukocytosis and no tachycardia  - on July 5th, Zosyn day #4, vancomycin has been discontinued July 4th, 2017 - monitor fever curve, changed ABX to Rocephin 7/5 - if still with persistent fevers, will request further evaluation with CT chest   Active Problems:   Diabetes mellitus type 2 with long term insulin use - last A1C 4.8 and here noted to have recurrent hypoglycemic events - discuss with pt regular oral intake and she has been compliant - no hypoglycemic events in the past 24 hours, long acting insulin has been discontinued     Anemia, chronic disease - Hg is down since yesterday 8.1 --> 7.1 --> 7.2 - no signs of active bleeding - if Hg < 7, will plan on transfusing one unit of PRBC - continue Iron supplementation     Hypothyroidism - stable, continue synthroid     Essential hypertension - reasonably stable for now - continue Coreg     Hyponatremia - encouraged PO intake - improving - BMP In AM   DVT prophylaxis: Lovenox Sq Code Status: Full    Family Communication: Patient at bedside  Disposition Plan: SNF, possibly in 1-2 days depending of fever status   Consultants:   PM&R  Procedures:   None  Antimicrobials:   Vancomycin 7/2 --> 7/4  Zosyn 7/2 --> 7/5  Rocephin 7/5 -->  Subjective: Pt reports feeling better this AM, still with intermittent productive cough, unsteady gait, requiring two people assistance.   Objective: Filed Vitals:   09/17/15 2049 09/18/15 0028 09/18/15 0337 09/18/15 0755  BP: 148/62 161/65 151/68 163/67  Pulse: 78 78 85 75  Temp: 98.7 F (37.1 C) 98.2 F (36.8 C) 98.2 F (36.8 C) 99.1 F (37.3 C)  TempSrc: Oral Oral Oral Oral  Resp: 23 28 21 24   Height:      Weight:      SpO2: 97% 99% 95% 96%    Intake/Output Summary (Last 24 hours) at 09/18/15 1201 Last data filed at 09/18/15 0908  Gross per 24 hour  Intake    326 ml  Output      0 ml  Net    326 ml   Filed Weights   09/14/15 2122  Weight: 61.5 kg (135 lb 9.3 oz)    Examination:  General exam: Appears calm and comfortable  Respiratory system: Respiratory effort normal. Rhonchi at bases mild Cardiovascular system: S1 & S2 heard, RRR. No JVD, rubs, gallops or clicks. No pedal edema. Gastrointestinal system: Abdomen is nondistended, soft and nontender. No organomegaly or masses felt.  Central nervous  system: Alert and oriented. No focal neurological deficits.   Data Reviewed: I have personally reviewed following labs and imaging studies  CBC:  Recent Labs Lab 09/14/15 1644 09/15/15 0349 09/16/15 0421 09/17/15 0308 09/18/15 0400  WBC 10.3 6.3 7.4 6.2 5.5  NEUTROABS 8.5* 5.0 6.2 4.3  --   HGB 7.5* 8.1* 8.1* 7.1* 7.2*  HCT 21.1* 23.8* 24.5* 20.6* 21.0*  MCV 90.2 87.5 87.2 87.3 85.7  PLT 236 198 214 224 [id number]   Basic Metabolic Panel:  Recent Labs Lab 09/14/15 1644 09/14/15 2111 09/15/15 0349 09/16/15 0421 09/17/15 0308 09/18/15 0400  NA 125*  --  132* 130* 130*  129* 132*  K 4.5  --  4.0 3.6 3.9  3.8  4.1  CL 95*  --  92* 101 99*  98* 101  CO2 21*  --  23 22 23  22 25   GLUCOSE 191*  --  148* 47* 60*  60* 114*  BUN 23*  --  22* 13 13  13 11   CREATININE 0.82  --  0.81 0.66 0.65  0.65 0.64  CALCIUM 8.9  --  9.7 8.5* 8.4*  8.4* 8.4*  MG  --  1.7  --   --   --  1.8  PHOS  --  2.6  --   --  3.0  --    Liver Function Tests:  Recent Labs Lab 09/14/15 1644 09/15/15 0349 09/16/15 0421 09/17/15 0308  AST 18 17 14* 13*  ALT 16 16 14  12*  ALKPHOS 53 53 52 50  BILITOT 0.7 0.6 0.6 0.6  PROT 6.0* 5.8* 5.4* 5.1*  ALBUMIN 2.2* 2.0* 1.9* 1.8*  1.7*   CBG:  Recent Labs Lab 09/17/15 0740 09/17/15 1209 09/17/15 1642 09/17/15 2112 09/18/15 0755  GLUCAP 89 176* 145* 191* 118*   Thyroid Function Tests:  Recent Labs  09/16/15 1357  TSH 2.919   Urine analysis:    Component Value Date/Time   COLORURINE YELLOW 09/14/2015 1416   APPEARANCEUR TURBID* 09/14/2015 1416   LABSPEC 1.014 09/14/2015 1416   PHURINE 6.5 09/14/2015 1416   GLUCOSEU NEGATIVE 09/14/2015 1416   HGBUR LARGE* 09/14/2015 1416   BILIRUBINUR NEGATIVE 09/14/2015 1416   KETONESUR NEGATIVE 09/14/2015 1416   PROTEINUR 100* 09/14/2015 1416   UROBILINOGEN 1.0 01/06/2011 2252   NITRITE NEGATIVE 09/14/2015 1416   LEUKOCYTESUR LARGE* 09/14/2015 1416    Recent Results (from the past 240 hour(s))  Urine culture     Status: Abnormal   Collection Time: 09/14/15  2:16 PM  Result Value Ref Range Status   Specimen Description URINE, CATHETERIZED  Final   Special Requests NONE  Final   Culture >=100,000 COLONIES/mL MORGANELLA MORGANII (A)  Final   Report Status 09/16/2015 FINAL  Final   Organism ID, Bacteria MORGANELLA MORGANII (A)  Final      Susceptibility   Morganella morganii - MIC*    AMPICILLIN >=32 RESISTANT Resistant     CEFAZOLIN >=64 RESISTANT Resistant     CEFTRIAXONE <=1 SENSITIVE Sensitive     CIPROFLOXACIN <=0.25 SENSITIVE Sensitive     GENTAMICIN <=1 SENSITIVE Sensitive     IMIPENEM 1 SENSITIVE  Sensitive     NITROFURANTOIN 128 RESISTANT Resistant     TRIMETH/SULFA <=20 SENSITIVE Sensitive     AMPICILLIN/SULBACTAM 16 INTERMEDIATE Intermediate     PIP/TAZO <=4 SENSITIVE Sensitive     * >=100,000 COLONIES/mL MORGANELLA MORGANII  Blood Culture (routine x 2)     Status: None (Preliminary result)   Collection  Time: 09/14/15  4:48 PM  Result Value Ref Range Status   Specimen Description BLOOD LEFT WRIST  Final   Special Requests BOTTLES DRAWN AEROBIC AND ANAEROBIC 5CC  Final   Culture NO GROWTH 3 DAYS  Final   Report Status PENDING  Incomplete  Blood Culture (routine x 2)     Status: None (Preliminary result)   Collection Time: 09/14/15  5:46 PM  Result Value Ref Range Status   Specimen Description BLOOD RIGHT ANTECUBITAL  Final   Special Requests BOTTLES DRAWN AEROBIC AND ANAEROBIC 10CC  Final   Culture NO GROWTH 3 DAYS  Final   Report Status PENDING  Incomplete  MRSA PCR Screening     Status: None   Collection Time: 09/14/15  9:59 PM  Result Value Ref Range Status   MRSA by PCR NEGATIVE NEGATIVE Final    Comment:        The GeneXpert MRSA Assay (FDA approved for NASAL specimens only), is one component of a comprehensive MRSA colonization surveillance program. It is not intended to diagnose MRSA infection nor to guide or monitor treatment for MRSA infections.   Culture, sputum-assessment     Status: None   Collection Time: 09/15/15  3:40 AM  Result Value Ref Range Status   Specimen Description SPUTUM  Final   Special Requests Immunocompromised  Final   Sputum evaluation   Final    THIS SPECIMEN IS ACCEPTABLE. RESPIRATORY CULTURE REPORT TO FOLLOW.   Report Status 09/15/2015 FINAL  Final  Culture, respiratory (NON-Expectorated)     Status: None   Collection Time: 09/15/15  3:40 AM  Result Value Ref Range Status   Specimen Description SPUTUM  Final   Special Requests NONE  Final   Gram Stain   Final    ABUNDANT WBC PRESENT, PREDOMINANTLY PMN RARE SQUAMOUS EPITHELIAL  CELLS PRESENT FEW GRAM POSITIVE COCCI IN PAIRS RARE GRAM NEGATIVE RODS RARE GRAM NEGATIVE COCCI IN PAIRS RARE GRAM POSITIVE RODS    Culture Consistent with normal respiratory flora.  Final   Report Status 09/17/2015 FINAL  Final      Radiology Studies: Dg Chest 2 View 09/14/2015   Interval improvement left base opacification with stable right base opacification. New airspace process over the right upper lobe likely pneumonia. Small right effusion.   Scheduled Meds: . carvedilol  25 mg Oral BID WC  . cefTRIAXone (ROCEPHIN)  IV  1 g Intravenous Q24H  . enoxaparin (LOVENOX) injection  40 mg Subcutaneous QHS  . feeding supplement (GLUCERNA SHAKE)  237 mL Oral TID BM  . ferrous sulfate  325 mg Oral BID WC  . guaiFENesin  1,200 mg Oral BID  . insulin aspart  0-9 Units Subcutaneous TID WC  . levothyroxine  150 mcg Oral QAC breakfast  . sodium chloride flush  3 mL Intravenous Q12H   Continuous Infusions:    LOS: 4 days   Time spent: 20 minutes   Faye Ramsay, MD [facility] Pager [phone]  If 7PM-7AM, please contact night-coverage [email or web address] Password [email or web address] 09/18/2015, 12:01 PM

## 2015-09-18 NOTE — Progress Notes (Addendum)
Pt transferred to 5 West bed 4 per bed with all belongings including upper & lower  dentures and cell phone Family aware of pt's transfer to new room.

## 2015-09-18 NOTE — Progress Notes (Signed)
Notified on call provider in regards to rash seen on assessment. No new orders. Patient has no c/o pain or itching. Will continue to monitor

## 2015-09-18 NOTE — Progress Notes (Signed)
Physical Therapy Treatment Patient Details Name: Emily Kennedy MRN: [id number] DOB: 1945/09/04 Today's Date: 09/18/2015    History of Present Illness 70 y.o. female with medical history significant of type 2 diabetes, vasculitis, hypertension, hypothyroidism, chronic diastolic CHF, PAF who was admitted 3 months ago for altered mental status due to pneumonia. Discharged to SNF (and came in from SNF, where she was supposed to be discharged home 7/3--per son)Presented with altered mental status and fever. +pna/sepsis    PT Comments    Patient continues to improve cognitively and functionally. Although pt reported prior independence with squat-pivot transfers, decided to attempt RW today and pt did much better! Even walked 3 feet!. Noted change in plans for SNF at d/c.    Follow Up Recommendations  Supervision/Assistance - 24 hour;SNF (cognition improving; eventually ?<24 hr assist)     Equipment Recommendations  None recommended by PT    Recommendations for Other Services       Precautions / Restrictions Precautions Precautions: Fall;Other (comment) Precaution Comments: monitor HR Restrictions Weight Bearing Restrictions: No    Mobility  Bed Mobility Overal bed mobility: Needs Assistance Bed Mobility: Supine to Sit     Supine to sit: Min assist     General bed mobility comments: much stronger today, min assist to raise torso  Transfers Overall transfer level: Needs assistance Equipment used: Rolling walker (2 wheeled) (shoes) Transfers: Sit to/from Stand Sit to Stand: Min assist         General transfer comment: Stood from bed with minimal assist; less posterior and left bias and able to partially correct with cues  Ambulation/Gait Ambulation/Gait assistance: Min assist Ambulation Distance (Feet): 3 Feet Assistive device: Rolling walker (2 wheeled) Gait Pattern/deviations: Step-to pattern;Decreased stride length   Gait velocity interpretation: Below normal  speed for age/gender     Stairs            Wheelchair Mobility    Modified Rankin (Stroke Patients Only)       Balance Overall balance assessment: Needs assistance   Sitting balance-Leahy Scale: Good     Standing balance support: Bilateral upper extremity supported Standing balance-Leahy Scale: Poor                      Cognition Arousal/Alertness: Awake/alert Behavior During Therapy: Anxious Overall Cognitive Status: History of cognitive impairments - at baseline Area of Impairment: Memory;Safety/judgement;Awareness;Problem solving   Current Attention Level:  (loses focus mid-task with distractions) Memory: Decreased short-term memory;Decreased recall of precautions   Safety/Judgement: Decreased awareness of deficits;Decreased awareness of safety Awareness: Intellectual Problem Solving: Requires verbal cues;Requires tactile cues      Exercises      General Comments General comments (skin integrity, edema, etc.): wife present      Pertinent Vitals/Pain Pain Assessment: No/denies pain    Home Living                      Prior Function            PT Goals (current goals can now be found in the care plan section) Acute Rehab PT Goals Patient Stated Goal: get better again Time For Goal Achievement: 09/30/15 Progress towards PT goals: Progressing toward goals (gait goal added)    Frequency  Min 2X/week    PT Plan Discharge plan needs to be updated (CIR denied)    Co-evaluation             End of Session Equipment Utilized During Treatment:  Gait belt Activity Tolerance: Patient tolerated treatment well Patient left: in chair;with call bell/phone within reach;with chair alarm set;with nursing/sitter in room     Time: [id number] PT Time Calculation (min) (ACUTE ONLY): 16 min  Charges:  $Therapeutic Activity: 8-22 mins                    G Codes:      Emily Kennedy 2015-10-02, 10:31 AM Pager [phone]

## 2015-09-18 NOTE — Progress Notes (Signed)
NURSING PROGRESS NOTE  Emily Kennedy [id number] Transfer Data: 09/18/2015 4:39 PM Attending Provider: Theodis Blaze, MD [id number], MD Code Status: Full  Emily Kennedy is a 70 y.o. female patient transferred from [facility]   -No acute distress noted.  -No complaints of shortness of breath.  -No complaints of chest pain.     Blood pressure 139/74, pulse 78, temperature 98.3 F (36.8 C), temperature source Oral, resp. rate 19, height 5\' 4"  (1.626 m), weight 61.5 kg (135 lb 9.3 oz), SpO2 96 %.   IV Fluids:  IV in place, may refer to documentation.   Allergies:  Benadryl; Ciprofloxacin; Furosemide; Nitrofurantoin monohyd macro; Septra; Sulfa drugs cross reactors; and Ceclor  Past Medical History:   has a past medical history of Diabetes mellitus; Vasculitis ([place]); Hypertension; and Thyroid disease.  Past Surgical History:   has past surgical history that includes Tubal ligation and Melanoma excision.  Social History:   reports that she has never smoked. She has never used smokeless tobacco. She reports that she does not drink alcohol or use illicit drugs.  Skin: Intact  Patient/Family orientated to room. Information packet given to patient/family. Admission inpatient armband information verified with patient/family to include name and date of birth and placed on patient arm. Side rails up x 2, fall assessment and education completed with patient/family. Patient/family able to verbalize understanding of risk associated with falls and verbalized understanding to call for assistance before getting out of bed. Call light within reach. Patient/family able to voice and demonstrate understanding of unit orientation instructions.    Will continue to evaluate and treat per MD orders.

## 2015-09-19 LAB — GLUCOSE, CAPILLARY
GLUCOSE-CAPILLARY: 135 mg/dL — AB (ref 65–99)
Glucose-Capillary: 158 mg/dL — ABNORMAL HIGH (ref 65–99)

## 2015-09-19 LAB — BASIC METABOLIC PANEL
ANION GAP: 6 (ref 5–15)
BUN: 12 mg/dL (ref 6–20)
CO2: 24 mmol/L (ref 22–32)
Calcium: 8.3 mg/dL — ABNORMAL LOW (ref 8.9–10.3)
Chloride: 101 mmol/L (ref 101–111)
Creatinine, Ser: 0.6 mg/dL (ref 0.44–1.00)
GFR calc Af Amer: >60 mL/min (ref >60)
Glucose, Bld: 104 mg/dL — ABNORMAL HIGH (ref 65–99)
POTASSIUM: 4.2 mmol/L (ref 3.5–5.1)
SODIUM: 131 mmol/L — AB (ref 135–145)

## 2015-09-19 LAB — CBC
HCT: 22.7 % — ABNORMAL LOW (ref 36.0–46.0)
Hemoglobin: 7.8 g/dL — ABNORMAL LOW (ref 12.0–15.0)
MCH: 29.8 pg (ref 26.0–34.0)
MCHC: 34.4 g/dL (ref 30.0–36.0)
MCV: 86.6 fL (ref 78.0–100.0)
PLATELETS: 254 10*3/uL (ref 150–400)
RBC: 2.62 MIL/uL — AB (ref 3.87–5.11)
RDW: 14.2 % (ref 11.5–15.5)
WBC: 5.6 10*3/uL (ref 4.0–10.5)

## 2015-09-19 MED ORDER — ACETAMINOPHEN 325 MG PO TABS: 650.0000 mg | ORAL_TABLET | Freq: Four times a day (QID) | ORAL | Status: DC | PRN

## 2015-09-19 MED ORDER — IPRATROPIUM-ALBUTEROL 0.5-2.5 (3) MG/3ML IN SOLN: 3.0000 mL | Freq: Four times a day (QID) | RESPIRATORY_TRACT | Status: DC | PRN

## 2015-09-19 MED ORDER — DOXYCYCLINE HYCLATE 100 MG PO TABS: 100.0000 mg | ORAL_TABLET | Freq: Two times a day (BID) | ORAL | Status: DC

## 2015-09-19 MED ORDER — CARVEDILOL 25 MG PO TABS: 25.0000 mg | ORAL_TABLET | Freq: Two times a day (BID) | ORAL | Status: DC

## 2015-09-19 NOTE — Progress Notes (Signed)
Transport here to pick up patient to take to snf.  Pt still has no s/s of any acute distress or c/o pain.

## 2015-09-19 NOTE — Clinical Social Work Note (Signed)
Patient discharged back to [facility] today, transported by ambulance. SNF advised of discharge and d/c clinicals transmitted to facility.  Daughter Haroldine Laws [phone]) contacted and informed of discharge.  Terika Pillard Givens, MSW, LCSW Licensed Clinical Social Worker [place] [phone]

## 2015-09-19 NOTE — Progress Notes (Signed)
Notified Education officer, museum of pt being ready for transport.  Pt has no c/o pain or any s/s of acute distress.  AVS summary attached to transport packet.  Attempted multiple times to call report.  No nurse available for report.  Left message for nurse:  if they want report to call me back on my phone.

## 2015-09-19 NOTE — Discharge Summary (Signed)
Physician Discharge Summary  Emily Kennedy [id number] DOB: 09-03-1945 DOA: 09/14/2015  PCP: [name], MD  Admit date: 09/14/2015 Discharge date: 09/19/2015  Recommendations for Outpatient Follow-up:  1. Pt will need to follow up with PCP in 1 weeks post discharge 2. Please obtain BMP to evaluate electrolytes and kidney function 3. Please also check CBC to evaluate Hg and Hct levels 4. Complete treatment with doxycycline for 3 more days post discharge  5. Please note that pt was lanuts which was discontinued due to hypoglycemia and pt was only on SSI, this will be resumed upon discharge with close monitoring and consideration of observation of insulin in an effort to avoid hypoglycemia 6. Please note that pt reported she is no longer taking lasix or hydralazine    Discharge Diagnoses:  Principal Problem:   HCAP (healthcare-associated pneumonia) Active Problems:   Diabetes mellitus type 2, uncontrolled ([place])   Anemia, chronic disease   Hypothyroidism  Discharge Condition: Stable  Diet recommendation: Heart healthy diet discussed in details   Brief Narrative:  70 y.o. female with type 2 diabetes, vasculitis, hypertension, hypothyroidism, chronic diastolic CHF, PAF who was admitted 3 months ago for altered mental status due to streptococcal and Legionella pneumonia. This time she presented from [facility] due to altered mental status and fever, chills, headache, nausea, decreased appetite, pleuritic chest pain and productive cough. [facility] admitted for evaluation and management of presumptive HCAP.   Assessment & Plan:  Principal Problem:  SIRS secondary to HCAP (RUL, strep pneumo +) and Moraxella UTI  - pt on admission presented with fevers up to 102.53F and was therefore started on Vancomycin and Zosyn - she is clinically improving, no leukocytosis and no tachycardia  - on July 5th, Zosyn day #4, vancomycin has been discontinued July 4th, 2017 - changed ABX  to Rocephin 7/5 - no further fevers, no leukocytosis - pt will be transitioned to oral doxy to complete ABX therapy   Active Problems:  Diabetes mellitus type 2 with long term insulin use - last A1C 4.8 and here noted to have recurrent hypoglycemic events - discuss with pt regular oral intake and she has been compliant - no hypoglycemic events in the past 24 hours, long acting insulin has been discontinued  - keep on SSI for now only    Anemia, chronic disease - Hg is down since yesterday 8.1 --> 7.1 --> 7.2 --> 7.8 - no signs of active bleeding - no transfusions needed  - continue Iron supplementation    Hypothyroidism - stable, continue synthroid    Essential hypertension - reasonably stable for now - continue Coreg, lisinopril, cardizem    Hyponatremia - encouraged PO intake - improving   DVT prophylaxis: Lovenox Sq Code Status: Full  Family Communication: Patient at bedside, daughter over the phone  Disposition Plan: SNF  Consultants:   PM&R  Procedures:   None  Antimicrobials:   Vancomycin 7/2 --> 7/4  Zosyn 7/2 --> 7/5  Rocephin 7/5 -->  Procedures/Studies: Dg Chest 2 View  09/14/2015  CLINICAL DATA:  Evaluate for pneumonia. Slight shortness of breath and right-sided chest pain with fever and productive cough. EXAM: CHEST  2 VIEW COMPARISON:  07/15/2015 FINDINGS: Lungs are adequately inflated demonstrate improved left base opacification likely improved effusion/atelectasis. Worsening patchy airspace process over the right upper lobe with stable right base opacification. Findings likely due to a pneumonia. Small right pleural effusion. Cardiomediastinal silhouette and remainder of the exam is unchanged. IMPRESSION: Interval improvement left base opacification  with stable right base opacification. New airspace process over the right upper lobe likely pneumonia. Small right effusion. Electronically Signed   By: Marin Olp M.D.   On: 09/14/2015 17:18    Ir Gastrostomy Tube Removal  08/27/2015  INDICATION: History of stroke, post percutaneous gastrostomy tube placement 07/11/2015 by Dr. Barbie Banner. The gastrostomy tube has functioned well throughout duration of usage. Fortunately, the patient has regained swallowing function and is no longer in need of the percutaneous gastrostomy tube. EXAM: BEDSIDE REMOVAL OF GASTROSTOMY TUBE COMPARISON:  Fluoroscopic guided gastrostomy tube placement - 07/10/2025 MEDICATIONS: None. CONTRAST:  None FLUOROSCOPY TIME:  None COMPLICATIONS: None immediate. PROCEDURE: A time-out was performed prior to the initiation of the procedure. The track of the existing gastrostomy tube was lubricated with viscous lidocaine. Using manual traction, the existing pull-through gastrostomy tube was removed intact. A dressing was placed. The patient tolerated the procedure well without immediate postprocedural complication. IMPRESSION: Successful bedside removal of pull-through gastrostomy tube without complication Electronically Signed   By: Sandi Mariscal M.D.   On: 08/27/2015 12:54    Discharge Exam: Filed Vitals:   09/18/15 2135 09/19/15 0542  BP: 150/50 167/63  Pulse: 80 78  Temp: 98.5 F (36.9 C) 98.3 F (36.8 C)  Resp: 16 16   Filed Vitals:   09/18/15 1220 09/18/15 1524 09/18/15 2135 09/19/15 0542  BP: 137/58 139/74 150/50 167/63  Pulse: 75 78 80 78  Temp: 98.4 F (36.9 C) 98.3 F (36.8 C) 98.5 F (36.9 C) 98.3 F (36.8 C)  TempSrc: Oral Oral Oral Oral  Resp: 27 19 16 16   Height:      Weight:      SpO2: 97% 96% 93% 95%    General: Pt is alert, follows commands appropriately, not in acute distress Cardiovascular: Regular rate and rhythm, S1/S2 +, no rubs, no gallops Respiratory: Clear to auscultation bilaterally, no wheezing, no crackles, no rhonchi Abdominal: Soft, non tender, non distended, bowel sounds +, no guarding   Discharge Instructions  Discharge Instructions    Diet - low sodium heart healthy     Complete by:  As directed      Increase activity slowly    Complete by:  As directed             Medication List    STOP taking these medications        acetaminophen 160 MG/5ML solution  Commonly known as:  TYLENOL  Replaced by:  acetaminophen 325 MG tablet     acetaminophen 650 MG suppository  Commonly known as:  TYLENOL     antiseptic oral rinse 0.05 % Liqd solution  Commonly known as:  CPC / CETYLPYRIDINIUM CHLORIDE 0.05%     chlorhexidine 0.12 % solution  Commonly known as:  PERIDEX     fluconazole 150 MG tablet  Commonly known as:  DIFLUCAN     furosemide 40 MG tablet  Commonly known as:  LASIX     hydrALAZINE 25 MG tablet  Commonly known as:  APRESOLINE     insulin glargine 100 UNIT/ML injection  Commonly known as:  LANTUS     ondansetron 4 MG/2ML Soln injection  Commonly known as:  ZOFRAN     potassium chloride 20 MEQ/15ML (10%) Soln     traMADol 50 MG tablet  Commonly known as:  ULTRAM      TAKE these medications        acetaminophen 325 MG tablet  Commonly known as:  TYLENOL  Take 2 tablets (  650 mg total) by mouth every 6 (six) hours as needed for fever.     carvedilol 25 MG tablet  Commonly known as:  COREG  Take 1 tablet (25 mg total) by mouth 2 (two) times daily.     diltiazem 90 MG tablet  Commonly known as:  CARDIZEM  Take 90 mg by mouth every 6 (six) hours. Midnight, 6am, noon, 6pm     doxycycline 100 MG tablet  Commonly known as:  VIBRA-TABS  Take 1 tablet (100 mg total) by mouth every 12 (twelve) hours. 7 day course started 09/14/15  Start taking on:  09/20/2015     ferrous sulfate 325 (65 FE) MG tablet  Take 325 mg by mouth 2 (two) times daily with a meal.     Fish Oil 1000 MG Caps  Take 4,000 mg by mouth daily.     GLUCERNA Liqd  Take 237 mLs by mouth 2 (two) times daily.     guaiFENesin 600 MG 12 hr tablet  Commonly known as:  MUCINEX  Take 1,200 mg by mouth 2 (two) times daily. 5 day course started 09/14/15     insulin  aspart 100 UNIT/ML injection  Commonly known as:  novoLOG  Inject 0-9 Units into the skin every 4 (four) hours.     ipratropium-albuterol 0.5-2.5 (3) MG/3ML Soln  Commonly known as:  DUONEB  Take 3 mLs by nebulization every 6 (six) hours as needed (for dyspnea or wheezing).     levothyroxine 150 MCG tablet  Commonly known as:  SYNTHROID, LEVOTHROID  Take 150 mcg by mouth daily before breakfast.     lisinopril 30 MG tablet  Commonly known as:  PRINIVIL,ZESTRIL  Take 30 mg by mouth daily.     multivitamin with minerals Tabs tablet  Take 1 tablet by mouth daily.     OVER THE COUNTER MEDICATION  Apply 1 application topically 3 (three) times daily. Antifungal cream - applied to vaginal and sacral area with brief changes every shift (3 times daily)     predniSONE 10 MG tablet  Commonly known as:  DELTASONE  Take 10 mg by mouth daily with breakfast.     sodium chloride flush 0.9 % Soln  Commonly known as:  NS  Inject 3 mLs into the vein every 12 (twelve) hours.     torsemide 10 MG tablet  Commonly known as:  DEMADEX  Take 10 mg by mouth daily.               Follow-up Information    Follow up with CAMPBELL,JAMES, MD.   Specialty:  Internal Medicine      Call Faye Ramsay, MD.   Specialty:  Internal Medicine   Why:  As needed call my cell [phone]   Contact information:   [address] [place] [address] [phone]        The results of significant diagnostics from this hospitalization (including imaging, microbiology, ancillary and laboratory) are listed below for reference.     Microbiology: Recent Results (from the past 240 hour(s))  Urine culture     Status: Abnormal   Collection Time: 09/14/15  2:16 PM  Result Value Ref Range Status   Specimen Description URINE, CATHETERIZED  Final   Special Requests NONE  Final   Culture >=100,000 COLONIES/mL MORGANELLA MORGANII (A)  Final   Report Status 09/16/2015 FINAL  Final    Organism ID, Bacteria MORGANELLA MORGANII (A)  Final      Susceptibility  Morganella morganii - MIC*    AMPICILLIN >=32 RESISTANT Resistant     CEFAZOLIN >=64 RESISTANT Resistant     CEFTRIAXONE <=1 SENSITIVE Sensitive     CIPROFLOXACIN <=0.25 SENSITIVE Sensitive     GENTAMICIN <=1 SENSITIVE Sensitive     IMIPENEM 1 SENSITIVE Sensitive     NITROFURANTOIN 128 RESISTANT Resistant     TRIMETH/SULFA <=20 SENSITIVE Sensitive     AMPICILLIN/SULBACTAM 16 INTERMEDIATE Intermediate     PIP/TAZO <=4 SENSITIVE Sensitive     * >=100,000 COLONIES/mL MORGANELLA MORGANII  Blood Culture (routine x 2)     Status: None (Preliminary result)   Collection Time: 09/14/15  4:48 PM  Result Value Ref Range Status   Specimen Description BLOOD LEFT WRIST  Final   Special Requests BOTTLES DRAWN AEROBIC AND ANAEROBIC 5CC  Final   Culture NO GROWTH 4 DAYS  Final   Report Status PENDING  Incomplete  Blood Culture (routine x 2)     Status: None (Preliminary result)   Collection Time: 09/14/15  5:46 PM  Result Value Ref Range Status   Specimen Description BLOOD RIGHT ANTECUBITAL  Final   Special Requests BOTTLES DRAWN AEROBIC AND ANAEROBIC 10CC  Final   Culture NO GROWTH 4 DAYS  Final   Report Status PENDING  Incomplete  MRSA PCR Screening     Status: None   Collection Time: 09/14/15  9:59 PM  Result Value Ref Range Status   MRSA by PCR NEGATIVE NEGATIVE Final  Culture, sputum-assessment     Status: None   Collection Time: 09/15/15  3:40 AM  Result Value Ref Range Status   Specimen Description SPUTUM  Final   Special Requests Immunocompromised  Final   Sputum evaluation   Final    THIS SPECIMEN IS ACCEPTABLE. RESPIRATORY CULTURE REPORT TO FOLLOW.   Report Status 09/15/2015 FINAL  Final  Culture, respiratory (NON-Expectorated)     Status: None   Collection Time: 09/15/15  3:40 AM  Result Value Ref Range Status   Specimen Description SPUTUM  Final   Special Requests NONE  Final   Gram Stain   Final     ABUNDANT WBC PRESENT, PREDOMINANTLY PMN RARE SQUAMOUS EPITHELIAL CELLS PRESENT FEW GRAM POSITIVE COCCI IN PAIRS RARE GRAM NEGATIVE RODS RARE GRAM NEGATIVE COCCI IN PAIRS RARE GRAM POSITIVE RODS    Culture Consistent with normal respiratory flora.  Final   Report Status 09/17/2015 FINAL  Final     Labs: Basic Metabolic Panel:  Recent Labs Lab 09/14/15 2111 09/15/15 0349 09/16/15 0421 09/17/15 0308 09/18/15 0400 09/19/15 0421  NA  --  132* 130* 130*  129* 132* 131*  K  --  4.0 3.6 3.9  3.8 4.1 4.2  CL  --  92* 101 99*  98* 101 101  CO2  --  23 22 23  22 25 24   GLUCOSE  --  148* 47* 60*  60* 114* 104*  BUN  --  22* 13 13  13 11 12   CREATININE  --  0.81 0.66 0.65  0.65 0.64 0.60  CALCIUM  --  9.7 8.5* 8.4*  8.4* 8.4* 8.3*  MG 1.7  --   --   --  1.8  --   PHOS 2.6  --   --  3.0  --   --    Liver Function Tests:  Recent Labs Lab 09/14/15 1644 09/15/15 0349 09/16/15 0421 09/17/15 0308  AST 18 17 14* 13*  ALT 16 16 14  12*  ALKPHOS 53  53 52 50  BILITOT 0.7 0.6 0.6 0.6  PROT 6.0* 5.8* 5.4* 5.1*  ALBUMIN 2.2* 2.0* 1.9* 1.8*  1.7*   CBC:  Recent Labs Lab 09/14/15 1644 09/15/15 0349 09/16/15 0421 09/17/15 0308 09/18/15 0400 09/19/15 0421  WBC 10.3 6.3 7.4 6.2 5.5 5.6  NEUTROABS 8.5* 5.0 6.2 4.3  --   --   HGB 7.5* 8.1* 8.1* 7.1* 7.2* 7.8*  HCT 21.1* 23.8* 24.5* 20.6* 21.0* 22.7*  MCV 90.2 87.5 87.2 87.3 85.7 86.6  PLT 236 198 214 224 213 254   BNP (last 3 results)  Recent Labs  06/24/15 0925 06/28/15 0719 09/14/15 2111  BNP 2557.8* 1048.5* 340.0*   CBG:  Recent Labs Lab 09/18/15 0755 09/18/15 1218 09/18/15 1618 09/18/15 2106 09/19/15 0823  GLUCAP 118* 172* 112* 189* 135*   SIGNED: Time coordinating discharge: 30 minutes  MAGICK-Tanara Turvey, MD  [facility] 09/19/2015, 9:45 AM Pager [phone]  If 7PM-7AM, please contact night-coverage [email or web address] Password [email or web address]

## 2015-09-19 NOTE — Care Management Note (Signed)
Case Management Note  Patient Details  Name: Emily Kennedy MRN: [id number] Date of Birth: 24-Sep-1945  Subjective/Objective:                 Pt admitted with HCAP, Tx to 5w within last 24 hours. Will DC to SNF this morning.   Action/Plan:   Expected Discharge Date:  09/17/15               Expected Discharge Plan:  Skilled Nursing Facility  In-House Referral:  Clinical Social Work  Discharge planning Services  CM Consult  Post Acute Care Choice:    Choice offered to:     DME Arranged:    DME Agency:     HH Arranged:    [place] Agency:     Status of Service:  Completed, signed off  If discussed at [employer], dates discussed:    Additional Comments:  Carles Collet, RN 09/19/2015, 11:02 AM

## 2015-09-19 NOTE — Discharge Instructions (Signed)

## 2015-09-20 LAB — CULTURE, BLOOD (ROUTINE X 2)
Culture: NO GROWTH
Culture: NO GROWTH

## 2015-12-29 ENCOUNTER — Other Ambulatory Visit: Payer: Self-pay | Admitting: Internal Medicine

## 2015-12-29 DIAGNOSIS — R609 Edema, unspecified: Secondary | ICD-10-CM

## 2016-01-05 ENCOUNTER — Ambulatory Visit
Admission: RE | Admit: 2016-01-05 | Discharge: 2016-01-05 | Disposition: A | Discharge status: Home / Self Care | Payer: Medicare Other | Source: Ambulatory Visit | Attending: Internal Medicine | Admitting: Internal Medicine

## 2016-01-05 DIAGNOSIS — R609 Edema, unspecified: Secondary | ICD-10-CM

## 2016-02-02 ENCOUNTER — Encounter: Payer: Self-pay | Admitting: Podiatry

## 2016-02-02 ENCOUNTER — Ambulatory Visit (INDEPENDENT_AMBULATORY_CARE_PROVIDER_SITE_OTHER): Payer: Medicare Other | Admitting: Podiatry

## 2016-02-02 VITALS — BP 162/89 | HR 78

## 2016-02-02 DIAGNOSIS — M779 Enthesopathy, unspecified: Secondary | ICD-10-CM

## 2016-02-02 DIAGNOSIS — M79672 Pain in left foot: Secondary | ICD-10-CM | POA: Diagnosis not present

## 2016-02-02 DIAGNOSIS — R600 Localized edema: Secondary | ICD-10-CM | POA: Diagnosis not present

## 2016-02-02 DIAGNOSIS — M79671 Pain in right foot: Secondary | ICD-10-CM | POA: Diagnosis not present

## 2016-02-02 NOTE — Progress Notes (Signed)
Subjective:     Patient ID: Emily Kennedy, female   DOB: 1945-05-03, 70 y.o.   MRN: [id number]  HPI patient presents with caregiver with swelling of the feet and legs bilateral with history of heart issues and diabetes   Review of Systems  All other systems reviewed and are negative.      Objective:   Physical Exam  Constitutional: She is oriented to person, place, and time.  Cardiovascular: Intact distal pulses.   Musculoskeletal: She exhibits edema.  Neurological: She is alert and oriented to person, place, and time.  Skin: Skin is warm and dry.  Nursing note and vitals reviewed.  Patient presents with chronic swelling of the feet and lower legs of both feet. States that she's tried hose in the past that she was not able to tolerate and she is also treated by a physician for this problem with medication. 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 At this time I explained all this to her and caregiver Lilia Pro to try to switch her hose to light or compression and I center to the surgical supply store to pick these up. I did discuss elevation and if any breakdown of tissue patient is to reappoint immediately

## 2016-02-02 NOTE — Progress Notes (Signed)
   Subjective:    Patient ID: Emily Kennedy, female    DOB: 1946/02/28, 70 y.o.   MRN: [id number]  HPI    Review of Systems  Cardiovascular: Positive for leg swelling.  Musculoskeletal: Positive for gait problem.  Psychiatric/Behavioral: Positive for confusion.  All other systems reviewed and are negative.      Objective:   Physical Exam        Assessment & Plan:

## 2016-02-14 ENCOUNTER — Emergency Department (HOSPITAL_COMMUNITY): Payer: Medicare Other

## 2016-02-14 ENCOUNTER — Encounter (HOSPITAL_COMMUNITY): Payer: Self-pay | Admitting: *Deleted

## 2016-02-14 ENCOUNTER — Inpatient Hospital Stay (HOSPITAL_COMMUNITY)
Admission: EM | Admit: 2016-02-14 | Discharge: 2016-02-16 | DRG: 292 | Disposition: A | Discharge status: Home / Self Care | Payer: Medicare Other | Attending: Internal Medicine | Admitting: Internal Medicine

## 2016-02-14 ENCOUNTER — Other Ambulatory Visit: Payer: Self-pay

## 2016-02-14 DIAGNOSIS — Z888 Allergy status to other drugs, medicaments and biological substances status: Secondary | ICD-10-CM | POA: Diagnosis not present

## 2016-02-14 DIAGNOSIS — Z7952 Long term (current) use of systemic steroids: Secondary | ICD-10-CM | POA: Diagnosis not present

## 2016-02-14 DIAGNOSIS — IMO0002 Reserved for concepts with insufficient information to code with codable children: Secondary | ICD-10-CM | POA: Diagnosis present

## 2016-02-14 DIAGNOSIS — Z882 Allergy status to sulfonamides status: Secondary | ICD-10-CM

## 2016-02-14 DIAGNOSIS — Z85820 Personal history of malignant melanoma of skin: Secondary | ICD-10-CM | POA: Diagnosis not present

## 2016-02-14 DIAGNOSIS — I509 Heart failure, unspecified: Secondary | ICD-10-CM

## 2016-02-14 DIAGNOSIS — D638 Anemia in other chronic diseases classified elsewhere: Secondary | ICD-10-CM | POA: Diagnosis present

## 2016-02-14 DIAGNOSIS — R911 Solitary pulmonary nodule: Secondary | ICD-10-CM | POA: Diagnosis present

## 2016-02-14 DIAGNOSIS — Z881 Allergy status to other antibiotic agents status: Secondary | ICD-10-CM

## 2016-02-14 DIAGNOSIS — Z79899 Other long term (current) drug therapy: Secondary | ICD-10-CM

## 2016-02-14 DIAGNOSIS — I776 Arteritis, unspecified: Secondary | ICD-10-CM | POA: Diagnosis present

## 2016-02-14 DIAGNOSIS — I110 Hypertensive heart disease with heart failure: Principal | ICD-10-CM | POA: Diagnosis present

## 2016-02-14 DIAGNOSIS — E1165 Type 2 diabetes mellitus with hyperglycemia: Secondary | ICD-10-CM | POA: Diagnosis present

## 2016-02-14 DIAGNOSIS — Z794 Long term (current) use of insulin: Secondary | ICD-10-CM

## 2016-02-14 DIAGNOSIS — I5033 Acute on chronic diastolic (congestive) heart failure: Secondary | ICD-10-CM

## 2016-02-14 DIAGNOSIS — I5032 Chronic diastolic (congestive) heart failure: Secondary | ICD-10-CM | POA: Diagnosis present

## 2016-02-14 DIAGNOSIS — E440 Moderate protein-calorie malnutrition: Secondary | ICD-10-CM | POA: Diagnosis present

## 2016-02-14 DIAGNOSIS — Z833 Family history of diabetes mellitus: Secondary | ICD-10-CM

## 2016-02-14 DIAGNOSIS — E039 Hypothyroidism, unspecified: Secondary | ICD-10-CM | POA: Diagnosis present

## 2016-02-14 DIAGNOSIS — Z6823 Body mass index (BMI) 23.0-23.9, adult: Secondary | ICD-10-CM

## 2016-02-14 DIAGNOSIS — Z809 Family history of malignant neoplasm, unspecified: Secondary | ICD-10-CM

## 2016-02-14 DIAGNOSIS — I10 Essential (primary) hypertension: Secondary | ICD-10-CM | POA: Diagnosis present

## 2016-02-14 HISTORY — DX: Heart failure, unspecified: I50.9

## 2016-02-14 LAB — BASIC METABOLIC PANEL
Anion gap: 8 (ref 5–15)
BUN: 30 mg/dL — AB (ref 6–20)
CALCIUM: 9.6 mg/dL (ref 8.9–10.3)
CHLORIDE: 104 mmol/L (ref 101–111)
CO2: 23 mmol/L (ref 22–32)
CREATININE: 0.96 mg/dL (ref 0.44–1.00)
GFR calc non Af Amer: 59 mL/min — ABNORMAL LOW (ref >60)
Glucose, Bld: 252 mg/dL — ABNORMAL HIGH (ref 65–99)
Potassium: 4.4 mmol/L (ref 3.5–5.1)
SODIUM: 135 mmol/L (ref 135–145)

## 2016-02-14 LAB — CBC
HEMATOCRIT: 31.9 % — AB (ref 36.0–46.0)
Hemoglobin: 10.2 g/dL — ABNORMAL LOW (ref 12.0–15.0)
MCH: 25.9 pg — ABNORMAL LOW (ref 26.0–34.0)
MCHC: 32 g/dL (ref 30.0–36.0)
MCV: 81 fL (ref 78.0–100.0)
Platelets: 181 10*3/uL (ref 150–400)
RBC: 3.94 MIL/uL (ref 3.87–5.11)
RDW: 14.2 % (ref 11.5–15.5)
WBC: 5.1 10*3/uL (ref 4.0–10.5)

## 2016-02-14 LAB — I-STAT TROPONIN, ED: Troponin i, poc: 0 ng/mL (ref 0.00–0.08)

## 2016-02-14 LAB — TROPONIN I: Troponin I: <0.03 ng/mL (ref <0.03)

## 2016-02-14 LAB — BRAIN NATRIURETIC PEPTIDE: B NATRIURETIC PEPTIDE 5: 641.8 pg/mL — AB (ref 0.0–100.0)

## 2016-02-14 LAB — GLUCOSE, CAPILLARY: Glucose-Capillary: 259 mg/dL — ABNORMAL HIGH (ref 65–99)

## 2016-02-14 MED ORDER — CARVEDILOL 25 MG PO TABS
25.0000 mg | ORAL_TABLET | Freq: Two times a day (BID) | ORAL | Status: DC
  Administered 2016-02-14 – 2016-02-16 (×4): 25 mg via ORAL
  Filled 2016-02-14 (×4): qty 1

## 2016-02-14 MED ORDER — HYDRALAZINE HCL 50 MG PO TABS
25.0000 mg | ORAL_TABLET | Freq: Three times a day (TID) | ORAL | Status: DC
  Administered 2016-02-14: 25 mg via ORAL
  Filled 2016-02-14: qty 1

## 2016-02-14 MED ORDER — FERROUS SULFATE 325 (65 FE) MG PO TABS: 325.0000 mg | ORAL_TABLET | Freq: Two times a day (BID) | ORAL | Status: DC

## 2016-02-14 MED ORDER — SODIUM CHLORIDE 0.9% FLUSH
3.0000 mL | Freq: Two times a day (BID) | INTRAVENOUS | Status: DC
  Administered 2016-02-14 – 2016-02-15 (×3): 3 mL via INTRAVENOUS

## 2016-02-14 MED ORDER — GUAIFENESIN ER 600 MG PO TB12: 1200.0000 mg | ORAL_TABLET | Freq: Two times a day (BID) | ORAL | Status: DC

## 2016-02-14 MED ORDER — DILTIAZEM HCL 60 MG PO TABS
90.0000 mg | ORAL_TABLET | Freq: Four times a day (QID) | ORAL | Status: DC
  Filled 2016-02-14: qty 1

## 2016-02-14 MED ORDER — ONDANSETRON HCL 4 MG/2ML IJ SOLN: 4.0000 mg | Freq: Four times a day (QID) | INTRAMUSCULAR | Status: DC | PRN

## 2016-02-14 MED ORDER — ENOXAPARIN SODIUM 40 MG/0.4ML ~~LOC~~ SOLN
40.0000 mg | SUBCUTANEOUS | Status: DC
  Administered 2016-02-14 – 2016-02-15 (×2): 40 mg via SUBCUTANEOUS
  Filled 2016-02-14 (×2): qty 0.4

## 2016-02-14 MED ORDER — DILTIAZEM HCL 60 MG PO TABS
90.0000 mg | ORAL_TABLET | Freq: Four times a day (QID) | ORAL | Status: DC
  Administered 2016-02-14 – 2016-02-16 (×6): 90 mg via ORAL
  Filled 2016-02-14 (×7): qty 1

## 2016-02-14 MED ORDER — SODIUM CHLORIDE 0.9% FLUSH: 3.0000 mL | INTRAVENOUS | Status: DC | PRN

## 2016-02-14 MED ORDER — GABAPENTIN 100 MG PO CAPS
100.0000 mg | ORAL_CAPSULE | Freq: Every day | ORAL | Status: DC
  Administered 2016-02-14 – 2016-02-16 (×3): 100 mg via ORAL
  Filled 2016-02-14 (×3): qty 1

## 2016-02-14 MED ORDER — DILTIAZEM HCL ER COATED BEADS 120 MG PO TB24: 120.0000 mg | ORAL_TABLET | Freq: Every day | ORAL | Status: DC

## 2016-02-14 MED ORDER — LEVOTHYROXINE SODIUM 75 MCG PO TABS
150.0000 ug | ORAL_TABLET | Freq: Every day | ORAL | Status: DC
  Administered 2016-02-15 – 2016-02-16 (×2): 150 ug via ORAL
  Filled 2016-02-14 (×2): qty 2

## 2016-02-14 MED ORDER — ADULT MULTIVITAMIN W/MINERALS CH
1.0000 | ORAL_TABLET | Freq: Every day | ORAL | Status: DC
  Administered 2016-02-15 – 2016-02-16 (×2): 1 via ORAL
  Filled 2016-02-14 (×3): qty 1

## 2016-02-14 MED ORDER — SODIUM CHLORIDE 0.9 % IV SOLN: 250.0000 mL | INTRAVENOUS | Status: DC | PRN

## 2016-02-14 MED ORDER — ACETAMINOPHEN 325 MG PO TABS: 650.0000 mg | ORAL_TABLET | Freq: Four times a day (QID) | ORAL | Status: DC | PRN

## 2016-02-14 MED ORDER — PREDNISONE 10 MG PO TABS: 10.0000 mg | ORAL_TABLET | Freq: Every day | ORAL | Status: DC

## 2016-02-14 MED ORDER — FUROSEMIDE 10 MG/ML IJ SOLN
40.0000 mg | Freq: Two times a day (BID) | INTRAMUSCULAR | Status: DC
  Administered 2016-02-14 – 2016-02-16 (×4): 40 mg via INTRAVENOUS
  Filled 2016-02-14 (×4): qty 4

## 2016-02-14 MED ORDER — FERROUS SULFATE 325 (65 FE) MG PO TABS
325.0000 mg | ORAL_TABLET | Freq: Two times a day (BID) | ORAL | Status: DC
  Administered 2016-02-15 – 2016-02-16 (×3): 325 mg via ORAL
  Filled 2016-02-14 (×3): qty 1

## 2016-02-14 MED ORDER — MAGNESIUM OXIDE 400 (241.3 MG) MG PO TABS
400.0000 mg | ORAL_TABLET | Freq: Every day | ORAL | Status: DC
  Administered 2016-02-15 – 2016-02-16 (×2): 400 mg via ORAL
  Filled 2016-02-14 (×2): qty 1

## 2016-02-14 NOTE — ED Provider Notes (Signed)
  Physical Exam  BP 147/71   Pulse 74   Temp 98.3 F (36.8 C) (Oral)   Resp 18   Wt 61.3 kg   SpO2 96%   BMI 23.18 kg/m   Physical Exam  ED Course  Procedures  MDMPatient's coming with cough congestion shortness of breath, possible setting of a URI, pneumonia, pulmonary edema. She's able to ambulate without desaturation or shortness of breath here. Chest x-ray shows some mild bilateral edema possible underlying pneumonia, will get a CT to rule out. Screening labs for heart failure are ordered.  Patient has worsening kidney function, increasing creatinine from 0.60 to 0.9. CT shows bilateral pleural effusions and similar size pericardial effusion. She will need admission for further management of fluid overload, as well as possible repeat echo. Further maintenance patient's care please see inpatient team notes. Vital signs stable time handoff care.        Dewaine Conger, MD 02/14/16 1846    Merrily Pew, MD 02/15/16 [phone]

## 2016-02-14 NOTE — ED Provider Notes (Signed)
Medical screening examination/treatment/procedure(s) were conducted as a shared visit with non-physician practitioner(s) and myself.  I personally evaluated the patient during the encounter.   EKG Interpretation  Date/Time:  Saturday February 14 2016 13:27:13 EST Ventricular Rate:  68 PR Interval:  176 QRS Duration: 86 QT Interval:  432 QTC Calculation: 459 R Axis:   103 Text Interpretation:  Normal sinus rhythm Rightward axis Borderline ECG Confirmed by Vincenzina Jagoda  MD, Jacinda Kanady [id number]) on 02/14/2016 3:17:06 PM      Results for orders placed or performed during the hospital encounter of [id number]  Basic metabolic panel  Result Value Ref Range   Sodium 135 135 - 145 mmol/L   Potassium 4.4 3.5 - 5.1 mmol/L   Chloride 104 101 - 111 mmol/L   CO2 23 22 - 32 mmol/L   Glucose, Bld 252 (H) 65 - 99 mg/dL   BUN 30 (H) 6 - 20 mg/dL   Creatinine, Ser 0.96 0.44 - 1.00 mg/dL   Calcium 9.6 8.9 - 10.3 mg/dL   GFR calc non Af Amer 59 (L) >60 mL/min   GFR calc Af Amer >60 >60 mL/min   Anion gap 8 5 - 15  CBC  Result Value Ref Range   WBC 5.1 4.0 - 10.5 K/uL   RBC 3.94 3.87 - 5.11 MIL/uL   Hemoglobin 10.2 (L) 12.0 - 15.0 g/dL   HCT 31.9 (L) 36.0 - 46.0 %   MCV 81.0 78.0 - 100.0 fL   MCH 25.9 (L) 26.0 - 34.0 pg   MCHC 32.0 30.0 - 36.0 g/dL   RDW 14.2 11.5 - 15.5 %   Platelets 181 150 - 400 K/uL  I-stat troponin, ED  Result Value Ref Range   Troponin i, poc 0.00 0.00 - 0.08 ng/mL   Comment 3           Dg Chest 2 View  Result Date: 02/14/2016 CLINICAL DATA:  Shortness of breath and chest pain. EXAM: CHEST  2 VIEW COMPARISON:  September 14, 2015 FINDINGS: No pneumothorax. The heart size is borderline. The hila are unremarkable. Small effusions with underlying opacities. Mild increased interstitial markings. No other changes. IMPRESSION: Small bilateral effusions and underlying atelectasis. Mild increased interstitial prominence may represent mild edema. Recommend follow-up to resolution. Electronically  Signed   By: Dorise Bullion III M.D   On: 02/14/2016 14:35   Patient seen by me along with physician assistant. Patient 70 year old female with a several day history of an upper respiratory infection with cough that is nonproductive. Patient's had some worsening shortness of breath past 2 days. Not able to sleep laying down. Patient's past medical history mentions congestive heart failure. However patient is not aware of that. On exam she does have bilateral leg swelling with some pitting edema. Patient felt as if she needed oxygen although her oxygen sats were never below 90% on room air. Patient satting okay off of oxygen. No wheezing on lung exam. Heart regular not tachycardic. No hypotension. Maybe a little bit of hypertension. No fevers. Chest x-ray raises concerns perhaps for some early edema and perhaps the evidence of small bilateral pleural effusions. Because of the patient's leg swelling and because of her air hungry. We'll go and do CT of chest without contrast to further evaluate the lungs going get a BNP. Patient's troponin was negative. Her EKG had no acute findings. It was a normal sinus rhythm.  If these are negative and don't have findings that warrant admission then patient needs to be ambulated make  sure she does not desat.  In addition her abdomen is soft and nontender.   Fredia Sorrow, MD 02/14/16 1538

## 2016-02-14 NOTE — ED Notes (Signed)
Pleas Contact with any changes [name] (Son): [phone]  [name] (daughter): [phone]

## 2016-02-14 NOTE — H&P (Signed)
History and Physical    Emily Kennedy [id number] DOB: 1945/07/26 DOA: 02/14/2016  Referring MD/NP/PA: EDP PCP: Pcp Not In System  Patient coming from: Home  Chief Complaint: shortness of breath  HPI: Emily Kennedy is a 70 y.o. 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 Pt reports progressive dyspnea with exertion for 2-3days worsened today, reports leg swelling, Orthopnea and dry cough. Denies chest pain, no fevers or chills  ED Course: BNP 641, CTA chest with bilateral effusions  Review of Systems: As per HPI otherwise 10 point review of systems negative.   Past Medical History:  Diagnosis Date  . CHF (congestive heart failure) ([place])   . Diabetes mellitus   . Hypertension   . Thyroid disease   . Vasculitis [facility])     Past Surgical History:  Procedure Laterality Date  . MELANOMA EXCISION    . TUBAL LIGATION       reports that she has never smoked. She has never used smokeless tobacco. She reports that she does not drink alcohol or use drugs.  Allergies  Allergen Reactions  . Benadryl [Diphenhydramine] Swelling  . Ciprofloxacin Hives  . Furosemide Itching, Swelling, Rash and Other (See Comments)    Made edema worse  . Nitrofurantoin Monohyd Macro Hives  . Septra [Bactrim] Hives  . Sulfa Drugs Cross Reactors Hives  . Ceclor [Cefaclor] Rash    Breaks out in rash chest down.    Family History  Problem Relation Age of Onset  . Cancer Sister   . Diabetes Mellitus II Mother   . Tuberculosis Father     Prior to Admission medications   Medication Sig Start Date End Date Taking? Authorizing Provider  carvedilol (COREG) 25 MG tablet Take 1 tablet (25 mg total) by mouth 2 (two) times daily. 09/19/15  Yes Theodis Blaze, MD  cholecalciferol (VITAMIN D) 400 units TABS tablet Take 400 Units by mouth daily.   Yes Historical Provider, MD    diltiazem (CARDIZEM LA) 120 MG 24 hr tablet Take 120 mg by mouth daily.   Yes Historical Provider, MD  diltiazem (CARDIZEM) 90 MG tablet Take 90 mg by mouth every 6 (six) hours. Midnight, 6am, noon, 6pm   Yes Historical Provider, MD  ferrous sulfate 325 (65 FE) MG tablet Take 325 mg by mouth 2 (two) times daily with a meal.   Yes Historical Provider, MD  gabapentin (NEURONTIN) 100 MG capsule Take 100 mg by mouth daily.   Yes Historical Provider, MD  GLUCERNA (GLUCERNA) LIQD Take 237 mLs by mouth 2 (two) times daily.   Yes Historical Provider, MD  glucosamine-chondroitin 500-400 MG tablet Take 1 tablet by mouth 2 (two) times daily.   Yes Historical Provider, MD  hydrALAZINE (APRESOLINE) 100 MG tablet Take 100 mg by mouth 3 (three) times daily.   Yes Historical Provider, MD  insulin aspart (NOVOLOG) 100 UNIT/ML injection Inject 0-9 Units into the skin every 4 (four) hours. Patient taking differently: Inject 0-10 Units into the skin 4 (four) times daily. Inject 0-10 units subcutaneously at 6:30am, 11am, 4pm, 9pm per sliding scale: CBG 0-200 0 units, 201-250 2 units, 251-300 4 units, 301-350 6 units, 351-400 8 units, >400 10 units and call MD 06/23/15  Yes Cherene Altes, MD  levothyroxine (SYNTHROID, LEVOTHROID) 150 MCG tablet Take 150 mcg by mouth daily before breakfast.   Yes Historical Provider, MD  lisinopril (PRINIVIL,ZESTRIL)  30 MG tablet Take 30 mg by mouth daily.   Yes Historical Provider, MD  magnesium oxide (MAG-OX) 400 MG tablet Take 400 mg by mouth daily.   Yes Historical Provider, MD  Multiple Vitamin (MULTIVITAMIN WITH MINERALS) TABS tablet Take 1 tablet by mouth daily.   Yes Historical Provider, MD  Multiple Vitamin (MULTIVITAMIN) tablet Take 1 tablet by mouth daily.   Yes Historical Provider, MD  Omega-3 Fatty Acids (FISH OIL) 1000 MG CAPS Take 4,000 mg by mouth daily.   Yes Historical Provider, MD  OVER THE COUNTER MEDICATION Apply 1 application topically 3 (three) times daily.  Antifungal cream - applied to vaginal and sacral area with brief changes every shift (3 times daily)   Yes Historical Provider, MD  torsemide (DEMADEX) 10 MG tablet Take 10 mg by mouth daily.   Yes Historical Provider, MD  vitamin B-12 (CYANOCOBALAMIN) 500 MCG tablet Take 500 mcg by mouth daily.   Yes Historical Provider, MD  acetaminophen (TYLENOL) 325 MG tablet Take 2 tablets (650 mg total) by mouth every 6 (six) hours as needed for fever. Patient not taking: Reported on 02/14/2016 09/19/15   Theodis Blaze, MD  guaiFENesin (MUCINEX) 600 MG 12 hr tablet Take 1,200 mg by mouth 2 (two) times daily. 5 day course started 09/14/15    Historical Provider, MD  ipratropium-albuterol (DUONEB) 0.5-2.5 (3) MG/3ML SOLN Take 3 mLs by nebulization every 6 (six) hours as needed (for dyspnea or wheezing). Patient not taking: Reported on 02/14/2016 09/19/15   Theodis Blaze, MD  predniSONE (DELTASONE) 10 MG tablet Take 10 mg by mouth daily with breakfast.    Historical Provider, MD  sodium chloride flush (NS) 0.9 % SOLN Inject 3 mLs into the vein every 12 (twelve) hours. Patient not taking: Reported on 02/14/2016 06/23/15   Cherene Altes, MD    Physical Exam: Vitals:   02/14/16 1449 02/14/16 1553 02/14/16 1600 02/14/16 1645  BP: 160/72 147/71 155/76 157/73  Pulse: 73 74 69 68  Resp: 19 18 23 15   Temp:      TempSrc:      SpO2: 98% 96% 97% 99%  Weight:          Constitutional: NAD, calm, comfortable, AAOx3, no distress Vitals:   02/14/16 1449 02/14/16 1553 02/14/16 1600 02/14/16 1645  BP: 160/72 147/71 155/76 157/73  Pulse: 73 74 69 68  Resp: 19 18 23 15   Temp:      TempSrc:      SpO2: 98% 96% 97% 99%  Weight:       Eyes: PERRL, lids and conjunctivae normal ENMT: Mucous membranes are moist.poor dentitio Neck: JVD noted Respiratory: crackles at both bases Cardiovascular: Regular rate and rhythm, no murmurs / rubs / gallops.   Abdomen: no tenderness, no masses palpated. No hepatosplenomegaly. Bowel  sounds positive.  Musculoskeletal: 2plus edema Skin: no rashes, lesions, ulcers. No induration Neurologic: CN 2-12 grossly intact. Sensation intact, DTR normal. Strength 5/5 in all 4.  Psychiatric: Normal judgment and insight. Alert and oriented x 3. Normal mood.   Labs on Admission: I have personally reviewed following labs and imaging studies  CBC:  Recent Labs Lab 02/14/16 1340  WBC 5.1  HGB 10.2*  HCT 31.9*  MCV 81.0  PLT 0000000   Basic Metabolic Panel:  Recent Labs Lab 02/14/16 1340  NA 135  K 4.4  CL 104  CO2 23  GLUCOSE 252*  BUN 30*  CREATININE 0.96  CALCIUM 9.6   GFR: Estimated Creatinine  Clearance: 47.8 mL/min (by C-G formula based on SCr of 0.96 mg/dL). Liver Function Tests: No results for input(s): AST, ALT, ALKPHOS, BILITOT, PROT, ALBUMIN in the last 168 hours. No results for input(s): LIPASE, AMYLASE in the last 168 hours. No results for input(s): AMMONIA in the last 168 hours. Coagulation Profile: No results for input(s): INR, PROTIME in the last 168 hours. Cardiac Enzymes: No results for input(s): CKTOTAL, CKMB, CKMBINDEX, TROPONINI in the last 168 hours. BNP (last 3 results) No results for input(s): PROBNP in the last 8760 hours. HbA1C: No results for input(s): HGBA1C in the last 72 hours. CBG: No results for input(s): GLUCAP in the last 168 hours. Lipid Profile: No results for input(s): CHOL, HDL, LDLCALC, TRIG, CHOLHDL, LDLDIRECT in the last 72 hours. Thyroid Function Tests: No results for input(s): TSH, T4TOTAL, FREET4, T3FREE, THYROIDAB in the last 72 hours. Anemia Panel: No results for input(s): VITAMINB12, FOLATE, FERRITIN, TIBC, IRON, RETICCTPCT in the last 72 hours. Urine analysis:    Component Value Date/Time   COLORURINE YELLOW 09/14/2015 1416   APPEARANCEUR TURBID (A) 09/14/2015 1416   LABSPEC 1.014 09/14/2015 1416   PHURINE 6.5 09/14/2015 1416   GLUCOSEU NEGATIVE 09/14/2015 1416   HGBUR LARGE (A) 09/14/2015 1416   BILIRUBINUR  NEGATIVE 09/14/2015 1416   KETONESUR NEGATIVE 09/14/2015 1416   PROTEINUR 100 (A) 09/14/2015 1416   UROBILINOGEN 1.0 01/06/2011 2252   NITRITE NEGATIVE 09/14/2015 1416   LEUKOCYTESUR LARGE (A) 09/14/2015 1416   Sepsis Labs: @LABRCNTIP (procalcitonin:4,lacticidven:4) )No results found for this or any previous visit (from the past 240 hour(s)).   Radiological Exams on Admission: Dg Chest 2 View  Result Date: 02/14/2016 CLINICAL DATA:  Shortness of breath and chest pain. EXAM: CHEST  2 VIEW COMPARISON:  September 14, 2015 FINDINGS: No pneumothorax. The heart size is borderline. The hila are unremarkable. Small effusions with underlying opacities. Mild increased interstitial markings. No other changes. IMPRESSION: Small bilateral effusions and underlying atelectasis. Mild increased interstitial prominence may represent mild edema. Recommend follow-up to resolution. Electronically Signed   By: Dorise Bullion III M.D   On: 02/14/2016 14:35   Ct Chest Wo Contrast  Result Date: 02/14/2016 CLINICAL DATA:  Nonproductive cough and progressive shortness of breath for several days. Congestive heart failure. EXAM: CT CHEST WITHOUT CONTRAST TECHNIQUE: Multidetector CT imaging of the chest was performed following the standard protocol without IV contrast. COMPARISON:  Chest radiograph on 02/14/2016 FINDINGS: Cardiovascular: Heart size is within normal limits. Small pericardial effusion seen. Aortic atherosclerosis. Coronary artery calcification also noted. Mediastinum/Nodes: No masses or pathologically enlarged lymph nodes identified on this unenhanced exam. Lungs/Pleura: Small pleural effusions seen bilaterally. Mild compressive atelectasis seen in both lower lobes. Mild scarring seen bilaterally, with associated mild bronchiectasis in the medial right upper lobe and anterior right lower lobe. No evidence of pulmonary consolidation or mass. 3 mm noncalcified pulmonary nodule seen in the posterior lateral right upper  lobe on image 53/205. Upper Abdomen: Probable splenomegaly noted although spleen is incompletely visualized on this exam. Musculoskeletal: No suspicious bone lesions or other significant abnormality. IMPRESSION: Small bilateral pleural effusions and bibasilar atelectasis. No evidence of pulmonary consolidation or mass. Small pericardial effusion. Mild right upper and lower lobe scarring with associated mild traction bronchiectasis. 3 mm indeterminate right upper lobe pulmonary nodule. No follow-up needed if patient is low-risk. Non-contrast chest CT can be considered in 12 months if patient is high-risk. This recommendation follows the consensus statement: Guidelines for Management of Incidental Pulmonary Nodules Detected on CT Images:  From the Fleischner Society 2017; Radiology 2017; [phone]. Suspect splenomegaly, although spleen is incompletely visualized on this exam. Electronically Signed   By: Earle Gell M.D.   On: 02/14/2016 16:45    EKG: Independently reviewed. NSR, no acute ST T wave changes  Assessment/Plan     Acute on chronic diastolic CHF -admit to tele -Iv lasix 40mg  Q12, h/o allergy but reaction per patient was itching and made swelling worse which are not true allergies -monitor I/Os -weights -ECHo 3/17 with normal EF and Grade 2 Diastolic dysfunction -torsemide on hold    Diabetes mellitus type 2, uncontrolled ([place]) -on SSI at home -keep on SSI now    Anemia, chronic disease -stable    Hypothyroidism -continue synthroid    Essential hypertension -stable, hold ACE, cut down hydralazine dose to allow more room for diuretics   H/o vasculitis -stable, on prednisone 10mg  continued, clinically do not think she needs stress dose at this time  Pulm nodule -noted on CTA chest, needs FU  DVT prophylaxis:lovenox Code Status: Full Code Family Communication: Son at bedside Disposition Plan: Home when improved, ?2days Admission status: inpatient, CHF exacerbation needing  IV diuretics, tele monitoring, electrolyte monitoring  Domenic Polite MD [facility] Pager [phone]  If 7PM-7AM, please contact night-coverage [email or web address] Password [facility]  02/14/2016, 5:51 PM

## 2016-02-14 NOTE — ED Notes (Signed)
Pt ambulated in hallway with 1 stand-by assist and use of walker; pt tolerated well; oxygen saturation fluctuated between 96% and 99% on room air

## 2016-02-14 NOTE — ED Provider Notes (Signed)
[facility] DEPT Provider Note   CSN: [id number] Arrival date & time: 02/14/16  1259     History   Chief Complaint Chief Complaint  Patient presents with  . Shortness of Breath    HPI Emily Kennedy is a 70 y.o. female history of CHF and pneumonia presenting with 3 days of worsening nonproductive cough and shortness of breath. She reports having congestion and a fever at home to 100 deg. F. She reports not being able to sleep lying down. She reports nothing makes it better or worse. Patient denies any recent contacts. Patient denies chest pain, nausea, vomiting, or diarrhea.   HPI  Past Medical History:  Diagnosis Date  . CHF (congestive heart failure) ([place])   . Diabetes mellitus   . Hypertension   . Thyroid disease   . Vasculitis [facility])     Patient Active Problem List   Diagnosis Date Noted  . Malnutrition of moderate degree 09/18/2015  . Benign essential HTN   . Vasculitis ([place])   . Rheumatoid arthritis ([place])   . History of recent hospitalization   . Acute blood loss anemia   . Anemia of chronic disease   . Hyponatremia 09/15/2015  . HCAP (healthcare-associated pneumonia) 09/14/2015  . Respiratory arrest ([place]) intubated 4/13 post arrest 06/27/2015  . Cardiac arrest ([place]) 4/13 suspected mucus plugging as cause of arrest 06/27/2015  . Controlled diabetes mellitus type 2 with complications ([place])   . Dysphagia   . Atrial fibrillation with RVR ([place]) 06/18/2015  . Acute on chronic diastolic CHF (congestive heart failure), NYHA class 3 ([place])   . Acute encephalopathy 06/14/2015  . Anemia, chronic disease 05/18/2015  . Hypothyroidism 05/18/2015  . Essential hypertension 05/18/2015  . Diabetes mellitus type 2, uncontrolled ([place]) 04/09/2015  . 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 MELANOMA EXCISION    . TUBAL LIGATION      OB History    No data available       Home  Medications    Prior to Admission medications   Medication Sig Start Date End Date Taking? Authorizing Provider  carvedilol (COREG) 25 MG tablet Take 1 tablet (25 mg total) by mouth 2 (two) times daily. 09/19/15  Yes Theodis Blaze, MD  cholecalciferol (VITAMIN D) 400 units TABS tablet Take 400 Units by mouth daily.   Yes Historical Provider, MD  diltiazem (CARDIZEM LA) 120 MG 24 hr tablet Take 120 mg by mouth daily.   Yes Historical Provider, MD  diltiazem (CARDIZEM) 90 MG tablet Take 90 mg by mouth every 6 (six) hours. Midnight, 6am, noon, 6pm   Yes Historical Provider, MD  ferrous sulfate 325 (65 FE) MG tablet Take 325 mg by mouth 2 (two) times daily with a meal.   Yes Historical Provider, MD  gabapentin (NEURONTIN) 100 MG capsule Take 100 mg by mouth daily.   Yes Historical Provider, MD  GLUCERNA (GLUCERNA) LIQD Take 237 mLs by mouth 2 (two) times daily.   Yes Historical Provider, MD  glucosamine-chondroitin 500-400 MG tablet Take 1 tablet by mouth 2 (two) times daily.   Yes Historical Provider, MD  hydrALAZINE (APRESOLINE) 100 MG tablet Take 100 mg by mouth 3 (three) times daily.   Yes Historical Provider, MD  insulin aspart (NOVOLOG) 100 UNIT/ML injection Inject 0-9 Units into the skin every 4 (four) hours. Patient taking differently: Inject 0-10 Units into the skin 4 (four) times daily. Inject 0-10 units subcutaneously  at 6:30am, 11am, 4pm, 9pm per sliding scale: CBG 0-200 0 units, 201-250 2 units, 251-300 4 units, 301-350 6 units, 351-400 8 units, >400 10 units and call MD 06/23/15  Yes Cherene Altes, MD  levothyroxine (SYNTHROID, LEVOTHROID) 150 MCG tablet Take 150 mcg by mouth daily before breakfast.   Yes Historical Provider, MD  lisinopril (PRINIVIL,ZESTRIL) 30 MG tablet Take 30 mg by mouth daily.   Yes Historical Provider, MD  magnesium oxide (MAG-OX) 400 MG tablet Take 400 mg by mouth daily.   Yes Historical Provider, MD  Multiple Vitamin (MULTIVITAMIN WITH MINERALS) TABS tablet Take  1 tablet by mouth daily.   Yes Historical Provider, MD  Multiple Vitamin (MULTIVITAMIN) tablet Take 1 tablet by mouth daily.   Yes Historical Provider, MD  Omega-3 Fatty Acids (FISH OIL) 1000 MG CAPS Take 4,000 mg by mouth daily.   Yes Historical Provider, MD  OVER THE COUNTER MEDICATION Apply 1 application topically 3 (three) times daily. Antifungal cream - applied to vaginal and sacral area with brief changes every shift (3 times daily)   Yes Historical Provider, MD  torsemide (DEMADEX) 10 MG tablet Take 10 mg by mouth daily.   Yes Historical Provider, MD  vitamin B-12 (CYANOCOBALAMIN) 500 MCG tablet Take 500 mcg by mouth daily.   Yes Historical Provider, MD  acetaminophen (TYLENOL) 325 MG tablet Take 2 tablets (650 mg total) by mouth every 6 (six) hours as needed for fever. Patient not taking: Reported on 02/14/2016 09/19/15   Theodis Blaze, MD  guaiFENesin (MUCINEX) 600 MG 12 hr tablet Take 1,200 mg by mouth 2 (two) times daily. 5 day course started 09/14/15    Historical Provider, MD  ipratropium-albuterol (DUONEB) 0.5-2.5 (3) MG/3ML SOLN Take 3 mLs by nebulization every 6 (six) hours as needed (for dyspnea or wheezing). Patient not taking: Reported on 02/14/2016 09/19/15   Theodis Blaze, MD  predniSONE (DELTASONE) 10 MG tablet Take 10 mg by mouth daily with breakfast.    Historical Provider, MD  sodium chloride flush (NS) 0.9 % SOLN Inject 3 mLs into the vein every 12 (twelve) hours. Patient not taking: Reported on 02/14/2016 06/23/15   Cherene Altes, MD    Family History Family History  Problem Relation Age of Onset  . Cancer Sister   . Diabetes Mellitus II Mother   . Tuberculosis Father     Social History Social History  Substance Use Topics  . Smoking status: Never Smoker  . Smokeless tobacco: Never Used  . Alcohol use No     Allergies   Benadryl [diphenhydramine]; Ciprofloxacin; Furosemide; Nitrofurantoin monohyd macro; Septra [bactrim]; Sulfa drugs cross reactors; and Ceclor  [cefaclor]   Review of Systems Review of Systems  Constitutional: Positive for appetite change and fever.  HENT: Positive for congestion. Negative for ear pain, sore throat and trouble swallowing.   Eyes: Negative for pain and visual disturbance.  Respiratory: Positive for cough and shortness of breath. Negative for wheezing.   Cardiovascular: Negative for chest pain and palpitations.  Gastrointestinal: Negative for abdominal pain and vomiting.  Genitourinary: Negative for difficulty urinating, dysuria and hematuria.  Musculoskeletal: Negative for arthralgias and back pain.  Skin: Negative for color change and rash.  Neurological: Positive for headaches. Negative for seizures and syncope.  All other systems reviewed and are negative.    Physical Exam Updated Vital Signs BP 157/73   Pulse 68   Temp 98.3 F (36.8 C) (Oral)   Resp 15   Wt 61.3 kg  SpO2 99%   BMI 23.18 kg/m   Physical Exam  Constitutional: She is oriented to person, place, and time. She appears well-developed and well-nourished.  HENT:  Head: Normocephalic and atraumatic.  Nose: Nose normal.  Mouth/Throat: Oropharynx is clear and moist.  Eyes: Conjunctivae and EOM are normal. Pupils are equal, round, and reactive to light.  Neck: Normal range of motion. Neck supple.  Cardiovascular: Normal rate.   Murmur heard. Pulmonary/Chest: Effort normal and breath sounds normal. No respiratory distress. She has no wheezes. She exhibits no tenderness.  Abdominal: Soft. Bowel sounds are normal. There is no tenderness. There is no rebound and no guarding.  Musculoskeletal: Normal range of motion.       Right ankle: She exhibits swelling.       Left ankle: She exhibits swelling.  Neurological: She is alert and oriented to person, place, and time.  Skin: Skin is warm. Capillary refill takes less than 2 seconds.  Psychiatric: She has a normal mood and affect. Her behavior is normal.  Nursing note and vitals  reviewed.    ED Treatments / Results  Labs (all labs ordered are listed, but only abnormal results are displayed) Labs Reviewed  BASIC METABOLIC PANEL - Abnormal; Notable for the following:       Result Value   Glucose, Bld 252 (*)    BUN 30 (*)    GFR calc non Af Amer 59 (*)    All other components within normal limits  CBC - Abnormal; Notable for the following:    Hemoglobin 10.2 (*)    HCT 31.9 (*)    MCH 25.9 (*)    All other components within normal limits  BRAIN NATRIURETIC PEPTIDE - Abnormal; Notable for the following:    B Natriuretic Peptide 641.8 (*)    All other components within normal limits  I-STAT TROPOININ, ED    EKG  EKG Interpretation  Date/Time:  Saturday February 14 2016 13:27:13 EST Ventricular Rate:  68 PR Interval:  176 QRS Duration: 86 QT Interval:  432 QTC Calculation: 459 R Axis:   103 Text Interpretation:  Normal sinus rhythm Rightward axis Borderline ECG Confirmed by Rogene Houston  MD, SCOTT [phone]) on 02/14/2016 3:17:06 PM       Radiology Dg Chest 2 View  Result Date: 02/14/2016 CLINICAL DATA:  Shortness of breath and chest pain. EXAM: CHEST  2 VIEW COMPARISON:  September 14, 2015 FINDINGS: No pneumothorax. The heart size is borderline. The hila are unremarkable. Small effusions with underlying opacities. Mild increased interstitial markings. No other changes. IMPRESSION: Small bilateral effusions and underlying atelectasis. Mild increased interstitial prominence may represent mild edema. Recommend follow-up to resolution. Electronically Signed   By: Dorise Bullion III M.D   On: 02/14/2016 14:35   Ct Chest Wo Contrast  Result Date: 02/14/2016 CLINICAL DATA:  Nonproductive cough and progressive shortness of breath for several days. Congestive heart failure. EXAM: CT CHEST WITHOUT CONTRAST TECHNIQUE: Multidetector CT imaging of the chest was performed following the standard protocol without IV contrast. COMPARISON:  Chest radiograph on 02/14/2016  FINDINGS: Cardiovascular: Heart size is within normal limits. Small pericardial effusion seen. Aortic atherosclerosis. Coronary artery calcification also noted. Mediastinum/Nodes: No masses or pathologically enlarged lymph nodes identified on this unenhanced exam. Lungs/Pleura: Small pleural effusions seen bilaterally. Mild compressive atelectasis seen in both lower lobes. Mild scarring seen bilaterally, with associated mild bronchiectasis in the medial right upper lobe and anterior right lower lobe. No evidence of pulmonary consolidation or mass. 3 mm  noncalcified pulmonary nodule seen in the posterior lateral right upper lobe on image 53/205. Upper Abdomen: Probable splenomegaly noted although spleen is incompletely visualized on this exam. Musculoskeletal: No suspicious bone lesions or other significant abnormality. IMPRESSION: Small bilateral pleural effusions and bibasilar atelectasis. No evidence of pulmonary consolidation or mass. Small pericardial effusion. Mild right upper and lower lobe scarring with associated mild traction bronchiectasis. 3 mm indeterminate right upper lobe pulmonary nodule. No follow-up needed if patient is low-risk. Non-contrast chest CT can be considered in 12 months if patient is high-risk. This recommendation follows the consensus statement: Guidelines for Management of Incidental Pulmonary Nodules Detected on CT Images: From the Fleischner Society 2017; Radiology 2017; 284:228-243. Suspect splenomegaly, although spleen is incompletely visualized on this exam. Electronically Signed   By: Earle Gell M.D.   On: 02/14/2016 16:45    Procedures Procedures (including critical care time)  Medications Ordered in ED Medications - No data to display   Initial Impression / Assessment and Plan / ED Course  I have reviewed the triage vital signs and the nursing notes.  Pertinent labs & imaging results that were available during my care of the patient were reviewed by me and  considered in my medical decision making (see chart for details).  Clinical Course   On exam patient is afebrile, VSS. Systolic murmur noticed on exam. Breath sounds are otherwise normal. Patient still had shortness of breath, No wheezing. Abdomen is soft and nontender. Patient notable for peripheral edema is 2+.  EKG negative for any acute findings. Patient feels like she needs oxygen though her oxygen has not lowered below 96% on room air. Normal sinus rhythm with no QT prolongation, heart block. Lab work shows new changes in creatinine. X-rays positive for small bilateral effusions and underlying atelectasis.  Troponin negative. Will order a BNP chest CT without contrast to evaluate for congestive heart failure, peripheral edema and possible early pneumonia. The patient ambulated and tolerated well with oxygen saturation fluctuating between 96% and 99% on room air.  Care of patient will be transferred to Dr. Dewaine Conger, who will assess and plan accordingly.   Final Clinical Impressions(s) / ED Diagnoses   Final diagnoses:  None    New Prescriptions New Prescriptions   No medications on file     [place], [place] 02/14/16 1723    Fredia Sorrow, MD 02/18/16 [phone]

## 2016-02-15 DIAGNOSIS — R911 Solitary pulmonary nodule: Secondary | ICD-10-CM | POA: Diagnosis present

## 2016-02-15 LAB — BASIC METABOLIC PANEL
ANION GAP: 7 (ref 5–15)
BUN: 29 mg/dL — ABNORMAL HIGH (ref 6–20)
CALCIUM: 8.9 mg/dL (ref 8.9–10.3)
CO2: 24 mmol/L (ref 22–32)
Chloride: 107 mmol/L (ref 101–111)
Creatinine, Ser: 0.89 mg/dL (ref 0.44–1.00)
Glucose, Bld: 148 mg/dL — ABNORMAL HIGH (ref 65–99)
POTASSIUM: 3.8 mmol/L (ref 3.5–5.1)
Sodium: 138 mmol/L (ref 135–145)

## 2016-02-15 LAB — GLUCOSE, CAPILLARY
GLUCOSE-CAPILLARY: 132 mg/dL — AB (ref 65–99)
Glucose-Capillary: 193 mg/dL — ABNORMAL HIGH (ref 65–99)
Glucose-Capillary: 158 mg/dL — ABNORMAL HIGH (ref 65–99)

## 2016-02-15 MED ORDER — HYDRALAZINE HCL 50 MG PO TABS
50.0000 mg | ORAL_TABLET | Freq: Three times a day (TID) | ORAL | Status: DC
  Administered 2016-02-15 – 2016-02-16 (×4): 50 mg via ORAL
  Filled 2016-02-15 (×4): qty 1

## 2016-02-15 MED ORDER — INSULIN ASPART 100 UNIT/ML ~~LOC~~ SOLN
0.0000 [IU] | Freq: Three times a day (TID) | SUBCUTANEOUS | Status: DC
  Administered 2016-02-15 (×2): 2 [IU] via SUBCUTANEOUS
  Administered 2016-02-16: 1 [IU] via SUBCUTANEOUS

## 2016-02-15 MED ORDER — POTASSIUM CHLORIDE CRYS ER 20 MEQ PO TBCR
40.0000 meq | EXTENDED_RELEASE_TABLET | Freq: Two times a day (BID) | ORAL | Status: DC
  Administered 2016-02-15 – 2016-02-16 (×3): 40 meq via ORAL
  Filled 2016-02-15 (×3): qty 2

## 2016-02-15 NOTE — Progress Notes (Signed)
PROGRESS NOTE    Emily Kennedy  [id number] DOB: April 10, 1945 DOA: 02/14/2016 PCP: Pcp Not In System  Brief Narrative: Emily Kennedy is a 70 y.o. female with medical history significant of Chronic diatsolic CHF on torsemide, DM, HTN, vasculitis on prednisone, hypothyroidism presents to the ER with dyspnea Pt reports progressive dyspnea with exertion for 2-3days worsened today, reports leg swelling, Orthopnea and dry cough. In ED, noted to have BNP 641, CTA chest with bilateral effusions  Assessment & Plan:  Acute on chronic diastolic CHF -improving, on IV lasix, continue IV lasix today -negative >1L -ECHo 3/17 with normal EF and Grade 2 Diastolic dysfunction -torsemide on hold -Bmet in am    Diabetes mellitus type 2, uncontrolled ([place]) -on SSI at home -keep on SSI now, stable    Anemia, chronic disease -stable    Hypothyroidism -continue synthroid    Essential hypertension -stable, hold ACE, cut down hydralazine dose to allow more room for diuretics   H/o vasculitis -stable, on prednisone 10mg  continued, clinically do not think she needs stress dose at this time  Pulm nodule -noted on CTA chest, needs FU  DVT prophylaxis:lovenox Code Status: Full Code Family Communication: d/w Son at bedside 12/2 Disposition Plan: home tomorrow if stable     Subjective: Breathing much better  Objective: Vitals:   02/14/16 2008 02/14/16 2016 02/14/16 2342 02/15/16 0659  BP:  (!) 157/68 (!) 184/80 (!) 154/64  Pulse:  74 74 85  Resp:  18 18 18   Temp:  98.1 F (36.7 C) 98.2 F (36.8 C) 98.5 F (36.9 C)  TempSrc:  Oral Oral Oral  SpO2:  95% 94% 95%  Weight: 60.5 kg (133 lb 4.8 oz)   59.9 kg (132 lb)  Height: 5\' 4"  (1.626 m)       Intake/Output Summary (Last 24 hours) at 02/15/16 1045 Last data filed at 02/15/16 0900  Gross per 24 hour  Intake              240 ml  Output             1425 ml  Net            -1185 ml   Filed Weights   02/14/16 1335  02/14/16 2008 02/15/16 0659  Weight: 61.3 kg (135 lb 1 oz) 60.5 kg (133 lb 4.8 oz) 59.9 kg (132 lb)    Examination:  General exam: Appears calm and comfortable, AAOx3 Respiratory system: fine basilar crackles Cardiovascular system: S1 & S2 heard, RRR. No JVD, murmurs, rubs, gallops or clicks.  Gastrointestinal system: Abdomen is nondistended, soft and nontender. Normal bowel sounds heard. Central nervous system: Alert and oriented. No focal neurological deficits. Extremities: 1plus edema Skin: No rashes, lesions or ulcers Psychiatry: Judgement and insight appear normal. Mood & affect appropriate.     Data Reviewed: I have personally reviewed following labs and imaging studies  CBC:  Recent Labs Lab 02/14/16 1340  WBC 5.1  HGB 10.2*  HCT 31.9*  MCV 81.0  PLT 0000000   Basic Metabolic Panel:  Recent Labs Lab 02/14/16 1340 02/15/16 0422  NA 135 138  K 4.4 3.8  CL 104 107  CO2 23 24  GLUCOSE 252* 148*  BUN 30* 29*  CREATININE 0.96 0.89  CALCIUM 9.6 8.9   GFR: Estimated Creatinine Clearance: 51.5 mL/min (by C-G formula based on SCr of 0.89 mg/dL). Liver Function Tests: No results for input(s): AST, ALT, ALKPHOS, BILITOT, PROT, ALBUMIN in the last 168 hours. No  results for input(s): LIPASE, AMYLASE in the last 168 hours. No results for input(s): AMMONIA in the last 168 hours. Coagulation Profile: No results for input(s): INR, PROTIME in the last 168 hours. Cardiac Enzymes:  Recent Labs Lab 02/14/16 2049  TROPONINI <0.03   BNP (last 3 results) No results for input(s): PROBNP in the last 8760 hours. HbA1C: No results for input(s): HGBA1C in the last 72 hours. CBG:  Recent Labs Lab 02/14/16 2006  GLUCAP 259*   Lipid Profile: No results for input(s): CHOL, HDL, LDLCALC, TRIG, CHOLHDL, LDLDIRECT in the last 72 hours. Thyroid Function Tests: No results for input(s): TSH, T4TOTAL, FREET4, T3FREE, THYROIDAB in the last 72 hours. Anemia Panel: No results for  input(s): VITAMINB12, FOLATE, FERRITIN, TIBC, IRON, RETICCTPCT in the last 72 hours. Urine analysis:    Component Value Date/Time   COLORURINE YELLOW 09/14/2015 1416   APPEARANCEUR TURBID (A) 09/14/2015 1416   LABSPEC 1.014 09/14/2015 1416   PHURINE 6.5 09/14/2015 1416   GLUCOSEU NEGATIVE 09/14/2015 1416   HGBUR LARGE (A) 09/14/2015 1416   BILIRUBINUR NEGATIVE 09/14/2015 1416   KETONESUR NEGATIVE 09/14/2015 1416   PROTEINUR 100 (A) 09/14/2015 1416   UROBILINOGEN 1.0 01/06/2011 2252   NITRITE NEGATIVE 09/14/2015 1416   LEUKOCYTESUR LARGE (A) 09/14/2015 1416   Sepsis Labs: @LABRCNTIP (procalcitonin:4,lacticidven:4)  )No results found for this or any previous visit (from the past 240 hour(s)).       Radiology Studies: Dg Chest 2 View  Result Date: 02/14/2016 CLINICAL DATA:  Shortness of breath and chest pain. EXAM: CHEST  2 VIEW COMPARISON:  September 14, 2015 FINDINGS: No pneumothorax. The heart size is borderline. The hila are unremarkable. Small effusions with underlying opacities. Mild increased interstitial markings. No other changes. IMPRESSION: Small bilateral effusions and underlying atelectasis. Mild increased interstitial prominence may represent mild edema. Recommend follow-up to resolution. Electronically Signed   By: Dorise Bullion III M.D   On: 02/14/2016 14:35   Ct Chest Wo Contrast  Result Date: 02/14/2016 CLINICAL DATA:  Nonproductive cough and progressive shortness of breath for several days. Congestive heart failure. EXAM: CT CHEST WITHOUT CONTRAST TECHNIQUE: Multidetector CT imaging of the chest was performed following the standard protocol without IV contrast. COMPARISON:  Chest radiograph on 02/14/2016 FINDINGS: Cardiovascular: Heart size is within normal limits. Small pericardial effusion seen. Aortic atherosclerosis. Coronary artery calcification also noted. Mediastinum/Nodes: No masses or pathologically enlarged lymph nodes identified on this unenhanced exam.  Lungs/Pleura: Small pleural effusions seen bilaterally. Mild compressive atelectasis seen in both lower lobes. Mild scarring seen bilaterally, with associated mild bronchiectasis in the medial right upper lobe and anterior right lower lobe. No evidence of pulmonary consolidation or mass. 3 mm noncalcified pulmonary nodule seen in the posterior lateral right upper lobe on image 53/205. Upper Abdomen: Probable splenomegaly noted although spleen is incompletely visualized on this exam. Musculoskeletal: No suspicious bone lesions or other significant abnormality. IMPRESSION: Small bilateral pleural effusions and bibasilar atelectasis. No evidence of pulmonary consolidation or mass. Small pericardial effusion. Mild right upper and lower lobe scarring with associated mild traction bronchiectasis. 3 mm indeterminate right upper lobe pulmonary nodule. No follow-up needed if patient is low-risk. Non-contrast chest CT can be considered in 12 months if patient is high-risk. This recommendation follows the consensus statement: Guidelines for Management of Incidental Pulmonary Nodules Detected on CT Images: From the Fleischner Society 2017; Radiology 2017; 284:228-243. Suspect splenomegaly, although spleen is incompletely visualized on this exam. Electronically Signed   By: Sharrie Rothman.D.  On: 02/14/2016 16:45        Scheduled Meds: . carvedilol  25 mg Oral BID  . diltiazem  90 mg Oral Q6H  . enoxaparin (LOVENOX) injection  40 mg Subcutaneous Q24H  . ferrous sulfate  325 mg Oral BID WC  . furosemide  40 mg Intravenous Q12H  . gabapentin  100 mg Oral Daily  . hydrALAZINE  50 mg Oral TID  . insulin aspart  0-9 Units Subcutaneous TID WC  . levothyroxine  150 mcg Oral QAC breakfast  . magnesium oxide  400 mg Oral Daily  . multivitamin with minerals  1 tablet Oral Daily  . potassium chloride  40 mEq Oral BID  . sodium chloride flush  3 mL Intravenous Q12H   Continuous Infusions:   LOS: 1 day    Time  spent: 42min    Domenic Polite, MD [facility] Pager [phone]  If 7PM-7AM, please contact night-coverage [email or web address] Password [email or web address] 02/15/2016, 10:45 AM

## 2016-02-16 LAB — GLUCOSE, CAPILLARY: GLUCOSE-CAPILLARY: 134 mg/dL — AB (ref 65–99)

## 2016-02-16 LAB — BASIC METABOLIC PANEL
Anion gap: 6 (ref 5–15)
BUN: 26 mg/dL — AB (ref 6–20)
CHLORIDE: 104 mmol/L (ref 101–111)
CO2: 26 mmol/L (ref 22–32)
Calcium: 8.9 mg/dL (ref 8.9–10.3)
Creatinine, Ser: 0.92 mg/dL (ref 0.44–1.00)
GFR calc Af Amer: >60 mL/min (ref >60)
GFR calc non Af Amer: >60 mL/min (ref >60)
Glucose, Bld: 136 mg/dL — ABNORMAL HIGH (ref 65–99)
POTASSIUM: 4.5 mmol/L (ref 3.5–5.1)
SODIUM: 136 mmol/L (ref 135–145)

## 2016-02-16 MED ORDER — TORSEMIDE 10 MG PO TABS: 20.0000 mg | ORAL_TABLET | Freq: Every day | ORAL | 0 refills | Status: DC

## 2016-02-16 NOTE — Discharge Summary (Signed)
Physician Discharge Summary  Emily Kennedy [id number] DOB: 02-13-1946 DOA: 02/14/2016  PCP: Pcp Not In System  Admit date: 02/14/2016 Discharge date: 02/16/2016  Time spent: 45 minutes  Recommendations for Outpatient Follow-up:  1. PCP in 1 week, please check Bmet at FU, Torsemide dose increased  2. Solitary Pulm nodule needs FU   Discharge Diagnoses:      Acute on chronic diastolic CHF (congestive heart failure), NYHA class 3 (HCC)   Diabetes mellitus type 2, uncontrolled (HCC)   Anemia, chronic disease   Hypothyroidism   Essential hypertension   Anemia of chronic disease   Malnutrition of moderate degree   Solitary pulmonary nodule   Discharge Condition: stable  Diet recommendation: LOW sodium diabetic  Filed Weights   02/14/16 2008 02/15/16 0659 02/16/16 0345  Weight: 60.5 kg (133 lb 4.8 oz) 59.9 kg (132 lb) 58.8 kg (129 lb 11.2 oz)    History of present illness:  Emily Kennedy a 70 y.o.femalewith medical history significant of Chronic diatsolic CHF on torsemide, DM, HTN, vasculitis on prednisone, hypothyroidism presents to the ER with dyspnea Pt reports progressive dyspnea with exertion for 2-3days worsened today, reports leg swelling, Orthopnea and dry cough. In ED, noted to haveBNP 641, CTA chest with bilateral effusions  Hospital Course:    Acute on chronic diastolic CHF -improved with diuresis using IV lasix -negative 3L and clinically much improved -ECHo 3/17 with normal EF and Grade 2 Diastolic dysfunction -torsemide resumed at a higher dose at discharge 20mg , was taking 10mg  daily previously -needs Bmet in 1 week  Pulm nodule -non smoker, noted on CTA chest, needs Follow up  Diabetes mellitus type 2, uncontrolled (HCC) -on SSI at home -stable  Anemia, chronic disease -stable  Hypothyroidism -continue synthroid  Essential hypertension -stable, hold ACE, cut down hydralazine dose to allow more room for diuretics  H/o  vasculitis -stable, on prednisone 10mg  continued   Discharge Exam: Vitals:   02/16/16 0345 02/16/16 0843  BP: (!) 150/59 (!) 148/61  Pulse: 68 66  Resp: 18 18  Temp: 98.4 F (36.9 C) 98.2 F (36.8 C)    General: AAOx3 Cardiovascular: S1S2/RRR Respiratory: CTAB  Discharge Instructions   Discharge Instructions    Diet - low sodium heart healthy    Complete by:  As directed    Diet Carb Modified    Complete by:  As directed    Increase activity slowly    Complete by:  As directed      Current Discharge Medication List    CONTINUE these medications which have CHANGED   Details  torsemide (DEMADEX) 10 MG tablet Take 2 tablets (20 mg total) by mouth daily. Qty: 30 tablet, Refills: 0      CONTINUE these medications which have NOT CHANGED   Details  carvedilol (COREG) 25 MG tablet Take 1 tablet (25 mg total) by mouth 2 (two) times daily.    cholecalciferol (VITAMIN D) 400 units TABS tablet Take 400 Units by mouth daily.    diltiazem (CARDIZEM) 90 MG tablet Take 90 mg by mouth every 6 (six) hours. Midnight, 6am, noon, 6pm    ferrous sulfate 325 (65 FE) MG tablet Take 325 mg by mouth 2 (two) times daily with a meal.    gabapentin (NEURONTIN) 100 MG capsule Take 100 mg by mouth daily.    GLUCERNA (GLUCERNA) LIQD Take 237 mLs by mouth 2 (two) times daily.    glucosamine-chondroitin 500-400 MG tablet Take 1 tablet by mouth 2 (two) times  daily.    hydrALAZINE (APRESOLINE) 100 MG tablet Take 100 mg by mouth 3 (three) times daily.    insulin aspart (NOVOLOG) 100 UNIT/ML injection Inject 0-9 Units into the skin every 4 (four) hours. Qty: 10 mL, Refills: 11    levothyroxine (SYNTHROID, LEVOTHROID) 150 MCG tablet Take 150 mcg by mouth daily before breakfast.    lisinopril (PRINIVIL,ZESTRIL) 30 MG tablet Take 30 mg by mouth daily.    magnesium oxide (MAG-OX) 400 MG tablet Take 400 mg by mouth daily.    !! Multiple Vitamin (MULTIVITAMIN WITH MINERALS) TABS tablet Take 1  tablet by mouth daily.    !! Multiple Vitamin (MULTIVITAMIN) tablet Take 1 tablet by mouth daily.    Omega-3 Fatty Acids (FISH OIL) 1000 MG CAPS Take 4,000 mg by mouth daily.    OVER THE COUNTER MEDICATION Apply 1 application topically 3 (three) times daily. Antifungal cream - applied to vaginal and sacral area with brief changes every shift (3 times daily)    vitamin B-12 (CYANOCOBALAMIN) 500 MCG tablet Take 500 mcg by mouth daily.    acetaminophen (TYLENOL) 325 MG tablet Take 2 tablets (650 mg total) by mouth every 6 (six) hours as needed for fever.    ipratropium-albuterol (DUONEB) 0.5-2.5 (3) MG/3ML SOLN Take 3 mLs by nebulization every 6 (six) hours as needed (for dyspnea or wheezing). Qty: 360 mL, Refills: 0     !! - Potential duplicate medications found. Please discuss with provider.    STOP taking these medications     guaiFENesin (MUCINEX) 600 MG 12 hr tablet      predniSONE (DELTASONE) 10 MG tablet      sodium chloride flush (NS) 0.9 % SOLN        Allergies  Allergen Reactions  . Benadryl [Diphenhydramine] Swelling  . Ciprofloxacin Hives  . Furosemide Itching, Swelling, Rash and Other (See Comments)    Made edema worse  . Nitrofurantoin Monohyd Macro Hives  . Septra [Bactrim] Hives  . Sulfa Drugs Cross Reactors Hives  . Ceclor [Cefaclor] Rash    Breaks out in rash chest down.   Follow-up Information    PCP. Schedule an appointment as soon as possible for a visit in 1 week(s).            The results of significant diagnostics from this hospitalization (including imaging, microbiology, ancillary and laboratory) are listed below for reference.    Significant Diagnostic Studies: Dg Chest 2 View  Result Date: 02/14/2016 CLINICAL DATA:  Shortness of breath and chest pain. EXAM: CHEST  2 VIEW COMPARISON:  September 14, 2015 FINDINGS: No pneumothorax. The heart size is borderline. The hila are unremarkable. Small effusions with underlying opacities. Mild increased  interstitial markings. No other changes. IMPRESSION: Small bilateral effusions and underlying atelectasis. Mild increased interstitial prominence may represent mild edema. Recommend follow-up to resolution. Electronically Signed   By: Dorise Bullion III M.D   On: 02/14/2016 14:35   Ct Chest Wo Contrast  Result Date: 02/14/2016 CLINICAL DATA:  Nonproductive cough and progressive shortness of breath for several days. Congestive heart failure. EXAM: CT CHEST WITHOUT CONTRAST TECHNIQUE: Multidetector CT imaging of the chest was performed following the standard protocol without IV contrast. COMPARISON:  Chest radiograph on 02/14/2016 FINDINGS: Cardiovascular: Heart size is within normal limits. Small pericardial effusion seen. Aortic atherosclerosis. Coronary artery calcification also noted. Mediastinum/Nodes: No masses or pathologically enlarged lymph nodes identified on this unenhanced exam. Lungs/Pleura: Small pleural effusions seen bilaterally. Mild compressive atelectasis seen in both lower  lobes. Mild scarring seen bilaterally, with associated mild bronchiectasis in the medial right upper lobe and anterior right lower lobe. No evidence of pulmonary consolidation or mass. 3 mm noncalcified pulmonary nodule seen in the posterior lateral right upper lobe on image 53/205. Upper Abdomen: Probable splenomegaly noted although spleen is incompletely visualized on this exam. Musculoskeletal: No suspicious bone lesions or other significant abnormality. IMPRESSION: Small bilateral pleural effusions and bibasilar atelectasis. No evidence of pulmonary consolidation or mass. Small pericardial effusion. Mild right upper and lower lobe scarring with associated mild traction bronchiectasis. 3 mm indeterminate right upper lobe pulmonary nodule. No follow-up needed if patient is low-risk. Non-contrast chest CT can be considered in 12 months if patient is high-risk. This recommendation follows the consensus statement: Guidelines  for Management of Incidental Pulmonary Nodules Detected on CT Images: From the Fleischner Society 2017; Radiology 2017; 284:228-243. Suspect splenomegaly, although spleen is incompletely visualized on this exam. Electronically Signed   By: Earle Gell M.D.   On: 02/14/2016 16:45    Microbiology: No results found for this or any previous visit (from the past 240 hour(s)).   Labs: Basic Metabolic Panel:  Recent Labs Lab 02/14/16 1340 02/15/16 0422 02/16/16 0310  NA 135 138 136  K 4.4 3.8 4.5  CL 104 107 104  CO2 23 24 26   GLUCOSE 252* 148* 136*  BUN 30* 29* 26*  CREATININE 0.96 0.89 0.92  CALCIUM 9.6 8.9 8.9   Liver Function Tests: No results for input(s): AST, ALT, ALKPHOS, BILITOT, PROT, ALBUMIN in the last 168 hours. No results for input(s): LIPASE, AMYLASE in the last 168 hours. No results for input(s): AMMONIA in the last 168 hours. CBC:  Recent Labs Lab 02/14/16 1340  WBC 5.1  HGB 10.2*  HCT 31.9*  MCV 81.0  PLT 181   Cardiac Enzymes:  Recent Labs Lab 02/14/16 2049  TROPONINI <0.03   BNP: BNP (last 3 results)  Recent Labs  06/28/15 0719 09/14/15 2111 02/14/16 1340  BNP 1,048.5* 340.0* 641.8*    ProBNP (last 3 results) No results for input(s): PROBNP in the last 8760 hours.  CBG:  Recent Labs Lab 02/14/16 2006 02/15/16 1111 02/15/16 1629 02/15/16 2042 02/16/16 0601  GLUCAP 259* 193* 158* 132* 134*       SignedDomenic Polite MD.  [facility] 02/16/2016, 9:58 AM

## 2016-05-28 ENCOUNTER — Other Ambulatory Visit: Payer: Self-pay | Admitting: Internal Medicine

## 2016-05-28 DIAGNOSIS — R109 Unspecified abdominal pain: Secondary | ICD-10-CM

## 2016-09-02 ENCOUNTER — Encounter (HOSPITAL_COMMUNITY): Payer: Self-pay

## 2016-09-02 ENCOUNTER — Emergency Department (HOSPITAL_COMMUNITY)
Admission: EM | Admit: 2016-09-02 | Discharge: 2016-09-03 | Disposition: A | Discharge status: Home / Self Care | Payer: Medicare Other | Attending: Emergency Medicine | Admitting: Emergency Medicine

## 2016-09-02 ENCOUNTER — Emergency Department (HOSPITAL_COMMUNITY): Payer: Medicare Other

## 2016-09-02 DIAGNOSIS — Z79899 Other long term (current) drug therapy: Secondary | ICD-10-CM | POA: Diagnosis not present

## 2016-09-02 DIAGNOSIS — R509 Fever, unspecified: Secondary | ICD-10-CM

## 2016-09-02 DIAGNOSIS — I110 Hypertensive heart disease with heart failure: Secondary | ICD-10-CM | POA: Insufficient documentation

## 2016-09-02 DIAGNOSIS — M792 Neuralgia and neuritis, unspecified: Secondary | ICD-10-CM

## 2016-09-02 DIAGNOSIS — E119 Type 2 diabetes mellitus without complications: Secondary | ICD-10-CM | POA: Diagnosis not present

## 2016-09-02 DIAGNOSIS — R1084 Generalized abdominal pain: Secondary | ICD-10-CM | POA: Insufficient documentation

## 2016-09-02 DIAGNOSIS — I5032 Chronic diastolic (congestive) heart failure: Secondary | ICD-10-CM | POA: Diagnosis not present

## 2016-09-02 LAB — CBC WITH DIFFERENTIAL/PLATELET
Basophils Absolute: 0 10*3/uL (ref 0.0–0.1)
Basophils Relative: 0 %
EOS ABS: 0.2 10*3/uL (ref 0.0–0.7)
Eosinophils Relative: 2 %
HEMATOCRIT: 30.2 % — AB (ref 36.0–46.0)
HEMOGLOBIN: 10 g/dL — AB (ref 12.0–15.0)
LYMPHS ABS: 0.6 10*3/uL — AB (ref 0.7–4.0)
Lymphocytes Relative: 8 %
MCH: 28.7 pg (ref 26.0–34.0)
MCHC: 33.1 g/dL (ref 30.0–36.0)
MCV: 86.8 fL (ref 78.0–100.0)
MONOS PCT: 3 %
Monocytes Absolute: 0.2 10*3/uL (ref 0.1–1.0)
NEUTROS ABS: 6.8 10*3/uL (ref 1.7–7.7)
NEUTROS PCT: 87 %
Platelets: 111 10*3/uL — ABNORMAL LOW (ref 150–400)
RBC: 3.48 MIL/uL — ABNORMAL LOW (ref 3.87–5.11)
RDW: 16.5 % — ABNORMAL HIGH (ref 11.5–15.5)
WBC: 7.8 10*3/uL (ref 4.0–10.5)

## 2016-09-02 LAB — COMPREHENSIVE METABOLIC PANEL
ALK PHOS: 43 U/L (ref 38–126)
ALT: 17 U/L (ref 14–54)
ANION GAP: 9 (ref 5–15)
AST: 18 U/L (ref 15–41)
Albumin: 3.5 g/dL (ref 3.5–5.0)
BILIRUBIN TOTAL: 0.5 mg/dL (ref 0.3–1.2)
BUN: 51 mg/dL — ABNORMAL HIGH (ref 6–20)
CALCIUM: 10.1 mg/dL (ref 8.9–10.3)
CO2: 20 mmol/L — ABNORMAL LOW (ref 22–32)
Chloride: 106 mmol/L (ref 101–111)
Creatinine, Ser: 1.4 mg/dL — ABNORMAL HIGH (ref 0.44–1.00)
GFR, EST AFRICAN AMERICAN: 43 mL/min — AB (ref >60)
GFR, EST NON AFRICAN AMERICAN: 37 mL/min — AB (ref >60)
Glucose, Bld: 169 mg/dL — ABNORMAL HIGH (ref 65–99)
Potassium: 4.6 mmol/L (ref 3.5–5.1)
Sodium: 135 mmol/L (ref 135–145)
TOTAL PROTEIN: 7 g/dL (ref 6.5–8.1)

## 2016-09-02 LAB — I-STAT CG4 LACTIC ACID, ED: Lactic Acid, Venous: 0.65 mmol/L (ref 0.5–1.9)

## 2016-09-02 MED ORDER — SODIUM CHLORIDE 0.9 % IV BOLUS (SEPSIS)
1000.0000 mL | Freq: Once | INTRAVENOUS | Status: AC
  Administered 2016-09-02: 1000 mL via INTRAVENOUS

## 2016-09-02 MED ORDER — MORPHINE SULFATE (PF) 4 MG/ML IV SOLN
4.0000 mg | Freq: Once | INTRAVENOUS | Status: AC
  Administered 2016-09-02: 4 mg via INTRAVENOUS
  Filled 2016-09-02: qty 1

## 2016-09-02 MED ORDER — ACETAMINOPHEN 325 MG PO TABS
650.0000 mg | ORAL_TABLET | Freq: Once | ORAL | Status: AC
  Administered 2016-09-02: 650 mg via ORAL
  Filled 2016-09-02: qty 2

## 2016-09-02 NOTE — ED Provider Notes (Signed)
12:00 AM  Assumed care from Dr. Darl Householder.  Patient is a 71 year old female with recent history of right flank shingles that completed 1 course of prednisone and 2 courses of Valtrex. The symptoms started 3 weeks ago. Presents today with increasing flank pain and chills. Found to be febrile here to 101. Does have a heel ulcer that does not appear infected. Recently placed on Augmentin by her primary care physician. No signs or symptoms to suggest meningitis. Patient's creatinine is mildly elevated at 1.4 from recent of 0.9. She is receiving IV fluids. Otherwise labs are unremarkable  with normal white count and normal lactate. Normal chest x-ray. Urinalysis pending.  2:25 AM  Pt's urine shows no sign of infection.  She reveals that her pain is been well controlled after morphine. She declines any further pain medication at this time. She does have blood cultures pending but given she is so well-appearing and does not appear toxic, I feel patient is safe to be discharged in her family is comfortable with this plan as well. Patient is happy with the plan to go home. She will follow-up with her outpatient provider. Have advised her to continue the Augmentin for her heel ulcer although at this time it does not appear infected. She again denies any infectious symptoms other than having chills. No cough, sore throat, ear pain, headache, neck pain or neck stiffness, vomiting, diarrhea, rash. No recent tick bites. No sick contacts. We'll discharge her short course of Vicodin for pain control for her neuropathic pain from her recent shingles. This area has been evaluated again and appears to be healing. There are no open lesions, signs of active shingles. No signs of cellulitis. Discussed at length return cautions with patient and family. They're comfortable with this plan to go home.  At this time, I do not feel there is any life-threatening condition present. I have reviewed and discussed all results (EKG, imaging, lab,  urine as appropriate) and exam findings with patient/family. I have reviewed nursing notes and appropriate previous records.  I feel the patient is safe to be discharged home without further emergent workup and can continue workup as an outpatient as needed. Discussed usual and customary return precautions. Patient/family verbalize understanding and are comfortable with this plan.  Outpatient follow-up has been provided if needed. All questions have been answered.    Ward, Delice Bison, DO 09/03/16 [phone]

## 2016-09-02 NOTE — ED Notes (Signed)
Pt's shingles rash appears to be dry and cracking, no serous drainage noted. Merry Proud, [place] asked to come assess patient.

## 2016-09-02 NOTE — ED Notes (Signed)
Patient transported to X-ray 

## 2016-09-02 NOTE — ED Provider Notes (Signed)
[facility] DEPT Provider Note   CSN: [id number] Arrival date & time: 09/02/16  1905     History   Chief Complaint Chief Complaint  Patient presents with  . Herpes Zoster    HPI Emily Kennedy is a 71 y.o. female hx of CHF, DM, HTN, Here presenting with right flank pain, fever, chills. Patient states that she was diagnosed with herpes zoster about 3 weeks ago. Patient finished 2 courses of Valtrex as well as a course of steroids. She is taking gabapentin for pain. Patient states that for the last several days, she has had been having worsening pain. She also has some chills as well. States that her gabapentin was increased but it did not help her with pain. She also has a heel ulcer and was recently put on Augmentin.     The history is provided by the patient.    Past Medical History:  Diagnosis Date  . CHF (congestive heart failure) ([place])   . Diabetes mellitus   . Hypertension   . Thyroid disease   . Vasculitis [facility])     Patient Active Problem List   Diagnosis Date Noted  . Solitary pulmonary nodule 02/15/2016  . CHF exacerbation ([place]) 02/14/2016  . CHF (congestive heart failure) ([place]) 02/14/2016  . Malnutrition of moderate degree 09/18/2015  . Benign essential HTN   . Vasculitis ([place])   . Rheumatoid arthritis ([place])   . History of recent hospitalization   . Acute blood loss anemia   . Anemia of chronic disease   . Hyponatremia 09/15/2015  . HCAP (healthcare-associated pneumonia) 09/14/2015  . Respiratory arrest ([place]) intubated 4/13 post arrest 06/27/2015  . Cardiac arrest ([place]) 4/13 suspected mucus plugging as cause of arrest 06/27/2015  . Controlled diabetes mellitus type 2 with complications ([place])   . Dysphagia   . Atrial fibrillation with RVR ([place]) 06/18/2015  . Acute on chronic diastolic CHF (congestive heart failure), NYHA class 3 ([place])   . Acute encephalopathy 06/14/2015  . Anemia, chronic disease 05/18/2015  . Hypothyroidism 05/18/2015  . Essential  hypertension 05/18/2015  . Diabetes mellitus type 2, uncontrolled ([place]) 04/09/2015  . 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 MELANOMA EXCISION    . TUBAL LIGATION      OB History    No data available       Home Medications    Prior to Admission medications   Medication Sig Start Date End Date Taking? Authorizing Provider  acetaminophen (TYLENOL) 325 MG tablet Take 2 tablets (650 mg total) by mouth every 6 (six) hours as needed for fever. Patient taking differently: Take 650 mg by mouth daily.  09/19/15  Yes Theodis Blaze, MD  amoxicillin-clavulanate (AUGMENTIN) 500-125 MG tablet Take 1 tablet by mouth 2 (two) times daily.   Yes [provider]  Calcium-Magnesium-Vitamin D (CALCIUM 500 PO) Take 500 mg by mouth every morning.   Yes [provider]  carvedilol (COREG) 25 MG tablet Take 1 tablet (25 mg total) by mouth 2 (two) times daily. 09/19/15  Yes Theodis Blaze, MD  cholecalciferol (VITAMIN D) 400 units TABS tablet Take 400 Units by mouth daily.   Yes [provider]  diltiazem (CARDIZEM) 90 MG tablet Take 90 mg by mouth 3 (three) times daily. Midnight, 6am, noon, 6pm    Yes [provider]  ferrous sulfate 325 (65 FE) MG tablet Take 325 mg by mouth 2 (two) times daily  with a meal.   Yes [provider]  gabapentin (NEURONTIN) 100 MG capsule Take 100 mg by mouth 2 (two) times daily.    Yes [provider]  GLUCERNA (GLUCERNA) LIQD Take 237 mLs by mouth daily.    Yes [provider]  glucosamine-chondroitin 500-400 MG tablet Take 2 tablets by mouth daily.    Yes [provider]  hydrALAZINE (APRESOLINE) 100 MG tablet Take 100 mg by mouth 3 (three) times daily.   Yes [provider]  levothyroxine (SYNTHROID, LEVOTHROID) 112 MCG tablet Take 224 mcg by mouth daily before breakfast.    Yes [provider]  lisinopril  (PRINIVIL,ZESTRIL) 40 MG tablet Take 40 mg by mouth 2 (two) times daily.    Yes [provider]  Multiple Vitamin (MULTIVITAMIN) tablet Take 1 tablet by mouth daily.   Yes [provider]  Omega-3 Fatty Acids (FISH OIL) 1000 MG CAPS Take 4,000 mg by mouth daily.   Yes [provider]  torsemide (DEMADEX) 10 MG tablet Take 2 tablets (20 mg total) by mouth daily. 02/16/16  Yes Domenic Polite, MD  vitamin B-12 (CYANOCOBALAMIN) 500 MCG tablet Take 500 mcg by mouth daily.   Yes [provider]  insulin aspart (NOVOLOG) 100 UNIT/ML injection Inject 0-9 Units into the skin every 4 (four) hours. Patient not taking: Reported on 09/02/2016 06/23/15   Cherene Altes, MD  ipratropium-albuterol (DUONEB) 0.5-2.5 (3) MG/3ML SOLN Take 3 mLs by nebulization every 6 (six) hours as needed (for dyspnea or wheezing). Patient not taking: Reported on 02/14/2016 09/19/15   Theodis Blaze, MD    Family History Family History  Problem Relation Age of Onset  . Cancer Sister   . Diabetes Mellitus II Mother   . Tuberculosis Father     Social History Social History  Substance Use Topics  . Smoking status: Never Smoker  . Smokeless tobacco: Never Used  . Alcohol use No     Allergies   Benadryl [diphenhydramine]; Ciprofloxacin; Furosemide; Nitrofurantoin monohyd macro; Septra [bactrim]; Sulfa drugs cross reactors; and Ceclor [cefaclor]   Review of Systems Review of Systems  Constitutional: Positive for chills.  Skin: Positive for wound.  All other systems reviewed and are negative.    Physical Exam Updated Vital Signs BP (!) 127/48   Pulse 89   Temp (!) 101 F (38.3 C) (Rectal)   Resp 18   SpO2 94%   Physical Exam  Constitutional: She is oriented to person, place, and time.  Uncomfortable   HENT:  Head: Normocephalic.  MM slightly dry   Eyes: EOM are normal. Pupils are equal, round, and reactive to light.  Neck: Normal range of motion. Neck supple.  No  meningeal signs   Cardiovascular: Normal rate, regular rhythm and normal heart sounds.   Pulmonary/Chest: Effort normal and breath sounds normal. No respiratory distress. She has no wheezes.  Abdominal: Soft. Bowel sounds are normal. She exhibits no distension. There is no tenderness. There is no guarding.  Musculoskeletal: Normal range of motion.  Neurological: She is alert and oriented to person, place, and time. No cranial nerve deficit. Coordination normal.  Skin: Skin is warm.  Shingles rash R flank and RLQ that is crusted over. No signs of cellulitis. R heel ulcer no drainage or signs of cellulitis   Psychiatric: She has a normal mood and affect.  Nursing note and vitals reviewed.    ED Treatments / Results  Labs (all labs ordered are listed, but only abnormal results  are displayed) Labs Reviewed  CBC WITH DIFFERENTIAL/PLATELET - Abnormal; Notable for the following:       Result Value   RBC 3.48 (*)    Hemoglobin 10.0 (*)    HCT 30.2 (*)    RDW 16.5 (*)    Platelets 111 (*)    Lymphs Abs 0.6 (*)    All other components within normal limits  COMPREHENSIVE METABOLIC PANEL - Abnormal; Notable for the following:    CO2 20 (*)    Glucose, Bld 169 (*)    BUN 51 (*)    Creatinine, Ser 1.40 (*)    GFR calc non Af Amer 37 (*)    GFR calc Af Amer 43 (*)    All other components within normal limits  CULTURE, BLOOD (ROUTINE X 2)  CULTURE, BLOOD (ROUTINE X 2)  URINE CULTURE  URINALYSIS, ROUTINE W REFLEX MICROSCOPIC  I-STAT CG4 LACTIC ACID, ED    EKG  EKG Interpretation None       Radiology Dg Chest 2 View  Result Date: 09/02/2016 CLINICAL DATA:  Chills.  Subjective fever. EXAM: CHEST  2 VIEW COMPARISON:  Radiographs and CT 02/14/2016 FINDINGS: Stable mild cardiomegaly. Unchanged mediastinal contours. Previous bowel pleural effusions have resolved. No consolidation, pulmonary edema or pneumothorax. No acute osseous abnormalities. IMPRESSION: No acute abnormality.  Stable  cardiomegaly. Electronically Signed   By: Jeb Levering M.D.   On: 09/02/2016 22:11   Dg Foot Complete Right  Result Date: 09/02/2016 CLINICAL DATA:  Right foot ulcer. Patient reports ulcer about calcaneus for 3 weeks. Chills. EXAM: RIGHT FOOT COMPLETE - 3+ VIEW COMPARISON:  None. FINDINGS: Soft tissue defect posterior to the calcaneus consistent with soft tissue ulcer. No tracking soft tissue air. No radiopaque foreign body. No periosteal reaction or bony destructive change of the subjacent calcaneus or elsewhere throughout the foot. There are minimal Achilles tendon enthesopathic changes. No acute fracture. Diffuse bony under mineralization. Scattered degenerative change in the midfoot and throughout the digits. IMPRESSION: Soft tissue defect posterior to the calcaneus is consistent with ulcer. No radiographic evidence of osteomyelitis. Electronically Signed   By: Jeb Levering M.D.   On: 09/02/2016 22:13    Procedures Procedures (including critical care time)  Medications Ordered in ED Medications  morphine 4 MG/ML injection 4 mg (4 mg Intravenous Given 09/02/16 2226)  sodium chloride 0.9 % bolus 1,000 mL (1,000 mLs Intravenous New Bag/Given 09/02/16 2218)  acetaminophen (TYLENOL) tablet 650 mg (650 mg Oral Given 09/02/16 2226)     Initial Impression / Assessment and Plan / ED Course  I have reviewed the triage vital signs and the nursing notes.  Pertinent labs & imaging results that were available during my care of the patient were reviewed by me and considered in my medical decision making (see chart for details).     Emily Kennedy is a 71 y.o. female here with chills, flank pain. Shingles rash is crusted over and there doesn't appear to be a super imposed cellulitis. She is febrile 101 and is already on augmentin. Will do sepsis workup including, CBC, CMP, UA, Cultures, lactates, CXR, foot xray.   11:50 PM WBC nl. Lactate nl. Xray foot showed no obvious osteo. Has acute renal  failure with Cr 1.2. UA pending. Signed out to Dr. Leonides Schanz in the ED to follow up UA and reassess patient.     Final Clinical Impressions(s) / ED Diagnoses   Final diagnoses:  None    New Prescriptions New Prescriptions  No medications on file     Drenda Freeze, MD 09/02/16 2351

## 2016-09-02 NOTE — ED Notes (Signed)
At 1940, Emily Kennedy, [place] came to triage to assess patient's rash and states the rash is no longer active and pt okay to be placed into waiting room to await for room.

## 2016-09-02 NOTE — ED Triage Notes (Addendum)
Pt reports pain to the following areas: right side, right middle of back and right heel of foot. She was diagnosed with Shingles 3 weeks ago and is using the cream and anti virals and the vesicles have dried up but still reports the pain.

## 2016-09-03 LAB — URINALYSIS, ROUTINE W REFLEX MICROSCOPIC
Bilirubin Urine: NEGATIVE
GLUCOSE, UA: 50 mg/dL — AB
HGB URINE DIPSTICK: NEGATIVE
KETONES UR: NEGATIVE mg/dL
LEUKOCYTES UA: NEGATIVE
NITRITE: NEGATIVE
PROTEIN: 30 mg/dL — AB
Specific Gravity, Urine: 1.013 (ref 1.005–1.030)
Squamous Epithelial / LPF: NONE SEEN
pH: 5 (ref 5.0–8.0)

## 2016-09-03 MED ORDER — HYDROCODONE-ACETAMINOPHEN 5-325 MG PO TABS: 1.0000 | ORAL_TABLET | Freq: Four times a day (QID) | ORAL | 0 refills | Status: DC | PRN

## 2016-09-03 NOTE — Discharge Instructions (Signed)
Please continue your Augmentin until complete. We are discharging you with hydrocodone to help with your pain from your shingles. Your labs, chest x-ray, urine today were normal. I recommend close follow with her primary care physician next week.

## 2016-09-04 LAB — URINE CULTURE: Culture: NO GROWTH

## 2016-09-08 LAB — CULTURE, BLOOD (ROUTINE X 2)
Culture: NO GROWTH
Culture: NO GROWTH
SPECIAL REQUESTS: ADEQUATE
SPECIAL REQUESTS: ADEQUATE

## 2016-10-27 ENCOUNTER — Emergency Department (HOSPITAL_COMMUNITY)
Admission: EM | Admit: 2016-10-27 | Discharge: 2016-10-27 | Disposition: A | Discharge status: Home / Self Care | Payer: Medicare Other | Attending: Emergency Medicine | Admitting: Emergency Medicine

## 2016-10-27 ENCOUNTER — Encounter (HOSPITAL_COMMUNITY): Payer: Self-pay | Admitting: *Deleted

## 2016-10-27 ENCOUNTER — Emergency Department (HOSPITAL_COMMUNITY): Payer: Medicare Other

## 2016-10-27 DIAGNOSIS — E039 Hypothyroidism, unspecified: Secondary | ICD-10-CM | POA: Insufficient documentation

## 2016-10-27 DIAGNOSIS — E119 Type 2 diabetes mellitus without complications: Secondary | ICD-10-CM | POA: Diagnosis not present

## 2016-10-27 DIAGNOSIS — I110 Hypertensive heart disease with heart failure: Secondary | ICD-10-CM | POA: Insufficient documentation

## 2016-10-27 DIAGNOSIS — Z79899 Other long term (current) drug therapy: Secondary | ICD-10-CM | POA: Insufficient documentation

## 2016-10-27 DIAGNOSIS — I5033 Acute on chronic diastolic (congestive) heart failure: Secondary | ICD-10-CM | POA: Diagnosis not present

## 2016-10-27 DIAGNOSIS — Z8679 Personal history of other diseases of the circulatory system: Secondary | ICD-10-CM

## 2016-10-27 DIAGNOSIS — R079 Chest pain, unspecified: Secondary | ICD-10-CM | POA: Insufficient documentation

## 2016-10-27 DIAGNOSIS — R0602 Shortness of breath: Secondary | ICD-10-CM | POA: Diagnosis present

## 2016-10-27 LAB — I-STAT TROPONIN, ED: Troponin i, poc: 0.01 ng/mL (ref 0.00–0.08)

## 2016-10-27 LAB — BASIC METABOLIC PANEL
Anion gap: 8 (ref 5–15)
BUN: 45 mg/dL — AB (ref 6–20)
CO2: 24 mmol/L (ref 22–32)
Calcium: 9.6 mg/dL (ref 8.9–10.3)
Chloride: 106 mmol/L (ref 101–111)
Creatinine, Ser: 1.33 mg/dL — ABNORMAL HIGH (ref 0.44–1.00)
GFR calc Af Amer: 46 mL/min — ABNORMAL LOW (ref >60)
GFR, EST NON AFRICAN AMERICAN: 39 mL/min — AB (ref >60)
GLUCOSE: 240 mg/dL — AB (ref 65–99)
Potassium: 5.2 mmol/L — ABNORMAL HIGH (ref 3.5–5.1)
SODIUM: 138 mmol/L (ref 135–145)

## 2016-10-27 LAB — HEPATIC FUNCTION PANEL
ALK PHOS: 49 U/L (ref 38–126)
ALT: 21 U/L (ref 14–54)
AST: 18 U/L (ref 15–41)
Albumin: 3.2 g/dL — ABNORMAL LOW (ref 3.5–5.0)
TOTAL PROTEIN: 6.3 g/dL — AB (ref 6.5–8.1)
Total Bilirubin: 0.8 mg/dL (ref 0.3–1.2)

## 2016-10-27 LAB — URINALYSIS, ROUTINE W REFLEX MICROSCOPIC
Bilirubin Urine: NEGATIVE
GLUCOSE, UA: NEGATIVE mg/dL
HGB URINE DIPSTICK: NEGATIVE
Ketones, ur: NEGATIVE mg/dL
NITRITE: NEGATIVE
PH: 5 (ref 5.0–8.0)
Protein, ur: NEGATIVE mg/dL
Specific Gravity, Urine: 1.01 (ref 1.005–1.030)

## 2016-10-27 LAB — CBC
HCT: 28.8 % — ABNORMAL LOW (ref 36.0–46.0)
Hemoglobin: 9.6 g/dL — ABNORMAL LOW (ref 12.0–15.0)
MCH: 29.7 pg (ref 26.0–34.0)
MCHC: 33.3 g/dL (ref 30.0–36.0)
MCV: 89.2 fL (ref 78.0–100.0)
PLATELETS: 124 10*3/uL — AB (ref 150–400)
RBC: 3.23 MIL/uL — ABNORMAL LOW (ref 3.87–5.11)
RDW: 13.4 % (ref 11.5–15.5)
WBC: 3.6 10*3/uL — AB (ref 4.0–10.5)

## 2016-10-27 LAB — BRAIN NATRIURETIC PEPTIDE: B Natriuretic Peptide: 72.8 pg/mL (ref 0.0–100.0)

## 2016-10-27 LAB — D-DIMER, QUANTITATIVE (NOT AT ARMC): D DIMER QUANT: 0.37 ug{FEU}/mL (ref 0.00–0.50)

## 2016-10-27 NOTE — ED Notes (Signed)
Pt to xray

## 2016-10-27 NOTE — ED Provider Notes (Signed)
[facility] DEPT Provider Note   CSN: [id number] Arrival date & time: 10/27/16  1027     History   Chief Complaint Chief Complaint  Patient presents with  . Shortness of Breath  . Chest Pain    HPI Emily Kennedy is a 71 y.o. female.  HPI Patient has been feeling periodically short of breath for about a week. She reports is most noticeable after she's been up in the morning and had her breakfast. She does not report feeling short of breath with lying flat in bed or awakening with shortness of breath. Patient usually moves around her home with her walker. She reports she gets up and down quite frequently. She occasionally does some light-housework. Patient reports that her baseline activities are unchanged. She has not decreased activity due to shortness of breath. No fever, no cough. Patient reports she always has some "sinus". No abdominal pain nausea or vomiting. Patient is eating regularly. She eats breakfast in the morning. She may eat small meals but takes Glucerna and protein bars. Family does not no change in appetite or eating patterns. Patient notes only other thing that she has notices that her stool seems pale in color. She denies abdominal pain or bloating. She denies pain or burning with urination. Past Medical History:  Diagnosis Date  . CHF (congestive heart failure) ([place])   . Diabetes mellitus   . Hypertension   . Thyroid disease   . Vasculitis [facility])     Patient Active Problem List   Diagnosis Date Noted  . Solitary pulmonary nodule 02/15/2016  . CHF exacerbation ([place]) 02/14/2016  . CHF (congestive heart failure) ([place]) 02/14/2016  . Malnutrition of moderate degree 09/18/2015  . Benign essential HTN   . Vasculitis ([place])   . Rheumatoid arthritis ([place])   . History of recent hospitalization   . Acute blood loss anemia   . Anemia of chronic disease   . Hyponatremia 09/15/2015  . HCAP (healthcare-associated pneumonia) 09/14/2015  . Respiratory arrest ([place])  intubated 4/13 post arrest 06/27/2015  . Cardiac arrest ([place]) 4/13 suspected mucus plugging as cause of arrest 06/27/2015  . Controlled diabetes mellitus type 2 with complications ([place])   . Dysphagia   . Atrial fibrillation with RVR ([place]) 06/18/2015  . Acute on chronic diastolic CHF (congestive heart failure), NYHA class 3 ([place])   . Acute encephalopathy 06/14/2015  . Anemia, chronic disease 05/18/2015  . Hypothyroidism 05/18/2015  . Essential hypertension 05/18/2015  . Diabetes mellitus type 2, uncontrolled ([place]) 04/09/2015  . 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 MELANOMA EXCISION    . TUBAL LIGATION      OB History    No data available       Home Medications    Prior to Admission medications   Medication Sig Start Date End Date Taking? Authorizing Provider  acetaminophen (TYLENOL) 325 MG tablet Take 2 tablets (650 mg total) by mouth every 6 (six) hours as needed for fever. Patient taking differently: Take 650 mg by mouth daily.  09/19/15   Theodis Blaze, MD  amoxicillin-clavulanate (AUGMENTIN) 500-125 MG tablet Take 1 tablet by mouth 2 (two) times daily.    [provider]  Calcium-Magnesium-Vitamin D (CALCIUM 500 PO) Take 500 mg by mouth every morning.    [provider]  carvedilol (COREG) 25 MG tablet Take 1 tablet (25 mg total) by mouth 2 (two) times daily. 09/19/15   Mart Piggs  M, MD  cholecalciferol (VITAMIN D) 400 units TABS tablet Take 400 Units by mouth daily.    [provider]  diltiazem (CARDIZEM) 90 MG tablet Take 90 mg by mouth 3 (three) times daily. Midnight, 6am, noon, 6pm     [provider]  ferrous sulfate 325 (65 FE) MG tablet Take 325 mg by mouth 2 (two) times daily with a meal.    [provider]  gabapentin (NEURONTIN) 100 MG capsule Take 100 mg by mouth 2 (two) times daily.     [provider]  GLUCERNA (GLUCERNA) LIQD  Take 237 mLs by mouth daily.     [provider]  glucosamine-chondroitin 500-400 MG tablet Take 2 tablets by mouth daily.     [provider]  hydrALAZINE (APRESOLINE) 100 MG tablet Take 100 mg by mouth 3 (three) times daily.    [provider]  HYDROcodone-acetaminophen (NORCO/VICODIN) 5-325 MG tablet Take 1-2 tablets by mouth every 6 (six) hours as needed. 09/03/16   Ward, Delice Bison, DO  levothyroxine (SYNTHROID, LEVOTHROID) 112 MCG tablet Take 224 mcg by mouth daily before breakfast.     [provider]  lisinopril (PRINIVIL,ZESTRIL) 40 MG tablet Take 40 mg by mouth 2 (two) times daily.     [provider]  Multiple Vitamin (MULTIVITAMIN) tablet Take 1 tablet by mouth daily.    [provider]  Omega-3 Fatty Acids (FISH OIL) 1000 MG CAPS Take 4,000 mg by mouth daily.    [provider]  torsemide (DEMADEX) 10 MG tablet Take 2 tablets (20 mg total) by mouth daily. 02/16/16   Domenic Polite, MD  vitamin B-12 (CYANOCOBALAMIN) 500 MCG tablet Take 500 mcg by mouth daily.    [provider]    Family History Family History  Problem Relation Age of Onset  . Cancer Sister   . Diabetes Mellitus II Mother   . Tuberculosis Father     Social History Social History  Substance Use Topics  . Smoking status: Never Smoker  . Smokeless tobacco: Never Used  . Alcohol use No     Allergies   Benadryl [diphenhydramine]; Ciprofloxacin; Furosemide; Nitrofurantoin monohyd macro; Septra [bactrim]; Sulfa drugs cross reactors; and Ceclor [cefaclor]   Review of Systems Review of Systems 10 Systems reviewed and are negative for acute change except as noted in the HPI.   Physical Exam Updated Vital Signs BP (!) 142/59   Pulse 65   Temp 98.1 F (36.7 C) (Oral)   Resp 13   Ht 5\' 4"  (1.626 m)   Wt 47.6 kg (105 lb)   SpO2 100%   BMI 18.02 kg/m   Physical Exam  Constitutional: She appears well-developed and well-nourished. No  distress.  HENT:  Head: Normocephalic and atraumatic.  Mouth/Throat: Oropharynx is clear and moist.  Eyes: Conjunctivae and EOM are normal.  Neck: Neck supple. No JVD present.  Cardiovascular: Normal rate, regular rhythm, normal heart sounds and intact distal pulses.   No murmur heard. Pulmonary/Chest: Effort normal and breath sounds normal. No respiratory distress.  Abdominal: Soft. There is no tenderness.  Musculoskeletal: She exhibits no edema.  Neurological: She is alert.  Skin: Skin is warm and dry.  Psychiatric: She has a normal mood and affect.  Nursing note and vitals reviewed.    ED Treatments / Results  Labs (all labs ordered are listed, but only abnormal results are displayed) Labs Reviewed  URINE CULTURE - Abnormal; Notable for the following:       Result  Value   Culture <10,000 COLONIES/mL (*)    All other components within normal limits  BASIC METABOLIC PANEL - Abnormal; Notable for the following:    Potassium 5.2 (*)    Glucose, Bld 240 (*)    BUN 45 (*)    Creatinine, Ser 1.33 (*)    GFR calc non Af Amer 39 (*)    GFR calc Af Amer 46 (*)    All other components within normal limits  CBC - Abnormal; Notable for the following:    WBC 3.6 (*)    RBC 3.23 (*)    Hemoglobin 9.6 (*)    HCT 28.8 (*)    Platelets 124 (*)    All other components within normal limits  HEPATIC FUNCTION PANEL - Abnormal; Notable for the following:    Total Protein 6.3 (*)    Albumin 3.2 (*)    Bilirubin, Direct <0.1 (*)    All other components within normal limits  URINALYSIS, ROUTINE W REFLEX MICROSCOPIC - Abnormal; Notable for the following:    APPearance HAZY (*)    Leukocytes, UA MODERATE (*)    Bacteria, UA RARE (*)    Squamous Epithelial / LPF 0-5 (*)    All other components within normal limits  D-DIMER, QUANTITATIVE (NOT AT [facility])  BRAIN NATRIURETIC PEPTIDE  I-STAT TROPONIN, ED    EKG  EKG Interpretation  Date/Time:  Wednesday October 27 2016 10:47:19  EDT Ventricular Rate:  66 PR Interval:  176 QRS Duration: 94 QT Interval:  400 QTC Calculation: 419 R Axis:   82 Text Interpretation:  Normal sinus rhythm Nonspecific ST abnormality Abnormal ECG Confirmed by Charlesetta Shanks [phone]) on 10/27/2016 2:33:07 PM       Radiology No results found.  Procedures Procedures (including critical care time)  Medications Ordered in ED Medications - No data to display   Initial Impression / Assessment and Plan / ED Course  I have reviewed the triage vital signs and the nursing notes.  Pertinent labs & imaging results that were available during my care of the patient were reviewed by me and considered in my medical decision making (see chart for details).     Final Clinical Impressions(s) / ED Diagnoses   Final diagnoses:  Shortness of breath  History of congestive heart failure   Patient has been having episodes of dyspnea which she finds most noticeable shortly after she's been awake for a while. She is not experiencing typical CHF symptoms. BNP is not elevated and chest x-ray does not show CHF. She is not expressing chest pain or anginal equivalent type symptoms. Prone and is normal and EKG does not show ischemic change. D-dimer is not elevated to suggest PE. At this time, I do feel patient stable for continued outpatient evaluation with her primary care doctor. Patient is counseled on signs and symptoms first return. New Prescriptions Discharge Medication List as of 10/27/2016  2:23 PM       Charlesetta Shanks, MD 10/31/16 [phone]

## 2016-10-27 NOTE — ED Notes (Signed)
PT in xray 

## 2016-10-27 NOTE — ED Notes (Signed)
Pt attempting to urinate.

## 2016-10-27 NOTE — ED Notes (Signed)
Pt returned from xray

## 2016-10-27 NOTE — ED Notes (Signed)
Warm blankets given; tucked in; no needs; updated on POC.

## 2016-10-27 NOTE — ED Notes (Signed)
Pt cannot urinate at this time

## 2016-10-27 NOTE — Discharge Instructions (Signed)
1. Call your physician's office today to let them know you need a repeat of your basic chemistry panel with potassium on Friday. 2. You should have a recheck at her doctor's office within 2-4 days. 3. Your potassium level was just to the high side of normal. This is likely due to your torsemide. Please decrease your torsemide dose by half for the next 2 days and then have recheck of the labs as described above. 4. Return to the emergency department if your symptoms are getting any worse, new symptoms or developing or you have other concerns.

## 2016-10-27 NOTE — ED Triage Notes (Signed)
Pt states low bp readings at home accompanied with intermittent chest heaviness, sob.  Also states light colored stools.

## 2016-10-28 LAB — URINE CULTURE

## 2017-01-21 ENCOUNTER — Other Ambulatory Visit: Payer: Self-pay | Admitting: Internal Medicine

## 2017-01-21 DIAGNOSIS — R1031 Right lower quadrant pain: Secondary | ICD-10-CM

## 2017-01-28 ENCOUNTER — Ambulatory Visit
Admission: RE | Admit: 2017-01-28 | Discharge: 2017-01-28 | Disposition: A | Discharge status: Home / Self Care | Payer: Medicare Other | Source: Ambulatory Visit | Attending: Internal Medicine | Admitting: Internal Medicine

## 2017-01-28 DIAGNOSIS — R1031 Right lower quadrant pain: Secondary | ICD-10-CM

## 2017-01-28 MED ORDER — IOPAMIDOL (ISOVUE-300) INJECTION 61%
100.0000 mL | Freq: Once | INTRAVENOUS | Status: AC | PRN
  Administered 2017-01-28: 100 mL via INTRAVENOUS

## 2017-05-26 ENCOUNTER — Telehealth: Payer: Self-pay | Admitting: Hematology and Oncology

## 2017-05-26 NOTE — Telephone Encounter (Signed)
Appt has been scheduled for the pt to see Dr. Lebron Conners on 3/27 at 11am. Appt date and time given to the pt's daughter.

## 2017-06-08 ENCOUNTER — Encounter: Payer: Self-pay | Admitting: Hematology and Oncology

## 2017-06-08 ENCOUNTER — Inpatient Hospital Stay: Payer: Medicare Other

## 2017-06-08 ENCOUNTER — Inpatient Hospital Stay: Payer: Medicare Other | Attending: Hematology and Oncology | Admitting: Hematology and Oncology

## 2017-06-08 ENCOUNTER — Telehealth: Payer: Self-pay | Admitting: Hematology and Oncology

## 2017-06-08 VITALS — BP 134/57 | HR 63 | Temp 97.9°F | Resp 18 | Ht 64.0 in | Wt 131.2 lb

## 2017-06-08 DIAGNOSIS — D61818 Other pancytopenia: Secondary | ICD-10-CM | POA: Diagnosis not present

## 2017-06-08 DIAGNOSIS — I509 Heart failure, unspecified: Secondary | ICD-10-CM | POA: Insufficient documentation

## 2017-06-08 DIAGNOSIS — E119 Type 2 diabetes mellitus without complications: Secondary | ICD-10-CM | POA: Diagnosis not present

## 2017-06-08 DIAGNOSIS — Z79899 Other long term (current) drug therapy: Secondary | ICD-10-CM | POA: Diagnosis not present

## 2017-06-08 DIAGNOSIS — M069 Rheumatoid arthritis, unspecified: Secondary | ICD-10-CM | POA: Diagnosis not present

## 2017-06-08 DIAGNOSIS — E039 Hypothyroidism, unspecified: Secondary | ICD-10-CM | POA: Diagnosis not present

## 2017-06-08 DIAGNOSIS — M310 Hypersensitivity angiitis: Secondary | ICD-10-CM | POA: Insufficient documentation

## 2017-06-08 DIAGNOSIS — I110 Hypertensive heart disease with heart failure: Secondary | ICD-10-CM | POA: Insufficient documentation

## 2017-06-08 DIAGNOSIS — D472 Monoclonal gammopathy: Secondary | ICD-10-CM | POA: Diagnosis not present

## 2017-06-08 DIAGNOSIS — E785 Hyperlipidemia, unspecified: Secondary | ICD-10-CM | POA: Insufficient documentation

## 2017-06-08 LAB — CMP (CANCER CENTER ONLY)
ALK PHOS: 64 U/L (ref 40–150)
ALT: 17 U/L (ref 0–55)
AST: 18 U/L (ref 5–34)
Albumin: 2.5 g/dL — ABNORMAL LOW (ref 3.5–5.0)
Anion gap: 5 (ref 3–11)
BILIRUBIN TOTAL: 0.3 mg/dL (ref 0.2–1.2)
BUN: 27 mg/dL — AB (ref 7–26)
CALCIUM: 9.1 mg/dL (ref 8.4–10.4)
CHLORIDE: 111 mmol/L — AB (ref 98–109)
CO2: 20 mmol/L — ABNORMAL LOW (ref 22–29)
CREATININE: 1.3 mg/dL — AB (ref 0.60–1.10)
GFR, EST AFRICAN AMERICAN: 47 mL/min — AB (ref >60)
GFR, EST NON AFRICAN AMERICAN: 41 mL/min — AB (ref >60)
Glucose, Bld: 130 mg/dL (ref 70–140)
Potassium: 5 mmol/L (ref 3.5–5.1)
Sodium: 136 mmol/L (ref 136–145)
TOTAL PROTEIN: 6 g/dL — AB (ref 6.4–8.3)

## 2017-06-08 LAB — CBC WITH DIFFERENTIAL (CANCER CENTER ONLY)
BASOS PCT: 1 %
Basophils Absolute: 0 10*3/uL (ref 0.0–0.1)
EOS ABS: 0.1 10*3/uL (ref 0.0–0.5)
EOS PCT: 3 %
HCT: 25.1 % — ABNORMAL LOW (ref 34.8–46.6)
Hemoglobin: 8.1 g/dL — ABNORMAL LOW (ref 11.6–15.9)
LYMPHS ABS: 0.2 10*3/uL — AB (ref 0.9–3.3)
Lymphocytes Relative: 8 %
MCH: 27.6 pg (ref 25.1–34.0)
MCHC: 32.4 g/dL (ref 31.5–36.0)
MCV: 85.4 fL (ref 79.5–101.0)
Monocytes Absolute: 0.2 10*3/uL (ref 0.1–0.9)
Monocytes Relative: 7 %
NEUTROS PCT: 81 %
Neutro Abs: 2.3 10*3/uL (ref 1.5–6.5)
PLATELETS: 106 10*3/uL — AB (ref 145–400)
RBC: 2.94 MIL/uL — AB (ref 3.70–5.45)
RDW: 16.5 % — ABNORMAL HIGH (ref 11.2–14.5)
WBC: 2.8 10*3/uL — AB (ref 3.9–10.3)

## 2017-06-08 LAB — IRON AND TIBC
IRON: 24 ug/dL — AB (ref 41–142)
Saturation Ratios: 10 % — ABNORMAL LOW (ref 21–57)
TIBC: 233 ug/dL — ABNORMAL LOW (ref 236–444)
UIBC: 209 ug/dL

## 2017-06-08 LAB — FERRITIN: Ferritin: 73 ng/mL (ref 9–269)

## 2017-06-08 LAB — LACTATE DEHYDROGENASE: LDH: 200 U/L (ref 125–245)

## 2017-06-08 LAB — FOLATE: FOLATE: 43.8 ng/mL (ref >5.9)

## 2017-06-08 LAB — C-REACTIVE PROTEIN: CRP: 0.8 mg/dL (ref <1.0)

## 2017-06-08 LAB — TSH: TSH: 1.997 u[IU]/mL (ref 0.308–3.960)

## 2017-06-08 LAB — VITAMIN B12: VITAMIN B 12: 396 pg/mL (ref 180–914)

## 2017-06-08 LAB — SEDIMENTATION RATE: SED RATE: 134 mm/h — AB (ref 0–22)

## 2017-06-08 NOTE — Telephone Encounter (Signed)
Scheduled appt per 3/27 los- Gave patient AVS and calender per los.  

## 2017-06-09 ENCOUNTER — Encounter: Payer: Self-pay | Admitting: Hematology and Oncology

## 2017-06-09 LAB — PATHOLOGIST SMEAR REVIEW

## 2017-06-09 LAB — RHEUMATOID FACTOR: RHEUMATOID FACTOR: 151.2 [IU]/mL — AB (ref 0.0–13.9)

## 2017-06-09 NOTE — Assessment & Plan Note (Signed)
72 y.o. 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  On review, patient's blood counts appear to have dropped significantly in November 2018, but remained  stable since then.  Differential here is broad and complex considering presence of a complex autoimmune process and previous history of monoclonal gammopathy.  Plan: - Labs today as outlined below. -Consult interventional radiology for bone marrow biopsy. -Return to clinic 2 weeks following bone marrow biopsy

## 2017-06-09 NOTE — Progress Notes (Signed)
[name]:  Assessment: Other pancytopenia ([place]) 72 y.o. 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  On review, patient's blood counts appear to have dropped significantly in November 2018, but remained  stable since then.  Differential here is broad and complex considering presence of a complex autoimmune process and previous history of monoclonal gammopathy.  Plan: - Labs today as outlined below. -Consult interventional radiology for bone marrow biopsy. -Return to clinic 2 weeks following bone marrow biopsy   Voice recognition software was used and creation of this note. Despite my best effort at editing the text, some misspelling/errors may have occurred. Orders Placed This Encounter  Procedures  . CT BONE MARROW BIOPSY & ASPIRATION    Standing Status:   Future    Standing Expiration Date:   09/09/2018    Order Specific Question:   Reason for Exam (SYMPTOM  OR DIAGNOSIS REQUIRED)    Answer:   pancytopenia, eval for MDS/AML    Order Specific Question:   Preferred imaging location?    Answer:   [facility]    Order Specific Question:   Radiology Contrast Protocol - do NOT remove file path    Answer:   \\charchive\epicdata\Radiant\CTProtocols.pdf  . CT BIOPSY    Order Specific Question:   Lab orders requested (DO NOT place separate lab orders, these will be automatically ordered during procedure specimen collection):    Answer:   Other    Comments:   pathology, flow cytometry, cytogeentics, MDS FISH    Order Specific Question:   Reason for Exam (SYMPTOM  OR DIAGNOSIS REQUIRED)    Answer:   pancytopenia, eval for MDS/AML    Order Specific Question:   Preferred imaging location?    Answer:   [facility]    Order Specific Question:   Radiology Contrast Protocol - do NOT remove file path    Answer:    \\[email or web address]\Radiant\CTProtocols.pdf  . CBC with Differential ([place] Only)    Standing Status:   Future    Number of Occurrences:   1    Standing Expiration Date:   06/09/2018  . CMP ([place] only)    Standing Status:   Future    Number of Occurrences:   1    Standing Expiration Date:   06/09/2018  . Lactate dehydrogenase (LDH)    Standing Status:   Future    Number of Occurrences:   1    Standing Expiration Date:   06/08/2018  . C-reactive protein  . Sedimentation rate  . ANA, IFA (with reflex)    Standing Status:   Future    Number of Occurrences:   1    Standing Expiration Date:   06/08/2018  . Rheumatoid factor    Standing Status:   Future    Number of Occurrences:   1    Standing Expiration Date:   06/08/2018  . Pathologist smear review    Standing Status:   Future    Number of Occurrences:   1    Standing Expiration Date:   06/08/2018  . Iron and TIBC    Standing Status:   Future    Number of Occurrences:   1    Standing Expiration Date:   06/09/2018  . Ferritin    Standing Status:   Future    Number of Occurrences:   1    Standing Expiration Date:  06/09/2018  . TSH    Standing Status:   Future    Number of Occurrences:   1    Standing Expiration Date:   06/09/2018  . Vitamin B12    Standing Status:   Future    Number of Occurrences:   1    Standing Expiration Date:   06/08/2018  . Folate, Serum    Standing Status:   Future    Number of Occurrences:   1    Standing Expiration Date:   06/08/2018  . Methylmalonic acid, serum    Standing Status:   Future    Number of Occurrences:   1    Standing Expiration Date:   06/08/2018    All questions were answered.   . The patient knows to call the clinic with any problems, questions or concerns.  This note was electronically signed.    History of Presenting Illness Emily Kennedy 72 y.o. presenting to the [facility] for pancytopenia evaluation, referred by Dr [name].  Patient's past medical  history is significant for rheumatoid arthritis, leukocytoclastic vasculitis, iron deficiency anemia, IgM monoclonal gammopathy, splenomegaly, hypothyroidism, hyperlipidemia, and diabetes mellitus.  Patient is currently receiving prednisone, ferrous sulfate twice daily and vitamin B12 supplementation.  She has no history of tobacco or alcohol abuse.  Patient reports symptoms of fatigue, but denies any fevers, chills, night sweats.  Denies any significant weight loss, but appetite has been poor for an extended period of time.  Stable pain in joints associated with her arthritis diagnosis, stable swelling in bilateral lower extremities.  No recent rash or skin ulcerations.  No oral sores or swollen lymph nodes.  No easy bruisability.  Oncological/hematological History: --Labs, 01/17/17: tProt 6.0, Alb 2.4, Ca 9.0, Cr 1.1, AP 98; WBC 2.5, Hgb 8.9, MCV 92.6, MCH 30.0, MCHC 32.4, RDW 13.3, Plt 127; Ferritin 83.0 --CT A/P, 01/28/17: Dilated portal vein and splenic vein with splenomegaly and mild ascites in the abdomen and pelvis. Findings suggestive of portal venous hypertension possibly related to cirrhosis. Question subtle nodularity of the liver. Moderate pericardial effusion. Small bilateral pleural effusions, left greater than right. --Labs, 05/10/17: tProt 6.1, Alb 2.5, Ca 9.1, Cr 1.2, AP 91; WBC 2.3, Hgb 8.1, MCV 91.5, MCH 30.0, MCHC 32.8, RDW 14.5, Plt 122    Medical History: Past Medical History:  Diagnosis Date  . CHF (congestive heart failure) ([place])   . Diabetes mellitus   . Hypertension   . Thyroid disease   . Vasculitis [facility])     Surgical History: Past Surgical History:  Procedure Laterality Date  . MELANOMA EXCISION    . TUBAL LIGATION      Family History: Family History  Problem Relation Age of Onset  . Cancer Sister   . Diabetes Mellitus II Mother   . Tuberculosis Father     Social History: Social History   Socioeconomic History  . Marital status: Divorced    Spouse  name: Not on file  . Number of children: Not on file  . Years of education: Not on file  . Highest education level: Not on file  Occupational History  . Occupation: Retired  Scientific laboratory technician  . Financial resource strain: Not on file  . Food insecurity:    Worry: Not on file    Inability: Not on file  . Transportation needs:    Medical: Not on file    Non-medical: Not on file  Tobacco Use  . Smoking status: Never Smoker  . Smokeless tobacco:  Never Used  Substance and Sexual Activity  . Alcohol use: No  . Drug use: No  . Sexual activity: Not on file  Lifestyle  . Physical activity:    Days per week: Not on file    Minutes per session: Not on file  . Stress: Not on file  Relationships  . Social connections:    Talks on phone: Not on file    Gets together: Not on file    Attends religious service: Not on file    Active member of club or organization: Not on file    Attends meetings of clubs or organizations: Not on file    Relationship status: Not on file  . Intimate partner violence:    Fear of current or ex partner: Not on file    Emotionally abused: Not on file    Physically abused: Not on file    Forced sexual activity: Not on file  Other Topics Concern  . Not on file  Social History Narrative  . Not on file    Allergies: Allergies  Allergen Reactions  . Benadryl [Diphenhydramine] Swelling  . Ciprofloxacin Hives  . Furosemide Itching, Swelling, Rash and Other (See Comments)    Made edema worse  . Nitrofurantoin Monohyd Macro Hives  . Septra [Bactrim] Hives  . Sulfa Drugs Cross Reactors Hives  . Ceclor [Cefaclor] Rash    Breaks out in rash chest down.    Medications:  Current Outpatient Medications  Medication Sig Dispense Refill  . acetaminophen (TYLENOL) 325 MG tablet Take 2 tablets (650 mg total) by mouth every 6 (six) hours as needed for fever. (Patient taking differently: Take 650 mg by mouth daily. )    . amLODipine (NORVASC) 2.5 MG tablet Take 2.5 mg  by mouth daily.    Marland Kitchen amoxicillin-clavulanate (AUGMENTIN) 500-125 MG tablet Take 1 tablet by mouth as needed.     . Calcium-Magnesium-Vitamin D (CALCIUM 500 PO) Take 500 mg by mouth every morning.    . carvedilol (COREG) 25 MG tablet Take 1 tablet (25 mg total) by mouth 2 (two) times daily.    . cholecalciferol (VITAMIN D) 400 units TABS tablet Take 400 Units by mouth daily.    Marland Kitchen diltiazem (CARDIZEM) 90 MG tablet Take 90 mg by mouth 3 (three) times daily. Midnight, 6am, noon, 6pm     . DULoxetine (CYMBALTA) 30 MG capsule Take 30 mg by mouth daily.    . ferrous sulfate 325 (65 FE) MG tablet Take 325 mg by mouth 2 (two) times daily with a meal.    . gabapentin (NEURONTIN) 100 MG capsule Take 100 mg by mouth 2 (two) times daily.     Marland Kitchen GLUCERNA (GLUCERNA) LIQD Take 237 mLs by mouth daily.     Marland Kitchen glucosamine-chondroitin 500-400 MG tablet Take 2 tablets by mouth daily.     . hydrALAZINE (APRESOLINE) 100 MG tablet Take 100 mg by mouth 3 (three) times daily.    Marland Kitchen HYDROcodone-acetaminophen (NORCO/VICODIN) 5-325 MG tablet Take 1-2 tablets by mouth every 6 (six) hours as needed. 20 tablet 0  . levothyroxine (SYNTHROID, LEVOTHROID) 112 MCG tablet Take 224 mcg by mouth daily before breakfast.     . lisinopril (PRINIVIL,ZESTRIL) 40 MG tablet Take 40 mg by mouth 2 (two) times daily.     . Multiple Vitamin (MULTIVITAMIN) tablet Take 1 tablet by mouth daily.    . Omega-3 Fatty Acids (FISH OIL) 1000 MG CAPS Take 4,000 mg by mouth daily.    Marland Kitchen torsemide (DEMADEX)  10 MG tablet Take 2 tablets (20 mg total) by mouth daily. 30 tablet 0  . vitamin B-12 (CYANOCOBALAMIN) 500 MCG tablet Take 500 mcg by mouth daily.     No current facility-administered medications for this visit.     Review of Systems: Review of Systems  Constitutional: Positive for fatigue. Negative for chills, diaphoresis, fever and unexpected weight change.  Cardiovascular: Positive for leg swelling.  All other systems reviewed and are  negative.    PHYSICAL EXAMINATION Blood pressure (!) 134/57, pulse 63, temperature 97.9 F (36.6 C), temperature source Oral, resp. rate 18, height 5' 4"  (1.626 m), weight 131 lb 3.2 oz (59.5 kg), SpO2 97 %.  ECOG PERFORMANCE STATUS: 2 - Symptomatic, <50% confined to bed  Physical Exam  Constitutional: She is oriented to person, place, and time. No distress.  Frail elderly female  HENT:  Head: Normocephalic and atraumatic.  Mouth/Throat: Oropharynx is clear and moist. No oropharyngeal exudate.  Eyes: Pupils are equal, round, and reactive to light. Conjunctivae and EOM are normal. No scleral icterus.  Neck: No thyromegaly present.  Cardiovascular: Normal rate, regular rhythm, normal heart sounds and intact distal pulses.  No murmur heard. Pulmonary/Chest: Effort normal and breath sounds normal. No respiratory distress. She has no wheezes. She has no rales.  Abdominal: Soft. Bowel sounds are normal. She exhibits no distension and no mass. There is no tenderness. There is no guarding.  Musculoskeletal: She exhibits no edema.  Lymphadenopathy:    She has no cervical adenopathy.  Neurological: She is alert and oriented to person, place, and time. She has normal reflexes. No cranial nerve deficit.  Skin: Skin is warm and dry. No rash noted. She is not diaphoretic. No erythema. No pallor.     LABORATORY DATA: I have personally reviewed the data as listed: Appointment on 06/08/2017  Component Date Value Ref Range Status  . Folate 06/08/2017 43.8  >5.9 ng/mL Final   Comment: RESULTS CONFIRMED BY MANUAL DILUTION Performed at [facility], [place] [address] [place], [place] [address]   . Vitamin B-12 06/08/2017 396  180 - 914 pg/mL Final   Comment: (NOTE) This assay is not validated for testing neonatal or myeloproliferative syndrome specimens for Vitamin B12 levels. Performed at [facility], [place] [address] [place], [place] [address]   . TSH 06/08/2017 1.997  0.308 -  3.960 uIU/mL Final   Performed at [facility], [place] [address] [place], [place] [address]  . Ferritin 06/08/2017 73  9 - 269 ng/mL Final   Performed at [facility], [place] [address], [place], [place] [address]  . Iron 06/08/2017 24* 41 - 142 ug/dL Final  . TIBC 06/08/2017 233* 236 - 444 ug/dL Final  . Saturation Ratios 06/08/2017 10* 21 - 57 % Final  . UIBC 06/08/2017 209  ug/dL Final   Performed at [facility], [place] [address], [place], [place] [address]  . Path Review 06/08/2017 Reviewed By Violet Baldy, M.D.   Final   Comment: 3.28.19 PANCYTOPENIA Performed at [facility], [place] [address], [place], [place] [address]   . Rhuematoid fact SerPl-aCnc 06/08/2017 151.2* 0.0 - 13.9 IU/mL Final   Comment: (NOTE) Results confirmed on dilution. Performed At: [facility] [place], [place] [id number] Rush Farmer MD JO:[id number] Performed at [facility], [place] [address] [place],  [address]   . LDH 06/08/2017 200  125 - 245  U/L Final   Performed at [facility], [place] [address], [place], [place] [address]  . Sodium 06/08/2017 136  136 - 145 mmol/L Final  . Potassium 06/08/2017 5.0  3.5 - 5.1 mmol/L Final  . Chloride 06/08/2017 111* 98 - 109 mmol/L Final  . CO2 06/08/2017 20* 22 - 29 mmol/L Final  . Glucose, Bld 06/08/2017 130  70 - 140 mg/dL Final  . BUN 06/08/2017 27* 7 - 26 mg/dL Final  . Creatinine 06/08/2017 1.30* 0.60 - 1.10 mg/dL Final  . Calcium 06/08/2017 9.1  8.4 - 10.4 mg/dL Final  . Total Protein 06/08/2017 6.0* 6.4 - 8.3 g/dL Final  . Albumin 06/08/2017 2.5* 3.5 - 5.0 g/dL Final  . AST 06/08/2017 18  5 - 34 U/L Final  . ALT 06/08/2017 17  0 - 55 U/L Final  . Alkaline Phosphatase 06/08/2017 64  40 - 150 U/L Final  . Total Bilirubin 06/08/2017 0.3  0.2 - 1.2 mg/dL Final  . GFR, Est Non Af Am  06/08/2017 41* >60 mL/min Final  . GFR, Est AFR Am 06/08/2017 47* >60 mL/min Final   Comment: (NOTE) The eGFR has been calculated using the CKD EPI equation. This calculation has not been validated in all clinical situations. eGFR's persistently <60 mL/min signify possible Chronic Kidney Disease.   Georgiann Hahn gap 06/08/2017 5  3 - 11 Final   Performed at [facility], [place] [address], [place], [place] [address]  . WBC Count 06/08/2017 2.8* 3.9 - 10.3 K/uL Final  . RBC 06/08/2017 2.94* 3.70 - 5.45 MIL/uL Final  . Hemoglobin 06/08/2017 8.1* 11.6 - 15.9 g/dL Final  . HCT 06/08/2017 25.1* 34.8 - 46.6 % Final  . MCV 06/08/2017 85.4  79.5 - 101.0 fL Final  . MCH 06/08/2017 27.6  25.1 - 34.0 pg Final  . MCHC 06/08/2017 32.4  31.5 - 36.0 g/dL Final  . RDW 06/08/2017 16.5* 11.2 - 14.5 % Final  . Platelet Count 06/08/2017 106* 145 - 400 K/uL Final  . Neutrophils Relative % 06/08/2017 81  % Final  . Neutro Abs 06/08/2017 2.3  1.5 - 6.5 K/uL Final  . Lymphocytes Relative 06/08/2017 8  % Final  . Lymphs Abs 06/08/2017 0.2* 0.9 - 3.3 K/uL Final  . Monocytes Relative 06/08/2017 7  % Final  . Monocytes Absolute 06/08/2017 0.2  0.1 - 0.9 K/uL Final  . Eosinophils Relative 06/08/2017 3  % Final  . Eosinophils Absolute 06/08/2017 0.1  0.0 - 0.5 K/uL Final  . Basophils Relative 06/08/2017 1  % Final  . Basophils Absolute 06/08/2017 0.0  0.0 - 0.1 K/uL Final   Performed at [facility], [place] [address], [place], [place] [address]  Office Visit on 06/08/2017  Component Date Value Ref Range Status  . CRP 06/08/2017 0.8  <1.0 mg/dL Final   Performed at [place] [address] [place], [place] [address]  . Sed Rate 06/08/2017 134* 0 - 22 mm/hr Final   Performed at [place] [address] [place], [place] [address]         Ardath Sax, MD

## 2017-06-10 LAB — ANTINUCLEAR ANTIBODIES, IFA: ANA Ab, IFA: NEGATIVE

## 2017-06-11 LAB — METHYLMALONIC ACID, SERUM: METHYLMALONIC ACID, QUANTITATIVE: 159 nmol/L (ref 0–378)

## 2017-06-19 ENCOUNTER — Other Ambulatory Visit: Payer: Self-pay | Admitting: Radiology

## 2017-06-22 ENCOUNTER — Ambulatory Visit (HOSPITAL_COMMUNITY)
Admission: RE | Admit: 2017-06-22 | Discharge: 2017-06-22 | Disposition: A | Discharge status: Home / Self Care | Payer: Medicare Other | Source: Ambulatory Visit | Attending: Hematology and Oncology | Admitting: Hematology and Oncology

## 2017-06-22 ENCOUNTER — Encounter (HOSPITAL_COMMUNITY): Payer: Self-pay

## 2017-06-22 DIAGNOSIS — Z79890 Hormone replacement therapy (postmenopausal): Secondary | ICD-10-CM | POA: Diagnosis not present

## 2017-06-22 DIAGNOSIS — Z881 Allergy status to other antibiotic agents status: Secondary | ICD-10-CM | POA: Diagnosis not present

## 2017-06-22 DIAGNOSIS — Z888 Allergy status to other drugs, medicaments and biological substances status: Secondary | ICD-10-CM | POA: Diagnosis not present

## 2017-06-22 DIAGNOSIS — E119 Type 2 diabetes mellitus without complications: Secondary | ICD-10-CM | POA: Insufficient documentation

## 2017-06-22 DIAGNOSIS — E079 Disorder of thyroid, unspecified: Secondary | ICD-10-CM | POA: Diagnosis not present

## 2017-06-22 DIAGNOSIS — M069 Rheumatoid arthritis, unspecified: Secondary | ICD-10-CM | POA: Insufficient documentation

## 2017-06-22 DIAGNOSIS — I509 Heart failure, unspecified: Secondary | ICD-10-CM | POA: Insufficient documentation

## 2017-06-22 DIAGNOSIS — Z79899 Other long term (current) drug therapy: Secondary | ICD-10-CM | POA: Insufficient documentation

## 2017-06-22 DIAGNOSIS — Z85820 Personal history of malignant melanoma of skin: Secondary | ICD-10-CM | POA: Insufficient documentation

## 2017-06-22 DIAGNOSIS — I110 Hypertensive heart disease with heart failure: Secondary | ICD-10-CM | POA: Insufficient documentation

## 2017-06-22 DIAGNOSIS — Z882 Allergy status to sulfonamides status: Secondary | ICD-10-CM | POA: Insufficient documentation

## 2017-06-22 DIAGNOSIS — D61818 Other pancytopenia: Secondary | ICD-10-CM | POA: Diagnosis present

## 2017-06-22 DIAGNOSIS — D472 Monoclonal gammopathy: Secondary | ICD-10-CM | POA: Insufficient documentation

## 2017-06-22 DIAGNOSIS — M310 Hypersensitivity angiitis: Secondary | ICD-10-CM | POA: Diagnosis not present

## 2017-06-22 LAB — CBC WITH DIFFERENTIAL/PLATELET
BASOS ABS: 0 10*3/uL (ref 0.0–0.1)
BASOS PCT: 0 %
EOS ABS: 0.1 10*3/uL (ref 0.0–0.7)
Eosinophils Relative: 3 %
HEMATOCRIT: 22.7 % — AB (ref 36.0–46.0)
Hemoglobin: 7.4 g/dL — ABNORMAL LOW (ref 12.0–15.0)
Lymphocytes Relative: 8 %
Lymphs Abs: 0.2 10*3/uL — ABNORMAL LOW (ref 0.7–4.0)
MCH: 28.2 pg (ref 26.0–34.0)
MCHC: 32.6 g/dL (ref 30.0–36.0)
MCV: 86.6 fL (ref 78.0–100.0)
MONO ABS: 0.3 10*3/uL (ref 0.1–1.0)
Monocytes Relative: 11 %
NEUTROS ABS: 1.9 10*3/uL (ref 1.7–7.7)
Neutrophils Relative %: 78 %
Platelets: 89 10*3/uL — ABNORMAL LOW (ref 150–400)
RBC: 2.62 MIL/uL — ABNORMAL LOW (ref 3.87–5.11)
RDW: 14.7 % (ref 11.5–15.5)
WBC: 2.4 10*3/uL — ABNORMAL LOW (ref 4.0–10.5)

## 2017-06-22 LAB — BASIC METABOLIC PANEL
ANION GAP: 8 (ref 5–15)
BUN: 38 mg/dL — ABNORMAL HIGH (ref 6–20)
CO2: 19 mmol/L — ABNORMAL LOW (ref 22–32)
CREATININE: 1.5 mg/dL — AB (ref 0.44–1.00)
Calcium: 9.1 mg/dL (ref 8.9–10.3)
Chloride: 110 mmol/L (ref 101–111)
GFR, EST AFRICAN AMERICAN: 40 mL/min — AB (ref >60)
GFR, EST NON AFRICAN AMERICAN: 34 mL/min — AB (ref >60)
GLUCOSE: 127 mg/dL — AB (ref 65–99)
Potassium: 4.8 mmol/L (ref 3.5–5.1)
Sodium: 137 mmol/L (ref 135–145)

## 2017-06-22 LAB — GLUCOSE, CAPILLARY: Glucose-Capillary: 124 mg/dL — ABNORMAL HIGH (ref 65–99)

## 2017-06-22 LAB — PROTIME-INR
INR: 0.92
Prothrombin Time: 12.3 seconds (ref 11.4–15.2)

## 2017-06-22 MED ORDER — MIDAZOLAM HCL 2 MG/2ML IJ SOLN
INTRAMUSCULAR | Status: AC
  Filled 2017-06-22: qty 2

## 2017-06-22 MED ORDER — MIDAZOLAM HCL 2 MG/2ML IJ SOLN
INTRAMUSCULAR | Status: AC | PRN
  Administered 2017-06-22 (×3): 0.5 mg via INTRAVENOUS

## 2017-06-22 MED ORDER — FENTANYL CITRATE (PF) 100 MCG/2ML IJ SOLN
INTRAMUSCULAR | Status: AC | PRN
  Administered 2017-06-22 (×3): 25 ug via INTRAVENOUS

## 2017-06-22 MED ORDER — FENTANYL CITRATE (PF) 100 MCG/2ML IJ SOLN
INTRAMUSCULAR | Status: AC
  Filled 2017-06-22: qty 2

## 2017-06-22 MED ORDER — LIDOCAINE HCL (PF) 1 % IJ SOLN
INTRAMUSCULAR | Status: AC | PRN
  Administered 2017-06-22: 30 mL

## 2017-06-22 MED ORDER — SODIUM CHLORIDE 0.9 % IV SOLN
INTRAVENOUS | Status: DC
  Administered 2017-06-22: 08:00:00 via INTRAVENOUS

## 2017-06-22 NOTE — H&P (Signed)
Referring Physician(s): [place]  Supervising Physician: Aletta Edouard  Patient Status:  [facility] OP  Chief Complaint:  "I'm here for a bone marrow test"  Subjective: Pt familiar to IR service from prior gastrostomy tube placement and subsequent removal in 2017.  She has a history of rheumatoid arthritis, leukocytoclastic vasculitis, IgM monoclonal gammopathy and pancytopenia.  She presents today for CT-guided bone marrow biopsy to rule out MDS/AML.  She currently denies fever, headache, chest pain, dyspnea, cough, abdominal/back pain, nausea, vomiting or bleeding.  She does have some occasional sinus congestion. Past Medical History:  Diagnosis Date  . CHF (congestive heart failure) ([place])   . Diabetes mellitus   . Hypertension   . Thyroid disease   . Vasculitis [facility])    Past Surgical History:  Procedure Laterality Date  . MELANOMA EXCISION    . TUBAL LIGATION       Allergies: Benadryl [diphenhydramine]; Ciprofloxacin; Furosemide; Nitrofurantoin monohyd macro; Septra [bactrim]; Sulfa drugs cross reactors; and Ceclor [cefaclor]  Medications: Prior to Admission medications   Medication Sig Start Date End Date Taking? Authorizing Provider  amLODipine (NORVASC) 2.5 MG tablet Take 2.5 mg by mouth daily.   Yes [provider]  Calcium-Magnesium-Vitamin D (CALCIUM 500 PO) Take 500 mg by mouth every morning.   Yes [provider]  carvedilol (COREG) 25 MG tablet Take 1 tablet (25 mg total) by mouth 2 (two) times daily. 09/19/15  Yes Theodis Blaze, MD  cholecalciferol (VITAMIN D) 400 units TABS tablet Take 400 Units by mouth daily.   Yes [provider]  diltiazem (CARDIZEM) 90 MG tablet Take 90 mg by mouth 3 (three) times daily. Midnight, 6am, noon, 6pm    Yes [provider]  DULoxetine (CYMBALTA) 30 MG capsule Take 30 mg by mouth daily.   Yes [provider]  ferrous sulfate 325 (65 FE) MG tablet Take 325 mg by mouth 2 (two)  times daily with a meal.   Yes [provider]  gabapentin (NEURONTIN) 100 MG capsule Take 100 mg by mouth 2 (two) times daily.    Yes [provider]  GLUCERNA (GLUCERNA) LIQD Take 237 mLs by mouth daily.    Yes [provider]  glucosamine-chondroitin 500-400 MG tablet Take 2 tablets by mouth daily.    Yes [provider]  hydrALAZINE (APRESOLINE) 100 MG tablet Take 100 mg by mouth 3 (three) times daily.   Yes [provider]  levothyroxine (SYNTHROID, LEVOTHROID) 112 MCG tablet Take 224 mcg by mouth daily before breakfast.    Yes [provider]  lisinopril (PRINIVIL,ZESTRIL) 40 MG tablet Take 40 mg by mouth daily.    Yes [provider]  Multiple Vitamin (MULTIVITAMIN) tablet Take 1 tablet by mouth daily.   Yes [provider]  Omega-3 Fatty Acids (FISH OIL) 1000 MG CAPS Take 4,000 mg by mouth daily.   Yes [provider]  torsemide (DEMADEX) 10 MG tablet Take 2 tablets (20 mg total) by mouth daily. 02/16/16  Yes Domenic Polite, MD  vitamin B-12 (CYANOCOBALAMIN) 500 MCG tablet Take 500 mcg by mouth daily.   Yes [provider]  acetaminophen (TYLENOL) 325 MG tablet Take 2 tablets (650 mg total) by mouth every 6 (six) hours as needed for fever. Patient taking differently: Take 650 mg by mouth daily.  09/19/15   Theodis Blaze, MD  amoxicillin-clavulanate (AUGMENTIN) 500-125 MG tablet Take 1 tablet by mouth as needed.     [provider]  HYDROcodone-acetaminophen (  NORCO/VICODIN) 5-325 MG tablet Take 1-2 tablets by mouth every 6 (six) hours as needed. 09/03/16   Ward, Delice Bison, DO     Vital Signs: BP (!) 188/94 (BP Location: Right Arm)   Pulse 81   Temp 98 F (36.7 C) (Oral)   Resp 16   Ht 5' 4"  (1.626 m)   Wt 131 lb (59.4 kg)   SpO2 97%   BMI 22.49 kg/m   Physical Exam awake, alert.  Chest clear to auscultation bilaterally.  Heart with regular rate and rhythm.  Abdomen soft, positive  bowel sounds, nontender.  Significant bilateral lower extremity edema noted.  Imaging: No results found.  Labs:  CBC: Recent Labs    09/02/16 2205 10/27/16 1050 06/08/17 1147 06/22/17 0724  WBC 7.8 3.6* 2.8* 2.4*  HGB 10.0* 9.6*  --  7.4*  HCT 30.2* 28.8* 25.1* 22.7*  PLT 111* 124* 106* 89*    COAGS: Recent Labs    06/22/17 0724  INR 0.92    BMP: Recent Labs    09/02/16 2205 10/27/16 1050 06/08/17 1147 06/22/17 0724  NA 135 138 136 137  K 4.6 5.2* 5.0 4.8  CL 106 106 111* 110  CO2 20* 24 20* 19*  GLUCOSE 169* 240* 130 127*  BUN 51* 45* 27* 38*  CALCIUM 10.1 9.6 9.1 9.1  CREATININE 1.40* 1.33* 1.30* 1.50*  GFRNONAA 37* 39* 41* 34*  GFRAA 43* 46* 47* 40*    LIVER FUNCTION TESTS: Recent Labs    09/02/16 2205 10/27/16 1200 06/08/17 1147  BILITOT 0.5 0.8 0.3  AST 18 18 18   ALT 17 21 17   ALKPHOS 43 49 64  PROT 7.0 6.3* 6.0*  ALBUMIN 3.5 3.2* 2.5*    Assessment and Plan:  Pt with history of rheumatoid arthritis, leukocytoclastic vasculitis, IgM monoclonal gammopathy and pancytopenia.  She presents today for CT-guided bone marrow biopsy to rule out MDS/AML. Risks and benefits discussed with the patient/daughter including, but not limited to bleeding, infection, damage to adjacent structures or low yield requiring additional tests.  All of the patient's questions were answered, patient is agreeable to proceed. Consent signed and in chart.     Electronically Signed: D. Rowe Robert, PA-C 06/22/2017, 8:33 AM   I spent a total of 20 minutes at the the patient's bedside AND on the patient's hospital floor or unit, greater than 50% of which was counseling/coordinating care for CT-guided bone marrow biopsy

## 2017-06-22 NOTE — Procedures (Signed)
Interventional Radiology Procedure Note  Procedure: CT guided bone marrow aspiration and biopsy  Complications: None  EBL: < 10 mL  Findings: Aspirate and core biopsy performed of bone marrow in right iliac bone.  Plan: Bedrest supine x 1 hrs  Laronn Devonshire T. Makai Agostinelli, M.D Pager:  [phone]   

## 2017-06-22 NOTE — Discharge Instructions (Signed)

## 2017-07-04 ENCOUNTER — Encounter (HOSPITAL_COMMUNITY): Payer: Self-pay | Admitting: Hematology and Oncology

## 2017-07-07 LAB — TISSUE HYBRIDIZATION (BONE MARROW)-NCBH

## 2017-07-07 LAB — CHROMOSOME ANALYSIS, BONE MARROW

## 2017-07-08 ENCOUNTER — Inpatient Hospital Stay: Payer: Medicare Other

## 2017-07-08 ENCOUNTER — Encounter: Payer: Self-pay | Admitting: Hematology and Oncology

## 2017-07-08 ENCOUNTER — Inpatient Hospital Stay: Payer: Medicare Other | Attending: Hematology and Oncology | Admitting: Hematology and Oncology

## 2017-07-08 ENCOUNTER — Telehealth: Payer: Self-pay | Admitting: Hematology and Oncology

## 2017-07-08 ENCOUNTER — Ambulatory Visit (HOSPITAL_COMMUNITY)
Admission: RE | Admit: 2017-07-08 | Discharge: 2017-07-08 | Disposition: A | Discharge status: Home / Self Care | Payer: Medicare Other | Source: Ambulatory Visit | Attending: Hematology and Oncology | Admitting: Hematology and Oncology

## 2017-07-08 VITALS — BP 187/67 | HR 95 | Temp 98.7°F | Resp 18 | Ht 64.0 in | Wt 141.1 lb

## 2017-07-08 DIAGNOSIS — M7989 Other specified soft tissue disorders: Secondary | ICD-10-CM

## 2017-07-08 DIAGNOSIS — Z79899 Other long term (current) drug therapy: Secondary | ICD-10-CM | POA: Diagnosis not present

## 2017-07-08 DIAGNOSIS — D61818 Other pancytopenia: Secondary | ICD-10-CM | POA: Diagnosis not present

## 2017-07-08 DIAGNOSIS — D472 Monoclonal gammopathy: Secondary | ICD-10-CM | POA: Insufficient documentation

## 2017-07-08 DIAGNOSIS — M310 Hypersensitivity angiitis: Secondary | ICD-10-CM | POA: Insufficient documentation

## 2017-07-08 DIAGNOSIS — M069 Rheumatoid arthritis, unspecified: Secondary | ICD-10-CM | POA: Insufficient documentation

## 2017-07-08 DIAGNOSIS — D469 Myelodysplastic syndrome, unspecified: Secondary | ICD-10-CM

## 2017-07-08 LAB — IRON AND TIBC
IRON: 37 ug/dL — AB (ref 41–142)
SATURATION RATIOS: 16 % — AB (ref 21–57)
TIBC: 229 ug/dL — ABNORMAL LOW (ref 236–444)
UIBC: 192 ug/dL

## 2017-07-08 LAB — FERRITIN: Ferritin: 72 ng/mL (ref 9–269)

## 2017-07-08 NOTE — Progress Notes (Signed)
Bilateral lower extremity venous duplex has been completed. Negative for obvious evidence of DVT. Results were given to Dr. Lebron Conners.  07/08/17 11:19 AM Carlos Levering RVT

## 2017-07-08 NOTE — Telephone Encounter (Signed)
Appointments scheduled AVS/Calendar printed per 4/26 los °

## 2017-07-09 LAB — CERULOPLASMIN: CERULOPLASMIN: 181 mg/dL — AB (ref 19.0–39.0)

## 2017-07-09 LAB — ERYTHROPOIETIN: Erythropoietin: 7.5 m[IU]/mL (ref 2.6–18.5)

## 2017-07-09 LAB — COPPER, SERUM: COPPER: 126 ug/dL (ref 72–166)

## 2017-07-09 LAB — ZINC: ZINC: 46 ug/dL — AB (ref 56–134)

## 2017-07-09 LAB — IGG, IGA, IGM
IGM (IMMUNOGLOBULIN M), SRM: 1926 mg/dL — AB (ref 26–217)
IgA: <5 mg/dL — ABNORMAL LOW (ref 87–352)
IgG (Immunoglobin G), Serum: 130 mg/dL — ABNORMAL LOW (ref 700–1600)

## 2017-07-11 LAB — KAPPA/LAMBDA LIGHT CHAINS
KAPPA FREE LGHT CHN: 117.5 mg/L — AB (ref 3.3–19.4)
Kappa, lambda light chain ratio: 12.63 — ABNORMAL HIGH (ref 0.26–1.65)
Lambda free light chains: 9.3 mg/L (ref 5.7–26.3)

## 2017-07-12 ENCOUNTER — Ambulatory Visit (HOSPITAL_COMMUNITY)
Admission: RE | Admit: 2017-07-12 | Discharge: 2017-07-12 | Disposition: A | Discharge status: Home / Self Care | Payer: Medicare Other | Source: Ambulatory Visit | Attending: Hematology and Oncology | Admitting: Hematology and Oncology

## 2017-07-12 DIAGNOSIS — R161 Splenomegaly, not elsewhere classified: Secondary | ICD-10-CM | POA: Insufficient documentation

## 2017-07-12 DIAGNOSIS — R188 Other ascites: Secondary | ICD-10-CM | POA: Insufficient documentation

## 2017-07-12 DIAGNOSIS — D469 Myelodysplastic syndrome, unspecified: Secondary | ICD-10-CM

## 2017-07-12 DIAGNOSIS — K862 Cyst of pancreas: Secondary | ICD-10-CM | POA: Insufficient documentation

## 2017-07-12 DIAGNOSIS — K769 Liver disease, unspecified: Secondary | ICD-10-CM | POA: Diagnosis not present

## 2017-07-12 DIAGNOSIS — D649 Anemia, unspecified: Secondary | ICD-10-CM | POA: Diagnosis present

## 2017-07-12 DIAGNOSIS — J90 Pleural effusion, not elsewhere classified: Secondary | ICD-10-CM | POA: Insufficient documentation

## 2017-07-14 DIAGNOSIS — D469 Myelodysplastic syndrome, unspecified: Secondary | ICD-10-CM | POA: Insufficient documentation

## 2017-07-14 LAB — PROTEIN ELECTROPHORESIS, SERUM, WITH REFLEX
A/G Ratio: 0.9 (ref 0.7–1.7)
ALPHA-1-GLOBULIN: 0.3 g/dL (ref 0.0–0.4)
ALPHA-2-GLOBULIN: 0.6 g/dL (ref 0.4–1.0)
Albumin ELP: 2.7 g/dL — ABNORMAL LOW (ref 2.9–4.4)
Beta Globulin: 0.7 g/dL (ref 0.7–1.3)
Gamma Globulin: 1.2 g/dL (ref 0.4–1.8)
Globulin, Total: 2.9 g/dL (ref 2.2–3.9)
M-Spike, %: 1.1 g/dL — ABNORMAL HIGH
SPEP Interpretation: 0
Total Protein ELP: 5.6 g/dL — ABNORMAL LOW (ref 6.0–8.5)

## 2017-07-14 LAB — IMMUNOFIXATION REFLEX, SERUM
IGG (IMMUNOGLOBIN G), SERUM: 161 mg/dL — AB (ref 700–1600)
IgA: <5 mg/dL — ABNORMAL LOW (ref 87–352)
IgM (Immunoglobulin M), Srm: 1937 mg/dL — ABNORMAL HIGH (ref 26–217)

## 2017-07-14 NOTE — Assessment & Plan Note (Signed)
72 y.o. 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  On review, patient's blood counts appear to have dropped significantly in November 2018, but remained  stable since then.  Patient has previously received methotrexate for rheumatoid arthritis treatment, but has not been receiving it for a prolonged period of time.  Patient cannot recall how long exactly.  We have obtain bone marrow biopsy as part of the evaluation with her underlying condition.  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  Nevertheless, patient does have lymphoid aggregates and kappa light chain restricted plasma cell population and presence of a lymphoplasmacytic lymphoma or multiple myeloma cannot be excluded at this time.  Plan: - Additional labs as outlined below.. -Bilateral lower extremity Doppler ultrasound to evaluate for possible deep vein thrombosis -Return to clinic 1 weeks: Labs and possible transfusion support.

## 2017-07-14 NOTE — Progress Notes (Signed)
[facility] Follow-up Visit:  Assessment: MDS (myelodysplastic syndrome) ([place]) 72 y.o. 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  On review, patient's blood counts appear to have dropped significantly in November 2018, but remained  stable since then.  Patient has previously received methotrexate for rheumatoid arthritis treatment, but has not been receiving it for a prolonged period of time.  Patient cannot recall how long exactly.  We have obtain bone marrow biopsy as part of the evaluation with her underlying condition.  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  Nevertheless, patient does have lymphoid aggregates and kappa light chain restricted plasma cell population and presence of a lymphoplasmacytic lymphoma or multiple myeloma cannot be excluded at this time.  Plan: - Additional labs as outlined below.. -Bilateral lower extremity Doppler ultrasound to evaluate for possible deep vein thrombosis -Return to clinic 1 weeks: Labs and possible transfusion support.   Voice recognition software was used and creation of this note. Despite my best effort at editing the text, some misspelling/errors may have occurred.  Orders Placed This Encounter  Procedures  . US Abdomen Complete    Standing Status:   Future    Number of Occurrences:   1    Standing Expiration Date:   07/08/2018    Order Specific Question:   Reason for Exam (SYMPTOM  OR DIAGNOSIS REQUIRED)    Answer:   Right upper quadrant pain, please eval for cirrhosis    Order Specific Question:   Preferred imaging location?    Answer:   [place] with reflex to IFE    Standing Status:   Future    Number of Occurrences:   1    Standing Expiration Date:   07/08/2018  .  Kappa/lambda light chains    Standing Status:   Future    Number of Occurrences:   1    Standing Expiration Date:   07/08/2018  . QIG  (Quant. immunoglobulins  - IgG, IgA, IgM)    Standing Status:   Future    Number of Occurrences:   1    Standing Expiration Date:   07/08/2018  . Copper, serum    Standing Status:   Future    Number of Occurrences:   1    Standing Expiration Date:   07/09/2018  . Ceruloplasmin    Standing Status:   Future    Number of Occurrences:   1    Standing Expiration Date:   07/09/2018  . Zinc    Standing Status:   Future    Number of Occurrences:   1    Standing Expiration Date:   07/08/2018  . Iron and TIBC    Standing Status:   Future    Number of Occurrences:   1    Standing Expiration Date:   07/09/2018  . Ferritin    Standing Status:   Future    Number of Occurrences:   1    Standing Expiration Date:   07/09/2018  . Erythropoietin  . CBC with Differential (Cancer Center Only)    Standing Status:   Future    Standing Expiration Date:   07/09/2018  . CMP ([place] only)    Standing Status:   Future    Standing Expiration Date:   07/09/2018  . Sample to Blood Bank  Standing Status:   Future    Standing Expiration Date:   07/09/2018    Cancer Staging No matching staging information was found for the patient.  All questions were answered.  . The patient knows to call the clinic with any problems, questions or concerns.  This note was electronically signed.    History of Presenting Illness Emily Kennedy is a 72 y.o. female followed inthe [place] for new diagnosis of possible myelodysplastic syndrome, referred by Dr [name].  Patient's past medical history is significant for rheumatoid arthritis, leukocytoclastic vasculitis, iron deficiency anemia, IgM monoclonal gammopathy, splenomegaly, hypothyroidism, hyperlipidemia, and diabetes mellitus.  Patient is currently receiving prednisone, ferrous sulfate twice daily and vitamin B12  supplementation.  She has no history of tobacco or alcohol abuse.  Patient reports symptoms of fatigue, but denies any fevers, chills, night sweats.  Denies any significant weight loss, but appetite has been poor for an extended period of time.  Stable pain in joints associated with her arthritis diagnosis, stable swelling in bilateral lower extremities.  No recent rash or skin ulcerations.  No oral sores or swollen lymph nodes.  No easy bruisability.  Oncological/hematological History: --Labs, 01/17/17: tProt 6.0, Alb 2.4, Ca 9.0, Cr 1.1, AP 98;  WBC 2.5, Hgb 8.9, MCV 92.6, MCH 30.0, MCHC 32.4, RDW 13.3,  Plt 127; Ferritin 83.0 --CT A/P, 01/28/17: Dilated portal vein and splenic vein with splenomegaly and mild ascites in the abdomen and pelvis. Findings suggestive of portal venous hypertension possibly related to cirrhosis. Question subtle nodularity of the liver. Moderate pericardial effusion. Small bilateral pleural effusions, left greater than right. --Labs, 05/10/17: tProt 6.1, Alb 2.5, Ca 9.1, Cr 1.2, AP 91;  WBC 2.3, Hgb 8.1, MCV 91.5, MCH 30.0, MCHC 32.8, RDW 14.5,  Plt 122 --Labs, 06/22/17:       WBC 2.4, Hgb 7.4, MCV 86.6,      Plt   89 --BM Bx, 06/22/17:  normocellular bone marrow with dysplasia of the megakaryocytes, large lymphoid aggregates and normal percentage of plasma cells, but with a plasma cells demonstrating kappa light chain restriction.  Flow cytometry negative for any monoclonal lymphoid process.  Cytogenetic analysis shows normal cytogenetics, 24 XX. FISH --normal result.  Oncological/hematological History:  No history exists.    Medical History: Past Medical History:  Diagnosis Date  . CHF (congestive heart failure) ([place])   . Diabetes mellitus   . Hypertension   . Thyroid disease   . Vasculitis [facility])     Surgical History: Past Surgical History:  Procedure Laterality Date  . MELANOMA EXCISION    . TUBAL LIGATION      Family History: Family History   Problem Relation Age of Onset  . Cancer Sister   . Diabetes Mellitus II Mother   . Tuberculosis Father     Social History: Social History   Socioeconomic History  . Marital status: Divorced    Spouse name: Not on file  . Number of children: Not on file  . Years of education: Not on file  . Highest education level: Not on file  Occupational History  . Occupation: Retired  Scientific laboratory technician  . Financial resource strain: Not on file  . Food insecurity:    Worry: Not on file    Inability: Not on file  . Transportation needs:    Medical: Not on file    Non-medical: Not on file  Tobacco Use  . Smoking status: Never Smoker  . Smokeless tobacco: Never Used  Substance and  Sexual Activity  . Alcohol use: No  . Drug use: No  . Sexual activity: Not on file  Lifestyle  . Physical activity:    Days per week: Not on file    Minutes per session: Not on file  . Stress: Not on file  Relationships  . Social connections:    Talks on phone: Not on file    Gets together: Not on file    Attends religious service: Not on file    Active member of club or organization: Not on file    Attends meetings of clubs or organizations: Not on file    Relationship status: Not on file  . Intimate partner violence:    Fear of current or ex partner: Not on file    Emotionally abused: Not on file    Physically abused: Not on file    Forced sexual activity: Not on file  Other Topics Concern  . Not on file  Social History Narrative  . Not on file    Allergies: Allergies  Allergen Reactions  . Benadryl [Diphenhydramine] Swelling  . Ciprofloxacin Hives  . Furosemide Itching, Swelling, Rash and Other (See Comments)    Made edema worse  . Nitrofurantoin Monohyd Macro Hives  . Septra [Bactrim] Hives  . Sulfa Drugs Cross Reactors Hives  . Ceclor [Cefaclor] Rash    Breaks out in rash chest down.    Medications:  Current Outpatient Medications  Medication Sig Dispense Refill  . acetaminophen  (TYLENOL) 325 MG tablet Take 2 tablets (650 mg total) by mouth every 6 (six) hours as needed for fever. (Patient taking differently: Take 650 mg by mouth daily. )    . amLODipine (NORVASC) 2.5 MG tablet Take 2.5 mg by mouth daily.    Marland Kitchen amoxicillin-clavulanate (AUGMENTIN) 500-125 MG tablet Take 1 tablet by mouth as needed.     . Calcium-Magnesium-Vitamin D (CALCIUM 500 PO) Take 500 mg by mouth every morning.    . carvedilol (COREG) 25 MG tablet Take 1 tablet (25 mg total) by mouth 2 (two) times daily.    . cholecalciferol (VITAMIN D) 400 units TABS tablet Take 400 Units by mouth daily.    Marland Kitchen diltiazem (CARDIZEM) 90 MG tablet Take 90 mg by mouth 3 (three) times daily. Midnight, 6am, noon, 6pm     . DULoxetine (CYMBALTA) 30 MG capsule Take 30 mg by mouth daily.    . ferrous sulfate 325 (65 FE) MG tablet Take 325 mg by mouth 2 (two) times daily with a meal.    . gabapentin (NEURONTIN) 100 MG capsule Take 100 mg by mouth 2 (two) times daily.     Marland Kitchen GLUCERNA (GLUCERNA) LIQD Take 237 mLs by mouth daily.     Marland Kitchen glucosamine-chondroitin 500-400 MG tablet Take 2 tablets by mouth daily.     . hydrALAZINE (APRESOLINE) 100 MG tablet Take 100 mg by mouth 3 (three) times daily.    Marland Kitchen HYDROcodone-acetaminophen (NORCO/VICODIN) 5-325 MG tablet Take 1-2 tablets by mouth every 6 (six) hours as needed. 20 tablet 0  . levothyroxine (SYNTHROID, LEVOTHROID) 112 MCG tablet Take 224 mcg by mouth daily before breakfast.     . lisinopril (PRINIVIL,ZESTRIL) 40 MG tablet Take 40 mg by mouth daily.     . Multiple Vitamin (MULTIVITAMIN) tablet Take 1 tablet by mouth daily.    . Omega-3 Fatty Acids (FISH OIL) 1000 MG CAPS Take 4,000 mg by mouth daily.    Marland Kitchen torsemide (DEMADEX) 10 MG tablet Take 2 tablets (20 mg  total) by mouth daily. 30 tablet 0  . vitamin B-12 (CYANOCOBALAMIN) 500 MCG tablet Take 500 mcg by mouth daily.     No current facility-administered medications for this visit.     Review of Systems: Review of Systems   Constitutional: Positive for fatigue. Negative for chills, diaphoresis, fever and unexpected weight change.  Cardiovascular: Positive for leg swelling.  All other systems reviewed and are negative.    PHYSICAL EXAMINATION Blood pressure (!) 187/67, pulse 95, temperature 98.7 F (37.1 C), temperature source Oral, resp. rate 18, height 5' 4"  (1.626 m), weight 141 lb 1.6 oz (64 kg), SpO2 96 %.  ECOG PERFORMANCE STATUS: 2 - Symptomatic, <50% confined to bed  Physical Exam  Constitutional: She is oriented to person, place, and time. She appears well-developed and well-nourished. No distress.  HENT:  Head: Normocephalic and atraumatic.  Mouth/Throat: Oropharynx is clear and moist. No oropharyngeal exudate.  Eyes: Pupils are equal, round, and reactive to light. Conjunctivae and EOM are normal. No scleral icterus.  Neck: No thyromegaly present.  Cardiovascular: Normal rate, regular rhythm, normal heart sounds and intact distal pulses. Exam reveals no gallop and no friction rub.  No murmur heard. Pulmonary/Chest: Effort normal and breath sounds normal. No stridor. No respiratory distress. She has no wheezes.  Abdominal: Soft. Bowel sounds are normal. She exhibits no distension and no mass. There is no tenderness. There is no guarding.  Musculoskeletal: She exhibits edema.  2+ bilateral lower extremity edema which is tender to palpation without palpable vascular cords.  Lymphadenopathy:    She has no cervical adenopathy.  Neurological: She is alert and oriented to person, place, and time. She displays normal reflexes. No cranial nerve deficit or sensory deficit.  Skin: Skin is warm and dry. No rash noted. She is not diaphoretic. No erythema. No pallor.     LABORATORY DATA: I have personally reviewed the data as listed: Clinical Support on 07/08/2017  Component Date Value Ref Range Status  . Total Protein ELP 07/08/2017 5.6* 6.0 - 8.5 g/dL Final  . Albumin ELP 07/08/2017 2.7* 2.9 - 4.4  g/dL Final  . Alpha-1-Globulin 07/08/2017 0.3  0.0 - 0.4 g/dL Final  . Alpha-2-Globulin 07/08/2017 0.6  0.4 - 1.0 g/dL Final  . Beta Globulin 07/08/2017 0.7  0.7 - 1.3 g/dL Final  . Gamma Globulin 07/08/2017 1.2  0.4 - 1.8 g/dL Final  . M-Spike, % 07/08/2017 1.1* Not Observed g/dL Final  . GLOBULIN, TOTAL 07/08/2017 2.9  2.2 - 3.9 g/dL Corrected  . A/G Ratio 07/08/2017 0.9  0.7 - 1.7 Corrected  . Comment 07/08/2017 Comment   Corrected   Comment: (NOTE) Protein electrophoresis scan will follow via computer, mail, or courier delivery.   Marland Kitchen SPEP Interpretation 07/08/2017 .   Final   Comment: (NOTE) Performed At: [facility] [place], [place] [id number] Rush Farmer MD [id number] Performed at [facility], [place] [address], [place], [place] [address]   . Kappa free light chain 07/08/2017 117.5* 3.3 - 19.4 mg/L Final  . Lamda free light chains 07/08/2017 9.3  5.7 - 26.3 mg/L Final  . Kappa, lamda light chain ratio 07/08/2017 12.63* 0.26 - 1.65 Final   Comment: (NOTE) Performed At: [facility] [place], [place] [id number] Rush Farmer MD [id number] Performed at [facility], [place] [address], [place], [place] [address]   . IgG (Immunoglobin G), Serum 07/08/2017 130* 700 - 1,600 mg/dL Final   Result confirmed on  concentration.  . IgA 07/08/2017 <5* 87 - 352 mg/dL Final   Result confirmed on concentration.  . IgM (Immunoglobulin M), Srm 07/08/2017 1,926* 26 - 217 mg/dL Final   Comment: (NOTE) Results confirmed on dilution. Performed At: [facility] [place], [place] [id number] Rush Farmer MD [id number] Performed at [facility], [place] [address] [place], [place] [address]   . Copper 07/08/2017 126  72 - 166 ug/dL Final   Comment: (NOTE) This test was developed and its performance characteristics determined by [facility]. It has  not been cleared or approved by the Food and Drug Administration.                                Detection Limit = 5 Performed At: [facility] [place], [place] [id number] Rush Farmer MD [id number] Performed at [facility], [place] [address] [place], [place] [address]   . Ceruloplasmin 07/08/2017 181.0* 19.0 - 39.0 mg/dL Final   Comment: (NOTE) Results confirmed on dilution. Performed At: [facility] [place], [place] [id number] Rush Farmer MD [id number] Performed at [facility], [place] [address] [place], [place] [address]   . Zinc 07/08/2017 46* 56 - 134 ug/dL Final   Comment: (NOTE) This test was developed and its performance characteristics determined by [facility]. It has not been cleared or approved by the Food and Drug Administration.                                Detection Limit = 5 Performed At: [facility] [place], [place] [id number] Rush Farmer MD [id number] Performed at [facility], [place] [address] [place], [place] [address]   . Iron 07/08/2017 37* 41 - 142 ug/dL Final  . TIBC 07/08/2017 229* 236 - 444 ug/dL Final  . Saturation Ratios 07/08/2017 16* 21 - 57 % Final  . UIBC 07/08/2017 192  ug/dL Final   Performed at [facility], [place] [address] [place], [place] [address]  . Ferritin 07/08/2017 72  9 - 269 ng/mL Final   Performed at [facility], [place] [address] [place], [place] [address]  . IgG (Immunoglobin G), Serum 07/08/2017 161* 700 - 1,600 mg/dL Final   Result confirmed on concentration.  . IgA 07/08/2017 <5* 87 - 352 mg/dL Final   Result confirmed on concentration.  . IgM (Immunoglobulin M), Srm 07/08/2017 1,937* 26 - 217 mg/dL Final   Comment: (NOTE) Results confirmed on dilution.   . IFE 1 07/08/2017 Comment   Final   Comment:  (NOTE) Immunofixation shows IgM monoclonal protein with kappa light chain specificity. Performed At: [facility] [place], [place] [id number] Rush Farmer MD [id number] Performed at [facility], [place] [address] [place], [place] [address]   Office Visit on 07/08/2017  Component Date Value Ref Range Status  . Erythropoietin 07/08/2017 7.5  2.6 - 18.5 mIU/mL Final   Comment: (NOTE) [facility] UniCel DxI [place] obtained with different assay methods or kits cannot be used interchangeably. Results cannot be interpreted as absolute evidence of the presence or absence of malignant disease. Performed At: [facility] [place], [place] [id number] Rush Farmer MD [id number] Performed at [facility],  [place] [address], [place], [place] [address]        Ardath Sax, MD

## 2017-07-15 ENCOUNTER — Inpatient Hospital Stay: Payer: Medicare Other

## 2017-07-15 ENCOUNTER — Other Ambulatory Visit (HOSPITAL_COMMUNITY)
Admission: RE | Admit: 2017-07-15 | Discharge: 2017-07-15 | Disposition: A | Discharge status: Home / Self Care | Payer: Medicare Other | Source: Ambulatory Visit | Attending: Hematology and Oncology | Admitting: Hematology and Oncology

## 2017-07-15 ENCOUNTER — Encounter (HOSPITAL_COMMUNITY): Payer: Medicare Other

## 2017-07-15 ENCOUNTER — Encounter: Payer: Self-pay | Admitting: Hematology and Oncology

## 2017-07-15 ENCOUNTER — Ambulatory Visit: Payer: Medicare Other | Admitting: Hematology and Oncology

## 2017-07-15 ENCOUNTER — Telehealth: Payer: Self-pay | Admitting: Hematology and Oncology

## 2017-07-15 ENCOUNTER — Inpatient Hospital Stay: Payer: Medicare Other | Attending: Hematology and Oncology | Admitting: Hematology and Oncology

## 2017-07-15 VITALS — BP 194/81 | HR 79 | Temp 98.2°F | Resp 17 | Ht 64.0 in | Wt 144.3 lb

## 2017-07-15 DIAGNOSIS — D509 Iron deficiency anemia, unspecified: Secondary | ICD-10-CM | POA: Insufficient documentation

## 2017-07-15 DIAGNOSIS — C8300 Small cell B-cell lymphoma, unspecified site: Secondary | ICD-10-CM

## 2017-07-15 DIAGNOSIS — M310 Hypersensitivity angiitis: Secondary | ICD-10-CM

## 2017-07-15 DIAGNOSIS — C9200 Acute myeloblastic leukemia, not having achieved remission: Secondary | ICD-10-CM | POA: Diagnosis not present

## 2017-07-15 DIAGNOSIS — Z79899 Other long term (current) drug therapy: Secondary | ICD-10-CM | POA: Insufficient documentation

## 2017-07-15 DIAGNOSIS — Z5112 Encounter for antineoplastic immunotherapy: Secondary | ICD-10-CM | POA: Diagnosis not present

## 2017-07-15 DIAGNOSIS — D801 Nonfamilial hypogammaglobulinemia: Secondary | ICD-10-CM

## 2017-07-15 DIAGNOSIS — E039 Hypothyroidism, unspecified: Secondary | ICD-10-CM | POA: Diagnosis not present

## 2017-07-15 DIAGNOSIS — M069 Rheumatoid arthritis, unspecified: Secondary | ICD-10-CM | POA: Diagnosis not present

## 2017-07-15 DIAGNOSIS — E785 Hyperlipidemia, unspecified: Secondary | ICD-10-CM | POA: Diagnosis not present

## 2017-07-15 DIAGNOSIS — D472 Monoclonal gammopathy: Secondary | ICD-10-CM | POA: Diagnosis not present

## 2017-07-15 DIAGNOSIS — D469 Myelodysplastic syndrome, unspecified: Secondary | ICD-10-CM | POA: Diagnosis present

## 2017-07-15 DIAGNOSIS — E119 Type 2 diabetes mellitus without complications: Secondary | ICD-10-CM | POA: Diagnosis not present

## 2017-07-15 DIAGNOSIS — D61818 Other pancytopenia: Secondary | ICD-10-CM | POA: Insufficient documentation

## 2017-07-15 DIAGNOSIS — E60 Dietary zinc deficiency: Secondary | ICD-10-CM

## 2017-07-15 DIAGNOSIS — M7989 Other specified soft tissue disorders: Secondary | ICD-10-CM

## 2017-07-15 LAB — CBC WITH DIFFERENTIAL (CANCER CENTER ONLY)
Basophils Absolute: 0 10*3/uL (ref 0.0–0.1)
Basophils Relative: 0 %
EOS PCT: 3 %
Eosinophils Absolute: 0.1 10*3/uL (ref 0.0–0.5)
HCT: 24.5 % — ABNORMAL LOW (ref 34.8–46.6)
Hemoglobin: 7.8 g/dL — ABNORMAL LOW (ref 11.6–15.9)
LYMPHS ABS: 0.2 10*3/uL — AB (ref 0.9–3.3)
LYMPHS PCT: 11 %
MCH: 28.9 pg (ref 25.1–34.0)
MCHC: 31.8 g/dL (ref 31.5–36.0)
MCV: 90.7 fL (ref 79.5–101.0)
MONO ABS: 0.1 10*3/uL (ref 0.1–0.9)
MONOS PCT: 3 %
Neutro Abs: 1.7 10*3/uL (ref 1.5–6.5)
Neutrophils Relative %: 83 %
PLATELETS: 83 10*3/uL — AB (ref 145–400)
RBC: 2.7 MIL/uL — ABNORMAL LOW (ref 3.70–5.45)
RDW: 14.3 % (ref 11.2–14.5)
WBC Count: 2.1 10*3/uL — ABNORMAL LOW (ref 3.9–10.3)

## 2017-07-15 LAB — CMP (CANCER CENTER ONLY)
ALBUMIN: 2.4 g/dL — AB (ref 3.5–5.0)
ALT: 21 U/L (ref 0–55)
AST: 22 U/L (ref 5–34)
Alkaline Phosphatase: 69 U/L (ref 40–150)
Anion gap: 2 — ABNORMAL LOW (ref 3–11)
BUN: 35 mg/dL — AB (ref 7–26)
CHLORIDE: 111 mmol/L — AB (ref 98–109)
CO2: 22 mmol/L (ref 22–29)
Calcium: 9 mg/dL (ref 8.4–10.4)
Creatinine: 1.46 mg/dL — ABNORMAL HIGH (ref 0.60–1.10)
GFR, Est AFR Am: 41 mL/min — ABNORMAL LOW (ref >60)
GFR, Estimated: 35 mL/min — ABNORMAL LOW (ref >60)
GLUCOSE: 111 mg/dL (ref 70–140)
POTASSIUM: 5.4 mmol/L — AB (ref 3.5–5.1)
Sodium: 135 mmol/L — ABNORMAL LOW (ref 136–145)
Total Bilirubin: 0.2 mg/dL (ref 0.2–1.2)
Total Protein: 5.8 g/dL — ABNORMAL LOW (ref 6.4–8.3)

## 2017-07-15 LAB — SAMPLE TO BLOOD BANK

## 2017-07-15 NOTE — Telephone Encounter (Signed)
Scheduled appt per 5/3 los - Gave patient AVS and calender per los.  

## 2017-07-17 DIAGNOSIS — C8300 Small cell B-cell lymphoma, unspecified site: Secondary | ICD-10-CM | POA: Insufficient documentation

## 2017-07-17 DIAGNOSIS — D801 Nonfamilial hypogammaglobulinemia: Secondary | ICD-10-CM | POA: Insufficient documentation

## 2017-07-17 DIAGNOSIS — E60 Dietary zinc deficiency: Secondary | ICD-10-CM | POA: Insufficient documentation

## 2017-07-17 NOTE — Assessment & Plan Note (Addendum)
72 y.o. female with history of rheumatoid arthritis, leukocytoclastic vasculitis, IgM mo patient's lymphoproliferative disorder likely takes priority for management of her myelodysplastic syndrome.  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  On review, patient's blood counts appear to have dropped significantly in Nov 2018, but remained  stable since then.  Patient has previously received methotrexate for rheumatoid arthritis treatment, but has not been receiving it for a prolonged period of time.  Patient cannot recall how long exactly.  We have obtain bone marrow biopsy as part of the evaluation with her underlying condition.  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  Nevertheless, patient did have lymphoid aggregates and kappa light chain restricted plasma cell population and presence of a lymphoplasmacytic lymphoma or multiple myeloma cannot be excluded at this time.  Plan: - Lymphoproliferative process takes priority in evaluation and management over the myelodysplasia.

## 2017-07-17 NOTE — Assessment & Plan Note (Signed)
Severe depression for the IgG levels contributing to risk of infections at this time.  Unfortunately, due to high levels of IgM, IVIG replacement is not feasible especially considering that rituximab will be administered to the patient.  Following completion of rituximab therapy, patient will likely need IVIG treatment.

## 2017-07-17 NOTE — Assessment & Plan Note (Signed)
Found during evaluation for pancytopenia.  Patient does have elevated copper levels, normal ceruloplasmin.  This likely is related to nutritional deficiency.  Plan: -Patient will start over-the-counter zinc supplementation. -We will repeat levels in 2 to 3 months.

## 2017-07-17 NOTE — Progress Notes (Signed)
[facility] Follow-up Visit:  Assessment: MDS (myelodysplastic syndrome) ([place]) 72 y.o. female with history of rheumatoid arthritis, leukocytoclastic vasculitis, IgM mo patient's lymphoproliferative disorder likely takes priority for management of her myelodysplastic syndrome.  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  On review, patient's blood counts appear to have dropped significantly in Nov 2018, but remained  stable since then.  Patient has previously received methotrexate for rheumatoid arthritis treatment, but has not been receiving it for a prolonged period of time.  Patient cannot recall how long exactly.  We have obtain bone marrow biopsy as part of the evaluation with her underlying condition.  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  Nevertheless, patient did have lymphoid aggregates and kappa light chain restricted plasma cell population and presence of a lymphoplasmacytic lymphoma or multiple myeloma cannot be excluded at this time.  Plan: - Lymphoproliferative process takes priority in evaluation and management over the myelodysplasia.  Malignant lymphoma, lymphoplasmacytic (HCC) Following the results of the bone marrow biopsy demonstrating lymphoid aggregates and kappa light chain restricted plasma cells in the bone marrow, suspicion was raised for possible presence of lymphoplasmacytic lymphoma.  This is supported by findings of significant monoclonal IgM gammopathy with IgM kappa present in the peripheral blood.  Concurrently, patient has severe deficiency in the IgG and IgA likely due to suppression by the malignant clone.  Patient's myelodysplastic syndrome does not warrant therapy in itself, but patient is likely suffering from the lymphoplasmacytic lymphoma based on the significant gammopathy and concurrent cytopenias  warranting initiation of therapy.  Plan: - PET/CT. - Return to clinic in 2 weeks with labs, possible initiation of systemic chemotherapy with rituximab and bendamustine.  Due to poor functional status, we will consider reducing bendamustine dose by 50%.  Administering over a single day instead of 2 days.  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  Unfortunately, due to high levels of IgM, IVIG replacement is not feasible especially considering that rituximab will be administered to the patient.  Following completion of rituximab therapy, patient will likely need IVIG treatment.  Zinc deficiency Found during evaluation for pancytopenia.  Patient does have elevated copper levels, normal ceruloplasmin.  This likely is related to nutritional deficiency.  Plan: -Patient will start over-the-counter zinc supplementation. -We will repeat levels in 2 to 3 months.   Voice recognition software was used and creation of this note. Despite my best effort at editing the text, some misspelling/errors may have occurred.  Orders Placed This Encounter  Procedures  . NM PET Image Initial (PI) Whole Body    Standing Status:   Future    Standing Expiration Date:   07/15/2018    Order Specific Question:   If indicated for the ordered procedure, I authorize the administration of a radiopharmaceutical per Radiology protocol    Answer:   Yes    Order Specific Question:   Preferred imaging location?    Answer:   [facility]    Order Specific Question:   Radiology Contrast Protocol - do NOT remove file path    Answer:   [email or web address]    Order Specific Question:   Reason for Exam additional comments    Answer:   Possible lymphoplasmacytic lymphoma or atypical multiple myeloma  . IR FLUORO GUIDE PORT INSERTION RIGHT    Standing Status:   Future  Standing Expiration Date:   09/15/2018    Order Specific  Question:   Reason for Exam (SYMPTOM  OR DIAGNOSIS REQUIRED)    Answer:   Likely lymphoplasmacytic lymphoma, will need systemic chemotherapy    Order Specific Question:   Preferred Imaging Location?    Answer:   [facility]  . CBC with Differential ([place] Only)    Standing Status:   Future    Standing Expiration Date:   07/16/2018  . CMP ([place] only)    Standing Status:   Future    Standing Expiration Date:   07/16/2018  . Uric acid    Standing Status:   Future    Standing Expiration Date:   07/15/2018  . Magnesium    Standing Status:   Future    Standing Expiration Date:   07/15/2018  . Lactate dehydrogenase (LDH)    Standing Status:   Future    Standing Expiration Date:   07/15/2018  . Beta 2 microglobulin, serum    Standing Status:   Future    Standing Expiration Date:   07/16/2018  . IgG    Standing Status:   Future    Standing Expiration Date:   07/15/2018  . Hepatitis B surface antigen    Standing Status:   Future    Standing Expiration Date:   07/15/2018  . Hepatitis B surface antibody    Standing Status:   Future    Standing Expiration Date:   07/15/2018  . Hepatitis B core antibody, total    Standing Status:   Future    Standing Expiration Date:   07/15/2018  . Hepatitis C antibody    Standing Status:   Future    Standing Expiration Date:   07/15/2018    Cancer Staging No matching staging information was found for the patient.  All questions were answered.  . The patient knows to call the clinic with any problems, questions or concerns.  This note was electronically signed.    History of Presenting Illness Emily Kennedy is a 72 y.o. female followed inthe [place] for new diagnosis of possible myelodysplastic syndrome, referred by Dr [name].  Patient's past medical history is significant for rheumatoid arthritis, leukocytoclastic vasculitis, iron deficiency anemia, IgM monoclonal gammopathy, splenomegaly, hypothyroidism, hyperlipidemia, and  diabetes mellitus.  Patient is currently receiving prednisone, ferrous sulfate twice daily and vitamin B12 supplementation.  She has no history of tobacco or alcohol abuse.  Patient reports symptoms of fatigue, but denies any fevers, chills, night sweats.  Denies any significant weight loss, but appetite has been poor for an extended period of time.  Stable pain in joints associated with her arthritis diagnosis, stable swelling in bilateral lower extremities.  No recent rash or skin ulcerations.  No oral sores or swollen lymph nodes.  No easy bruisability.  Oncological/hematological History: --Labs, 01/17/17: tProt 6.0, Alb 2.4, Ca 9.0, Cr 1.1, AP 98;  WBC 2.5, Hgb 8.9, MCV 92.6, MCH 30.0, MCHC 32.4, RDW 13.3,  Plt 127; Ferritin 83.0 --CT A/P, 01/28/17: Dilated portal vein and splenic vein with splenomegaly and mild ascites in the abdomen and pelvis. Findings suggestive of portal venous hypertension possibly related to cirrhosis. Question subtle nodularity of the liver. Moderate pericardial effusion. Small bilateral pleural effusions, left greater than right. --Labs, 05/10/17: tProt 6.1, Alb 2.5, Ca 9.1, Cr 1.2, AP 91;  WBC 2.3, Hgb 8.1, MCV 91.5, MCH 30.0, MCHC 32.8, RDW 14.5,  Plt 122 --Labs, 06/22/17:  WBC 2.4, Hgb 7.4, MCV 86.6,      Plt   89 --BM Bx, 06/22/17:  normocellular bone marrow with dysplasia of the megakaryocytes, large lymphoid aggregates and normal percentage of plasma cells, but with a plasma cells demonstrating kappa light chain restriction.  Flow cytometry negative for any monoclonal lymphoid process.  Cytogenetic analysis shows normal cytogenetics, 31 XX. FISH --normal result.  Oncological/hematological History:   MDS (myelodysplastic syndrome) ([place])   06/22/2017 Bone Marrow Biopsy    The marrow is overall normocellular. There is dysplasia in the megakaryocytes. There are several large lymphoid aggregates, including a few smaller ones that are paratrabecular. While plasma  cells are not increased, there is an atypical distribution and kappa-restriction. There is no monoclonal B-cell or phenotypically aberrant T-cell population by flow cytometry. The patient's history of rheumatoid arthritis and methotrexate use is noted. The dysplasia seen could be related to methotrexate use or represent a low grade myelodysplastic syndrome and correlation with cytogenetics is recommended. Similarly, the lymphoid aggregates and atypical plasma cell population could be related to the patient's autoimmune disease, but given their atypical location/distribution a lymphoproliferative process or plasma cell neoplasm is not entirely excluded.  Cytogenetics: Normal FISH normal:       07/08/2017 Initial Diagnosis    MDS (myelodysplastic syndrome) (Post): MDS-SLD IPSS-R: Low-risk (Score = 3)       Malignant lymphoma, lymphoplasmacytic ([place])   06/22/2017 Bone Marrow Biopsy    The marrow is overall normocellular. There is dysplasia in the megakaryocytes. There are several large lymphoid aggregates, including a few smaller ones that are paratrabecular. While plasma cells are not increased, there is an atypical distribution and kappa-restriction. There is no monoclonal B-cell or phenotypically aberrant T-cell population by flow cytometry. The patient's history of rheumatoid arthritis and methotrexate use is noted. The dysplasia seen could be related to methotrexate use or represent a low grade myelodysplastic syndrome and correlation with cytogenetics is recommended. Similarly, the lymphoid aggregates and atypical plasma cell population could be related to the patient's autoimmune disease, but given their atypical location/distribution a lymphoproliferative process or plasma cell neoplasm is not entirely excluded. Clinical correlation is recommended.      07/08/2017 Tumor Marker    IgM 1926, IgG 131, IgA <5 SPEP -- MSpike 1.1g/dL      07/15/2017 Initial Diagnosis    Malignant lymphoma,  lymphoplasmacytic (HCC)       Medical History: Past Medical History:  Diagnosis Date  . CHF (congestive heart failure) ([place])   . Diabetes mellitus   . Hypertension   . Thyroid disease   . Vasculitis [facility])     Surgical History: Past Surgical History:  Procedure Laterality Date  . MELANOMA EXCISION    . TUBAL LIGATION      Family History: Family History  Problem Relation Age of Onset  . Cancer Sister   . Diabetes Mellitus II Mother   . Tuberculosis Father     Social History: Social History   Socioeconomic History  . Marital status: Divorced    Spouse name: Not on file  . Number of children: Not on file  . Years of education: Not on file  . Highest education level: Not on file  Occupational History  . Occupation: Retired  Scientific laboratory technician  . Financial resource strain: Not on file  . Food insecurity:    Worry: Not on file    Inability: Not on file  . Transportation needs:    Medical: Not on file    Non-medical:  Not on file  Tobacco Use  . Smoking status: Never Smoker  . Smokeless tobacco: Never Used  Substance and Sexual Activity  . Alcohol use: No  . Drug use: No  . Sexual activity: Not on file  Lifestyle  . Physical activity:    Days per week: Not on file    Minutes per session: Not on file  . Stress: Not on file  Relationships  . Social connections:    Talks on phone: Not on file    Gets together: Not on file    Attends religious service: Not on file    Active member of club or organization: Not on file    Attends meetings of clubs or organizations: Not on file    Relationship status: Not on file  . Intimate partner violence:    Fear of current or ex partner: Not on file    Emotionally abused: Not on file    Physically abused: Not on file    Forced sexual activity: Not on file  Other Topics Concern  . Not on file  Social History Narrative  . Not on file    Allergies: Allergies  Allergen Reactions  . Benadryl [Diphenhydramine] Swelling  .  Ciprofloxacin Hives  . Furosemide Itching, Swelling, Rash and Other (See Comments)    Made edema worse  . Nitrofurantoin Monohyd Macro Hives  . Septra [Bactrim] Hives  . Sulfa Drugs Cross Reactors Hives  . Ceclor [Cefaclor] Rash    Breaks out in rash chest down.    Medications:  Current Outpatient Medications  Medication Sig Dispense Refill  . acetaminophen (TYLENOL) 325 MG tablet Take 2 tablets (650 mg total) by mouth every 6 (six) hours as needed for fever. (Patient taking differently: Take 650 mg by mouth daily. )    . amLODipine (NORVASC) 2.5 MG tablet Take 2.5 mg by mouth daily.    . Calcium-Magnesium-Vitamin D (CALCIUM 500 PO) Take 500 mg by mouth every morning.    . carvedilol (COREG) 25 MG tablet Take 1 tablet (25 mg total) by mouth 2 (two) times daily.    . cholecalciferol (VITAMIN D) 400 units TABS tablet Take 400 Units by mouth daily.    Marland Kitchen diltiazem (CARDIZEM) 90 MG tablet Take 90 mg by mouth 3 (three) times daily. Midnight, 6am, noon, 6pm     . DULoxetine (CYMBALTA) 30 MG capsule Take 30 mg by mouth daily.    . ferrous sulfate 325 (65 FE) MG tablet Take 325 mg by mouth 2 (two) times daily with a meal.    . gabapentin (NEURONTIN) 100 MG capsule Take 100 mg by mouth 2 (two) times daily.     Marland Kitchen GLUCERNA (GLUCERNA) LIQD Take 237 mLs by mouth daily.     Marland Kitchen glucosamine-chondroitin 500-400 MG tablet Take 2 tablets by mouth daily.     . hydrALAZINE (APRESOLINE) 100 MG tablet Take 100 mg by mouth 3 (three) times daily.    Marland Kitchen HYDROcodone-acetaminophen (NORCO/VICODIN) 5-325 MG tablet Take 1-2 tablets by mouth every 6 (six) hours as needed. 20 tablet 0  . levothyroxine (SYNTHROID, LEVOTHROID) 112 MCG tablet Take 224 mcg by mouth daily before breakfast.     . lisinopril (PRINIVIL,ZESTRIL) 40 MG tablet Take 40 mg by mouth daily.     . Multiple Vitamin (MULTIVITAMIN) tablet Take 1 tablet by mouth daily.    . Omega-3 Fatty Acids (FISH OIL) 1000 MG CAPS Take 4,000 mg by mouth daily.    Marland Kitchen  torsemide (DEMADEX) 10 MG tablet  Take 2 tablets (20 mg total) by mouth daily. 30 tablet 0  . vitamin B-12 (CYANOCOBALAMIN) 500 MCG tablet Take 500 mcg by mouth daily.     No current facility-administered medications for this visit.     Review of Systems: Review of Systems  Constitutional: Positive for fatigue. Negative for chills, diaphoresis, fever and unexpected weight change.  Cardiovascular: Positive for leg swelling.  All other systems reviewed and are negative.    PHYSICAL EXAMINATION Blood pressure (!) 194/81, pulse 79, temperature 98.2 F (36.8 C), temperature source Oral, resp. rate 17, height 5' 4"  (1.626 m), weight 144 lb 4.8 oz (65.5 kg), SpO2 99 %.  ECOG PERFORMANCE STATUS: 2 - Symptomatic, <50% confined to bed  Physical Exam  Constitutional: She is oriented to person, place, and time. She appears well-developed and well-nourished. No distress.  HENT:  Head: Normocephalic and atraumatic.  Mouth/Throat: Oropharynx is clear and moist. No oropharyngeal exudate.  Eyes: Pupils are equal, round, and reactive to light. Conjunctivae and EOM are normal. No scleral icterus.  Neck: No thyromegaly present.  Cardiovascular: Normal rate, regular rhythm, normal heart sounds and intact distal pulses. Exam reveals no gallop and no friction rub.  No murmur heard. Pulmonary/Chest: Effort normal and breath sounds normal. No stridor. No respiratory distress. She has no wheezes.  Abdominal: Soft. Bowel sounds are normal. She exhibits no distension and no mass. There is no tenderness. There is no guarding.  Musculoskeletal: She exhibits edema.  2+ bilateral lower extremity edema which is tender to palpation without palpable vascular cords.  Lymphadenopathy:    She has no cervical adenopathy.  Neurological: She is alert and oriented to person, place, and time. She displays normal reflexes. No cranial nerve deficit or sensory deficit.  Skin: Skin is warm and dry. No rash noted. She is not  diaphoretic. No erythema. No pallor.     LABORATORY DATA: I have personally reviewed the data as listed: Appointment on 07/15/2017  Component Date Value Ref Range Status  . Blood Bank Specimen 07/15/2017 SAMPLE AVAILABLE FOR TESTING   Final  . Sample Expiration 07/15/2017    Final                   Value:07/18/2017 Performed at [facility], [place] [address] [place], [place] [address]   . Sodium 07/15/2017 135* 136 - 145 mmol/L Final  . Potassium 07/15/2017 5.4* 3.5 - 5.1 mmol/L Final   NO VISIBLE HEMOLYSIS  . Chloride 07/15/2017 111* 98 - 109 mmol/L Final  . CO2 07/15/2017 22  22 - 29 mmol/L Final  . Glucose, Bld 07/15/2017 111  70 - 140 mg/dL Final  . BUN 07/15/2017 35* 7 - 26 mg/dL Final  . Creatinine 07/15/2017 1.46* 0.60 - 1.10 mg/dL Final  . Calcium 07/15/2017 9.0  8.4 - 10.4 mg/dL Final  . Total Protein 07/15/2017 5.8* 6.4 - 8.3 g/dL Final  . Albumin 07/15/2017 2.4* 3.5 - 5.0 g/dL Final  . AST 07/15/2017 22  5 - 34 U/L Final  . ALT 07/15/2017 21  0 - 55 U/L Final  . Alkaline Phosphatase 07/15/2017 69  40 - 150 U/L Final  . Total Bilirubin 07/15/2017 0.2  0.2 - 1.2 mg/dL Final  . GFR, Est Non Af Am 07/15/2017 35* >60 mL/min Final  . GFR, Est AFR Am 07/15/2017 41* >60 mL/min Final   Comment: (NOTE) The eGFR has been calculated using the CKD EPI equation. This calculation has not been validated in all clinical situations.  eGFR's persistently <60 mL/min signify possible Chronic Kidney Disease.   Georgiann Hahn gap 07/15/2017 2* 3 - 11 Final   Performed at [facility], [place] [address], [place], [place] [address]  . WBC Count 07/15/2017 2.1* 3.9 - 10.3 K/uL Final  . RBC 07/15/2017 2.70* 3.70 - 5.45 MIL/uL Final  . Hemoglobin 07/15/2017 7.8* 11.6 - 15.9 g/dL Final  . HCT 07/15/2017 24.5* 34.8 - 46.6 % Final  . MCV 07/15/2017 90.7  79.5 - 101.0 fL Final  . MCH 07/15/2017 28.9  25.1 - 34.0 pg Final  . MCHC 07/15/2017 31.8  31.5 - 36.0 g/dL  Final  . RDW 07/15/2017 14.3  11.2 - 14.5 % Final  . Platelet Count 07/15/2017 83* 145 - 400 K/uL Final  . Neutrophils Relative % 07/15/2017 83  % Final  . Neutro Abs 07/15/2017 1.7  1.5 - 6.5 K/uL Final  . Lymphocytes Relative 07/15/2017 11  % Final  . Lymphs Abs 07/15/2017 0.2* 0.9 - 3.3 K/uL Final  . Monocytes Relative 07/15/2017 3  % Final  . Monocytes Absolute 07/15/2017 0.1  0.1 - 0.9 K/uL Final  . Eosinophils Relative 07/15/2017 3  % Final  . Eosinophils Absolute 07/15/2017 0.1  0.0 - 0.5 K/uL Final  . Basophils Relative 07/15/2017 0  % Final  . Basophils Absolute 07/15/2017 0.0  0.0 - 0.1 K/uL Final   Performed at [facility], [place] [address], [place], [place] [address]       Ardath Sax, MD

## 2017-07-17 NOTE — Assessment & Plan Note (Addendum)
Following the results of the bone marrow biopsy demonstrating lymphoid aggregates and kappa light chain restricted plasma cells in the bone marrow, suspicion was raised for possible presence of lymphoplasmacytic lymphoma.  This is supported by findings of significant monoclonal IgM gammopathy with IgM kappa present in the peripheral blood.  Concurrently, patient has severe deficiency in the IgG and IgA likely due to suppression by the malignant clone.  Patient's myelodysplastic syndrome does not warrant therapy in itself, but patient is likely suffering from the lymphoplasmacytic lymphoma based on the significant gammopathy and concurrent cytopenias warranting initiation of therapy.  Plan: - PET/CT. -Consult interventional radiology for Infuse-a-Port placement. - Chemotherapy education. - Return to clinic in 2 weeks with labs, possible initiation of systemic chemotherapy with rituximab and bendamustine.  Due to poor functional status, we will consider reducing bendamustine dose by 50%.  Administering over a single day instead of 2 days.

## 2017-07-21 ENCOUNTER — Inpatient Hospital Stay: Payer: Medicare Other

## 2017-07-22 ENCOUNTER — Ambulatory Visit (HOSPITAL_COMMUNITY)
Admission: RE | Admit: 2017-07-22 | Discharge: 2017-07-22 | Disposition: A | Discharge status: Home / Self Care | Payer: Medicare Other | Source: Ambulatory Visit | Attending: Hematology and Oncology | Admitting: Hematology and Oncology

## 2017-07-22 DIAGNOSIS — J90 Pleural effusion, not elsewhere classified: Secondary | ICD-10-CM | POA: Diagnosis not present

## 2017-07-22 DIAGNOSIS — R188 Other ascites: Secondary | ICD-10-CM | POA: Insufficient documentation

## 2017-07-22 DIAGNOSIS — I313 Pericardial effusion (noninflammatory): Secondary | ICD-10-CM | POA: Diagnosis not present

## 2017-07-22 DIAGNOSIS — R161 Splenomegaly, not elsewhere classified: Secondary | ICD-10-CM | POA: Insufficient documentation

## 2017-07-22 DIAGNOSIS — Z79899 Other long term (current) drug therapy: Secondary | ICD-10-CM | POA: Insufficient documentation

## 2017-07-22 DIAGNOSIS — I517 Cardiomegaly: Secondary | ICD-10-CM | POA: Diagnosis not present

## 2017-07-22 DIAGNOSIS — Z79890 Hormone replacement therapy (postmenopausal): Secondary | ICD-10-CM | POA: Insufficient documentation

## 2017-07-22 DIAGNOSIS — C8300 Small cell B-cell lymphoma, unspecified site: Secondary | ICD-10-CM | POA: Insufficient documentation

## 2017-07-22 LAB — GLUCOSE, CAPILLARY: GLUCOSE-CAPILLARY: 111 mg/dL — AB (ref 65–99)

## 2017-07-22 MED ORDER — FLUDEOXYGLUCOSE F - 18 (FDG) INJECTION
7.2100 | Freq: Once | INTRAVENOUS | Status: AC | PRN
  Administered 2017-07-22: 7.21 via INTRAVENOUS

## 2017-07-24 ENCOUNTER — Other Ambulatory Visit: Payer: Self-pay | Admitting: Radiology

## 2017-07-24 ENCOUNTER — Other Ambulatory Visit: Payer: Self-pay | Admitting: Hematology and Oncology

## 2017-07-24 DIAGNOSIS — C8300 Small cell B-cell lymphoma, unspecified site: Secondary | ICD-10-CM

## 2017-07-24 MED ORDER — LORAZEPAM 0.5 MG PO TABS
0.5000 mg | ORAL_TABLET | Freq: Four times a day (QID) | ORAL | 0 refills | Status: DC | PRN
Start: 2017-07-24 — End: 2017-08-31

## 2017-07-24 MED ORDER — ALLOPURINOL 300 MG PO TABS: 300.0000 mg | ORAL_TABLET | Freq: Every day | ORAL | 3 refills | Status: DC

## 2017-07-24 MED ORDER — ACYCLOVIR 400 MG PO TABS: 400.0000 mg | ORAL_TABLET | Freq: Every day | ORAL | 3 refills | Status: DC

## 2017-07-24 MED ORDER — LIDOCAINE-PRILOCAINE 2.5-2.5 % EX CREA
TOPICAL_CREAM | CUTANEOUS | 3 refills | Status: DC
Start: 2017-07-24 — End: 2017-10-14

## 2017-07-24 MED ORDER — DEXAMETHASONE 4 MG PO TABS: 8.0000 mg | ORAL_TABLET | Freq: Every day | ORAL | 1 refills | Status: DC

## 2017-07-24 MED ORDER — ONDANSETRON HCL 8 MG PO TABS: 8.0000 mg | ORAL_TABLET | Freq: Two times a day (BID) | ORAL | 1 refills | Status: DC | PRN

## 2017-07-24 MED ORDER — PROCHLORPERAZINE MALEATE 10 MG PO TABS: 10.0000 mg | ORAL_TABLET | Freq: Four times a day (QID) | ORAL | 1 refills | Status: DC | PRN

## 2017-07-26 ENCOUNTER — Encounter (HOSPITAL_COMMUNITY): Payer: Self-pay

## 2017-07-26 ENCOUNTER — Ambulatory Visit (HOSPITAL_COMMUNITY)
Admission: RE | Admit: 2017-07-26 | Discharge: 2017-07-26 | Disposition: A | Discharge status: Home / Self Care | Payer: Medicare Other | Source: Ambulatory Visit | Attending: Hematology and Oncology | Admitting: Hematology and Oncology

## 2017-07-26 ENCOUNTER — Other Ambulatory Visit: Payer: Self-pay | Admitting: Hematology and Oncology

## 2017-07-26 DIAGNOSIS — M069 Rheumatoid arthritis, unspecified: Secondary | ICD-10-CM | POA: Diagnosis not present

## 2017-07-26 DIAGNOSIS — M310 Hypersensitivity angiitis: Secondary | ICD-10-CM | POA: Insufficient documentation

## 2017-07-26 DIAGNOSIS — D469 Myelodysplastic syndrome, unspecified: Secondary | ICD-10-CM

## 2017-07-26 DIAGNOSIS — I509 Heart failure, unspecified: Secondary | ICD-10-CM | POA: Diagnosis not present

## 2017-07-26 DIAGNOSIS — C8300 Small cell B-cell lymphoma, unspecified site: Secondary | ICD-10-CM

## 2017-07-26 DIAGNOSIS — E079 Disorder of thyroid, unspecified: Secondary | ICD-10-CM | POA: Diagnosis not present

## 2017-07-26 DIAGNOSIS — E119 Type 2 diabetes mellitus without complications: Secondary | ICD-10-CM | POA: Insufficient documentation

## 2017-07-26 DIAGNOSIS — I110 Hypertensive heart disease with heart failure: Secondary | ICD-10-CM | POA: Insufficient documentation

## 2017-07-26 DIAGNOSIS — Z882 Allergy status to sulfonamides status: Secondary | ICD-10-CM | POA: Diagnosis not present

## 2017-07-26 HISTORY — PX: IR FLUORO GUIDE PORT INSERTION RIGHT: IMG5741

## 2017-07-26 HISTORY — PX: IR US GUIDE VASC ACCESS RIGHT: IMG2390

## 2017-07-26 LAB — BASIC METABOLIC PANEL
Anion gap: 7 (ref 5–15)
BUN: 34 mg/dL — AB (ref 6–20)
CALCIUM: 8.9 mg/dL (ref 8.9–10.3)
CO2: 19 mmol/L — ABNORMAL LOW (ref 22–32)
Chloride: 113 mmol/L — ABNORMAL HIGH (ref 101–111)
Creatinine, Ser: 1.43 mg/dL — ABNORMAL HIGH (ref 0.44–1.00)
GFR calc Af Amer: 42 mL/min — ABNORMAL LOW (ref >60)
GFR, EST NON AFRICAN AMERICAN: 36 mL/min — AB (ref >60)
GLUCOSE: 145 mg/dL — AB (ref 65–99)
Potassium: 5.1 mmol/L (ref 3.5–5.1)
Sodium: 139 mmol/L (ref 135–145)

## 2017-07-26 LAB — CBC WITH DIFFERENTIAL/PLATELET
Basophils Absolute: 0 10*3/uL (ref 0.0–0.1)
Basophils Relative: 0 %
EOS PCT: 2 %
Eosinophils Absolute: 0 10*3/uL (ref 0.0–0.7)
HEMATOCRIT: 23.8 % — AB (ref 36.0–46.0)
HEMOGLOBIN: 7.7 g/dL — AB (ref 12.0–15.0)
Lymphocytes Relative: 7 %
Lymphs Abs: 0.2 10*3/uL — ABNORMAL LOW (ref 0.7–4.0)
MCH: 28.7 pg (ref 26.0–34.0)
MCHC: 32.4 g/dL (ref 30.0–36.0)
MCV: 88.8 fL (ref 78.0–100.0)
MONO ABS: 0.2 10*3/uL (ref 0.1–1.0)
Monocytes Relative: 6 %
Neutro Abs: 2.3 10*3/uL (ref 1.7–7.7)
Neutrophils Relative %: 85 %
Platelets: 106 10*3/uL — ABNORMAL LOW (ref 150–400)
RBC: 2.68 MIL/uL — ABNORMAL LOW (ref 3.87–5.11)
RDW: 14 % (ref 11.5–15.5)
WBC: 2.6 10*3/uL — ABNORMAL LOW (ref 4.0–10.5)

## 2017-07-26 LAB — PROTIME-INR
INR: 0.98
Prothrombin Time: 12.9 seconds (ref 11.4–15.2)

## 2017-07-26 LAB — GLUCOSE, CAPILLARY: GLUCOSE-CAPILLARY: 126 mg/dL — AB (ref 65–99)

## 2017-07-26 MED ORDER — MIDAZOLAM HCL 2 MG/2ML IJ SOLN
INTRAMUSCULAR | Status: AC | PRN
  Administered 2017-07-26: 1 mg via INTRAVENOUS

## 2017-07-26 MED ORDER — LIDOCAINE HCL 1 % IJ SOLN
INTRAMUSCULAR | Status: AC
  Filled 2017-07-26: qty 20

## 2017-07-26 MED ORDER — FENTANYL CITRATE (PF) 100 MCG/2ML IJ SOLN
INTRAMUSCULAR | Status: AC
  Filled 2017-07-26: qty 4

## 2017-07-26 MED ORDER — LIDOCAINE HCL 1 % IJ SOLN
INTRAMUSCULAR | Status: AC | PRN
  Administered 2017-07-26: 20 mL

## 2017-07-26 MED ORDER — LIDOCAINE-EPINEPHRINE (PF) 2 %-1:200000 IJ SOLN
INTRAMUSCULAR | Status: AC | PRN
  Administered 2017-07-26: 10 mL

## 2017-07-26 MED ORDER — ONDANSETRON HCL 4 MG/2ML IJ SOLN
INTRAMUSCULAR | Status: AC | PRN
  Administered 2017-07-26: 4 mg via INTRAVENOUS

## 2017-07-26 MED ORDER — CLINDAMYCIN PHOSPHATE 600 MG/50ML IV SOLN
600.0000 mg | Freq: Once | INTRAVENOUS | Status: AC
  Administered 2017-07-26: 600 mg via INTRAVENOUS
  Filled 2017-07-26: qty 50

## 2017-07-26 MED ORDER — ONDANSETRON HCL 4 MG/2ML IJ SOLN
INTRAMUSCULAR | Status: AC
  Filled 2017-07-26: qty 2

## 2017-07-26 MED ORDER — FENTANYL CITRATE (PF) 100 MCG/2ML IJ SOLN
INTRAMUSCULAR | Status: AC | PRN
  Administered 2017-07-26: 25 ug via INTRAVENOUS
  Administered 2017-07-26: 50 ug via INTRAVENOUS

## 2017-07-26 MED ORDER — MIDAZOLAM HCL 2 MG/2ML IJ SOLN
INTRAMUSCULAR | Status: AC
  Filled 2017-07-26: qty 4

## 2017-07-26 MED ORDER — HEPARIN SOD (PORK) LOCK FLUSH 100 UNIT/ML IV SOLN
INTRAVENOUS | Status: AC | PRN
  Administered 2017-07-26: 500 [IU] via INTRAVENOUS

## 2017-07-26 MED ORDER — LIDOCAINE-EPINEPHRINE (PF) 2 %-1:200000 IJ SOLN
INTRAMUSCULAR | Status: AC
  Filled 2017-07-26: qty 20

## 2017-07-26 MED ORDER — SODIUM CHLORIDE 0.9 % IV SOLN: INTRAVENOUS | Status: DC

## 2017-07-26 MED ORDER — HEPARIN SOD (PORK) LOCK FLUSH 100 UNIT/ML IV SOLN
INTRAVENOUS | Status: AC
  Filled 2017-07-26: qty 5

## 2017-07-26 NOTE — Procedures (Signed)
Placement of right jugular port.  Tip at SVC/RA junction.  Minimal blood loss and no immediate complication.  

## 2017-07-26 NOTE — Discharge Instructions (Signed)
Implanted Port Insertion, Care After °This sheet gives you information about how to care for yourself after your procedure. Your health care provider may also give you more specific instructions. If you have problems or questions, contact your health care provider. °What can I expect after the procedure? °After your procedure, it is common to have: °· Discomfort at the port insertion site. °· Bruising on the skin over the port. This should improve over 3-4 days. ° °Follow these instructions at home: °Port care °· After your port is placed, you will get a manufacturer's information card. The card has information about your port. Keep this card with you at all times. °· Take care of the port as told by your health care provider. Ask your health care provider if you or a family member can get training for taking care of the port at home. A home health care nurse may also take care of the port. °· Make sure to remember what type of port you have. °Incision care °· Follow instructions from your health care provider about how to take care of your port insertion site. Make sure you: °? Wash your hands with soap and water before you change your bandage (dressing). If soap and water are not available, use hand sanitizer. °? Change your dressing as told by your health care provider. °? Leave stitches (sutures), skin glue, or adhesive strips in place. These skin closures may need to stay in place for 2 weeks or longer. If adhesive strip edges start to loosen and curl up, you may trim the loose edges. Do not remove adhesive strips completely unless your health care provider tells you to do that. °· Check your port insertion site every day for signs of infection. Check for: °? More redness, swelling, or pain. °? More fluid or blood. °? Warmth. °? Pus or a bad smell. °General instructions °· Do not take baths, swim, or use a hot tub until your health care provider approves. °· Do not lift anything that is heavier than 10 lb (4.5  kg) for a week, or as told by your health care provider. °· Ask your health care provider when it is okay to: °? Return to work or school. °? Resume usual physical activities or sports. °· Do not drive for 24 hours if you were given a medicine to help you relax (sedative). °· Take over-the-counter and prescription medicines only as told by your health care provider. °· Wear a medical alert bracelet in case of an emergency. This will tell any health care providers that you have a port. °· Keep all follow-up visits as told by your health care provider. This is important. °Contact a health care provider if: °· You cannot flush your port with saline as directed, or you cannot draw blood from the port. °· You have a fever or chills. °· You have more redness, swelling, or pain around your port insertion site. °· You have more fluid or blood coming from your port insertion site. °· Your port insertion site feels warm to the touch. °· You have pus or a bad smell coming from the port insertion site. °Get help right away if: °· You have chest pain or shortness of breath. °· You have bleeding from your port that you cannot control. °Summary °· Take care of the port as told by your health care provider. °· Change your dressing as told by your health care provider. °· Keep all follow-up visits as told by your health care provider. °  This information is not intended to replace advice given to you by your health care provider. Make sure you discuss any questions you have with your health care provider. °Document Released: 12/20/2012 Document Revised: 01/21/2016 Document Reviewed: 01/21/2016 °Elsevier Interactive Patient Education © 2017 [employer] °Moderate Conscious Sedation, Adult, Care After °These instructions provide you with information about caring for yourself after your procedure. Your health care provider may also give you more specific instructions. Your treatment has been planned according to current medical  practices, but problems sometimes occur. Call your health care provider if you have any problems or questions after your procedure. °What can I expect after the procedure? °After your procedure, it is common: °· To feel sleepy for several hours. °· To feel clumsy and have poor balance for several hours. °· To have poor judgment for several hours. °· To vomit if you eat too soon. ° °Follow these instructions at home: °For at least 24 hours after the procedure: ° °· Do not: °? Participate in activities where you could fall or become injured. °? Drive. °? Use heavy machinery. °? Drink alcohol. °? Take sleeping pills or medicines that cause drowsiness. °? Make important decisions or sign legal documents. °? Take care of children on your own. °· Rest. °Eating and drinking °· Follow the diet recommended by your health care provider. °· If you vomit: °? Drink water, juice, or soup when you can drink without vomiting. °? Make sure you have little or no nausea before eating solid foods. °General instructions °· Have a responsible adult stay with you until you are awake and alert. °· Take over-the-counter and prescription medicines only as told by your health care provider. °· If you smoke, do not smoke without supervision. °· Keep all follow-up visits as told by your health care provider. This is important. °Contact a health care provider if: °· You keep feeling nauseous or you keep vomiting. °· You feel light-headed. °· You develop a rash. °· You have a fever. °Get help right away if: °· You have trouble breathing. °This information is not intended to replace advice given to you by your health care provider. Make sure you discuss any questions you have with your health care provider. °Document Released: 12/20/2012 Document Revised: 08/04/2015 Document Reviewed: 06/21/2015 °Elsevier Interactive Patient Education © 2018 [employer] ° °

## 2017-07-26 NOTE — H&P (Signed)
Referring Physician(s): Perlov,Mikhail G  Supervising Physician: Markus Daft  Patient Status:  [facility]  Chief Complaint:  "I'm here for a port"  Subjective: Patient familiar to IR service from prior gastrostomy tube placement on 07/11/2015, removal on 08/27/2015 as well as bone marrow biopsy on 06/22/2017.  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  She presents today for Port-A-Cath placement for additional treatment.  She currently denies fever, headache, chest pain, dyspnea, cough, back pain, nausea, vomiting or bleeding.  She does have some mild generalized abdominal discomfort. Past Medical History:  Diagnosis Date  . CHF (congestive heart failure) ([place])   . Diabetes mellitus   . Hypertension   . Thyroid disease   . Vasculitis [facility])    Past Surgical History:  Procedure Laterality Date  . MELANOMA EXCISION    . TUBAL LIGATION        Allergies: Benadryl [diphenhydramine]; Ciprofloxacin; Furosemide; Nitrofurantoin monohyd macro; Septra [bactrim]; Sulfa drugs cross reactors; and Ceclor [cefaclor]  Medications: Prior to Admission medications   Medication Sig Start Date End Date Taking? Authorizing Provider  acetaminophen (TYLENOL) 325 MG tablet Take 2 tablets (650 mg total) by mouth every 6 (six) hours as needed for fever. Patient taking differently: Take 650 mg by mouth daily.  09/19/15  Yes Theodis Blaze, MD  acyclovir (ZOVIRAX) 400 MG tablet Take 1 tablet (400 mg total) by mouth daily. 07/24/17  Yes Ardath Sax, MD  allopurinol (ZYLOPRIM) 300 MG tablet Take 1 tablet (300 mg total) by mouth daily. 07/24/17  Yes Perlov, Marinell Blight, MD  amLODipine (NORVASC) 2.5 MG tablet Take 2.5 mg by mouth daily.   Yes [provider]  Calcium-Magnesium-Vitamin D (CALCIUM 500 PO) Take 500 mg by mouth every morning.   Yes [provider]  carvedilol (COREG) 25 MG tablet Take 1 tablet (25 mg total) by  mouth 2 (two) times daily. 09/19/15  Yes Theodis Blaze, MD  cholecalciferol (VITAMIN D) 400 units TABS tablet Take 400 Units by mouth daily.   Yes [provider]  diltiazem (CARDIZEM) 90 MG tablet Take 90 mg by mouth 3 (three) times daily. Midnight, 6am, noon, 6pm    Yes [provider]  DULoxetine (CYMBALTA) 30 MG capsule Take 30 mg by mouth daily.   Yes [provider]  ferrous sulfate 325 (65 FE) MG tablet Take 325 mg by mouth 2 (two) times daily with a meal.   Yes [provider]  GLUCERNA (GLUCERNA) LIQD Take 237 mLs by mouth daily.    Yes [provider]  glucosamine-chondroitin 500-400 MG tablet Take 2 tablets by mouth daily.    Yes [provider]  hydrALAZINE (APRESOLINE) 100 MG tablet Take 100 mg by mouth 3 (three) times daily.   Yes [provider]  levothyroxine (SYNTHROID, LEVOTHROID) 112 MCG tablet Take 224 mcg by mouth daily before breakfast.    Yes [provider]  lisinopril (PRINIVIL,ZESTRIL) 40 MG tablet Take 40 mg by mouth daily.    Yes [provider]  Multiple Vitamin (MULTIVITAMIN) tablet Take 1 tablet by mouth daily.   Yes [provider]  Omega-3 Fatty Acids (FISH OIL) 1000 MG CAPS Take 4,000 mg by mouth daily.   Yes [provider]  torsemide (DEMADEX) 10 MG tablet Take 2 tablets (20 mg total) by mouth daily. 02/16/16  Yes Domenic Polite, MD  vitamin B-12 (CYANOCOBALAMIN) 500 MCG tablet Take 500 mcg by mouth daily.  Yes [provider]  dexamethasone (DECADRON) 4 MG tablet Take 2 tablets (8 mg total) by mouth daily. Start the day after bendamustine chemotherapy for 2 days. Take with food. 07/24/17   Ardath Sax, MD  gabapentin (NEURONTIN) 100 MG capsule Take 100 mg by mouth 2 (two) times daily.     [provider]  HYDROcodone-acetaminophen (NORCO/VICODIN) 5-325 MG tablet Take 1-2 tablets by mouth every 6 (six) hours as needed. 09/03/16   Ward, Delice Bison,  DO  lidocaine-prilocaine (EMLA) cream Apply to affected area once 07/24/17   Perlov, Marinell Blight, MD  LORazepam (ATIVAN) 0.5 MG tablet Take 1 tablet (0.5 mg total) by mouth every 6 (six) hours as needed (Nausea or vomiting). 07/24/17   Ardath Sax, MD  ondansetron (ZOFRAN) 8 MG tablet Take 1 tablet (8 mg total) by mouth 2 (two) times daily as needed for refractory nausea / vomiting. Start on day 2 after bendamustine chemotherapy. 07/24/17   Ardath Sax, MD  prochlorperazine (COMPAZINE) 10 MG tablet Take 1 tablet (10 mg total) by mouth every 6 (six) hours as needed (Nausea or vomiting). 07/24/17   Ardath Sax, MD     Vital Signs: BP (!) 165/89 (BP Location: Right Arm)   Pulse 69   Temp 98 F (36.7 C) (Oral)   Resp 16   Ht 5' 4"  (1.626 m)   Wt 144 lb (65.3 kg)   SpO2 92%   BMI 24.72 kg/m   Physical Exam patient awake, alert.  Chest with slightly diminished breath sounds bases, heart with regular rate and rhythm.  Abdomen soft, mild distention, positive bowel sounds, mild generalized tenderness to palpation; significant bilateral lower extremity edema noted  Imaging: Nm Pet Image Initial (pi) Whole Body  Result Date: 07/22/2017 CLINICAL DATA:  Initial treatment strategy for multiple myeloma versus lymphoplasmacytic lymphoma. EXAM: NUCLEAR MEDICINE PET WHOLE BODY TECHNIQUE: 7.21 mCi F-18 FDG was injected intravenously. Full-ring PET imaging was performed from the skull base to thigh after the radiotracer. CT data was obtained and used for attenuation correction and anatomic localization. Fasting blood glucose: 111 mg/dl COMPARISON:  CT abdomen/pelvis dated 01/28/2017. FINDINGS: Mediastinal blood pool activity: SUV max 1.5 HEAD/NECK: No hypermetabolic cervical lymphadenopathy. Incidental CT findings: none CHEST: Mild hypermetabolic thoracic lymph nodes, including: --AP window node with max SUV 2.4 --Left perihilar node max SUV 2.4 --Subcarinal node with max SUV 2.6 No suspicious  pulmonary nodules. Incidental CT findings: Cardiomegaly with moderate pericardial effusion. Atherosclerotic calcifications of the aortic arch. Coronary atherosclerosis the LAD. Small bilateral pleural effusions. ABDOMEN/PELVIS: No abnormal hypermetabolism in the liver, spleen, pancreas, or adrenal glands. Splenomegaly. No hypermetabolic lymphadenopathy in the abdomen/pelvis. Incidental CT findings: Moderate abdominopelvic lymphadenopathy. Atherosclerotic calcifications of the abdominal aorta and branch vessels. Body wall edema. SKELETON: No focal hypermetabolic activity to suggest skeletal metastasis. No suspicious lytic lesions to suggest active multiple myeloma. Incidental CT findings: none EXTREMITIES: No abnormal hypermetabolic activity in the lower extremities. Incidental CT findings: none IMPRESSION: No findings to suggest multiple myeloma. Mild hypermetabolic thoracic lymph nodes, as above. Splenomegaly. These findings may be related to the clinical history of possible lymphoplasmacytic lymphoma. Cardiomegaly with moderate pericardial effusion. Small bilateral pleural effusions. Moderate abdominopelvic ascites. Body wall edema. Electronically Signed   By: Julian Hy M.D.   On: 07/22/2017 14:25    Labs:  CBC: Recent Labs    10/27/16 1050 06/08/17 1147 06/22/17 0724 07/15/17 0758  WBC 3.6* 2.8* 2.4* 2.1*  HGB 9.6* 8.1* 7.4* 7.8*  HCT  28.8* 25.1* 22.7* 24.5*  PLT 124* 106* 89* 83*    COAGS: Recent Labs    06/22/17 0724  INR 0.92    BMP: Recent Labs    10/27/16 1050 06/08/17 1147 06/22/17 0724 07/15/17 0758  NA 138 136 137 135*  K 5.2* 5.0 4.8 5.4*  CL 106 111* 110 111*  CO2 24 20* 19* 22  GLUCOSE 240* 130 127* 111  BUN 45* 27* 38* 35*  CALCIUM 9.6 9.1 9.1 9.0  CREATININE 1.33* 1.30* 1.50* 1.46*  GFRNONAA 39* 41* 34* 35*  GFRAA 46* 47* 40* 41*    LIVER FUNCTION TESTS: Recent Labs    09/02/16 2205 10/27/16 1200 06/08/17 1147 07/15/17 0758  BILITOT 0.5 0.8  0.3 0.2  AST 18 18 18 22   ALT 17 21 17 21   ALKPHOS 43 49 64 69  PROT 7.0 6.3* 6.0* 5.8*  ALBUMIN 3.5 3.2* 2.5* 2.4*    Assessment and Plan: Pt with history of rheumatoid arthritis, leukocytoclastic  vasculitis and 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  She presents today for Port-A-Cath placement for additional treatment.Risks and benefits of image guided port-a-catheter placement was discussed with the patient/daughter including, but not limited to bleeding, infection, pneumothorax, or fibrin sheath development and need for additional procedures.  All of the patient's questions were answered, patient is agreeable to proceed. Consent signed and in chart.  Labs pending   Electronically Signed: D. Rowe Robert, PA-C 07/26/2017, 1:01 PM   I spent a total of 25 minutes at the the patient's bedside AND on the patient's hospital floor or unit, greater than 50% of which was counseling/coordinating care for Port-A-Cath placement

## 2017-07-28 ENCOUNTER — Encounter (HOSPITAL_COMMUNITY): Payer: Self-pay | Admitting: Hematology and Oncology

## 2017-08-02 ENCOUNTER — Inpatient Hospital Stay: Payer: Medicare Other

## 2017-08-02 ENCOUNTER — Inpatient Hospital Stay (HOSPITAL_BASED_OUTPATIENT_CLINIC_OR_DEPARTMENT_OTHER): Payer: Medicare Other | Admitting: Hematology and Oncology

## 2017-08-02 ENCOUNTER — Telehealth: Payer: Self-pay

## 2017-08-02 VITALS — BP 192/85 | HR 87 | Temp 97.7°F | Resp 17

## 2017-08-02 VITALS — BP 190/76 | HR 75 | Temp 98.6°F | Resp 18 | Ht 64.0 in | Wt 157.4 lb

## 2017-08-02 DIAGNOSIS — Z79899 Other long term (current) drug therapy: Secondary | ICD-10-CM | POA: Diagnosis not present

## 2017-08-02 DIAGNOSIS — C8300 Small cell B-cell lymphoma, unspecified site: Secondary | ICD-10-CM

## 2017-08-02 DIAGNOSIS — Z5111 Encounter for antineoplastic chemotherapy: Secondary | ICD-10-CM

## 2017-08-02 DIAGNOSIS — D801 Nonfamilial hypogammaglobulinemia: Secondary | ICD-10-CM | POA: Diagnosis not present

## 2017-08-02 DIAGNOSIS — M310 Hypersensitivity angiitis: Secondary | ICD-10-CM

## 2017-08-02 DIAGNOSIS — Z5112 Encounter for antineoplastic immunotherapy: Secondary | ICD-10-CM | POA: Diagnosis not present

## 2017-08-02 DIAGNOSIS — D472 Monoclonal gammopathy: Secondary | ICD-10-CM | POA: Diagnosis not present

## 2017-08-02 DIAGNOSIS — E60 Dietary zinc deficiency: Secondary | ICD-10-CM

## 2017-08-02 DIAGNOSIS — M069 Rheumatoid arthritis, unspecified: Secondary | ICD-10-CM | POA: Diagnosis not present

## 2017-08-02 DIAGNOSIS — D469 Myelodysplastic syndrome, unspecified: Secondary | ICD-10-CM

## 2017-08-02 DIAGNOSIS — D61818 Other pancytopenia: Secondary | ICD-10-CM

## 2017-08-02 LAB — CMP (CANCER CENTER ONLY)
ALBUMIN: 2.3 g/dL — AB (ref 3.5–5.0)
ALT: 18 U/L (ref 0–55)
AST: 20 U/L (ref 5–34)
Alkaline Phosphatase: 57 U/L (ref 40–150)
Anion gap: 4 (ref 3–11)
BUN: 32 mg/dL — ABNORMAL HIGH (ref 7–26)
CHLORIDE: 113 mmol/L — AB (ref 98–109)
CO2: 20 mmol/L — AB (ref 22–29)
CREATININE: 1.32 mg/dL — AB (ref 0.60–1.10)
Calcium: 8.6 mg/dL (ref 8.4–10.4)
GFR, Est AFR Am: 46 mL/min — ABNORMAL LOW (ref >60)
GFR, Estimated: 40 mL/min — ABNORMAL LOW (ref >60)
GLUCOSE: 118 mg/dL (ref 70–140)
POTASSIUM: 4.7 mmol/L (ref 3.5–5.1)
Sodium: 137 mmol/L (ref 136–145)
Total Bilirubin: 0.2 mg/dL (ref 0.2–1.2)
Total Protein: 5.8 g/dL — ABNORMAL LOW (ref 6.4–8.3)

## 2017-08-02 LAB — MAGNESIUM: Magnesium: 2.3 mg/dL (ref 1.7–2.4)

## 2017-08-02 LAB — CBC WITH DIFFERENTIAL (CANCER CENTER ONLY)
BASOS ABS: 0 10*3/uL (ref 0.0–0.1)
Basophils Relative: 1 %
EOS PCT: 3 %
Eosinophils Absolute: 0.1 10*3/uL (ref 0.0–0.5)
HEMATOCRIT: 23.1 % — AB (ref 34.8–46.6)
HEMOGLOBIN: 7.7 g/dL — AB (ref 11.6–15.9)
LYMPHS ABS: 0.2 10*3/uL — AB (ref 0.9–3.3)
Lymphocytes Relative: 7 %
MCH: 29.4 pg (ref 25.1–34.0)
MCHC: 33.4 g/dL (ref 31.5–36.0)
MCV: 88.1 fL (ref 79.5–101.0)
Monocytes Absolute: 0.2 10*3/uL (ref 0.1–0.9)
Monocytes Relative: 7 %
NEUTROS ABS: 1.9 10*3/uL (ref 1.5–6.5)
NEUTROS PCT: 82 %
PLATELETS: 92 10*3/uL — AB (ref 145–400)
RBC: 2.62 MIL/uL — ABNORMAL LOW (ref 3.70–5.45)
RDW: 14.7 % — ABNORMAL HIGH (ref 11.2–14.5)
WBC Count: 2.3 10*3/uL — ABNORMAL LOW (ref 3.9–10.3)

## 2017-08-02 LAB — LACTATE DEHYDROGENASE: LDH: 183 U/L (ref 125–245)

## 2017-08-02 LAB — URIC ACID: URIC ACID, SERUM: 4.1 mg/dL (ref 2.6–7.4)

## 2017-08-02 MED ORDER — SODIUM CHLORIDE 0.9 % IV SOLN
Freq: Once | INTRAVENOUS | Status: AC
  Administered 2017-08-02: 10:00:00 via INTRAVENOUS

## 2017-08-02 MED ORDER — SODIUM CHLORIDE 0.9 % IV SOLN
90.0000 mg/m2 | Freq: Once | INTRAVENOUS | Status: AC
  Administered 2017-08-02: 150 mg via INTRAVENOUS
  Filled 2017-08-02: qty 6

## 2017-08-02 MED ORDER — ACETAMINOPHEN 325 MG PO TABS
ORAL_TABLET | ORAL | Status: AC
  Filled 2017-08-02: qty 2

## 2017-08-02 MED ORDER — ACETAMINOPHEN 325 MG PO TABS
650.0000 mg | ORAL_TABLET | Freq: Once | ORAL | Status: AC
  Administered 2017-08-02: 650 mg via ORAL

## 2017-08-02 MED ORDER — SODIUM CHLORIDE 0.9% FLUSH
10.0000 mL | INTRAVENOUS | Status: DC | PRN
  Administered 2017-08-02: 10 mL
  Filled 2017-08-02: qty 10

## 2017-08-02 MED ORDER — PALONOSETRON HCL INJECTION 0.25 MG/5ML
0.2500 mg | Freq: Once | INTRAVENOUS | Status: AC
  Administered 2017-08-02: 0.25 mg via INTRAVENOUS

## 2017-08-02 MED ORDER — DEXAMETHASONE SODIUM PHOSPHATE 10 MG/ML IJ SOLN
INTRAMUSCULAR | Status: AC
  Filled 2017-08-02: qty 1

## 2017-08-02 MED ORDER — DIPHENHYDRAMINE HCL 25 MG PO CAPS
ORAL_CAPSULE | ORAL | Status: AC
Start: 2017-08-02 — End: ?
  Filled 2017-08-02: qty 2

## 2017-08-02 MED ORDER — SODIUM CHLORIDE 0.9 % IV SOLN
375.0000 mg/m2 | Freq: Once | INTRAVENOUS | Status: AC
  Administered 2017-08-02: 600 mg via INTRAVENOUS
  Filled 2017-08-02: qty 50

## 2017-08-02 MED ORDER — HEPARIN SOD (PORK) LOCK FLUSH 100 UNIT/ML IV SOLN
500.0000 [IU] | Freq: Once | INTRAVENOUS | Status: AC | PRN
  Administered 2017-08-02: 500 [IU]
  Filled 2017-08-02: qty 5

## 2017-08-02 MED ORDER — PALONOSETRON HCL INJECTION 0.25 MG/5ML
INTRAVENOUS | Status: AC
Start: 2017-08-02 — End: ?
  Filled 2017-08-02: qty 5

## 2017-08-02 MED ORDER — DEXAMETHASONE SODIUM PHOSPHATE 10 MG/ML IJ SOLN
10.0000 mg | Freq: Once | INTRAMUSCULAR | Status: AC
  Administered 2017-08-02: 10 mg via INTRAVENOUS

## 2017-08-02 NOTE — Patient Instructions (Signed)
[facility] Discharge Instructions for Patients Receiving Chemotherapy  Today you received the following chemotherapy agents Rituxan, Bendeka.   To help prevent nausea and vomiting after your treatment, we encourage you to take your nausea medication as prescribed.    If you develop nausea and vomiting that is not controlled by your nausea medication, call the clinic.   BELOW ARE SYMPTOMS THAT SHOULD BE REPORTED IMMEDIATELY:  *FEVER GREATER THAN 100.5 F  *CHILLS WITH OR WITHOUT FEVER  NAUSEA AND VOMITING THAT IS NOT CONTROLLED WITH YOUR NAUSEA MEDICATION  *UNUSUAL SHORTNESS OF BREATH  *UNUSUAL BRUISING OR BLEEDING  TENDERNESS IN MOUTH AND THROAT WITH OR WITHOUT PRESENCE OF ULCERS  *URINARY PROBLEMS  *BOWEL PROBLEMS  UNUSUAL RASH Items with * indicate a potential emergency and should be followed up as soon as possible.  Feel free to call the clinic should you have any questions or concerns. The clinic phone number is [phone].  Please show the [facility] at check-in to the Emergency Department and triage nurse.    Rituximab injection What is this medicine? RITUXIMAB (ri TUX i mab) is a monoclonal antibody. It is used to treat certain types of cancer like non-Hodgkin lymphoma and chronic lymphocytic leukemia. It is also used to treat rheumatoid arthritis, granulomatosis with polyangiitis (or Wegener's granulomatosis), and microscopic polyangiitis. This medicine may be used for other purposes; ask your health care provider or pharmacist if you have questions. COMMON BRAND NAME(S): Rituxan What should I tell my health care provider before I take this medicine? They need to know if you have any of these conditions: -heart disease -infection (especially a virus infection such as hepatitis B, chickenpox, cold sores, or herpes) -immune system problems -irregular heartbeat -kidney disease -lung or breathing disease, like asthma -recently received or  scheduled to receive a vaccine -an unusual or allergic reaction to rituximab, mouse proteins, other medicines, foods, dyes, or preservatives -pregnant or trying to get pregnant -breast-feeding How should I use this medicine? This medicine is for infusion into a vein. It is administered in a hospital or clinic by a specially trained health care professional. A special MedGuide will be given to you by the pharmacist with each prescription and refill. Be sure to read this information carefully each time. Talk to your pediatrician regarding the use of this medicine in children. This medicine is not approved for use in children. Overdosage: If you think you have taken too much of this medicine contact a poison control center or emergency room at once. NOTE: This medicine is only for you. Do not share this medicine with others. What if I miss a dose? It is important not to miss a dose. Call your doctor or health care professional if you are unable to keep an appointment. What may interact with this medicine? -cisplatin -other medicines for arthritis like disease modifying antirheumatic drugs or tumor necrosis factor inhibitors -live virus vaccines This list may not describe all possible interactions. Give your health care provider a list of all the medicines, herbs, non-prescription drugs, or dietary supplements you use. Also tell them if you smoke, drink alcohol, or use illegal drugs. Some items may interact with your medicine. What should I watch for while using this medicine? Your condition will be monitored carefully while you are receiving this medicine. You may need blood work done while you are taking this medicine. This medicine can cause serious allergic reactions. To reduce your risk you may need to take medicine before treatment with  this medicine. Take your medicine as directed. In some patients, this medicine may cause a serious brain infection that may cause death. If you have any  problems seeing, thinking, speaking, walking, or standing, tell your doctor right away. If you cannot reach your doctor, urgently seek other source of medical care. Call your doctor or health care professional for advice if you get a fever, chills or sore throat, or other symptoms of a cold or flu. Do not treat yourself. This drug decreases your body's ability to fight infections. Try to avoid being around people who are sick. Do not become pregnant while taking this medicine or for 12 months after stopping it. Women should inform their doctor if they wish to become pregnant or think they might be pregnant. There is a potential for serious side effects to an unborn child. Talk to your health care professional or pharmacist for more information. What side effects may I notice from receiving this medicine? Side effects that you should report to your doctor or health care professional as soon as possible: -breathing problems -chest pain -dizziness or feeling faint -fast, irregular heartbeat -low blood counts - this medicine may decrease the number of white blood cells, red blood cells and platelets. You may be at increased risk for infections and bleeding. -mouth sores -redness, blistering, peeling or loosening of the skin, including inside the mouth (this can be added for any serious or exfoliative rash that could lead to hospitalization) -signs of infection - fever or chills, cough, sore throat, pain or difficulty passing urine -signs and symptoms of kidney injury like trouble passing urine or change in the amount of urine -signs and symptoms of liver injury like dark yellow or brown urine; general ill feeling or flu-like symptoms; light-colored stools; loss of appetite; nausea; right upper belly pain; unusually weak or tired; yellowing of the eyes or skin -stomach pain -vomiting Side effects that usually do not require medical attention (report to your doctor or health care professional if they  continue or are bothersome): -headache -joint pain -muscle cramps or muscle pain This list may not describe all possible side effects. Call your doctor for medical advice about side effects. You may report side effects to FDA at 1-800-FDA-1088. Where should I keep my medicine? This drug is given in a hospital or clinic and will not be stored at home. NOTE: This sheet is a summary. It may not cover all possible information. If you have questions about this medicine, talk to your doctor, pharmacist, or health care provider.  2018 Elsevier/Gold Standard (2015-10-08 15:28:09)    Bendamustine Injection What is this medicine? BENDAMUSTINE (BEN da MUS teen) is a chemotherapy drug. It is used to treat chronic lymphocytic leukemia and non-Hodgkin lymphoma. This medicine may be used for other purposes; ask your health care provider or pharmacist if you have questions. COMMON BRAND NAME(S): BENDEKA, Treanda What should I tell my health care provider before I take this medicine? They need to know if you have any of these conditions: -infection (especially a virus infection such as chickenpox, cold sores, or herpes) -kidney disease -liver disease -an unusual or allergic reaction to bendamustine, mannitol, other medicines, foods, dyes, or preservatives -pregnant or trying to get pregnant -breast-feeding How should I use this medicine? This medicine is for infusion into a vein. It is given by a health care professional in a hospital or clinic setting. Talk to your pediatrician regarding the use of this medicine in children. Special care may be needed. Overdosage:  If you think you have taken too much of this medicine contact a poison control center or emergency room at once. NOTE: This medicine is only for you. Do not share this medicine with others. What if I miss a dose? It is important not to miss your dose. Call your doctor or health care professional if you are unable to keep an  appointment. What may interact with this medicine? Do not take this medicine with any of the following medications: -clozapine This medicine may also interact with the following medications: -atazanavir -cimetidine -ciprofloxacin -enoxacin -fluvoxamine -medicines for seizures like carbamazepine and phenobarbital -mexiletine -rifampin -tacrine -thiabendazole -zileuton This list may not describe all possible interactions. Give your health care provider a list of all the medicines, herbs, non-prescription drugs, or dietary supplements you use. Also tell them if you smoke, drink alcohol, or use illegal drugs. Some items may interact with your medicine. What should I watch for while using this medicine? This drug may make you feel generally unwell. This is not uncommon, as chemotherapy can affect healthy cells as well as cancer cells. Report any side effects. Continue your course of treatment even though you feel ill unless your doctor tells you to stop. You may need blood work done while you are taking this medicine. Call your doctor or health care professional for advice if you get a fever, chills or sore throat, or other symptoms of a cold or flu. Do not treat yourself. This drug decreases your body's ability to fight infections. Try to avoid being around people who are sick. This medicine may increase your risk to bruise or bleed. Call your doctor or health care professional if you notice any unusual bleeding. Talk to your doctor about your risk of cancer. You may be more at risk for certain types of cancers if you take this medicine. Do not become pregnant while taking this medicine or for 3 months after stopping it. Women should inform their doctor if they wish to become pregnant or think they might be pregnant. Men should not father a child while taking this medicine and for 3 months after stopping it.There is a potential for serious side effects to an unborn child. Talk to your health care  professional or pharmacist for more information. Do not breast-feed an infant while taking this medicine. This medicine may interfere with the ability to have a child. You should talk with your doctor or health care professional if you are concerned about your fertility. What side effects may I notice from receiving this medicine? Side effects that you should report to your doctor or health care professional as soon as possible: -allergic reactions like skin rash, itching or hives, swelling of the face, lips, or tongue -low blood counts - this medicine may decrease the number of white blood cells, red blood cells and platelets. You may be at increased risk for infections and bleeding. -redness, blistering, peeling or loosening of the skin, including inside the mouth -signs of infection - fever or chills, cough, sore throat, pain or difficulty passing urine -signs of decreased platelets or bleeding - bruising, pinpoint red spots on the skin, black, tarry stools, blood in the urine -signs of decreased red blood cells - unusually weak or tired, fainting spells, lightheadedness -signs and symptoms of kidney injury like trouble passing urine or change in the amount of urine -signs and symptoms of liver injury like dark yellow or brown urine; general ill feeling or flu-like symptoms; light-colored stools; loss of appetite; nausea; right  upper belly pain; unusually weak or tired; yellowing of the eyes or skin Side effects that usually do not require medical attention (report to your doctor or health care professional if they continue or are bothersome): -constipation -decreased appetite -diarrhea -headache -mouth sores -nausea/vomiting -tiredness This list may not describe all possible side effects. Call your doctor for medical advice about side effects. You may report side effects to FDA at 1-800-FDA-1088. Where should I keep my medicine? This drug is given in a hospital or clinic and will not be  stored at home. NOTE: This sheet is a summary. It may not cover all possible information. If you have questions about this medicine, talk to your doctor, pharmacist, or health care provider.  2018 Elsevier/Gold Standard (2015-01-02 08:45:41)

## 2017-08-02 NOTE — Telephone Encounter (Signed)
Printed avs and calender of upcoming appointment.  Per 5/21 los 

## 2017-08-02 NOTE — Progress Notes (Signed)
Per Dr. Lebron Conners, ok to treat with platelets of 92.

## 2017-08-03 ENCOUNTER — Telehealth: Payer: Self-pay

## 2017-08-03 LAB — HEPATITIS C ANTIBODY

## 2017-08-03 LAB — IGG: IgG (Immunoglobin G), Serum: 273 mg/dL — ABNORMAL LOW (ref 700–1600)

## 2017-08-03 LAB — HEPATITIS B SURFACE ANTIGEN: HEP B S AG: NEGATIVE

## 2017-08-03 LAB — HEPATITIS B SURFACE ANTIBODY,QUALITATIVE: HEP B S AB: NONREACTIVE

## 2017-08-03 LAB — HEPATITIS B CORE ANTIBODY, TOTAL: Hep B Core Total Ab: POSITIVE — AB

## 2017-08-03 LAB — BETA 2 MICROGLOBULIN, SERUM: Beta-2 Microglobulin: 5.5 mg/L — ABNORMAL HIGH (ref 0.6–2.4)

## 2017-08-03 NOTE — Telephone Encounter (Signed)
First time chemo follow-up call. Patient is doing well and voices no complaints. Patient aware to call the office with any question or concerns. Verbalized understanding.

## 2017-08-10 ENCOUNTER — Encounter: Payer: Self-pay | Admitting: Hematology and Oncology

## 2017-08-10 ENCOUNTER — Inpatient Hospital Stay (HOSPITAL_BASED_OUTPATIENT_CLINIC_OR_DEPARTMENT_OTHER): Payer: Medicare Other | Admitting: Hematology and Oncology

## 2017-08-10 ENCOUNTER — Encounter (HOSPITAL_COMMUNITY): Payer: Medicare Other

## 2017-08-10 ENCOUNTER — Telehealth: Payer: Self-pay | Admitting: Hematology and Oncology

## 2017-08-10 ENCOUNTER — Inpatient Hospital Stay: Payer: Medicare Other

## 2017-08-10 VITALS — BP 188/75 | HR 81 | Temp 98.7°F | Resp 18 | Ht 64.0 in | Wt 167.8 lb

## 2017-08-10 DIAGNOSIS — D61818 Other pancytopenia: Secondary | ICD-10-CM | POA: Diagnosis not present

## 2017-08-10 DIAGNOSIS — M069 Rheumatoid arthritis, unspecified: Secondary | ICD-10-CM

## 2017-08-10 DIAGNOSIS — Z5112 Encounter for antineoplastic immunotherapy: Secondary | ICD-10-CM | POA: Diagnosis not present

## 2017-08-10 DIAGNOSIS — M310 Hypersensitivity angiitis: Secondary | ICD-10-CM | POA: Diagnosis not present

## 2017-08-10 DIAGNOSIS — D472 Monoclonal gammopathy: Secondary | ICD-10-CM

## 2017-08-10 DIAGNOSIS — D801 Nonfamilial hypogammaglobulinemia: Secondary | ICD-10-CM | POA: Diagnosis not present

## 2017-08-10 DIAGNOSIS — Z79899 Other long term (current) drug therapy: Secondary | ICD-10-CM

## 2017-08-10 DIAGNOSIS — C8300 Small cell B-cell lymphoma, unspecified site: Secondary | ICD-10-CM | POA: Diagnosis not present

## 2017-08-10 DIAGNOSIS — D469 Myelodysplastic syndrome, unspecified: Secondary | ICD-10-CM | POA: Diagnosis not present

## 2017-08-10 LAB — CMP (CANCER CENTER ONLY)
ALT: 18 U/L (ref 0–55)
ANION GAP: 6 (ref 3–11)
AST: 20 U/L (ref 5–34)
Albumin: 2.1 g/dL — ABNORMAL LOW (ref 3.5–5.0)
Alkaline Phosphatase: 47 U/L (ref 40–150)
BUN: 31 mg/dL — AB (ref 7–26)
CHLORIDE: 111 mmol/L — AB (ref 98–109)
CO2: 18 mmol/L — ABNORMAL LOW (ref 22–29)
Calcium: 8.3 mg/dL — ABNORMAL LOW (ref 8.4–10.4)
Creatinine: 1.2 mg/dL — ABNORMAL HIGH (ref 0.60–1.10)
GFR, EST NON AFRICAN AMERICAN: 45 mL/min — AB (ref >60)
GFR, Est AFR Am: 52 mL/min — ABNORMAL LOW (ref >60)
Glucose, Bld: 112 mg/dL (ref 70–140)
POTASSIUM: 4.8 mmol/L (ref 3.5–5.1)
Sodium: 135 mmol/L — ABNORMAL LOW (ref 136–145)
Total Bilirubin: 0.2 mg/dL (ref 0.2–1.2)
Total Protein: 5.1 g/dL — ABNORMAL LOW (ref 6.4–8.3)

## 2017-08-10 LAB — CBC WITH DIFFERENTIAL (CANCER CENTER ONLY)
BASOS PCT: 1 %
Basophils Absolute: 0 10*3/uL (ref 0.0–0.1)
Eosinophils Absolute: 0.1 10*3/uL (ref 0.0–0.5)
Eosinophils Relative: 4 %
HEMATOCRIT: 23.2 % — AB (ref 34.8–46.6)
HEMOGLOBIN: 7.6 g/dL — AB (ref 11.6–15.9)
Lymphocytes Relative: 5 %
Lymphs Abs: 0.1 10*3/uL — ABNORMAL LOW (ref 0.9–3.3)
MCH: 28.9 pg (ref 25.1–34.0)
MCHC: 32.8 g/dL (ref 31.5–36.0)
MCV: 88.2 fL (ref 79.5–101.0)
MONO ABS: 0.3 10*3/uL (ref 0.1–0.9)
Monocytes Relative: 9 %
NEUTROS ABS: 2.3 10*3/uL (ref 1.5–6.5)
NEUTROS PCT: 81 %
Platelet Count: 85 10*3/uL — ABNORMAL LOW (ref 145–400)
RBC: 2.63 MIL/uL — ABNORMAL LOW (ref 3.70–5.45)
RDW: 14.8 % — AB (ref 11.2–14.5)
WBC Count: 2.8 10*3/uL — ABNORMAL LOW (ref 3.9–10.3)

## 2017-08-10 LAB — PHOSPHORUS: PHOSPHORUS: 4 mg/dL (ref 2.5–4.6)

## 2017-08-10 LAB — MAGNESIUM: MAGNESIUM: 2.3 mg/dL (ref 1.7–2.4)

## 2017-08-10 LAB — URIC ACID: Uric Acid, Serum: 5.7 mg/dL (ref 2.6–7.4)

## 2017-08-10 LAB — SAMPLE TO BLOOD BANK

## 2017-08-10 MED ORDER — SODIUM CHLORIDE 0.9% FLUSH
10.0000 mL | Freq: Once | INTRAVENOUS | Status: AC
  Administered 2017-08-10: 10 mL via INTRAVENOUS
  Filled 2017-08-10: qty 10

## 2017-08-10 MED ORDER — HEPARIN SOD (PORK) LOCK FLUSH 100 UNIT/ML IV SOLN
500.0000 [IU] | Freq: Once | INTRAVENOUS | Status: AC
  Administered 2017-08-10: 500 [IU] via INTRAVENOUS
  Filled 2017-08-10: qty 5

## 2017-08-10 NOTE — Telephone Encounter (Signed)
Scheduled appt per 5/29 los  - per patient request no print out needed.

## 2017-08-14 ENCOUNTER — Other Ambulatory Visit: Payer: Self-pay

## 2017-08-14 ENCOUNTER — Emergency Department (HOSPITAL_COMMUNITY)
Admission: EM | Admit: 2017-08-14 | Discharge: 2017-08-14 | Disposition: A | Discharge status: Home / Self Care | Payer: Medicare Other | Attending: Emergency Medicine | Admitting: Emergency Medicine

## 2017-08-14 ENCOUNTER — Encounter (HOSPITAL_COMMUNITY): Payer: Self-pay

## 2017-08-14 ENCOUNTER — Encounter: Payer: Self-pay | Admitting: Hematology and Oncology

## 2017-08-14 ENCOUNTER — Emergency Department (HOSPITAL_COMMUNITY): Payer: Medicare Other

## 2017-08-14 DIAGNOSIS — E119 Type 2 diabetes mellitus without complications: Secondary | ICD-10-CM | POA: Insufficient documentation

## 2017-08-14 DIAGNOSIS — C8590 Non-Hodgkin lymphoma, unspecified, unspecified site: Secondary | ICD-10-CM | POA: Insufficient documentation

## 2017-08-14 DIAGNOSIS — R0602 Shortness of breath: Secondary | ICD-10-CM | POA: Diagnosis present

## 2017-08-14 DIAGNOSIS — Z79899 Other long term (current) drug therapy: Secondary | ICD-10-CM | POA: Insufficient documentation

## 2017-08-14 DIAGNOSIS — I509 Heart failure, unspecified: Secondary | ICD-10-CM | POA: Diagnosis not present

## 2017-08-14 DIAGNOSIS — I10 Essential (primary) hypertension: Secondary | ICD-10-CM | POA: Insufficient documentation

## 2017-08-14 DIAGNOSIS — I5033 Acute on chronic diastolic (congestive) heart failure: Secondary | ICD-10-CM | POA: Diagnosis not present

## 2017-08-14 LAB — COMPREHENSIVE METABOLIC PANEL
ALK PHOS: 42 U/L (ref 38–126)
ALT: 18 U/L (ref 14–54)
AST: 14 U/L — ABNORMAL LOW (ref 15–41)
Albumin: 2 g/dL — ABNORMAL LOW (ref 3.5–5.0)
Anion gap: 4 — ABNORMAL LOW (ref 5–15)
BUN: 41 mg/dL — ABNORMAL HIGH (ref 6–20)
CALCIUM: 7.9 mg/dL — AB (ref 8.9–10.3)
CO2: 20 mmol/L — ABNORMAL LOW (ref 22–32)
CREATININE: 1.4 mg/dL — AB (ref 0.44–1.00)
Chloride: 108 mmol/L (ref 101–111)
GFR, EST AFRICAN AMERICAN: 43 mL/min — AB (ref >60)
GFR, EST NON AFRICAN AMERICAN: 37 mL/min — AB (ref >60)
Glucose, Bld: 152 mg/dL — ABNORMAL HIGH (ref 65–99)
Potassium: 4.3 mmol/L (ref 3.5–5.1)
Sodium: 132 mmol/L — ABNORMAL LOW (ref 135–145)
TOTAL PROTEIN: 4.7 g/dL — AB (ref 6.5–8.1)
Total Bilirubin: 0.3 mg/dL (ref 0.3–1.2)

## 2017-08-14 LAB — URINALYSIS, ROUTINE W REFLEX MICROSCOPIC
Bilirubin Urine: NEGATIVE
GLUCOSE, UA: 50 mg/dL — AB
Ketones, ur: NEGATIVE mg/dL
NITRITE: NEGATIVE
SPECIFIC GRAVITY, URINE: 1.014 (ref 1.005–1.030)
WBC, UA: >50 WBC/hpf — ABNORMAL HIGH (ref 0–5)
pH: 6 (ref 5.0–8.0)

## 2017-08-14 LAB — CBC WITH DIFFERENTIAL/PLATELET
BASOS ABS: 0 10*3/uL (ref 0.0–0.1)
BASOS PCT: 0 %
EOS ABS: 0.1 10*3/uL (ref 0.0–0.7)
EOS PCT: 2 %
HCT: 23.1 % — ABNORMAL LOW (ref 36.0–46.0)
Hemoglobin: 7.7 g/dL — ABNORMAL LOW (ref 12.0–15.0)
LYMPHS ABS: 0.2 10*3/uL — AB (ref 0.7–4.0)
Lymphocytes Relative: 7 %
MCH: 29.4 pg (ref 26.0–34.0)
MCHC: 33.3 g/dL (ref 30.0–36.0)
MCV: 88.2 fL (ref 78.0–100.0)
Monocytes Absolute: 0.3 10*3/uL (ref 0.1–1.0)
Monocytes Relative: 8 %
Neutro Abs: 2.9 10*3/uL (ref 1.7–7.7)
Neutrophils Relative %: 83 %
PLATELETS: 84 10*3/uL — AB (ref 150–400)
RBC: 2.62 MIL/uL — AB (ref 3.87–5.11)
RDW: 14.5 % (ref 11.5–15.5)
WBC: 3.5 10*3/uL — AB (ref 4.0–10.5)

## 2017-08-14 LAB — I-STAT TROPONIN, ED: Troponin i, poc: 0.02 ng/mL (ref 0.00–0.08)

## 2017-08-14 LAB — PROTIME-INR
INR: 1.02
PROTHROMBIN TIME: 13.4 s (ref 11.4–15.2)

## 2017-08-14 LAB — I-STAT CG4 LACTIC ACID, ED

## 2017-08-14 LAB — BRAIN NATRIURETIC PEPTIDE: B NATRIURETIC PEPTIDE 5: 305.3 pg/mL — AB (ref 0.0–100.0)

## 2017-08-14 MED ORDER — TORSEMIDE 20 MG PO TABS
40.0000 mg | ORAL_TABLET | Freq: Once | ORAL | Status: AC
  Administered 2017-08-14: 40 mg via ORAL
  Filled 2017-08-14: qty 2

## 2017-08-14 MED ORDER — HEPARIN SOD (PORK) LOCK FLUSH 100 UNIT/ML IV SOLN
500.0000 [IU] | Freq: Once | INTRAVENOUS | Status: AC | PRN
  Administered 2017-08-14: 500 [IU]
  Filled 2017-08-14: qty 5

## 2017-08-14 MED ORDER — FOSFOMYCIN TROMETHAMINE 3 G PO PACK
3.0000 g | PACK | Freq: Once | ORAL | Status: AC
  Administered 2017-08-14: 3 g via ORAL
  Filled 2017-08-14: qty 3

## 2017-08-14 NOTE — ED Triage Notes (Signed)
Pt comes from home. Pt is AOx4 and is able to only stand and pivot. Pt presents to ED with SOB having only one round of chemo roughly 25-30 days ago. Pt stated she had temperature of 101.6 oral as well. No other complaints. Daughter [name] is at bedside with Patient.

## 2017-08-14 NOTE — Assessment & Plan Note (Signed)
72 y.o. 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 On review, patient's blood counts appear to have dropped significantly in Nov 2018, but remained  stable since then.  Patient has previously received methotrexate for rheumatoid arthritis treatment, but has not been receiving it for a prolonged period of time.  Patient cannot recall how long exactly.  We have obtain bone marrow biopsy as part of the evaluation with her underlying condition.  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  Nevertheless, patient did have lymphoid aggregates and kappa light chain restricted plasma cell population and presence of a lymphoplasmacytic lymphoma or multiple myeloma cannot be excluded at this time supported by presence of IgM monoclonal gammopathy.  As discussed previously, we believe that the advanced cytopenias are likely due to progressive lymphoproliferative condition, but could be contributed to by the underlying myelodysplasia.  Based on potential for significantly improving the patient's fatigue by treatment of the lymphoproliferative condition, we have advised the patient to proceed with such therapy.  We have reviewed our options and agreed to proceed with combination of rituximab and bendamustine.  Due to poor performance status, possible underlying myelodysplasia and cytopenias, bendamustine will be administered at 50% dose reduction with the first cycle of therapy.  Additionally, patient has severe hypogammaglobulinemia, but due to presence of significant IgM spike and need for administration of rituximab, directed treatment of hypogammaglobulinemia with IVIG will be delayed.  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  Today, we  have reviewed the reasons for recommending therapy, schedule of administration, component medications, and potential side effects of the treatment.  Patient signed informed consent to proceed with recommended therapy.  Clinical evaluation lab work are permissive to proceed.  Plan: - Proceed with cycle #1 of rituximab and bendamustine, 50% bendamustine dose reduction by administering 1 day only - Weekly CBC and possible transfusions for hematological support, will target hemoglobin of 7.0 or higher with 1 unit of packed red blood cells transfused at the time, all products leuko-reduced and irradiated.  Platelets will be transfused for platelet count of 20 or lower, all products leuko-reduced and irradiated. -Return to our clinic in 1 week for toxicity monitoring. -Return to clinic in 4 weeks with labs and clinic visit for possible second cycle of chemoimmunotherapy.  We will consider increasing the dose of bendamustine if patient exhibits good tolerance and hematological improvement at that time.

## 2017-08-14 NOTE — ED Notes (Signed)
ED Provider at bedside. 

## 2017-08-14 NOTE — Discharge Instructions (Addendum)
For the next 3 days please take torsemide 20 mg twice daily.  You may then return to your normal daily dosing of 20 mg.  Follow-up with your physician, or return here for concerning changes in your condition.

## 2017-08-14 NOTE — ED Provider Notes (Addendum)
Emily Kennedy Provider Note   CSN: [id number] Arrival date & time: 08/14/17  0805     History   Chief Complaint Chief Complaint  Patient presents with  . Shortness of Breath  . Chemo Patient    HPI Emily Kennedy is a 72 y.o. female.  HPI Patient presents with her daughter who assists with the HPI. She has multiple medical issues including lymphoma, anemia, has had one infusion 3 weeks ago for lymphoma, and prior transfusions for anemia. Beyond this, no other recent medication changes, therapeutic changes, but the patient now presents with concern of cough, dyspnea and fever. Temperature is 101 at home In addition to cough, fever, dyspnea, patient has fatigue, but no focal pain, no confusion, disorientation, syncope. She has ongoing swelling in both lower extremities, it is unclear if this is changed. Patient did not take medication, but her fever has improved prior to ED arrival.  Past Medical History:  Diagnosis Date  . CHF (congestive heart failure) ([place])   . Diabetes mellitus   . Hypertension   . Thyroid disease   . Vasculitis [facility])     Patient Active Problem List   Diagnosis Date Noted  . 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 MDS (myelodysplastic syndrome) ([place]) 07/14/2017  . Solitary pulmonary nodule 02/15/2016  . CHF (congestive heart failure) ([place]) 02/14/2016  . Malnutrition of moderate degree 09/18/2015  . Benign essential HTN   . Vasculitis ([place])   . Rheumatoid arthritis ([place])   . History of recent hospitalization   . Acute blood loss anemia   . Anemia of chronic disease   . Hyponatremia 09/15/2015  . HCAP (healthcare-associated pneumonia) 09/14/2015  . Respiratory arrest ([place]) intubated 4/13 post arrest 06/27/2015  . Cardiac arrest ([place]) 4/13 suspected mucus plugging as cause of arrest 06/27/2015  . Controlled diabetes  mellitus type 2 with complications ([place])   . Dysphagia   . Atrial fibrillation with RVR ([place]) 06/18/2015  . Acute on chronic diastolic CHF (congestive heart failure), NYHA class 3 ([place])   . Acute encephalopathy 06/14/2015  . Anemia, chronic disease 05/18/2015  . Hypothyroidism 05/18/2015  . Essential hypertension 05/18/2015  . Diabetes mellitus type 2, uncontrolled ([place]) 04/09/2015  . 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 IR FLUORO GUIDE PORT INSERTION RIGHT  07/26/2017  . IR US GUIDE VASC ACCESS RIGHT  07/26/2017  . MELANOMA EXCISION    . TUBAL LIGATION       OB History   None      Home Medications    Prior to Admission medications   Medication Sig Start Date End Date Taking? Authorizing Provider  amLODipine (NORVASC) 2.5 MG tablet Take 2.5 mg by mouth daily.   Yes [provider]  Calcium-Magnesium-Vitamin D (CALCIUM 500 PO) Take 500 mg by mouth every morning.   Yes [provider]  carvedilol (COREG) 25 MG tablet Take 1 tablet (25 mg total) by mouth 2 (two) times daily. 09/19/15  Yes Theodis Blaze, MD  cholecalciferol (VITAMIN D) 400 units TABS tablet Take 400 Units by mouth daily.   Yes [provider]  dexamethasone (DECADRON) 4 MG tablet Take 2 tablets (8 mg total) by mouth daily. Start the day after bendamustine chemotherapy for 2 days. Take with food. 07/24/17  Yes Ardath Sax, MD  diltiazem (CARDIZEM) 90 MG tablet Take 90  mg by mouth 3 (three) times daily. Midnight, 6am, noon, 6pm    Yes [provider]  ferrous sulfate 325 (65 FE) MG tablet Take 325 mg by mouth 2 (two) times daily with a meal.   Yes [provider]  GLUCERNA (GLUCERNA) LIQD Take 237 mLs by mouth daily.    Yes [provider]  glucosamine-chondroitin 500-400 MG tablet Take 2 tablets by mouth daily.    Yes [provider]  hydrALAZINE (APRESOLINE) 100 MG  tablet Take 100 mg by mouth 3 (three) times daily.   Yes [provider]  levothyroxine (SYNTHROID, LEVOTHROID) 112 MCG tablet Take 224 mcg by mouth daily before breakfast.    Yes [provider]  lidocaine-prilocaine (EMLA) cream Apply to affected area once 07/24/17  Yes Perlov, Marinell Blight, MD  lisinopril (PRINIVIL,ZESTRIL) 40 MG tablet Take 40 mg by mouth daily.    Yes [provider]  Multiple Vitamin (MULTIVITAMIN) tablet Take 1 tablet by mouth daily.   Yes [provider]  Omega-3 Fatty Acids (FISH OIL) 1000 MG CAPS Take 4,000 mg by mouth daily.   Yes [provider]  prochlorperazine (COMPAZINE) 10 MG tablet Take 1 tablet (10 mg total) by mouth every 6 (six) hours as needed (Nausea or vomiting). 07/24/17  Yes Ardath Sax, MD  torsemide (DEMADEX) 10 MG tablet Take 2 tablets (20 mg total) by mouth daily. Patient taking differently: Take 20 mg by mouth daily as needed (edema).  02/16/16  Yes Domenic Polite, MD  vitamin B-12 (CYANOCOBALAMIN) 500 MCG tablet Take 500 mcg by mouth daily.   Yes [provider]  zinc gluconate 50 MG tablet Take 50 mg by mouth daily.   Yes [provider]  acetaminophen (TYLENOL) 325 MG tablet Take 2 tablets (650 mg total) by mouth every 6 (six) hours as needed for fever. Patient not taking: Reported on 08/14/2017 09/19/15   Theodis Blaze, MD  acyclovir (ZOVIRAX) 400 MG tablet Take 1 tablet (400 mg total) by mouth daily. Patient not taking: Reported on 08/14/2017 07/24/17   Ardath Sax, MD  allopurinol (ZYLOPRIM) 300 MG tablet Take 1 tablet (300 mg total) by mouth daily. Patient not taking: Reported on 08/14/2017 07/24/17   Ardath Sax, MD  DULoxetine (CYMBALTA) 30 MG capsule Take 30 mg by mouth daily.    [provider]  HYDROcodone-acetaminophen (NORCO/VICODIN) 5-325 MG tablet Take 1-2 tablets by mouth every 6 (six) hours as needed. Patient not taking: Reported on 08/14/2017 09/03/16   Ward,  Delice Bison, DO  LORazepam (ATIVAN) 0.5 MG tablet Take 1 tablet (0.5 mg total) by mouth every 6 (six) hours as needed (Nausea or vomiting). 07/24/17   Ardath Sax, MD  ondansetron (ZOFRAN) 8 MG tablet Take 1 tablet (8 mg total) by mouth 2 (two) times daily as needed for refractory nausea / vomiting. Start on day 2 after bendamustine chemotherapy. 07/24/17   Ardath Sax, MD    Family History Family History  Problem Relation Age of Onset  . Cancer Sister   . Diabetes Mellitus II Mother   . Tuberculosis Father     Social History Social History   Tobacco Use  . Smoking status: Never Smoker  . Smokeless tobacco: Never Used  Substance Use Topics  . Alcohol use: No  . Drug use: No     Allergies   Benadryl [diphenhydramine]; Ciprofloxacin; Furosemide; Nitrofurantoin monohyd macro; Septra [bactrim]; Sulfa drugs cross reactors; Allopurinol; and Ceclor [cefaclor]   Review  of Systems Review of Systems  Constitutional:       Per HPI, otherwise negative  HENT:       Per HPI, otherwise negative  Respiratory:       Per HPI, otherwise negative  Cardiovascular:       Per HPI, otherwise negative  Gastrointestinal: Negative for vomiting.  Endocrine:       Negative aside from HPI  Genitourinary:       Neg aside from HPI   Musculoskeletal:       Per HPI, otherwise negative  Skin: Positive for pallor.  Allergic/Immunologic: Positive for immunocompromised state.  Neurological: Positive for weakness. Negative for syncope.     Physical Exam Updated Vital Signs BP (!) 155/68   Pulse 71   Temp (S) 98.6 F (37 C) (Oral)   Resp 17   Ht 5\' 4"  (1.626 m)   Wt 67.1 kg (148 lb)   SpO2 94%   BMI 25.40 kg/m   Physical Exam  Constitutional: She is oriented to person, place, and time. She has a sickly appearance. No distress.  HENT:  Head: Normocephalic and atraumatic.  Eyes: Conjunctivae and EOM are normal.  Cardiovascular: Normal rate and regular rhythm.  Pulmonary/Chest:  Effort normal. No respiratory distress. She has decreased breath sounds.  Abdominal: She exhibits no distension.  Musculoskeletal: She exhibits no edema.  Neurological: She is alert and oriented to person, place, and time. No cranial nerve deficit.  Skin: Skin is warm and dry.  Psychiatric: She has a normal mood and affect.  Nursing note and vitals reviewed.    ED Treatments / Results  Labs (all labs ordered are listed, but only abnormal results are displayed) Labs Reviewed  COMPREHENSIVE METABOLIC PANEL - Abnormal; Notable for the following components:      Result Value   Sodium 132 (*)    CO2 20 (*)    Glucose, Bld 152 (*)    BUN 41 (*)    Creatinine, Ser 1.40 (*)    Calcium 7.9 (*)    Total Protein 4.7 (*)    Albumin 2.0 (*)    AST 14 (*)    GFR calc non Af Amer 37 (*)    GFR calc Af Amer 43 (*)    Anion gap 4 (*)    All other components within normal limits  CBC WITH DIFFERENTIAL/PLATELET - Abnormal; Notable for the following components:   WBC 3.5 (*)    RBC 2.62 (*)    Hemoglobin 7.7 (*)    HCT 23.1 (*)    Platelets 84 (*)    Lymphs Abs 0.2 (*)    All other components within normal limits  BRAIN NATRIURETIC PEPTIDE - Abnormal; Notable for the following components:   B Natriuretic Peptide 305.3 (*)    All other components within normal limits  I-STAT CG4 LACTIC ACID, ED - Abnormal; Notable for the following components:   Lactic Acid, Venous <0.30 (*)    All other components within normal limits  PROTIME-INR  URINALYSIS, ROUTINE W REFLEX MICROSCOPIC  I-STAT TROPONIN, ED    EKG Sinus rhythm, rate 69, nonspecific intraventricular conduction delay abnormal EKG Radiology Dg Chest 2 View  Result Date: 08/14/2017 CLINICAL DATA:  Shortness of breath status post 1 round of chemotherapy approximately 25-30 days ago. Fever. EXAM: CHEST - 2 VIEW COMPARISON:  Chest x-ray dated 02/02/2017. Chest CT dated 02/14/2016. FINDINGS: Stable cardiomegaly. Stable small LEFT pleural  effusion. No new lung findings. RIGHT chest wall Port-A-Cath in place  with tip adequately positioned at the level of the mid SVC. IMPRESSION: No acute findings. Stable small LEFT pleural effusion. No evidence of pneumonia or pulmonary edema. Electronically Signed   By: Franki Cabot M.D.   On: 08/14/2017 09:24    Procedures Procedures (including critical care time)  Medications Ordered in ED Medications  torsemide (DEMADEX) tablet 40 mg (has no administration in time range)     Initial Impression / Assessment and Plan / ED Course  I have reviewed the triage vital signs and the nursing notes.  Pertinent labs & imaging results that were available during my care of the patient were reviewed by me and considered in my medical decision making (see chart for details).     11:16 AM On repeat exam patient is awake, alert, he dynamically unremarkable, afebrile, with no respiratory distress. Findings discussed with patient and his daughter. Findings generally reassuring aside from elevated BNP. No x-ray evidence for pneumonia. Patient worsening heart failure likely clinician for her dyspnea, fatigue Absent fever, evidence for pneumonia, or evidence for coronary ischemia, with unremarkable EKG, nml trop, the patient was started on increased dose of diuretic, discharged in stable condition with close outpatient follow-up.   12:00 PM UA abnormal.  She has no urinary complaints but w fever she will receive Fosfomycin.  Culture sent Final Clinical Impressions(s) / ED Diagnoses  Heart failure exacerbation   Carmin Muskrat, MD 08/14/17 1118    Carmin Muskrat, MD 08/14/17 1200    Carmin Muskrat, MD 08/14/17 1201

## 2017-08-14 NOTE — Progress Notes (Signed)
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 On review, patient's blood counts appear to have dropped significantly in Nov 2018, but remained  stable since then.  Patient has previously received methotrexate for rheumatoid arthritis treatment, but has not been receiving it for a prolonged period of time.  Patient cannot recall how long exactly.  We have obtain bone marrow biopsy as part of the evaluation with her underlying condition.  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  Nevertheless, patient did have lymphoid aggregates and kappa light chain restricted plasma cell population and presence of a lymphoplasmacytic lymphoma or multiple myeloma cannot be excluded at this time supported by presence of IgM monoclonal gammopathy.  As discussed previously, we believe that the advanced cytopenias are likely due to progressive lymphoproliferative condition, but could be contributed to by the underlying myelodysplasia.  Based on potential for significantly improving the patient's fatigue by treatment of the lymphoproliferative condition, we have advised the patient to proceed with such therapy.  We have reviewed our options and agreed to proceed with combination of rituximab and bendamustine.  Due to poor performance status, possible underlying myelodysplasia and cytopenias, bendamustine will be administered at 50% dose reduction with the first cycle of therapy.  Additionally, patient has severe hypogammaglobulinemia, but due to presence of significant IgM spike and need for administration of rituximab, directed treatment of hypogammaglobulinemia with IVIG will be delayed.   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  Today, we have reviewed the reasons for recommending therapy, schedule of administration, component medications, and potential side effects of the treatment.  Patient signed informed consent to proceed with recommended therapy.  Clinical evaluation lab work are permissive to proceed.  Plan: - Proceed with cycle #1 of rituximab and bendamustine, 50% bendamustine dose reduction by administering 1 day only - Weekly CBC and possible transfusions for hematological support, will target hemoglobin of 7.0 or higher with 1 unit of packed red blood cells transfused at the time, all products leuko-reduced and irradiated.  Platelets will be transfused for platelet count of 20 or lower, all products leuko-reduced and irradiated. -Return to our clinic in 1 week for toxicity monitoring. -Return to clinic in 4 weeks with labs and clinic visit for possible second cycle of chemoimmunotherapy.  We will consider increasing the dose of bendamustine if patient exhibits good tolerance and hematological improvement at that time.   Voice recognition software was used and creation of this note. Despite my best effort at editing the text, some misspelling/errors may have occurred.  Orders Placed This Encounter  Procedures  . CMP ([name] only)    Standing Status:   Future    Number of Occurrences:   1    Standing Expiration Date:   08/03/2018  . Magnesium    Standing Status:   Future    Number of Occurrences:   1    Standing Expiration Date:   08/02/2018  . Phosphorus    Standing Status:   Future    Number of Occurrences:   1    Standing Expiration Date:   08/02/2018  . Uric acid    Standing Status:   Future    Number of Occurrences:   1    Standing  Expiration Date:   08/02/2018  . CBC with Differential (Cancer Center Only)    Standing Status:   Standing    Number of Occurrences:   6    Standing  Expiration Date:   08/03/2018  . CBC with Differential (Cancer Center Only)    Standing Status:   Future    Standing Expiration Date:   08/03/2018  . CMP ([place] only)    Standing Status:   Future    Standing Expiration Date:   08/03/2018  . Lactate dehydrogenase (LDH)    Standing Status:   Future    Standing Expiration Date:   08/02/2018  . Beta 2 microglobulin, serum    Standing Status:   Future    Standing Expiration Date:   08/03/2018  . Uric acid    Standing Status:   Future    Standing Expiration Date:   08/02/2018  . Magnesium    Standing Status:   Future    Standing Expiration Date:   08/02/2018  . Phosphorus    Standing Status:   Future    Standing Expiration Date:   08/02/2018  . QIG  (Quant. immunoglobulins  - IgG, IgA, IgM)    Standing Status:   Future    Standing Expiration Date:   08/02/2018    Cancer Staging No matching staging information was found for the patient.  All questions were answered.  . The patient knows to call the clinic with any problems, questions or concerns.  This note was electronically signed.    History of Presenting Illness Emily Kennedy is a 72 y.o. 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  Patient's past medical history is significant for rheumatoid arthritis, leukocytoclastic vasculitis, iron deficiency anemia, IgM monoclonal gammopathy, splenomegaly, hypothyroidism, hyperlipidemia, and diabetes mellitus.  Patient is currently receiving prednisone, ferrous sulfate twice daily and vitamin B12 supplementation.  She has no history of tobacco or alcohol abuse.  Patient returns to the clinic to discuss completion of the staging for her underlying lymphoproliferative disorder as well as to initiate systemic chemoimmunotherapy with rituximab and bendamustine.  Patient denies any new symptoms since the last visit to the clinic.  Patient reports symptoms of fatigue,  but denies any fevers, chills, night sweats.  Denies any significant weight loss, but appetite has been poor for an extended period of time.  Stable pain in joints associated with her arthritis diagnosis, stable swelling in bilateral lower extremities.  No recent rash or skin ulcerations.  No oral sores or swollen lymph nodes.  No easy bruisability.  Oncological/hematological History: --Labs, 01/17/17: tProt 6.0, Alb 2.4, Ca 9.0, Cr 1.1, AP 98;  WBC 2.5, Hgb 8.9, MCV 92.6, MCH 30.0, MCHC 32.4, RDW 13.3,  Plt 127; Ferritin 83.0 --CT A/P, 01/28/17: Dilated portal vein and splenic vein with splenomegaly and mild ascites in the abdomen and pelvis. Findings suggestive of portal venous hypertension possibly related to cirrhosis. Question subtle nodularity of the liver. Moderate pericardial effusion. Small bilateral pleural effusions, left greater than right. --Labs, 05/10/17: tProt 6.1, Alb 2.5, Ca 9.1, Cr 1.2, AP 91;  WBC 2.3, Hgb 8.1, MCV 91.5, MCH 30.0, MCHC 32.8, RDW 14.5,  Plt 122 --Labs, 06/22/17:       WBC 2.4, Hgb 7.4, MCV 86.6,      Plt   89 --BM Bx, 06/22/17:  normocellular bone marrow with dysplasia of the megakaryocytes, large lymphoid aggregates and normal percentage of plasma cells, but  with a plasma cells demonstrating kappa light chain restriction.  Flow cytometry negative for any monoclonal lymphoid process.  Cytogenetic analysis shows normal cytogenetics, 34 XX. FISH --normal result.  Oncological/hematological History:   MDS (myelodysplastic syndrome) ([place])   06/22/2017 Bone Marrow Biopsy    The marrow is overall normocellular. There is dysplasia in the megakaryocytes. There are several large lymphoid aggregates, including a few smaller ones that are paratrabecular. While plasma cells are not increased, there is an atypical distribution and kappa-restriction. There is no monoclonal B-cell or phenotypically aberrant T-cell population by flow cytometry. The patient's history of rheumatoid  arthritis and methotrexate use is noted. The dysplasia seen could be related to methotrexate use or represent a low grade myelodysplastic syndrome and correlation with cytogenetics is recommended. Similarly, the lymphoid aggregates and atypical plasma cell population could be related to the patient's autoimmune disease, but given their atypical location/distribution a lymphoproliferative process or plasma cell neoplasm is not entirely excluded.  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 There is dysplasia in the megakaryocytes. There are several large lymphoid aggregates, including a few smaller ones that are paratrabecular. While plasma cells are not increased, there is an atypical distribution and kappa-restriction. There is no monoclonal B-cell or phenotypically aberrant T-cell population by flow cytometry. The patient's history of rheumatoid arthritis and methotrexate use is noted. The dysplasia seen could be related to methotrexate use or represent a low grade myelodysplastic syndrome and correlation with cytogenetics is recommended. Similarly, the lymphoid aggregates and atypical plasma cell population could be related to the patient's autoimmune disease, but given their atypical location/distribution a lymphoproliferative process or plasma cell neoplasm is not entirely excluded. Clinical correlation is recommended.      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        Medical History: Past Medical History:  Diagnosis Date  . CHF (congestive heart failure) ([place])   . Diabetes mellitus   . Hypertension   . Thyroid disease   . Vasculitis [facility])     Surgical History: Past Surgical History:  Procedure Laterality Date  . IR FLUORO GUIDE PORT INSERTION RIGHT  07/26/2017  . IR US GUIDE VASC ACCESS RIGHT  07/26/2017  . MELANOMA EXCISION    . TUBAL LIGATION      Family History: Family History  Problem Relation Age of Onset  . Cancer Sister   . Diabetes Mellitus II Mother   . Tuberculosis Father     Social History: Social History   Socioeconomic History  . Marital status: Divorced  Spouse name: Not on file  . Number of children: Not on file  . Years of education: Not on file  . Highest education level: Not on file  Occupational History  . Occupation: Retired  Scientific laboratory technician  . Financial resource strain: Not on file  . Food insecurity:    Worry: Not on file    Inability: Not on file  . Transportation needs:    Medical: Not on file    Non-medical: Not on file  Tobacco Use  . Smoking status: Never Smoker  . Smokeless tobacco: Never Used  Substance and Sexual Activity  . Alcohol use: No  . Drug use: No  . Sexual activity: Not Currently  Lifestyle  . Physical activity:    Days per week: Not on file    Minutes per session: Not on file  . Stress: Not on file  Relationships  . Social connections:    Talks on phone: Not on file    Gets together: Not on file    Attends religious service: Not on file    Active member of club or organization: Not on file    Attends meetings of  clubs or organizations: Not on file    Relationship status: Not on file  . Intimate partner violence:    Fear of current or ex partner: Not on file    Emotionally abused: Not on file    Physically abused: Not on file    Forced sexual activity: Not on file  Other Topics Concern  . Not on file  Social History Narrative  . Not on file    Allergies: Allergies  Allergen Reactions  . Benadryl [Diphenhydramine] Swelling  . Ciprofloxacin Hives  . Furosemide Itching, Swelling, Rash and Other (See Comments)    Made edema worse  . Nitrofurantoin Monohyd Macro Hives  . Septra [Bactrim] Hives  . Sulfa Drugs Cross Reactors Hives  . Allopurinol Itching  . Ceclor [Cefaclor] Rash    Breaks out in rash chest down.    Medications:  No current facility-administered medications for this visit.    Current Outpatient Medications  Medication Sig Dispense Refill  . acetaminophen (TYLENOL) 325 MG tablet Take 2 tablets (650 mg total) by mouth every 6 (six) hours as needed for fever. (Patient not taking: Reported on 08/14/2017)    . acyclovir (ZOVIRAX) 400 MG tablet Take 1 tablet (400 mg total) by mouth daily. (Patient not taking: Reported on 08/14/2017) 30 tablet 3  . allopurinol (ZYLOPRIM) 300 MG tablet Take 1 tablet (300 mg total) by mouth daily. (Patient not taking: Reported on 08/14/2017) 30 tablet 3  . amLODipine (NORVASC) 2.5 MG tablet Take 2.5 mg by mouth daily.    . Calcium-Magnesium-Vitamin D (CALCIUM 500 PO) Take 500 mg by mouth every morning.    . carvedilol (COREG) 25 MG tablet Take 1 tablet (25 mg total) by mouth 2 (two) times daily.    . cholecalciferol (VITAMIN D) 400 units TABS tablet Take 400 Units by mouth daily.    Marland Kitchen dexamethasone (DECADRON) 4 MG tablet Take 2 tablets (8 mg total) by mouth daily. Start the day after bendamustine chemotherapy for 2 days. Take with food. 30 tablet 1  . diltiazem (CARDIZEM) 90 MG tablet Take 90 mg by mouth 3 (three) times daily. Midnight, 6am, noon, 6pm      . DULoxetine (CYMBALTA) 30 MG capsule Take 30 mg by mouth daily.    . ferrous sulfate 325 (65 FE) MG tablet Take 325 mg by mouth  2 (two) times daily with a meal.    . GLUCERNA (GLUCERNA) LIQD Take 237 mLs by mouth daily.     Marland Kitchen glucosamine-chondroitin 500-400 MG tablet Take 2 tablets by mouth daily.     . hydrALAZINE (APRESOLINE) 100 MG tablet Take 100 mg by mouth 3 (three) times daily.    Marland Kitchen HYDROcodone-acetaminophen (NORCO/VICODIN) 5-325 MG tablet Take 1-2 tablets by mouth every 6 (six) hours as needed. (Patient not taking: Reported on 08/14/2017) 20 tablet 0  . levothyroxine (SYNTHROID, LEVOTHROID) 112 MCG tablet Take 224 mcg by mouth daily before breakfast.     . lidocaine-prilocaine (EMLA) cream Apply to affected area once 30 g 3  . lisinopril (PRINIVIL,ZESTRIL) 40 MG tablet Take 40 mg by mouth daily.     Marland Kitchen LORazepam (ATIVAN) 0.5 MG tablet Take 1 tablet (0.5 mg total) by mouth every 6 (six) hours as needed (Nausea or vomiting). 30 tablet 0  . Multiple Vitamin (MULTIVITAMIN) tablet Take 1 tablet by mouth daily.    . Omega-3 Fatty Acids (FISH OIL) 1000 MG CAPS Take 4,000 mg by mouth daily.    . ondansetron (ZOFRAN) 8 MG tablet Take 1 tablet (8 mg total) by mouth 2 (two) times daily as needed for refractory nausea / vomiting. Start on day 2 after bendamustine chemotherapy. 30 tablet 1  . prochlorperazine (COMPAZINE) 10 MG tablet Take 1 tablet (10 mg total) by mouth every 6 (six) hours as needed (Nausea or vomiting). 30 tablet 1  . torsemide (DEMADEX) 10 MG tablet Take 2 tablets (20 mg total) by mouth daily. (Patient taking differently: Take 20 mg by mouth daily as needed (edema). ) 30 tablet 0  . vitamin B-12 (CYANOCOBALAMIN) 500 MCG tablet Take 500 mcg by mouth daily.    Marland Kitchen zinc gluconate 50 MG tablet Take 50 mg by mouth daily.     Facility-Administered Medications Ordered in Other Visits  Medication Dose Route Frequency Provider Last Rate Last Dose  . heparin lock flush 100 unit/mL  500 Units  Intracatheter Once PRN Carmin Muskrat, MD        Review of Systems: Review of Systems  Constitutional: Positive for fatigue. Negative for chills, diaphoresis, fever and unexpected weight change.  Cardiovascular: Positive for leg swelling.  All other systems reviewed and are negative.    PHYSICAL EXAMINATION Blood pressure (!) 190/76, pulse 75, temperature 98.6 F (37 C), temperature source Oral, resp. rate 18, height 5' 4"  (1.626 m), weight 157 lb 6.4 oz (71.4 kg), SpO2 98 %.  ECOG PERFORMANCE STATUS: 2 - Symptomatic, <50% confined to bed  Physical Exam  Constitutional: She is oriented to person, place, and time. She appears well-developed and well-nourished. No distress.  HENT:  Head: Normocephalic and atraumatic.  Mouth/Throat: Oropharynx is clear and moist. No oropharyngeal exudate.  Eyes: Pupils are equal, round, and reactive to light. Conjunctivae and EOM are normal. No scleral icterus.  Neck: No thyromegaly present.  Cardiovascular: Normal rate, regular rhythm, normal heart sounds and intact distal pulses. Exam reveals no gallop and no friction rub.  No murmur heard. Pulmonary/Chest: Effort normal and breath sounds normal. No stridor. No respiratory distress. She has no wheezes.  Abdominal: Soft. Bowel sounds are normal. She exhibits no distension and no mass. There is no tenderness. There is no guarding.  Musculoskeletal: She exhibits edema.  2+ bilateral lower extremity edema which is tender to palpation without palpable vascular cords.  Lymphadenopathy:    She has no cervical adenopathy.  Neurological: She is alert and oriented  to person, place, and time. She displays normal reflexes. No cranial nerve deficit or sensory deficit.  Skin: Skin is warm and dry. No rash noted. She is not diaphoretic. No erythema. No pallor.     LABORATORY DATA: I have personally reviewed the data as listed: Appointment on 08/02/2017  Component Date Value Ref Range Status  . HCV Ab  08/02/2017 <0.1  0.0 - 0.9 s/co ratio Final   Comment: (NOTE)                                  Negative:     < 0.8                             Indeterminate: 0.8 - 0.9                                  Positive:     > 0.9 The CDC recommends that a positive HCV antibody result be followed up with a HCV Nucleic Acid Amplification test (734193). Performed At: [facility] [place], [place] [id number] Rush Farmer MD [id number] Performed at [facility], [place] [address] [place], [place] [address]   . Hep B Core Total Ab 08/02/2017 Positive* Negative Final   Comment: (NOTE) Performed At: [facility] [place], [place] [id number] Rush Farmer MD [id number] Performed at [facility], [place] [address], [place], [place] [address]   . Hep B S Ab 08/02/2017 Non Reactive   Final   Comment: (NOTE)              Non Reactive: Inconsistent with immunity,                            less than 10 mIU/mL              Reactive:     Consistent with immunity,                            greater than 9.9 mIU/mL **Verified by repeat analysis** Performed At: [facility] [place], [place] [id number] Rush Farmer MD [id number] Performed at [facility], [place] [address], [place], [place] [address]   . Hepatitis B Surface Ag 08/02/2017 Negative  Negative Final   Comment: (NOTE) Performed At: [facility] [place], [place] [id number] Rush Farmer MD [id number] Performed at [facility], [place] [address] [place],  [address]   . IgG (Immunoglobin G), Serum 08/02/2017 273* 700 - 1,600 mg/dL Final   Comment: (NOTE) Result confirmed on concentration. Performed At: [facility] [place], [place] [id number] Rush Farmer MD [id number] Performed at [facility], [place] [address], [place],  [address]   . Beta-2 Microglobulin 08/02/2017 5.5* 0.6 - 2.4 mg/L Final   Comment: (NOTE) Siemens Immulite 2000 Immunochemiluminometric assay (ICMA) Values obtained with different assay methods or kits cannot be used interchangeably. Results cannot be interpreted as absolute evidence of the presence or absence of malignant disease. Performed At: [facility] [place], [place] [id number] Rush Farmer MD [id number] Performed at [facility], [place] [address] [place], [place] [address]   . LDH 08/02/2017 183  125 - 245 U/L Final   Performed at [facility], [place] [address] [place], [place] [address]  . Magnesium 08/02/2017 2.3  1.7 - 2.4 mg/dL Final   Performed at [facility], [place] [address] [place], [place] [address]  . Uric Acid, Serum 08/02/2017 4.1  2.6 - 7.4 mg/dL Final   Performed at [facility], [place] [address] [place], [place] [address]  . Sodium 08/02/2017 137  136 - 145 mmol/L Final  . Potassium 08/02/2017 4.7  3.5 - 5.1 mmol/L Final  . Chloride 08/02/2017 113* 98 - 109 mmol/L Final  . CO2 08/02/2017 20* 22 - 29 mmol/L Final  . Glucose, Bld 08/02/2017 118  70 - 140 mg/dL Final  . BUN 08/02/2017 32* 7 - 26 mg/dL Final  . Creatinine 08/02/2017 1.32* 0.60 - 1.10 mg/dL Final  . Calcium 08/02/2017 8.6  8.4 - 10.4 mg/dL Final  . Total Protein 08/02/2017 5.8* 6.4 - 8.3 g/dL Final  . Albumin 08/02/2017 2.3* 3.5 - 5.0 g/dL Final  . AST 08/02/2017 20  5 - 34 U/L Final  . ALT 08/02/2017 18  0 - 55 U/L Final  . Alkaline Phosphatase 08/02/2017 57  40 - 150 U/L Final  . Total Bilirubin 08/02/2017 0.2  0.2 - 1.2 mg/dL Final  . GFR, Est Non Af Am 08/02/2017 40* >60 mL/min Final  . GFR, Est AFR Am 08/02/2017 46* >60 mL/min Final   Comment: (NOTE) The eGFR has been calculated using the CKD EPI equation. This  calculation has not been validated in all clinical situations. eGFR's persistently <60 mL/min signify possible Chronic Kidney Disease.   Georgiann Hahn gap 08/02/2017 4  3 - 11 Final   Performed at [facility], [place] [address] [place], [place] [address]  . WBC Count 08/02/2017 2.3* 3.9 - 10.3 K/uL Final  . RBC 08/02/2017 2.62* 3.70 - 5.45 MIL/uL Final  . Hemoglobin 08/02/2017 7.7* 11.6 - 15.9 g/dL Final  . HCT 08/02/2017 23.1* 34.8 - 46.6 % Final  . MCV 08/02/2017 88.1  79.5 - 101.0 fL Final  . MCH 08/02/2017 29.4  25.1 - 34.0 pg Final  . MCHC 08/02/2017 33.4  31.5 - 36.0 g/dL Final  . RDW 08/02/2017 14.7* 11.2 - 14.5 % Final  . Platelet Count 08/02/2017 92* 145 - 400 K/uL Final  . Neutrophils Relative % 08/02/2017 82  % Final  . Neutro Abs 08/02/2017 1.9  1.5 - 6.5 K/uL Final  . Lymphocytes Relative 08/02/2017 7  % Final  . Lymphs Abs 08/02/2017 0.2* 0.9 - 3.3 K/uL Final  . Monocytes Relative 08/02/2017 7  % Final  . Monocytes Absolute 08/02/2017 0.2  0.1 - 0.9 K/uL Final  . Eosinophils Relative 08/02/2017 3  % Final  . Eosinophils Absolute 08/02/2017 0.1  0.0 - 0.5 K/uL Final  . Basophils Relative 08/02/2017 1  % Final  . Basophils Absolute 08/02/2017 0.0  0.0 - 0.1 K/uL Final   Performed at [facility], [place] [address], [place], [place] [address]       Ardath Sax, MD

## 2017-08-15 ENCOUNTER — Encounter: Payer: Self-pay | Admitting: Hematology and Oncology

## 2017-08-15 LAB — URINE CULTURE

## 2017-08-15 NOTE — Progress Notes (Signed)
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 On review, patient's blood counts appear to have dropped significantly in Nov 2018, but remained  stable since then.  Patient has previously received methotrexate for rheumatoid arthritis treatment, but has not been receiving it for a prolonged period of time.  Patient cannot recall how long exactly.  We have obtain bone marrow biopsy as part of the evaluation with her underlying condition.  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  Nevertheless, patient did have lymphoid aggregates and kappa light chain restricted plasma cell population and presence of a lymphoplasmacytic lymphoma or multiple myeloma cannot be excluded at this time supported by presence of IgM monoclonal gammopathy.  As discussed previously, we believe that the advanced cytopenias are likely due to progressive lymphoproliferative condition, but could be contributed to by the underlying myelodysplasia.  Based on potential for significantly improving the patient's fatigue by treatment of the lymphoproliferative condition, we have advised the patient to proceed with such therapy.  We have reviewed our options and agreed to proceed with combination of rituximab and bendamustine.  Due to poor performance status, possible underlying myelodysplasia and cytopenias, bendamustine will be administered at 50% dose reduction with the first cycle of therapy.  Additionally, patient has severe hypogammaglobulinemia, but due to presence of significant IgM spike and need for administration of rituximab, directed treatment of hypogammaglobulinemia with IVIG will be delayed.   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  Patient was initiated on systemic chemoimmunotherapy with rituximab and bendamustine.  Tolerated the first week of treatment without significant difficulties.  Clinical evaluation lab work today have been reviewed and patient does not need transfusion support so far.  Plan: - Continue weekly CBC and possible transfusions for hematological support, will target hemoglobin of 7.0 or higher with 1 unit of packed red blood cells transfused at the time, all products leuko-reduced and irradiated.  Platelets will be transfused for platelet count of 20 or lower, all products leuko-reduced and irradiated. -Return to clinic in 3 weeks with labs and clinic visit for possible second cycle of chemoimmunotherapy.  We will consider increasing the dose of bendamustine if patient exhibits good tolerance and hematological improvement at that time.   Voice recognition software was used and creation of this note. Despite my best effort at editing the text, some misspelling/errors may have occurred.  Orders Placed This Encounter  Procedures  . CBC with Differential (Cancer Center Only)    Standing Status:   Future    Standing Expiration Date:   08/11/2018  . Sample to Blood Bank    Standing Status:   Future    Standing Expiration Date:   08/11/2018    Cancer Staging No matching staging information was found for the patient.  All questions were answered.  . The patient knows to call the clinic with any problems, questions or concerns.  This note was electronically signed.    History of Presenting Illness Emily Kennedy is a 72 y.o. 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  Patient's past medical history is significant for rheumatoid arthritis, leukocytoclastic vasculitis, iron deficiency anemia, IgM monoclonal gammopathy,  splenomegaly, hypothyroidism, hyperlipidemia, and diabetes mellitus.  Patient is currently receiving prednisone, ferrous sulfate twice daily and vitamin B12 supplementation.  She has no history of tobacco or alcohol abuse.  Patient returns to the clinic for toxicity monitoring aAfter initiating systemic chemoimmunotherapy with rituximab and bendamustine last week. Tolerating infusion without complication. In the interim, notices improving appetite and energy, but remains tired and weak. Denies any throat fevers, chills, night sweats.  Oncological/hematological History: --Labs, 01/17/17: tProt 6.0, Alb 2.4, Ca 9.0, Cr 1.1, AP 98;  WBC 2.5, Hgb 8.9, MCV 92.6, MCH 30.0, MCHC 32.4, RDW 13.3,  Plt 127; Ferritin 83.0 --CT A/P, 01/28/17: Dilated portal vein and splenic vein with splenomegaly and mild ascites in the abdomen and pelvis. Findings suggestive of portal venous hypertension possibly related to cirrhosis. Question subtle nodularity of the liver. Moderate pericardial effusion. Small bilateral pleural effusions, left greater than right. --Labs, 05/10/17: tProt 6.1, Alb 2.5, Ca 9.1, Cr 1.2, AP 91;  WBC 2.3, Hgb 8.1, MCV 91.5, MCH 30.0, MCHC 32.8, RDW 14.5,  Plt 122 --Labs, 06/22/17:       WBC 2.4, Hgb 7.4, MCV 86.6,      Plt   89 --BM Bx, 06/22/17:  normocellular bone marrow with dysplasia of the megakaryocytes, large lymphoid aggregates and normal percentage of plasma cells, but with a plasma cells demonstrating kappa light chain restriction.  Flow cytometry negative for any monoclonal lymphoid process.  Cytogenetic analysis shows normal cytogenetics, 58 XX. FISH --normal result.  Oncological/hematological History:   MDS (myelodysplastic syndrome) ([place])   06/22/2017 Bone Marrow Biopsy    The marrow is overall normocellular. There is dysplasia in the megakaryocytes. There are several large lymphoid aggregates, including a few smaller ones that are paratrabecular. While plasma cells are not increased,  there is an atypical distribution and kappa-restriction. There is no monoclonal B-cell or phenotypically aberrant T-cell population by flow cytometry. The patient's history of rheumatoid arthritis and methotrexate use is noted. The dysplasia seen could be related to methotrexate use or represent a low grade myelodysplastic syndrome and correlation with cytogenetics is recommended. Similarly, the lymphoid aggregates and atypical plasma cell population could be related to the patient's autoimmune disease, but given their atypical location/distribution a lymphoproliferative process or plasma cell neoplasm is not entirely excluded.  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 There is dysplasia in the megakaryocytes. There are several large lymphoid aggregates, including a few smaller ones that are paratrabecular. While plasma cells are not increased, there is an atypical distribution and kappa-restriction. There is no monoclonal B-cell or phenotypically aberrant T-cell population by flow cytometry. The patient's history of rheumatoid arthritis and methotrexate use is noted. The dysplasia seen could be related to methotrexate use or represent a low grade myelodysplastic syndrome and correlation with cytogenetics is recommended. Similarly, the lymphoid aggregates and atypical plasma cell population could be related to the patient's autoimmune disease, but given their atypical location/distribution a lymphoproliferative process or plasma cell neoplasm is not entirely excluded. Clinical correlation is recommended.      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  No bulky lymphadenopathy and no hypermetabolic lesions in the skeletal structures.      08/02/2017 -  Chemotherapy    riTUXimab (RITUXAN) 375 mg/m2, d1 + bendamustine (BENDEKA) 90 mg/m2, d1,2 Q28d --Cycle #1, 08/02/17: Administered with bendamustine dose reduction by 50% due to current cytopenias and underlying MDS as well as the low PS.       Medical History: Past Medical History:  Diagnosis Date  . CHF (congestive heart failure) ([place])   . Diabetes mellitus   . Hypertension   . Thyroid disease   . Vasculitis [facility])     Surgical History: Past Surgical History:  Procedure Laterality Date  . IR FLUORO GUIDE PORT INSERTION RIGHT  07/26/2017  . IR US GUIDE VASC ACCESS RIGHT  07/26/2017  . MELANOMA EXCISION    . TUBAL LIGATION      Family History: Family History  Problem Relation Age of Onset  . Cancer Sister   . Diabetes Mellitus II Mother   . Tuberculosis Father     Social History: Social History   Socioeconomic History  . Marital status: Divorced    Spouse name: Not on file  . Number of children: Not on file  . Years of education: Not on file  . Highest education level: Not on file  Occupational History  . Occupation: Retired  Scientific laboratory technician  . Financial resource strain: Not on file  . Food insecurity:    Worry: Not on file    Inability: Not on file  . Transportation needs:    Medical: Not on file    Non-medical: Not on file  Tobacco Use  . Smoking status: Never Smoker  . Smokeless tobacco: Never Used  Substance and Sexual Activity  . Alcohol use: No  . Drug use: No  . Sexual activity: Not Currently  Lifestyle  . Physical activity:    Days per week: Not on file    Minutes per session: Not on file  . Stress: Not on file  Relationships  . Social connections:    Talks on phone: Not on file    Gets together: Not on file    Attends  religious service: Not on file    Active member of club or organization: Not on file    Attends meetings of clubs or organizations: Not on file    Relationship status: Not on file  . Intimate partner violence:    Fear of current or ex partner: Not on file    Emotionally abused: Not on file    Physically abused: Not on file    Forced sexual activity: Not on file  Other Topics Concern  . Not on file  Social History Narrative  . Not on file    Allergies: Allergies  Allergen Reactions  . Benadryl [Diphenhydramine] Swelling  . Ciprofloxacin Hives  . Furosemide Itching, Swelling, Rash and Other (See Comments)    Made edema worse  . Nitrofurantoin Monohyd Macro Hives  . Septra [Bactrim] Hives  . Sulfa Drugs Cross Reactors Hives  . Allopurinol Itching  . Ceclor [Cefaclor] Rash    Breaks out in rash chest down.    Medications:  Current Outpatient Medications  Medication Sig Dispense Refill  . acetaminophen (TYLENOL) 325 MG tablet Take 2 tablets (650 mg total) by mouth every 6 (six) hours as needed for fever. (Patient not taking: Reported on 08/14/2017)    . acyclovir (ZOVIRAX) 400 MG tablet Take 1 tablet (400 mg  total) by mouth daily. (Patient not taking: Reported on 08/14/2017) 30 tablet 3  . allopurinol (ZYLOPRIM) 300 MG tablet Take 1 tablet (300 mg total) by mouth daily. (Patient not taking: Reported on 08/14/2017) 30 tablet 3  . amLODipine (NORVASC) 2.5 MG tablet Take 2.5 mg by mouth daily.    . Calcium-Magnesium-Vitamin D (CALCIUM 500 PO) Take 500 mg by mouth every morning.    . carvedilol (COREG) 25 MG tablet Take 1 tablet (25 mg total) by mouth 2 (two) times daily.    . cholecalciferol (VITAMIN D) 400 units TABS tablet Take 400 Units by mouth daily.    Marland Kitchen dexamethasone (DECADRON) 4 MG tablet Take 2 tablets (8 mg total) by mouth daily. Start the day after bendamustine chemotherapy for 2 days. Take with food. 30 tablet 1  . diltiazem (CARDIZEM) 90 MG tablet Take 90 mg by mouth 3 (three)  times daily. Midnight, 6am, noon, 6pm     . DULoxetine (CYMBALTA) 30 MG capsule Take 30 mg by mouth daily.    . ferrous sulfate 325 (65 FE) MG tablet Take 325 mg by mouth 2 (two) times daily with a meal.    . GLUCERNA (GLUCERNA) LIQD Take 237 mLs by mouth daily.     Marland Kitchen glucosamine-chondroitin 500-400 MG tablet Take 2 tablets by mouth daily.     . hydrALAZINE (APRESOLINE) 100 MG tablet Take 100 mg by mouth 3 (three) times daily.    Marland Kitchen HYDROcodone-acetaminophen (NORCO/VICODIN) 5-325 MG tablet Take 1-2 tablets by mouth every 6 (six) hours as needed. (Patient not taking: Reported on 08/14/2017) 20 tablet 0  . levothyroxine (SYNTHROID, LEVOTHROID) 112 MCG tablet Take 224 mcg by mouth daily before breakfast.     . lidocaine-prilocaine (EMLA) cream Apply to affected area once 30 g 3  . lisinopril (PRINIVIL,ZESTRIL) 40 MG tablet Take 40 mg by mouth daily.     Marland Kitchen LORazepam (ATIVAN) 0.5 MG tablet Take 1 tablet (0.5 mg total) by mouth every 6 (six) hours as needed (Nausea or vomiting). 30 tablet 0  . Multiple Vitamin (MULTIVITAMIN) tablet Take 1 tablet by mouth daily.    . Omega-3 Fatty Acids (FISH OIL) 1000 MG CAPS Take 4,000 mg by mouth daily.    . ondansetron (ZOFRAN) 8 MG tablet Take 1 tablet (8 mg total) by mouth 2 (two) times daily as needed for refractory nausea / vomiting. Start on day 2 after bendamustine chemotherapy. 30 tablet 1  . prochlorperazine (COMPAZINE) 10 MG tablet Take 1 tablet (10 mg total) by mouth every 6 (six) hours as needed (Nausea or vomiting). 30 tablet 1  . torsemide (DEMADEX) 10 MG tablet Take 2 tablets (20 mg total) by mouth daily. (Patient taking differently: Take 20 mg by mouth daily as needed (edema). ) 30 tablet 0  . vitamin B-12 (CYANOCOBALAMIN) 500 MCG tablet Take 500 mcg by mouth daily.    Marland Kitchen zinc gluconate 50 MG tablet Take 50 mg by mouth daily.     No current facility-administered medications for this visit.     Review of Systems: Review of Systems  Constitutional:  Positive for fatigue. Negative for chills, diaphoresis, fever and unexpected weight change.  Cardiovascular: Positive for leg swelling.  All other systems reviewed and are negative.    PHYSICAL EXAMINATION Blood pressure (!) 188/75, pulse 81, temperature 98.7 F (37.1 C), temperature source Oral, resp. rate 18, height 5' 4"  (1.626 m), weight 167 lb 12.8 oz (76.1 kg), SpO2 98 %.  ECOG PERFORMANCE STATUS: 2 - Symptomatic, <50%  confined to bed  Physical Exam  Constitutional: She is oriented to person, place, and time. She appears well-developed and well-nourished. No distress.  HENT:  Head: Normocephalic and atraumatic.  Mouth/Throat: Oropharynx is clear and moist. No oropharyngeal exudate.  Eyes: Pupils are equal, round, and reactive to light. Conjunctivae and EOM are normal. No scleral icterus.  Neck: No thyromegaly present.  Cardiovascular: Normal rate, regular rhythm, normal heart sounds and intact distal pulses. Exam reveals no gallop and no friction rub.  No murmur heard. Pulmonary/Chest: Effort normal and breath sounds normal. No stridor. No respiratory distress. She has no wheezes.  Abdominal: Soft. Bowel sounds are normal. She exhibits no distension and no mass. There is no tenderness. There is no guarding.  Musculoskeletal: She exhibits edema.  2+ bilateral lower extremity edema which is tender to palpation without palpable vascular cords.  Lymphadenopathy:    She has no cervical adenopathy.  Neurological: She is alert and oriented to person, place, and time. She displays normal reflexes. No cranial nerve deficit or sensory deficit.  Skin: Skin is warm and dry. No rash noted. She is not diaphoretic. No erythema. No pallor.     LABORATORY DATA: I have personally reviewed the data as listed: Appointment on 08/10/2017  Component Date Value Ref Range Status  . WBC Count 08/10/2017 2.8* 3.9 - 10.3 K/uL Final  . RBC 08/10/2017 2.63* 3.70 - 5.45 MIL/uL Final  . Hemoglobin  08/10/2017 7.6* 11.6 - 15.9 g/dL Final  . HCT 08/10/2017 23.2* 34.8 - 46.6 % Final  . MCV 08/10/2017 88.2  79.5 - 101.0 fL Final  . MCH 08/10/2017 28.9  25.1 - 34.0 pg Final  . MCHC 08/10/2017 32.8  31.5 - 36.0 g/dL Final  . RDW 08/10/2017 14.8* 11.2 - 14.5 % Final  . Platelet Count 08/10/2017 85* 145 - 400 K/uL Final  . Neutrophils Relative % 08/10/2017 81  % Final  . Neutro Abs 08/10/2017 2.3  1.5 - 6.5 K/uL Final  . Lymphocytes Relative 08/10/2017 5  % Final  . Lymphs Abs 08/10/2017 0.1* 0.9 - 3.3 K/uL Final  . Monocytes Relative 08/10/2017 9  % Final  . Monocytes Absolute 08/10/2017 0.3  0.1 - 0.9 K/uL Final  . Eosinophils Relative 08/10/2017 4  % Final  . Eosinophils Absolute 08/10/2017 0.1  0.0 - 0.5 K/uL Final  . Basophils Relative 08/10/2017 1  % Final  . Basophils Absolute 08/10/2017 0.0  0.0 - 0.1 K/uL Final   Performed at [facility], [place] [address], [place], [place] [address]  . Uric Acid, Serum 08/10/2017 5.7  2.6 - 7.4 mg/dL Final   Performed at [facility], [place] [address], [place], [place] [address]  . Phosphorus 08/10/2017 4.0  2.5 - 4.6 mg/dL Final   Performed at [place] [address], [place], [place] [address]  . Magnesium 08/10/2017 2.3  1.7 - 2.4 mg/dL Final   Performed at [facility], [place] [address], [place],  [address]  . Sodium 08/10/2017 135* 136 - 145 mmol/L Final  . Potassium 08/10/2017 4.8  3.5 - 5.1 mmol/L Final  . Chloride 08/10/2017 111* 98 - 109 mmol/L Final  . CO2 08/10/2017 18* 22 - 29 mmol/L Final  . Glucose, Bld 08/10/2017 112  70 - 140 mg/dL Final  . BUN 08/10/2017 31* 7 - 26 mg/dL Final  . Creatinine 08/10/2017 1.20* 0.60 - 1.10 mg/dL Final  . Calcium 08/10/2017 8.3* 8.4 -  10.4 mg/dL Final  . Total Protein 08/10/2017 5.1* 6.4 - 8.3 g/dL Final  . Albumin 08/10/2017 2.1* 3.5 - 5.0 g/dL Final  . AST 08/10/2017 20  5 - 34 U/L Final  . ALT  08/10/2017 18  0 - 55 U/L Final  . Alkaline Phosphatase 08/10/2017 47  40 - 150 U/L Final  . Total Bilirubin 08/10/2017 0.2  0.2 - 1.2 mg/dL Final  . GFR, Est Non Af Am 08/10/2017 45* >60 mL/min Final  . GFR, Est AFR Am 08/10/2017 52* >60 mL/min Final   Comment: (NOTE) The eGFR has been calculated using the CKD EPI equation. This calculation has not been validated in all clinical situations. eGFR's persistently <60 mL/min signify possible Chronic Kidney Disease.   Georgiann Hahn gap 08/10/2017 6  3 - 11 Final   Performed at [facility], [place] [address], [place], [place] [address]  . Blood Bank Specimen 08/10/2017 SAMPLE AVAILABLE FOR TESTING   Final  . Sample Expiration 08/10/2017    Final                   Value:08/13/2017 Performed at [facility], [place] [address] [place], [place] [address]        Ardath Sax, MD

## 2017-08-15 NOTE — Assessment & Plan Note (Signed)
72 y.o. 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 On review, patient's blood counts appear to have dropped significantly in Nov 2018, but remained  stable since then.  Patient has previously received methotrexate for rheumatoid arthritis treatment, but has not been receiving it for a prolonged period of time.  Patient cannot recall how long exactly.  We have obtain bone marrow biopsy as part of the evaluation with her underlying condition.  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  Nevertheless, patient did have lymphoid aggregates and kappa light chain restricted plasma cell population and presence of a lymphoplasmacytic lymphoma or multiple myeloma cannot be excluded at this time supported by presence of IgM monoclonal gammopathy.  As discussed previously, we believe that the advanced cytopenias are likely due to progressive lymphoproliferative condition, but could be contributed to by the underlying myelodysplasia.  Based on potential for significantly improving the patient's fatigue by treatment of the lymphoproliferative condition, we have advised the patient to proceed with such therapy.  We have reviewed our options and agreed to proceed with combination of rituximab and bendamustine.  Due to poor performance status, possible underlying myelodysplasia and cytopenias, bendamustine will be administered at 50% dose reduction with the first cycle of therapy.  Additionally, patient has severe hypogammaglobulinemia, but due to presence of significant IgM spike and need for administration of rituximab, directed treatment of hypogammaglobulinemia with IVIG will be delayed.  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  Patient was  initiated on systemic chemoimmunotherapy with rituximab and bendamustine.  Tolerated the first week of treatment without significant difficulties.  Clinical evaluation lab work today have been reviewed and patient does not need transfusion support so far.  Plan: - Continue weekly CBC and possible transfusions for hematological support, will target hemoglobin of 7.0 or higher with 1 unit of packed red blood cells transfused at the time, all products leuko-reduced and irradiated.  Platelets will be transfused for platelet count of 20 or lower, all products leuko-reduced and irradiated. -Return to clinic in 3 weeks with labs and clinic visit for possible second cycle of chemoimmunotherapy.  We will consider increasing the dose of bendamustine if patient exhibits good tolerance and hematological improvement at that time.

## 2017-08-19 ENCOUNTER — Inpatient Hospital Stay: Payer: Medicare Other

## 2017-08-19 ENCOUNTER — Inpatient Hospital Stay: Payer: Medicare Other | Attending: Hematology and Oncology

## 2017-08-19 ENCOUNTER — Other Ambulatory Visit: Payer: Self-pay

## 2017-08-19 DIAGNOSIS — D472 Monoclonal gammopathy: Secondary | ICD-10-CM | POA: Diagnosis not present

## 2017-08-19 DIAGNOSIS — Z5112 Encounter for antineoplastic immunotherapy: Secondary | ICD-10-CM | POA: Diagnosis present

## 2017-08-19 DIAGNOSIS — D469 Myelodysplastic syndrome, unspecified: Secondary | ICD-10-CM

## 2017-08-19 DIAGNOSIS — M069 Rheumatoid arthritis, unspecified: Secondary | ICD-10-CM | POA: Insufficient documentation

## 2017-08-19 DIAGNOSIS — D509 Iron deficiency anemia, unspecified: Secondary | ICD-10-CM | POA: Diagnosis not present

## 2017-08-19 DIAGNOSIS — I890 Lymphedema, not elsewhere classified: Secondary | ICD-10-CM | POA: Insufficient documentation

## 2017-08-19 DIAGNOSIS — C8300 Small cell B-cell lymphoma, unspecified site: Secondary | ICD-10-CM | POA: Diagnosis not present

## 2017-08-19 DIAGNOSIS — D801 Nonfamilial hypogammaglobulinemia: Secondary | ICD-10-CM | POA: Diagnosis not present

## 2017-08-19 DIAGNOSIS — D61818 Other pancytopenia: Secondary | ICD-10-CM | POA: Diagnosis not present

## 2017-08-19 DIAGNOSIS — Z79899 Other long term (current) drug therapy: Secondary | ICD-10-CM | POA: Diagnosis not present

## 2017-08-19 DIAGNOSIS — E119 Type 2 diabetes mellitus without complications: Secondary | ICD-10-CM | POA: Diagnosis not present

## 2017-08-19 DIAGNOSIS — M310 Hypersensitivity angiitis: Secondary | ICD-10-CM | POA: Insufficient documentation

## 2017-08-19 DIAGNOSIS — E785 Hyperlipidemia, unspecified: Secondary | ICD-10-CM | POA: Insufficient documentation

## 2017-08-19 DIAGNOSIS — E43 Unspecified severe protein-calorie malnutrition: Secondary | ICD-10-CM | POA: Diagnosis not present

## 2017-08-19 LAB — CBC WITH DIFFERENTIAL (CANCER CENTER ONLY)
BASOS ABS: 0 10*3/uL (ref 0.0–0.1)
BASOS PCT: 0 %
EOS ABS: 0.1 10*3/uL (ref 0.0–0.5)
EOS PCT: 3 %
HCT: 25.1 % — ABNORMAL LOW (ref 34.8–46.6)
HEMOGLOBIN: 8.3 g/dL — AB (ref 11.6–15.9)
LYMPHS ABS: 0.2 10*3/uL — AB (ref 0.9–3.3)
Lymphocytes Relative: 7 %
MCH: 28.6 pg (ref 25.1–34.0)
MCHC: 33.1 g/dL (ref 31.5–36.0)
MCV: 86.6 fL (ref 79.5–101.0)
Monocytes Absolute: 0.2 10*3/uL (ref 0.1–0.9)
Monocytes Relative: 7 %
NEUTROS PCT: 83 %
Neutro Abs: 2 10*3/uL (ref 1.5–6.5)
PLATELETS: 84 10*3/uL — AB (ref 145–400)
RBC: 2.9 MIL/uL — AB (ref 3.70–5.45)
RDW: 14.5 % (ref 11.2–14.5)
WBC: 2.4 10*3/uL — AB (ref 3.9–10.3)

## 2017-08-19 LAB — ABO/RH: ABO/RH(D): B POS

## 2017-08-19 LAB — PREPARE RBC (CROSSMATCH)

## 2017-08-19 LAB — SAMPLE TO BLOOD BANK

## 2017-08-19 MED ORDER — HEPARIN SOD (PORK) LOCK FLUSH 100 UNIT/ML IV SOLN
500.0000 [IU] | Freq: Once | INTRAVENOUS | Status: AC
  Administered 2017-08-19: 500 [IU] via INTRAVENOUS
  Filled 2017-08-19: qty 5

## 2017-08-19 MED ORDER — SODIUM CHLORIDE 0.9 % IV SOLN
250.0000 mL | Freq: Once | INTRAVENOUS | Status: AC
  Administered 2017-08-19: 250 mL via INTRAVENOUS

## 2017-08-19 MED ORDER — CLONIDINE HCL 0.1 MG PO TABS
0.1000 mg | ORAL_TABLET | Freq: Once | ORAL | Status: AC
  Administered 2017-08-19: 0.1 mg via ORAL

## 2017-08-19 MED ORDER — SODIUM CHLORIDE 0.9% FLUSH
10.0000 mL | INTRAVENOUS | Status: AC | PRN
  Administered 2017-08-19: 10 mL
  Filled 2017-08-19: qty 10

## 2017-08-19 MED ORDER — CLONIDINE HCL 0.1 MG PO TABS
ORAL_TABLET | ORAL | Status: AC
  Filled 2017-08-19: qty 1

## 2017-08-19 NOTE — Patient Instructions (Signed)

## 2017-08-19 NOTE — Progress Notes (Signed)
Multiple attempts at BP post blood transfusion. 200/88. Spoke with on-call MD, Dr. Alvy Bimler. Noted pt has not taken BP medications this morning. Pt denies dizziness or HA. Verbal order received for 0.1mg  clonidine PO and then pt to be released home and encouraged to take medications in the morning prior to coming in for treatments. Pt may still take BP medications at home after discharge today.

## 2017-08-20 LAB — TYPE AND SCREEN
ABO/RH(D): B POS
ANTIBODY SCREEN: NEGATIVE
Unit division: 0

## 2017-08-20 LAB — BPAM RBC
Blood Product Expiration Date: 201906202359
ISSUE DATE / TIME: 201906071231
Unit Type and Rh: 7300

## 2017-08-29 ENCOUNTER — Telehealth: Payer: Self-pay | Admitting: Hematology

## 2017-08-29 NOTE — Progress Notes (Signed)
HEMATOLOGY/ONCOLOGY CLINIC NOTE  Date of Service: 08/30/2017  Patient Care Team: [name], MD as PCP - General (Internal Medicine)  CHIEF COMPLAINTS/PURPOSE OF CONSULTATION:  Myelodysplastic syndrome Lymphoma  Oncologic History:  The pt was first diagnosed with MDS on 07/08/17 after a bone marrow biopsy on 06/22/17 revealed dysplasia in the megakaryocytes. The same biopsy also revealed lymphoid aggregates, and an atypical distribution and kappa-restriction of plasma cells, without monoclonal B or T cell populations. The pt's 07/08/17 labs showed IgM elevation at 1926, IgG at 131, and IgA<5 with an M spike of 1.1 g/dL from SPEP. 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 She began Bendamustine 72m/m2 (only D1 for 50% dose reduction) and Rituxan 375 mg/ms on 08/02/17.    HISTORY OF PRESENTING ILLNESS:   Emily JLyndell Gillyardis a wonderful 72y.o. female who has been referred to [facility] my colleague Dr MGrace Isaacfor evaluation and management of MDS and lymphoma. TShe presents prior to the start of C2 of her Bendamustine Rituxan treatment. She is accompanied today by her daughter. The pt reports that she is doing well overall.   The pt was first diagnosed with MDS on 07/08/17 after a bone marrow biopsy on 06/22/17 revealed dysplasia in the megakaryocytes. The same biopsy also revealed lymphoid aggregates, and an atypical distribution and kappa-restriction of plasma cells, without monoclonal B or T cell populations. The pt's 07/08/17 labs showed IgM elevation at 1926, IgG at 131, and IgA<5 with an M spike of 1.1 g/dL from SPEP. 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 She began Bendamustine 950mm2, dose reduced to one day of treatment, and Rituxan 375 mg/ms on 08/02/17.   The pt reports that she tolerated C1 very well.  She notes that she had a blood transfusion 2 weeks ago,  She had a fever of 100.7 which resolved by the time she presented to the ED   The pt notes that she took Methotrexate 20 years ago and took 1042mrednisone intermittently. She notes that her RA has not been too bothersome.  She notes that she has leg and arm swelling, is taking 53m42mrsemide, and sees her PCP Dr Kim Maudie Mercuryarding this. She notes that her weight was around 140 pounds 4-6 weeks ago, but presents today at 181 pounds. She notes that her increased swelling and weight gain is completely new in the last month. Review of her 08/02/17 weight in clinic shows she was at 157 pounds.   She has been taking Amlodipine for about a year ago, She has been taking her thyroid replacement with coffee and milk. She is continuing to take Vitamin B12 and Zinc replacement.   Most recent lab results (08/30/17) of CBC, CMP  is as follows: all values are WNL except for WBC at 3.7k, RBC at 3.09, HGB at 8.7, HCT at 26.6, PLT at 97k, Lymphs abs at 200, Sodium at 134, CO2 at 21, Glucose at 141, BUN at 33, Creatinine at 1.48, Calcium at 8.2, Total Protein at 4.9, Albumin at 2.1, Total Bilirubin at <0.2, GFR at 35. LDH 08/30/17 is WNL at 195  On review of systems, pt reports moving her  bowels well, leg swelling, hand swelling, and denies nausea, chills, bleeding, SOB, discomfort at port site, back pains, abdominal pains, and any other symptoms.   MEDICAL HISTORY:  Past Medical History:  Diagnosis Date  . CHF (congestive heart failure) ([place])   . Diabetes mellitus   . Hypertension   . Thyroid disease   . Vasculitis ([place])     SURGICAL HISTORY: Past Surgical  History:  Procedure Laterality Date  . IR FLUORO GUIDE PORT INSERTION RIGHT  07/26/2017  . IR US GUIDE VASC ACCESS RIGHT  07/26/2017  . MELANOMA EXCISION    . TUBAL LIGATION      SOCIAL HISTORY: Social History   Socioeconomic History  . Marital status: Divorced    Spouse name: Not on file  . Number of children: Not on file  . Years of education: Not on file  . Highest education level: Not on file  Occupational History  . Occupation: Retired  Scientific laboratory technician  . Financial resource strain: Not on file  . Food insecurity:    Worry: Not on file    Inability: Not on file  . Transportation needs:    Medical: Not on file    Non-medical: Not on file  Tobacco Use  . Smoking status: Never Smoker  . Smokeless tobacco: Never Used  Substance and Sexual Activity  . Alcohol use: No  . Drug use: No  . Sexual activity: Not Currently  Lifestyle  . Physical activity:    Days per week: Not on file    Minutes per session: Not on file  . Stress: Not on file  Relationships  . Social connections:    Talks on phone: Not on file    Gets together: Not on file    Attends religious service: Not on file    Active member of club or organization: Not on file    Attends meetings of clubs or organizations: Not on file    Relationship status: Not on file  . Intimate partner violence:    Fear of current or ex partner: Not on file    Emotionally abused: Not on file    Physically abused: Not on file    Forced sexual activity: Not on file  Other Topics Concern  . Not on file  Social History Narrative  . Not on file    FAMILY HISTORY: Family History  Problem Relation Age of Onset  . Cancer Sister   . Diabetes Mellitus II Mother   . Tuberculosis Father     ALLERGIES:  is allergic to benadryl [diphenhydramine]; ciprofloxacin; furosemide; nitrofurantoin monohyd macro; septra [bactrim]; sulfa drugs cross reactors; allopurinol; and ceclor [cefaclor].  MEDICATIONS:  Current Outpatient Medications    Medication Sig Dispense Refill  . acetaminophen (TYLENOL) 325 MG tablet Take 2 tablets (650 mg total) by mouth every 6 (six) hours as needed for fever. (Patient not taking: Reported on 08/14/2017)    . acyclovir (ZOVIRAX) 400 MG tablet Take 1 tablet (400 mg total) by mouth daily. (Patient not taking: Reported on 08/14/2017) 30 tablet 3  . allopurinol (ZYLOPRIM) 300 MG tablet Take 1 tablet (300 mg total) by mouth daily. (Patient not taking: Reported on 08/14/2017) 30 tablet 3  . amLODipine (NORVASC) 2.5 MG tablet Take 2.5 mg by mouth daily.    . Calcium-Magnesium-Vitamin D (CALCIUM 500 PO) Take 500 mg by mouth every morning.    . carvedilol (COREG) 25 MG tablet Take 1 tablet (25 mg total) by mouth 2 (two) times daily.    Marland Kitchen  cholecalciferol (VITAMIN D) 400 units TABS tablet Take 400 Units by mouth daily.    Marland Kitchen dexamethasone (DECADRON) 4 MG tablet Take 2 tablets (8 mg total) by mouth daily. Start the day after bendamustine chemotherapy for 2 days. Take with food. 30 tablet 1  . diltiazem (CARDIZEM) 90 MG tablet Take 90 mg by mouth 3 (three) times daily. Midnight, 6am, noon, 6pm     . DULoxetine (CYMBALTA) 30 MG capsule Take 30 mg by mouth daily.    . ferrous sulfate 325 (65 FE) MG tablet Take 325 mg by mouth 2 (two) times daily with a meal.    . GLUCERNA (GLUCERNA) LIQD Take 237 mLs by mouth daily.     Marland Kitchen glucosamine-chondroitin 500-400 MG tablet Take 2 tablets by mouth daily.     . hydrALAZINE (APRESOLINE) 100 MG tablet Take 100 mg by mouth 3 (three) times daily.    Marland Kitchen HYDROcodone-acetaminophen (NORCO/VICODIN) 5-325 MG tablet Take 1-2 tablets by mouth every 6 (six) hours as needed. (Patient not taking: Reported on 08/14/2017) 20 tablet 0  . levothyroxine (SYNTHROID, LEVOTHROID) 112 MCG tablet Take 224 mcg by mouth daily before breakfast.     . lidocaine-prilocaine (EMLA) cream Apply to affected area once 30 g 3  . lisinopril (PRINIVIL,ZESTRIL) 40 MG tablet Take 40 mg by mouth daily.     Marland Kitchen LORazepam (ATIVAN)  0.5 MG tablet Take 1 tablet (0.5 mg total) by mouth every 6 (six) hours as needed (Nausea or vomiting). 30 tablet 0  . Multiple Vitamin (MULTIVITAMIN) tablet Take 1 tablet by mouth daily.    . Omega-3 Fatty Acids (FISH OIL) 1000 MG CAPS Take 4,000 mg by mouth daily.    . ondansetron (ZOFRAN) 8 MG tablet Take 1 tablet (8 mg total) by mouth 2 (two) times daily as needed for refractory nausea / vomiting. Start on day 2 after bendamustine chemotherapy. 30 tablet 1  . prochlorperazine (COMPAZINE) 10 MG tablet Take 1 tablet (10 mg total) by mouth every 6 (six) hours as needed (Nausea or vomiting). 30 tablet 1  . torsemide (DEMADEX) 10 MG tablet Take 2 tablets (20 mg total) by mouth daily. (Patient taking differently: Take 20 mg by mouth daily as needed (edema). ) 30 tablet 0  . vitamin B-12 (CYANOCOBALAMIN) 500 MCG tablet Take 500 mcg by mouth daily.    Marland Kitchen zinc gluconate 50 MG tablet Take 50 mg by mouth daily.     No current facility-administered medications for this visit.     REVIEW OF SYSTEMS:    10 Point review of Systems was done is negative except as noted above.  PHYSICAL EXAMINATION: ECOG PERFORMANCE STATUS: 2 - Symptomatic, <50% confined to bed  . Vitals:   08/30/17 0959  BP: (!) 195/93  Pulse: (!) 101  Resp: 18  Temp: 98.3 F (36.8 C)  SpO2: 98%   Filed Weights   08/30/17 0959  Weight: 181 lb 9.6 oz (82.4 kg)   .Body mass index is 31.17 kg/m.  GENERAL:alert, in no acute distress and comfortable SKIN: no acute rashes, no significant lesions EYES: conjunctiva are pink and non-injected, sclera anicteric OROPHARYNX: MMM, no exudates, no oropharyngeal erythema or ulceration NECK: supple, no JVD LYMPH:  no palpable lymphadenopathy in the cervical, axillary or inguinal regions LUNGS: clear to auscultation b/l with normal respiratory effort HEART: regular rate & rhythm ABDOMEN:  normoactive bowel sounds , non tender, not distended. Extremity: 2+ pedal edema PSYCH: alert &  oriented x 3 with fluent speech NEURO: no focal  motor/sensory deficits  LABORATORY DATA:  I have reviewed the data as listed  . CBC Latest Ref Rng & Units 08/30/2017 08/19/2017 08/14/2017  WBC 3.9 - 10.3 K/uL 3.7(L) 2.4(L) 3.5(L)  Hemoglobin 11.6 - 15.9 g/dL 8.7(L) 8.3(L) 7.7(L)  Hematocrit 34.8 - 46.6 % 26.6(L) 25.1(L) 23.1(L)  Platelets 145 - 400 K/uL 97(L) 84(L) 84(L)    . CMP Latest Ref Rng & Units 08/30/2017 08/14/2017 08/10/2017  Glucose 70 - 140 mg/dL 141(H) 152(H) 112  BUN 7 - 26 mg/dL 33(H) 41(H) 31(H)  Creatinine 0.60 - 1.10 mg/dL 1.48(H) 1.40(H) 1.20(H)  Sodium 136 - 145 mmol/L 134(L) 132(L) 135(L)  Potassium 3.5 - 5.1 mmol/L 4.4 4.3 4.8  Chloride 98 - 109 mmol/L 108 108 111(H)  CO2 22 - 29 mmol/L 21(L) 20(L) 18(L)  Calcium 8.4 - 10.4 mg/dL 8.2(L) 7.9(L) 8.3(L)  Total Protein 6.4 - 8.3 g/dL 4.9(L) 4.7(L) 5.1(L)  Total Bilirubin 0.2 - 1.2 mg/dL <0.2(L) 0.3 0.2  Alkaline Phos 40 - 150 U/L 64 42 47  AST 5 - 34 U/L 13 14(L) 20  ALT 0 - 55 U/L 8 18 18    06/22/17 BM Bx:     06/22/17 Molecular Pathology:   06/22/17 Cytogenetics:     RADIOGRAPHIC STUDIES: I have personally reviewed the radiological images as listed and agreed with the findings in the report.   ASSESSMENT & PLAN:  72 y.o. female with  1. Myelodysplastic syndrome -Medication related MDS with patient history of taking Methotrexate 20 years ago ?  2. 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 MYD88 - undetected. -Discussed patient's most recent labs from 08/30/17, Albumin at 4.9, Creatinine at 1.48, Calcium at 8.2, HGB at 8.7, PLT at 97k -08/02/17 Hep B core antibody was positive, surface antigens negative -Will begin C2 with Rituxan today, and only one day of 55m/m2 Bendamustine (for 50% dose reduction) -Will see pt back in 2 weeks with labs  3. Lymphedema  -Begin taking thyroid replacement with water only, and wait an hour  and a half before eating.  -Will increase Torsemide to 464mand suggest that pt keep a weight diary and follow up with Dr KiMaudie MercurySuggest pt discuss her two calcium channel blocking medications with PCP Dr KiMaudie Mercury-Will refer to dietician for severe protein malnutrition as well -08/30/17 Albumin at 2.1, Total Protein at 4.9   RTC with Dr KaIrene Limbon 2 weeks with labs Please schedule C3 of BR as per orders Dietician referral for severe protein calorie malnutrition   All of the patients questions were answered with apparent satisfaction. The patient knows to call the clinic with any problems, questions or concerns.  The toal time spent in the appt was 25 minutes and more than 50% was on counseling and direct patient cares.    GaSullivan LoneD MS AAHIVMS SCUniversity Surgery Center LtdTIowa City Ambulatory Surgical Center LLCematology/Oncology Physician CoMethodist Surgery Center Germ[facility] LP(Office):       [phone] cell):  [phone]):           [phone]/18/2019 10:50 AM  I, ScBaldwin Jamaicaam acting as a scEducation administratoror Dr KaIrene Limbo  .I have reviewed the above documentation for accuracy and completeness, and I agree with the above. .GBrunetta GeneraD

## 2017-08-29 NOTE — Telephone Encounter (Signed)
Appointment scheduled per 6/17 sch msg

## 2017-08-30 ENCOUNTER — Inpatient Hospital Stay: Payer: Medicare Other

## 2017-08-30 ENCOUNTER — Inpatient Hospital Stay (HOSPITAL_BASED_OUTPATIENT_CLINIC_OR_DEPARTMENT_OTHER): Payer: Medicare Other | Admitting: Hematology

## 2017-08-30 VITALS — BP 159/78 | HR 90 | Temp 98.9°F | Resp 18

## 2017-08-30 VITALS — BP 195/93 | HR 101 | Temp 98.3°F | Resp 18 | Ht 64.0 in | Wt 181.6 lb

## 2017-08-30 DIAGNOSIS — Z95828 Presence of other vascular implants and grafts: Secondary | ICD-10-CM

## 2017-08-30 DIAGNOSIS — Z79899 Other long term (current) drug therapy: Secondary | ICD-10-CM | POA: Diagnosis not present

## 2017-08-30 DIAGNOSIS — D61818 Other pancytopenia: Secondary | ICD-10-CM

## 2017-08-30 DIAGNOSIS — E43 Unspecified severe protein-calorie malnutrition: Secondary | ICD-10-CM

## 2017-08-30 DIAGNOSIS — M069 Rheumatoid arthritis, unspecified: Secondary | ICD-10-CM

## 2017-08-30 DIAGNOSIS — C8300 Small cell B-cell lymphoma, unspecified site: Secondary | ICD-10-CM

## 2017-08-30 DIAGNOSIS — I890 Lymphedema, not elsewhere classified: Secondary | ICD-10-CM

## 2017-08-30 DIAGNOSIS — D469 Myelodysplastic syndrome, unspecified: Secondary | ICD-10-CM

## 2017-08-30 DIAGNOSIS — M310 Hypersensitivity angiitis: Secondary | ICD-10-CM | POA: Diagnosis not present

## 2017-08-30 DIAGNOSIS — D472 Monoclonal gammopathy: Secondary | ICD-10-CM | POA: Diagnosis not present

## 2017-08-30 LAB — CMP (CANCER CENTER ONLY)
ALBUMIN: 2.1 g/dL — AB (ref 3.5–5.0)
ALT: 8 U/L (ref 0–55)
AST: 13 U/L (ref 5–34)
Alkaline Phosphatase: 64 U/L (ref 40–150)
Anion gap: 5 (ref 3–11)
BUN: 33 mg/dL — AB (ref 7–26)
CO2: 21 mmol/L — ABNORMAL LOW (ref 22–29)
Calcium: 8.2 mg/dL — ABNORMAL LOW (ref 8.4–10.4)
Chloride: 108 mmol/L (ref 98–109)
Creatinine: 1.48 mg/dL — ABNORMAL HIGH (ref 0.60–1.10)
GFR, Est AFR Am: 40 mL/min — ABNORMAL LOW (ref >60)
GFR, Estimated: 35 mL/min — ABNORMAL LOW (ref >60)
Glucose, Bld: 141 mg/dL — ABNORMAL HIGH (ref 70–140)
POTASSIUM: 4.4 mmol/L (ref 3.5–5.1)
SODIUM: 134 mmol/L — AB (ref 136–145)
TOTAL PROTEIN: 4.9 g/dL — AB (ref 6.4–8.3)

## 2017-08-30 LAB — CBC WITH DIFFERENTIAL (CANCER CENTER ONLY)
BASOS ABS: 0 10*3/uL (ref 0.0–0.1)
Basophils Relative: 1 %
EOS PCT: 3 %
Eosinophils Absolute: 0.1 10*3/uL (ref 0.0–0.5)
HEMATOCRIT: 26.6 % — AB (ref 34.8–46.6)
Hemoglobin: 8.7 g/dL — ABNORMAL LOW (ref 11.6–15.9)
LYMPHS ABS: 0.2 10*3/uL — AB (ref 0.9–3.3)
LYMPHS PCT: 5 %
MCH: 28.2 pg (ref 25.1–34.0)
MCHC: 32.7 g/dL (ref 31.5–36.0)
MCV: 86.1 fL (ref 79.5–101.0)
Monocytes Absolute: 0.3 10*3/uL (ref 0.1–0.9)
Monocytes Relative: 7 %
NEUTROS ABS: 3.2 10*3/uL (ref 1.5–6.5)
Neutrophils Relative %: 84 %
PLATELETS: 97 10*3/uL — AB (ref 145–400)
RBC: 3.09 MIL/uL — ABNORMAL LOW (ref 3.70–5.45)
RDW: 13.9 % (ref 11.2–14.5)
WBC Count: 3.7 10*3/uL — ABNORMAL LOW (ref 3.9–10.3)

## 2017-08-30 LAB — PHOSPHORUS: Phosphorus: 3.5 mg/dL (ref 2.5–4.6)

## 2017-08-30 LAB — LACTATE DEHYDROGENASE: LDH: 195 U/L (ref 125–245)

## 2017-08-30 LAB — MAGNESIUM: Magnesium: 2.1 mg/dL (ref 1.7–2.4)

## 2017-08-30 LAB — URIC ACID: URIC ACID, SERUM: 6.7 mg/dL (ref 2.6–7.4)

## 2017-08-30 MED ORDER — SODIUM CHLORIDE 0.9 % IV SOLN
90.0000 mg/m2 | Freq: Once | INTRAVENOUS | Status: AC
  Administered 2017-08-30: 150 mg via INTRAVENOUS
  Filled 2017-08-30: qty 6

## 2017-08-30 MED ORDER — FUROSEMIDE 10 MG/ML IJ SOLN
INTRAMUSCULAR | Status: AC
Start: 2017-08-30 — End: ?
  Filled 2017-08-30: qty 4

## 2017-08-30 MED ORDER — SODIUM CHLORIDE 0.9 % IV SOLN
375.0000 mg/m2 | Freq: Once | INTRAVENOUS | Status: AC
  Administered 2017-08-30: 600 mg via INTRAVENOUS
  Filled 2017-08-30: qty 60

## 2017-08-30 MED ORDER — HEPARIN SOD (PORK) LOCK FLUSH 100 UNIT/ML IV SOLN
500.0000 [IU] | Freq: Once | INTRAVENOUS | Status: AC | PRN
  Administered 2017-08-30: 500 [IU]
  Filled 2017-08-30: qty 5

## 2017-08-30 MED ORDER — DEXAMETHASONE SODIUM PHOSPHATE 10 MG/ML IJ SOLN
INTRAMUSCULAR | Status: AC
  Filled 2017-08-30: qty 1

## 2017-08-30 MED ORDER — SODIUM CHLORIDE 0.9% FLUSH
10.0000 mL | Freq: Once | INTRAVENOUS | Status: AC
  Administered 2017-08-30: 10 mL via INTRAVENOUS
  Filled 2017-08-30: qty 10

## 2017-08-30 MED ORDER — DEXAMETHASONE SODIUM PHOSPHATE 10 MG/ML IJ SOLN
10.0000 mg | Freq: Once | INTRAMUSCULAR | Status: AC
  Administered 2017-08-30: 10 mg via INTRAVENOUS

## 2017-08-30 MED ORDER — ACETAMINOPHEN 325 MG PO TABS
650.0000 mg | ORAL_TABLET | Freq: Once | ORAL | Status: AC
  Administered 2017-08-30: 650 mg via ORAL

## 2017-08-30 MED ORDER — PALONOSETRON HCL INJECTION 0.25 MG/5ML
0.2500 mg | Freq: Once | INTRAVENOUS | Status: AC
  Administered 2017-08-30: 0.25 mg via INTRAVENOUS

## 2017-08-30 MED ORDER — PALONOSETRON HCL INJECTION 0.25 MG/5ML
INTRAVENOUS | Status: AC
  Filled 2017-08-30: qty 5

## 2017-08-30 MED ORDER — SODIUM CHLORIDE 0.9% FLUSH
10.0000 mL | INTRAVENOUS | Status: DC | PRN
  Administered 2017-08-30: 10 mL
  Filled 2017-08-30: qty 10

## 2017-08-30 MED ORDER — FUROSEMIDE 10 MG/ML IJ SOLN
40.0000 mg | Freq: Once | INTRAMUSCULAR | Status: AC
  Administered 2017-08-30: 40 mg via INTRAVENOUS

## 2017-08-30 MED ORDER — SODIUM CHLORIDE 0.9 % IV SOLN
Freq: Once | INTRAVENOUS | Status: AC
  Administered 2017-08-30: 11:00:00 via INTRAVENOUS

## 2017-08-30 MED ORDER — ACETAMINOPHEN 325 MG PO TABS
ORAL_TABLET | ORAL | Status: AC
Start: 2017-08-30 — End: ?
  Filled 2017-08-30: qty 2

## 2017-08-30 NOTE — Progress Notes (Signed)
Per [name], okay to treat pt with abnormal labs.

## 2017-08-30 NOTE — Progress Notes (Signed)
Per [name], ok to treat with Plt count of 97

## 2017-08-30 NOTE — Patient Instructions (Signed)
[facility] Discharge Instructions for Patients Receiving Chemotherapy  Today you received the following chemotherapy agents:  Rituxan and Bendeka.  To help prevent nausea and vomiting after your treatment, we encourage you to take your nausea medication as directed.   If you develop nausea and vomiting that is not controlled by your nausea medication, call the clinic.   BELOW ARE SYMPTOMS THAT SHOULD BE REPORTED IMMEDIATELY:  *FEVER GREATER THAN 100.5 F  *CHILLS WITH OR WITHOUT FEVER  NAUSEA AND VOMITING THAT IS NOT CONTROLLED WITH YOUR NAUSEA MEDICATION  *UNUSUAL SHORTNESS OF BREATH  *UNUSUAL BRUISING OR BLEEDING  TENDERNESS IN MOUTH AND THROAT WITH OR WITHOUT PRESENCE OF ULCERS  *URINARY PROBLEMS  *BOWEL PROBLEMS  UNUSUAL RASH Items with * indicate a potential emergency and should be followed up as soon as possible.  Feel free to call the clinic should you have any questions or concerns. The clinic phone number is [phone].  Please show the CHEMO ALERT CARD at check-in to the Emergency Department and triage nurse.   

## 2017-08-30 NOTE — Patient Instructions (Signed)
Implanted Port Home Guide An implanted port is a type of central line that is placed under the skin. Central lines are used to provide IV access when treatment or nutrition needs to be given through a person's veins. Implanted ports are used for long-term IV access. An implanted port may be placed because:  You need IV medicine that would be irritating to the small veins in your hands or arms.  You need long-term IV medicines, such as antibiotics.  You need IV nutrition for a long period.  You need frequent blood draws for lab tests.  You need dialysis.  Implanted ports are usually placed in the chest area, but they can also be placed in the upper arm, the abdomen, or the leg. An implanted port has two main parts:  Reservoir. The reservoir is round and will appear as a small, raised area under your skin. The reservoir is the part where a needle is inserted to give medicines or draw blood.  Catheter. The catheter is a thin, flexible tube that extends from the reservoir. The catheter is placed into a large vein. Medicine that is inserted into the reservoir goes into the catheter and then into the vein.  How will I care for my incision site? Do not get the incision site wet. Bathe or shower as directed by your health care provider. How is my port accessed? Special steps must be taken to access the port:  Before the port is accessed, a numbing cream can be placed on the skin. This helps numb the skin over the port site.  Your health care provider uses a sterile technique to access the port. ? Your health care provider must put on a mask and sterile gloves. ? The skin over your port is cleaned carefully with an antiseptic and allowed to dry. ? The port is gently pinched between sterile gloves, and a needle is inserted into the port.  Only "non-coring" port needles should be used to access the port. Once the port is accessed, a blood return should be checked. This helps ensure that the port  is in the vein and is not clogged.  If your port needs to remain accessed for a constant infusion, a clear (transparent) bandage will be placed over the needle site. The bandage and needle will need to be changed every week, or as directed by your health care provider.  Keep the bandage covering the needle clean and dry. Do not get it wet. Follow your health care provider's instructions on how to take a shower or bath while the port is accessed.  If your port does not need to stay accessed, no bandage is needed over the port.  What is flushing? Flushing helps keep the port from getting clogged. Follow your health care provider's instructions on how and when to flush the port. Ports are usually flushed with saline solution or a medicine called heparin. The need for flushing will depend on how the port is used.  If the port is used for intermittent medicines or blood draws, the port will need to be flushed: ? After medicines have been given. ? After blood has been drawn. ? As part of routine maintenance.  If a constant infusion is running, the port may not need to be flushed.  How long will my port stay implanted? The port can stay in for as long as your health care provider thinks it is needed. When it is time for the port to come out, surgery will be   done to remove it. The procedure is similar to the one performed when the port was put in. When should I seek immediate medical care? When you have an implanted port, you should seek immediate medical care if:  You notice a bad smell coming from the incision site.  You have swelling, redness, or drainage at the incision site.  You have more swelling or pain at the port site or the surrounding area.  You have a fever that is not controlled with medicine.  This information is not intended to replace advice given to you by your health care provider. Make sure you discuss any questions you have with your health care provider. Document  Released: 03/01/2005 Document Revised: 08/07/2015 Document Reviewed: 11/06/2012 Elsevier Interactive Patient Education  2017 [employer]  

## 2017-08-31 ENCOUNTER — Emergency Department (HOSPITAL_COMMUNITY): Payer: Medicare Other

## 2017-08-31 ENCOUNTER — Inpatient Hospital Stay: Payer: Medicare Other

## 2017-08-31 ENCOUNTER — Telehealth: Payer: Self-pay

## 2017-08-31 ENCOUNTER — Inpatient Hospital Stay (HOSPITAL_COMMUNITY)
Admission: EM | Admit: 2017-08-31 | Discharge: 2017-09-06 | DRG: 689 | Disposition: A | Discharge status: Skilled Nursing Facility | Payer: Medicare Other | Attending: Internal Medicine | Admitting: Internal Medicine

## 2017-08-31 ENCOUNTER — Encounter (HOSPITAL_COMMUNITY): Payer: Self-pay | Admitting: Family Medicine

## 2017-08-31 DIAGNOSIS — Z9221 Personal history of antineoplastic chemotherapy: Secondary | ICD-10-CM

## 2017-08-31 DIAGNOSIS — J690 Pneumonitis due to inhalation of food and vomit: Secondary | ICD-10-CM | POA: Diagnosis not present

## 2017-08-31 DIAGNOSIS — Z85820 Personal history of malignant melanoma of skin: Secondary | ICD-10-CM

## 2017-08-31 DIAGNOSIS — J9601 Acute respiratory failure with hypoxia: Secondary | ICD-10-CM | POA: Diagnosis not present

## 2017-08-31 DIAGNOSIS — R52 Pain, unspecified: Secondary | ICD-10-CM

## 2017-08-31 DIAGNOSIS — Z833 Family history of diabetes mellitus: Secondary | ICD-10-CM | POA: Diagnosis not present

## 2017-08-31 DIAGNOSIS — D638 Anemia in other chronic diseases classified elsewhere: Secondary | ICD-10-CM | POA: Diagnosis present

## 2017-08-31 DIAGNOSIS — Z66 Do not resuscitate: Secondary | ICD-10-CM | POA: Diagnosis not present

## 2017-08-31 DIAGNOSIS — N3000 Acute cystitis without hematuria: Secondary | ICD-10-CM | POA: Diagnosis not present

## 2017-08-31 DIAGNOSIS — I10 Essential (primary) hypertension: Secondary | ICD-10-CM | POA: Diagnosis present

## 2017-08-31 DIAGNOSIS — Z882 Allergy status to sulfonamides status: Secondary | ICD-10-CM | POA: Diagnosis not present

## 2017-08-31 DIAGNOSIS — Z79899 Other long term (current) drug therapy: Secondary | ICD-10-CM

## 2017-08-31 DIAGNOSIS — Z881 Allergy status to other antibiotic agents status: Secondary | ICD-10-CM | POA: Diagnosis not present

## 2017-08-31 DIAGNOSIS — N390 Urinary tract infection, site not specified: Secondary | ICD-10-CM | POA: Diagnosis present

## 2017-08-31 DIAGNOSIS — D630 Anemia in neoplastic disease: Secondary | ICD-10-CM | POA: Diagnosis present

## 2017-08-31 DIAGNOSIS — E1165 Type 2 diabetes mellitus with hyperglycemia: Secondary | ICD-10-CM | POA: Diagnosis present

## 2017-08-31 DIAGNOSIS — I071 Rheumatic tricuspid insufficiency: Secondary | ICD-10-CM | POA: Diagnosis present

## 2017-08-31 DIAGNOSIS — N179 Acute kidney failure, unspecified: Secondary | ICD-10-CM | POA: Diagnosis present

## 2017-08-31 DIAGNOSIS — E039 Hypothyroidism, unspecified: Secondary | ICD-10-CM | POA: Diagnosis present

## 2017-08-31 DIAGNOSIS — Z888 Allergy status to other drugs, medicaments and biological substances status: Secondary | ICD-10-CM | POA: Diagnosis not present

## 2017-08-31 DIAGNOSIS — R509 Fever, unspecified: Secondary | ICD-10-CM | POA: Diagnosis present

## 2017-08-31 DIAGNOSIS — L03115 Cellulitis of right lower limb: Secondary | ICD-10-CM | POA: Diagnosis present

## 2017-08-31 DIAGNOSIS — I4891 Unspecified atrial fibrillation: Secondary | ICD-10-CM | POA: Diagnosis present

## 2017-08-31 DIAGNOSIS — L03116 Cellulitis of left lower limb: Secondary | ICD-10-CM | POA: Diagnosis present

## 2017-08-31 DIAGNOSIS — Z809 Family history of malignant neoplasm, unspecified: Secondary | ICD-10-CM | POA: Diagnosis not present

## 2017-08-31 DIAGNOSIS — D469 Myelodysplastic syndrome, unspecified: Secondary | ICD-10-CM | POA: Diagnosis not present

## 2017-08-31 DIAGNOSIS — I313 Pericardial effusion (noninflammatory): Secondary | ICD-10-CM | POA: Diagnosis present

## 2017-08-31 DIAGNOSIS — R140 Abdominal distension (gaseous): Secondary | ICD-10-CM

## 2017-08-31 DIAGNOSIS — A419 Sepsis, unspecified organism: Secondary | ICD-10-CM | POA: Diagnosis present

## 2017-08-31 DIAGNOSIS — E785 Hyperlipidemia, unspecified: Secondary | ICD-10-CM | POA: Diagnosis present

## 2017-08-31 DIAGNOSIS — I5032 Chronic diastolic (congestive) heart failure: Secondary | ICD-10-CM | POA: Diagnosis present

## 2017-08-31 DIAGNOSIS — C8590 Non-Hodgkin lymphoma, unspecified, unspecified site: Secondary | ICD-10-CM | POA: Diagnosis not present

## 2017-08-31 DIAGNOSIS — D696 Thrombocytopenia, unspecified: Secondary | ICD-10-CM | POA: Diagnosis present

## 2017-08-31 DIAGNOSIS — C946 Myelodysplastic disease, not classified: Secondary | ICD-10-CM | POA: Diagnosis present

## 2017-08-31 DIAGNOSIS — D72819 Decreased white blood cell count, unspecified: Secondary | ICD-10-CM | POA: Diagnosis not present

## 2017-08-31 DIAGNOSIS — I110 Hypertensive heart disease with heart failure: Secondary | ICD-10-CM | POA: Diagnosis present

## 2017-08-31 DIAGNOSIS — E46 Unspecified protein-calorie malnutrition: Secondary | ICD-10-CM | POA: Diagnosis present

## 2017-08-31 DIAGNOSIS — Z87440 Personal history of urinary (tract) infections: Secondary | ICD-10-CM | POA: Diagnosis not present

## 2017-08-31 DIAGNOSIS — D61818 Other pancytopenia: Secondary | ICD-10-CM | POA: Diagnosis present

## 2017-08-31 DIAGNOSIS — Z9981 Dependence on supplemental oxygen: Secondary | ICD-10-CM | POA: Diagnosis not present

## 2017-08-31 DIAGNOSIS — E118 Type 2 diabetes mellitus with unspecified complications: Secondary | ICD-10-CM | POA: Diagnosis not present

## 2017-08-31 DIAGNOSIS — R188 Other ascites: Secondary | ICD-10-CM | POA: Diagnosis present

## 2017-08-31 DIAGNOSIS — R601 Generalized edema: Secondary | ICD-10-CM | POA: Diagnosis not present

## 2017-08-31 DIAGNOSIS — C8300 Small cell B-cell lymphoma, unspecified site: Secondary | ICD-10-CM | POA: Diagnosis present

## 2017-08-31 DIAGNOSIS — I340 Nonrheumatic mitral (valve) insufficiency: Secondary | ICD-10-CM | POA: Diagnosis not present

## 2017-08-31 LAB — URINALYSIS, ROUTINE W REFLEX MICROSCOPIC
Bilirubin Urine: NEGATIVE
Glucose, UA: 50 mg/dL — AB
Ketones, ur: NEGATIVE mg/dL
Nitrite: NEGATIVE
PH: 5 (ref 5.0–8.0)
Protein, ur: >=300 mg/dL — AB
RBC / HPF: >50 RBC/hpf — ABNORMAL HIGH (ref 0–5)
SPECIFIC GRAVITY, URINE: 1.014 (ref 1.005–1.030)
WBC, UA: >50 WBC/hpf — ABNORMAL HIGH (ref 0–5)

## 2017-08-31 LAB — CBC WITH DIFFERENTIAL/PLATELET
Basophils Absolute: 0 10*3/uL (ref 0.0–0.1)
Basophils Relative: 0 %
EOS PCT: 1 %
Eosinophils Absolute: 0.1 10*3/uL (ref 0.0–0.7)
HCT: 24.9 % — ABNORMAL LOW (ref 36.0–46.0)
Hemoglobin: 8.5 g/dL — ABNORMAL LOW (ref 12.0–15.0)
Lymphocytes Relative: 3 %
Lymphs Abs: 0.1 10*3/uL — ABNORMAL LOW (ref 0.7–4.0)
MCH: 29.9 pg (ref 26.0–34.0)
MCHC: 34.1 g/dL (ref 30.0–36.0)
MCV: 87.7 fL (ref 78.0–100.0)
MONO ABS: 0.2 10*3/uL (ref 0.1–1.0)
Monocytes Relative: 4 %
NEUTROS ABS: 4.5 10*3/uL (ref 1.7–7.7)
Neutrophils Relative %: 92 %
PLATELETS: 112 10*3/uL — AB (ref 150–400)
RBC: 2.84 MIL/uL — AB (ref 3.87–5.11)
RDW: 13.6 % (ref 11.5–15.5)
WBC: 4.9 10*3/uL (ref 4.0–10.5)

## 2017-08-31 LAB — COMPREHENSIVE METABOLIC PANEL
ALT: 14 U/L (ref 14–54)
AST: 16 U/L (ref 15–41)
Albumin: 2.3 g/dL — ABNORMAL LOW (ref 3.5–5.0)
Alkaline Phosphatase: 54 U/L (ref 38–126)
Anion gap: 5 (ref 5–15)
BUN: 41 mg/dL — AB (ref 6–20)
CALCIUM: 8.1 mg/dL — AB (ref 8.9–10.3)
CO2: 20 mmol/L — AB (ref 22–32)
Chloride: 109 mmol/L (ref 101–111)
Creatinine, Ser: 1.61 mg/dL — ABNORMAL HIGH (ref 0.44–1.00)
GFR, EST AFRICAN AMERICAN: 36 mL/min — AB (ref >60)
GFR, EST NON AFRICAN AMERICAN: 31 mL/min — AB (ref >60)
Glucose, Bld: 199 mg/dL — ABNORMAL HIGH (ref 65–99)
Potassium: 4.6 mmol/L (ref 3.5–5.1)
Sodium: 134 mmol/L — ABNORMAL LOW (ref 135–145)
Total Bilirubin: 0.6 mg/dL (ref 0.3–1.2)
Total Protein: 4.8 g/dL — ABNORMAL LOW (ref 6.5–8.1)

## 2017-08-31 LAB — IGG, IGA, IGM
IgA: <5 mg/dL — ABNORMAL LOW (ref 87–352)
IgG (Immunoglobin G), Serum: 90 mg/dL — ABNORMAL LOW (ref 700–1600)
IgM (Immunoglobulin M), Srm: 1114 mg/dL — ABNORMAL HIGH (ref 26–217)

## 2017-08-31 LAB — PROTIME-INR
INR: 1.06
PROTHROMBIN TIME: 13.7 s (ref 11.4–15.2)

## 2017-08-31 LAB — CK: CK TOTAL: 42 U/L (ref 38–234)

## 2017-08-31 LAB — TSH: TSH: 13.079 u[IU]/mL — ABNORMAL HIGH (ref 0.350–4.500)

## 2017-08-31 LAB — MAGNESIUM: MAGNESIUM: 2.2 mg/dL (ref 1.7–2.4)

## 2017-08-31 LAB — I-STAT CG4 LACTIC ACID, ED: Lactic Acid, Venous: 1.05 mmol/L (ref 0.5–1.9)

## 2017-08-31 LAB — CK TOTAL AND CKMB (NOT AT ARMC)
CK, MB: 3.2 ng/mL (ref 0.5–5.0)
RELATIVE INDEX: INVALID (ref 0.0–2.5)
Total CK: 40 U/L (ref 38–234)

## 2017-08-31 LAB — BETA 2 MICROGLOBULIN, SERUM: BETA 2 MICROGLOBULIN: 7.7 mg/L — AB (ref 0.6–2.4)

## 2017-08-31 MED ORDER — SODIUM CHLORIDE 0.9 % IV BOLUS
1000.0000 mL | Freq: Once | INTRAVENOUS | Status: AC
  Administered 2017-08-31: 1000 mL via INTRAVENOUS

## 2017-08-31 MED ORDER — LIDOCAINE-PRILOCAINE 2.5-2.5 % EX CREA
TOPICAL_CREAM | CUTANEOUS | Status: DC | PRN
  Filled 2017-08-31: qty 5

## 2017-08-31 MED ORDER — DILTIAZEM HCL 90 MG PO TABS
90.0000 mg | ORAL_TABLET | Freq: Three times a day (TID) | ORAL | Status: DC
  Administered 2017-08-31 – 2017-09-04 (×11): 90 mg via ORAL
  Filled 2017-08-31 (×11): qty 1

## 2017-08-31 MED ORDER — CARVEDILOL 25 MG PO TABS
25.0000 mg | ORAL_TABLET | Freq: Two times a day (BID) | ORAL | Status: DC
  Administered 2017-08-31 – 2017-09-02 (×4): 25 mg via ORAL
  Administered 2017-09-02: 12.5 mg via ORAL
  Administered 2017-09-03 – 2017-09-06 (×7): 25 mg via ORAL
  Filled 2017-08-31 (×3): qty 1
  Filled 2017-08-31: qty 2
  Filled 2017-08-31 (×3): qty 1
  Filled 2017-08-31 (×3): qty 2
  Filled 2017-08-31 (×2): qty 1

## 2017-08-31 MED ORDER — ONDANSETRON HCL 4 MG PO TABS: 4.0000 mg | ORAL_TABLET | Freq: Four times a day (QID) | ORAL | Status: DC | PRN

## 2017-08-31 MED ORDER — AMLODIPINE BESYLATE 5 MG PO TABS
2.5000 mg | ORAL_TABLET | Freq: Every day | ORAL | Status: DC
  Administered 2017-08-31 – 2017-09-01 (×2): 2.5 mg via ORAL
  Filled 2017-08-31 (×2): qty 1

## 2017-08-31 MED ORDER — GLUCERNA SHAKE PO LIQD
237.0000 mL | Freq: Three times a day (TID) | ORAL | Status: DC
  Administered 2017-09-01 – 2017-09-06 (×13): 237 mL via ORAL
  Filled 2017-08-31 (×19): qty 237

## 2017-08-31 MED ORDER — DEXAMETHASONE 4 MG PO TABS: 8.0000 mg | ORAL_TABLET | Freq: Every day | ORAL | Status: DC

## 2017-08-31 MED ORDER — ONDANSETRON HCL 4 MG/2ML IJ SOLN
4.0000 mg | Freq: Four times a day (QID) | INTRAMUSCULAR | Status: DC | PRN
  Administered 2017-09-01 – 2017-09-05 (×3): 4 mg via INTRAVENOUS
  Filled 2017-08-31 (×3): qty 2

## 2017-08-31 MED ORDER — FERROUS SULFATE 325 (65 FE) MG PO TABS
325.0000 mg | ORAL_TABLET | Freq: Two times a day (BID) | ORAL | Status: DC
  Administered 2017-09-01 – 2017-09-06 (×12): 325 mg via ORAL
  Filled 2017-08-31 (×13): qty 1

## 2017-08-31 MED ORDER — ACETAMINOPHEN 325 MG PO TABS
650.0000 mg | ORAL_TABLET | Freq: Four times a day (QID) | ORAL | Status: DC | PRN
  Administered 2017-09-01 – 2017-09-02 (×2): 650 mg via ORAL
  Filled 2017-08-31 (×2): qty 2

## 2017-08-31 MED ORDER — LEVOTHYROXINE SODIUM 112 MCG PO TABS
224.0000 ug | ORAL_TABLET | Freq: Every day | ORAL | Status: DC
  Administered 2017-09-01 – 2017-09-06 (×6): 224 ug via ORAL
  Filled 2017-08-31 (×6): qty 2

## 2017-08-31 MED ORDER — FUROSEMIDE 10 MG/ML IJ SOLN
40.0000 mg | Freq: Once | INTRAMUSCULAR | Status: AC
  Administered 2017-08-31: 40 mg via INTRAVENOUS
  Filled 2017-08-31: qty 4

## 2017-08-31 MED ORDER — ACETAMINOPHEN 650 MG RE SUPP
650.0000 mg | Freq: Four times a day (QID) | RECTAL | Status: DC | PRN
  Administered 2017-09-01: 650 mg via RECTAL
  Filled 2017-08-31: qty 1

## 2017-08-31 MED ORDER — CHLORHEXIDINE GLUCONATE 0.12 % MT SOLN
15.0000 mL | Freq: Two times a day (BID) | OROMUCOSAL | Status: DC
  Administered 2017-09-01 – 2017-09-06 (×11): 15 mL via OROMUCOSAL
  Filled 2017-08-31 (×11): qty 15

## 2017-08-31 MED ORDER — PIPERACILLIN-TAZOBACTAM 3.375 G IVPB 30 MIN
3.3750 g | Freq: Once | INTRAVENOUS | Status: AC
  Administered 2017-08-31: 3.375 g via INTRAVENOUS
  Filled 2017-08-31: qty 50

## 2017-08-31 MED ORDER — TRAMADOL HCL 50 MG PO TABS
50.0000 mg | ORAL_TABLET | Freq: Two times a day (BID) | ORAL | Status: DC | PRN
  Administered 2017-09-01 – 2017-09-05 (×5): 50 mg via ORAL
  Filled 2017-08-31 (×5): qty 1

## 2017-08-31 MED ORDER — ORAL CARE MOUTH RINSE
15.0000 mL | Freq: Two times a day (BID) | OROMUCOSAL | Status: DC
  Administered 2017-09-01 – 2017-09-05 (×6): 15 mL via OROMUCOSAL

## 2017-08-31 NOTE — Telephone Encounter (Signed)
Spoke with patient concerning her NUT appointment and she canceled it and stated she will r/s after the appointment with  [facility]. Due to it was not discussed with her on last office visit. Per 6/18 los follow up.

## 2017-08-31 NOTE — Patient Instructions (Signed)
[facility] Discharge Instructions for Patients Receiving Chemotherapy  Today you received the following chemotherapy agents Bendeka.  To help prevent nausea and vomiting after your treatment, we encourage you to take your nausea medication as directed.   If you develop nausea and vomiting that is not controlled by your nausea medication, call the clinic.   BELOW ARE SYMPTOMS THAT SHOULD BE REPORTED IMMEDIATELY:  *FEVER GREATER THAN 100.5 F  *CHILLS WITH OR WITHOUT FEVER  NAUSEA AND VOMITING THAT IS NOT CONTROLLED WITH YOUR NAUSEA MEDICATION  *UNUSUAL SHORTNESS OF BREATH  *UNUSUAL BRUISING OR BLEEDING  TENDERNESS IN MOUTH AND THROAT WITH OR WITHOUT PRESENCE OF ULCERS  *URINARY PROBLEMS  *BOWEL PROBLEMS  UNUSUAL RASH Items with * indicate a potential emergency and should be followed up as soon as possible.  Feel free to call the clinic should you have any questions or concerns. The clinic phone number is [phone].  Please show the CHEMO ALERT CARD at check-in to the Emergency Department and triage nurse.   

## 2017-08-31 NOTE — ED Notes (Signed)
Bed: VU13 Expected date:  Expected time:  Means of arrival:  Comments: EMS fever

## 2017-08-31 NOTE — H&P (Signed)
History and Physical    Emily Kennedy RKY:[id number] DOB: 01-02-1946 DOA: 08/31/2017  Referring MD/NP/PA: Suella Broad, PA-C PCP: [name], MD  Patient coming from: home via EMS    Chief Complaint: Fever  I have personally briefly reviewed patient's old medical records in [place]   HPI: Emily Kennedy is a 72 y.o. female with medical history significant of HTN, HLD, CHF, DM type II, vasculitis, and malignant lymphoma currently receiving chemotherapy followed by [name] of oncology; who presents with complaints of fever.  History is mostly obtained from the patient's daughter as patient defers to her arm.  Over the last several weeks patient has had abdominal and bilateral leg swelling, but in the last week had progressively worsened to the point which patient was having difficulty ambulating.  She was seen at the cancer center yesterday where she received chemotherapy of bendamustine and rituximab.  Patient was also given Lasix 40 mg IV due to swelling.  She had not been taking torsemide.  Since yesterday the patient reports only having urinated once since yesterday.  Today patient reported acute onset of sharp bilateral flank pain(worse on the right flank) and abdominal pain.  Temperature at home was noted to be up to around 101.5 F. Patient tried taking Tylenol and tramadol without relief of symptoms around noon.  In addition to the leg swelling she noticed some mild redness of the legs and generalized malaise.  Denies any significant shortness of breath, cough, chest pain, nausea, vomiting, dysuria, or diarrhea. Per review of records it appears en route with EMS patient was noted to be febrile up to 104.2 F and was given 1000 mg of Tylenol prior to arrival.     ED Course: Upon admission into the emergency department patient was seen to be febrile up to 100.4 F, pulse 85-98, respirations 17-25, blood pressures up to 185/90, and O2 saturation 92-99% on room air.  Labs revealed  WBC 4.9, globin 8.5, platelets 112, sodium 134, BUN 41, creatinine 1.61, albumin 2.3, lactic acid 1.05, and INR 1.06.  Urinalysis was positive for signs of possible infection and blood.  CT scan of the abdomen showed moderate ascites with generalized anasarca and small left pleural and pericardial effusion similar to previous studies.   Patient was given empiric antibiotics of Zosyn and 2 L normal saline IV fluids.  Review of Systems  Constitutional: Positive for fever and malaise/fatigue.  HENT: Negative for congestion and nosebleeds.   Eyes: Negative for pain and discharge.  Respiratory: Negative for cough and shortness of breath.   Cardiovascular: Positive for leg swelling. Negative for chest pain.  Gastrointestinal: Positive for abdominal pain. Negative for nausea and vomiting.  Genitourinary: Positive for flank pain. Negative for dysuria and frequency.  Musculoskeletal: Positive for back pain.  Skin: Positive for rash.  Neurological: Positive for weakness. Negative for focal weakness and loss of consciousness.  Psychiatric/Behavioral: Negative for substance abuse and suicidal ideas.    Past Medical History:  Diagnosis Date  . CHF (congestive heart failure) ([place])   . Diabetes mellitus   . Hypertension   . Thyroid disease   . Vasculitis [facility])     Past Surgical History:  Procedure Laterality Date  . IR FLUORO GUIDE PORT INSERTION RIGHT  07/26/2017  . IR US GUIDE VASC ACCESS RIGHT  07/26/2017  . MELANOMA EXCISION    . TUBAL LIGATION       reports that she has never smoked. She has never used smokeless tobacco. She  reports that she does not drink alcohol or use drugs.  Allergies  Allergen Reactions  . Benadryl [Diphenhydramine] Swelling  . Ciprofloxacin Hives  . Furosemide Itching, Swelling, Rash and Other (See Comments)    Made edema worse  . Nitrofurantoin Monohyd Macro Hives  . Septra [Bactrim] Hives  . Sulfa Drugs Cross Reactors Hives  . Allopurinol Itching  . Ceclor  [Cefaclor] Rash    Breaks out in rash chest down.    Family History  Problem Relation Age of Onset  . Cancer Sister   . Diabetes Mellitus II Mother   . Tuberculosis Father     Prior to Admission medications   Medication Sig Start Date End Date Taking? Authorizing Provider  acetaminophen (TYLENOL) 325 MG tablet Take 2 tablets (650 mg total) by mouth every 6 (six) hours as needed for fever. 09/19/15  Yes Theodis Blaze, MD  amLODipine (NORVASC) 2.5 MG tablet Take 2.5 mg by mouth daily.   Yes [provider]  Calcium-Magnesium-Vitamin D (CALCIUM 500 PO) Take 500 mg by mouth every morning.   Yes [provider]  carvedilol (COREG) 25 MG tablet Take 1 tablet (25 mg total) by mouth 2 (two) times daily. 09/19/15  Yes Theodis Blaze, MD  cholecalciferol (VITAMIN D) 400 units TABS tablet Take 400 Units by mouth daily.   Yes [provider]  dexamethasone (DECADRON) 4 MG tablet Take 2 tablets (8 mg total) by mouth daily. Start the day after bendamustine chemotherapy for 2 days. Take with food. 07/24/17  Yes Ardath Sax, MD  diltiazem (CARDIZEM) 90 MG tablet Take 90 mg by mouth 3 (three) times daily. Midnight, 6am, noon, 6pm    Yes [provider]  ferrous sulfate 325 (65 FE) MG tablet Take 325 mg by mouth 2 (two) times daily with a meal.   Yes [provider]  GLUCERNA (GLUCERNA) LIQD Take 237 mLs by mouth daily.    Yes [provider]  glucosamine-chondroitin 500-400 MG tablet Take 2 tablets by mouth daily.    Yes [provider]  hydrALAZINE (APRESOLINE) 100 MG tablet Take 100 mg by mouth 3 (three) times daily.   Yes [provider]  levothyroxine (SYNTHROID, LEVOTHROID) 112 MCG tablet Take 224 mcg by mouth daily before breakfast.    Yes [provider]  lidocaine-prilocaine (EMLA) cream Apply to affected area once 07/24/17  Yes Perlov, Marinell Blight, MD  lisinopril (PRINIVIL,ZESTRIL) 40 MG tablet Take 40 mg by mouth  daily.    Yes [provider]  Multiple Vitamin (MULTIVITAMIN) tablet Take 1 tablet by mouth daily.   Yes [provider]  Omega-3 Fatty Acids (FISH OIL) 1000 MG CAPS Take 4,000 mg by mouth daily.   Yes [provider]  torsemide (DEMADEX) 10 MG tablet Take 2 tablets (20 mg total) by mouth daily. Patient taking differently: Take 20 mg by mouth daily as needed (edema).  02/16/16  Yes Domenic Polite, MD  traMADol (ULTRAM) 50 MG tablet Take 50 mg by mouth 2 (two) times daily. 08/13/17  Yes [provider]  vitamin B-12 (CYANOCOBALAMIN) 500 MCG tablet Take 500 mcg by mouth daily.   Yes [provider]  zinc gluconate 50 MG tablet Take 50 mg by mouth daily.   Yes [provider]  ondansetron (ZOFRAN) 8 MG tablet Take 1 tablet (8 mg total) by mouth 2 (two) times daily as needed for refractory nausea / vomiting. Start on day 2 after bendamustine chemotherapy. 07/24/17   Perlov,  Marinell Blight, MD  prochlorperazine (COMPAZINE) 10 MG tablet Take 1 tablet (10 mg total) by mouth every 6 (six) hours as needed (Nausea or vomiting). 07/24/17   Ardath Sax, MD    Physical Exam:  Constitutional: Chronically ill-appearing female who appears to be in no acute distress at this time Vitals:   08/31/17 1800 08/31/17 1819 08/31/17 1943 08/31/17 1944  BP: (!) 149/73 (!) 149/73 (!) 149/77   Pulse: 87 88 90   Resp: (!) 23 (!) 22 20   Temp:    98.9 F (37.2 C)  TempSrc:    Rectal  SpO2: 96% 94% 95%   Weight:      Height:       Eyes: PERRL, lids and conjunctivae normal ENMT: Mucous membranes are moist. Posterior pharynx clear of any exudate or lesions.  Neck: normal, supple, no masses, no thyromegaly.  No significant JVD noted Respiratory: Decreased aeration with positive crackles noted in the left lower lung base. Cardiovascular: Regular rate rhythm, no murmurs / rubs / gallops.  At least 2+ pitting bilateral extremity edema. 2+ pedal pulses. No carotid bruits.   Abdomen: no tenderness, no masses palpated. No hepatosplenomegaly. Bowel sounds positive.  Musculoskeletal: no clubbing / cyanosis. No joint deformity upper and lower extremities. Good ROM, no contractures. Normal muscle tone.  Skin: no rashes, lesions, ulcers. No induration Neurologic: CN 2-12 grossly intact. Sensation intact, DTR normal. Strength 5/5 in all 4.  Psychiatric: Normal judgment and insight. Alert and oriented x 3. Normal mood.     Labs on Admission: I have personally reviewed following labs and imaging studies  CBC: Recent Labs  Lab 08/30/17 0852 08/31/17 1437  WBC 3.7* 4.9  NEUTROABS 3.2 4.5  HGB 8.7* 8.5*  HCT 26.6* 24.9*  MCV 86.1 87.7  PLT 97* 858*   Basic Metabolic Panel: Recent Labs  Lab 08/30/17 0852 08/31/17 1437  NA 134* 134*  K 4.4 4.6  CL 108 109  CO2 21* 20*  GLUCOSE 141* 199*  BUN 33* 41*  CREATININE 1.48* 1.61*  CALCIUM 8.2* 8.1*  MG 2.1 2.2  PHOS 3.5  --    GFR: Estimated Creatinine Clearance: 33.7 mL/min (A) (by C-G formula based on SCr of 1.61 mg/dL (H)). Liver Function Tests: Recent Labs  Lab 08/30/17 0852 08/31/17 1437  AST 13 16  ALT 8 14  ALKPHOS 64 54  BILITOT <0.2* 0.6  PROT 4.9* 4.8*  ALBUMIN 2.1* 2.3*   No results for input(s): LIPASE, AMYLASE in the last 168 hours. No results for input(s): AMMONIA in the last 168 hours. Coagulation Profile: Recent Labs  Lab 08/31/17 1511  INR 1.06   Cardiac Enzymes: Recent Labs  Lab 08/31/17 1437  CKTOTAL 40  CKMB 3.2   BNP (last 3 results) No results for input(s): PROBNP in the last 8760 hours. HbA1C: No results for input(s): HGBA1C in the last 72 hours. CBG: No results for input(s): GLUCAP in the last 168 hours. Lipid Profile: No results for input(s): CHOL, HDL, LDLCALC, TRIG, CHOLHDL, LDLDIRECT in the last 72 hours. Thyroid Function Tests: No results for input(s): TSH, T4TOTAL, FREET4, T3FREE, THYROIDAB in the last 72 hours. Anemia Panel: No results for  input(s): VITAMINB12, FOLATE, FERRITIN, TIBC, IRON, RETICCTPCT in the last 72 hours. Urine analysis:    Component Value Date/Time   COLORURINE YELLOW 08/31/2017 1736   APPEARANCEUR HAZY (A) 08/31/2017 1736   LABSPEC 1.014 08/31/2017 1736   PHURINE 5.0 08/31/2017 1736   GLUCOSEU 50 (A) 08/31/2017 1736  HGBUR MODERATE (A) 08/31/2017 1736   BILIRUBINUR NEGATIVE 08/31/2017 1736   KETONESUR NEGATIVE 08/31/2017 1736   PROTEINUR >=300 (A) 08/31/2017 1736   UROBILINOGEN 1.0 01/06/2011 2252   NITRITE NEGATIVE 08/31/2017 1736   LEUKOCYTESUR LARGE (A) 08/31/2017 1736   Sepsis Labs: No results found for this or any previous visit (from the past 240 hour(s)).   Radiological Exams on Admission: Dg Chest 2 View  Result Date: 08/31/2017 CLINICAL DATA:  Fever. EXAM: CHEST - 2 VIEW COMPARISON:  Chest x-ray dated August 14, 2017. FINDINGS: Unchanged right chest wall port catheter with tip at the cavoatrial junction. Stable cardiomegaly. Normal pulmonary vascularity. Unchanged small left pleural effusion. No consolidation or pneumothorax. No acute osseous abnormality. IMPRESSION: 1. Unchanged small left pleural effusion. Electronically Signed   By: Titus Dubin M.D.   On: 08/31/2017 15:39   Ct Renal Stone Study  Result Date: 08/31/2017 CLINICAL DATA:  Right-sided flank pain. EXAM: CT ABDOMEN AND PELVIS WITHOUT CONTRAST TECHNIQUE: Multidetector CT imaging of the abdomen and pelvis was performed following the standard protocol without IV contrast. COMPARISON:  07/22/2017 PET-CT FINDINGS: Lower chest: Small left and trace right pleural effusion. Moderate pericardial effusion, stable. Small hiatal hernia. Hepatobiliary: High-density liver without focal abnormality. The caudate is prominent but the liver surface is not definitely nodular. Negative gallbladder and common bile duct. Pancreas: Unremarkable. Spleen: Chronic splenomegaly, recently evaluated by PET. AP span is 19 cm. Adrenals/Urinary Tract: Negative  adrenals. No hydronephrosis or stone. Unremarkable bladder. Stomach/Bowel:  No obstruction. No inflammatory changes Vascular/Lymphatic: Atherosclerotic calcification. No mass or adenopathy. Reproductive:Negative Other: Moderate ascites that is non loculated, increased. There is marked anasarca, progressed. Small periumbilical hernia containing ascitic fluid. Musculoskeletal: No acute abnormalities. IMPRESSION: 1. Generalized volume overload. Marked anasarca and moderate ascites has progressed from 07/22/2017. Small left effusion and moderate pericardial effusion are similar to prior. 2. Chronic splenomegaly, reference PET CT last month. Electronically Signed   By: Monte Fantasia M.D.   On: 08/31/2017 18:55    Chest x-ray: Independently reviewed.  Chest x-ray showing small left-sided pleural effusion similar in appearance to previous imaging.  Assessment/Plan Fever secondary to urinary tract infection with hematuria: Acute.  Patient present with a fever found to have acute urinary tract infection.  Patient was started on empiric antibiotics of Zosyn due to immunocompromise status. - Admit to telemetry bed - Follow-up urine culture - Continue empiric antibiotics of Zosyn and de-escalate when medically appropriate -Tylenol as needed for fever  Possible cellulitis: Patient found to have erythema noted of the bilateral lower extremities worse on the right with increased warmth.  Suspect possibly related with swelling. - Continue antibiotics as seen above - Reassess legs in a.m.   Suspect acute kidney injury on chronic kidney disease stage III: At baseline patient's creatinine appears to range from 1.2-1.4.  Patient presents with a mild elevation in creatinine to 1.61 with BUN elevated at 41.  The elevated BUN to creatinine ratio suggest prerenal cause of symptoms.  Patient was given 2 L of normal saline IV fluids in the emergency department. - Add on CK - Recheck BMP in a.m. - Hold additional IV  fluids at this time given history of CHF  Malignant lymphoma: Patient is on chemotherapy under the care of [name].  Last chemotherapy session was 6/18, receiving bendamustine and rituximab.. - Will need to notify [name] in a.m.   Anasarca: Acute on chronic.  Patient with marked swelling of the abdomen and bilateral lower extremities.  Question if  nutrition status leading to worsening peripheral edema vs. thyroid vs. malignancy. - Elevate legs - Give Lasix 40 mg IV x1 dose - Check prealbumin in a.m. - Glucerna shakes  - Physical therapy to ambulate  Diastolic CHF: Last EF 60 to 65% with grade 2 diastolic dysfunction in 03/1171.  Patient appears to be fluid overloaded, but currently in no respiratory distress.. - Strict I&O's and daily weights  Anemia of chronic disease: Hemoglobin initially 8.5 on admission which appears near patient's baseline - Continue ferrous sulfate  Essential hypertension - Continue Coreg, diltiazem, and amlodipine - Hold lisinopril due to AKI  Diabetes mellitus type 2: Patient diet-controlled last hemoglobin A1c noted to be 4.5 back in 06/2015.  On admission blood glucose elevated up to 199. - Hypoglycemic protocol - Heart healthy and carb modified diet - CBGs q. before meals with sensitive SSI  History of atrial fibrillation - Continue diltiazem  Hypothyroidism: TSH last noted to be 1.997 on 06/08/2017. - Check TSH - Continue levothyroxine, and adjust dosage if needed once TSH  Thrombocytopenia: Chronic.  Platelet count noted to be around 112 on admission.  Patient denies any reports of bleeding.  - Continue to monitor   DVT prophylaxis: SCD Code Status: DNR status made by the patient and the patient's daughter present at bedside to witness. Family Communication: Discussed plan of care with the patient family present at bedside Disposition Plan: To be determined Consults called: None Admission status: Inpatient  Norval Morton MD [facility] Pager [phone]   If 7PM-7AM, please contact night-coverage [email or web address] Password [facility]  08/31/2017, 8:13 PM

## 2017-08-31 NOTE — ED Notes (Signed)
Pt has pure wick in place for urine specimen.  

## 2017-08-31 NOTE — ED Notes (Signed)
Patient transported to CT 

## 2017-08-31 NOTE — ED Triage Notes (Signed)
Patient is from home and transported via [facility]. Patient was seen yesterday at the [facility] Patient is experiencing right lower back pain, edema in legs that has been occurring for weeks, and a fever. Patient informed EMS that she received lasix yesterday during treatment. Patient received TYLENOL 1000mg  PO for fever of 104.2 temp.

## 2017-08-31 NOTE — ED Provider Notes (Signed)
[facility] DEPT Provider Note   CSN: [id number] Arrival date & time: 08/31/17  1406     History   Chief Complaint Chief Complaint  Patient presents with  . Fever    HPI Emily Kennedy is a 72 y.o. female.  72yo female brought in by EMS from home for fevers and back pain (over the right kidney), intermittent x 1 month. Patient has a history of malignant lymphoma, last chemo treatment was yesterday, also given a dose of lasix for leg swelling. States legs and abdomen have been swollen for a long time, swelling is better since her lasix dose yesterday, reports only voiding twice since yesterday. Denies CP, SHOB, abdominal pain, nausea, vomiting, changes in bowel habits. Patient states she is not very good with her history and defers to her daughter who is not here yet.  Per daughter, max temp 101.5 today, last tylenol was at 11:55AM today. Chemo today was canceled because of lab work but daughter does not know any other details.      Past Medical History:  Diagnosis Date  . CHF (congestive heart failure) ([place])   . Diabetes mellitus   . Hypertension   . Thyroid disease   . Vasculitis [facility])     Patient Active Problem List   Diagnosis Date Noted  . 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 MDS (myelodysplastic syndrome) ([place]) 07/14/2017  . Solitary pulmonary nodule 02/15/2016  . CHF (congestive heart failure) ([place]) 02/14/2016  . Malnutrition of moderate degree 09/18/2015  . Benign essential HTN   . Vasculitis ([place])   . Rheumatoid arthritis ([place])   . History of recent hospitalization   . Acute blood loss anemia   . Anemia of chronic disease   . Hyponatremia 09/15/2015  . HCAP (healthcare-associated pneumonia) 09/14/2015  . Respiratory arrest ([place]) intubated 4/13 post arrest 06/27/2015  . Cardiac arrest ([place]) 4/13 suspected mucus plugging as cause of  arrest 06/27/2015  . Controlled diabetes mellitus type 2 with complications ([place])   . Dysphagia   . Atrial fibrillation with RVR ([place]) 06/18/2015  . Acute on chronic diastolic CHF (congestive heart failure), NYHA class 3 ([place])   . Acute encephalopathy 06/14/2015  . Anemia, chronic disease 05/18/2015  . Hypothyroidism 05/18/2015  . Essential hypertension 05/18/2015  . Diabetes mellitus type 2, uncontrolled ([place]) 04/09/2015  . 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 IR FLUORO GUIDE PORT INSERTION RIGHT  07/26/2017  . IR US GUIDE VASC ACCESS RIGHT  07/26/2017  . MELANOMA EXCISION    . TUBAL LIGATION       OB History   None      Home Medications    Prior to Admission medications   Medication Sig Start Date End Date Taking? Authorizing Provider  acetaminophen (TYLENOL) 325 MG tablet Take 2 tablets (650 mg total) by mouth every 6 (six) hours as needed for fever. 09/19/15  Yes Theodis Blaze, MD  amLODipine (NORVASC) 2.5 MG tablet Take 2.5 mg by mouth daily.   Yes [provider]  Calcium-Magnesium-Vitamin D (CALCIUM 500 PO) Take 500 mg by mouth every morning.   Yes [provider]  carvedilol (COREG) 25 MG tablet Take 1 tablet (25 mg total) by mouth 2 (two) times daily. 09/19/15  Yes Theodis Blaze, MD  cholecalciferol (VITAMIN D) 400 units TABS tablet Take 400 Units by mouth  daily.   Yes [provider]  dexamethasone (DECADRON) 4 MG tablet Take 2 tablets (8 mg total) by mouth daily. Start the day after bendamustine chemotherapy for 2 days. Take with food. 07/24/17  Yes Ardath Sax, MD  diltiazem (CARDIZEM) 90 MG tablet Take 90 mg by mouth 3 (three) times daily. Midnight, 6am, noon, 6pm    Yes [provider]  ferrous sulfate 325 (65 FE) MG tablet Take 325 mg by mouth 2 (two) times daily with a meal.   Yes [provider]  GLUCERNA (GLUCERNA) LIQD Take 237 mLs  by mouth daily.    Yes [provider]  glucosamine-chondroitin 500-400 MG tablet Take 2 tablets by mouth daily.    Yes [provider]  hydrALAZINE (APRESOLINE) 100 MG tablet Take 100 mg by mouth 3 (three) times daily.   Yes [provider]  levothyroxine (SYNTHROID, LEVOTHROID) 112 MCG tablet Take 224 mcg by mouth daily before breakfast.    Yes [provider]  lidocaine-prilocaine (EMLA) cream Apply to affected area once 07/24/17  Yes Perlov, Marinell Blight, MD  lisinopril (PRINIVIL,ZESTRIL) 40 MG tablet Take 40 mg by mouth daily.    Yes [provider]  Multiple Vitamin (MULTIVITAMIN) tablet Take 1 tablet by mouth daily.   Yes [provider]  Omega-3 Fatty Acids (FISH OIL) 1000 MG CAPS Take 4,000 mg by mouth daily.   Yes [provider]  torsemide (DEMADEX) 10 MG tablet Take 2 tablets (20 mg total) by mouth daily. Patient taking differently: Take 20 mg by mouth daily as needed (edema).  02/16/16  Yes Domenic Polite, MD  traMADol (ULTRAM) 50 MG tablet Take 50 mg by mouth 2 (two) times daily. 08/13/17  Yes [provider]  vitamin B-12 (CYANOCOBALAMIN) 500 MCG tablet Take 500 mcg by mouth daily.   Yes [provider]  zinc gluconate 50 MG tablet Take 50 mg by mouth daily.   Yes [provider]  ondansetron (ZOFRAN) 8 MG tablet Take 1 tablet (8 mg total) by mouth 2 (two) times daily as needed for refractory nausea / vomiting. Start on day 2 after bendamustine chemotherapy. 07/24/17   Ardath Sax, MD  prochlorperazine (COMPAZINE) 10 MG tablet Take 1 tablet (10 mg total) by mouth every 6 (six) hours as needed (Nausea or vomiting). 07/24/17   Ardath Sax, MD    Family History Family History  Problem Relation Age of Onset  . Cancer Sister   . Diabetes Mellitus II Mother   . Tuberculosis Father     Social History Social History   Tobacco Use  . Smoking status: Never Smoker  . Smokeless tobacco:  Never Used  Substance Use Topics  . Alcohol use: No  . Drug use: No     Allergies   Benadryl [diphenhydramine]; Ciprofloxacin; Furosemide; Nitrofurantoin monohyd macro; Septra [bactrim]; Sulfa drugs cross reactors; Allopurinol; and Ceclor [cefaclor]   Review of Systems Review of Systems  Constitutional: Positive for fever.  Respiratory: Negative for shortness of breath.   Cardiovascular: Positive for leg swelling. Negative for chest pain.  Gastrointestinal: Negative for abdominal pain, constipation, diarrhea, nausea and vomiting.  Genitourinary: Positive for difficulty urinating.  Musculoskeletal: Positive for back pain.  Skin: Negative for rash and wound.  Allergic/Immunologic: Positive for immunocompromised state.  Neurological: Negative for dizziness and weakness.  Hematological: Does not bruise/bleed easily.  Psychiatric/Behavioral: Negative for confusion.  All other systems reviewed and are negative.    Physical Exam Updated Vital Signs BP Marland Kitchen)  149/77   Pulse 90   Temp 98.9 F (37.2 C) (Rectal)   Resp 20   Ht 5\' 4"  (1.626 m)   Wt 82.1 kg (181 lb)   SpO2 95%   BMI 31.07 kg/m   Physical Exam  Constitutional: She is oriented to person, place, and time. She appears well-developed. No distress.  HENT:  Head: Normocephalic and atraumatic.  Eyes: Conjunctivae are normal.  Cardiovascular: Normal rate, regular rhythm and normal heart sounds.  No murmur heard. Pulmonary/Chest: Effort normal and breath sounds normal. No respiratory distress.  Abdominal: Soft. There is no tenderness.  Neurological: She is alert and oriented to person, place, and time.  Skin: Skin is warm and dry. No rash noted. She is not diaphoretic.  edema bilateral lower extremities to lower abdominal area, states chronic and improved since yesterday  Psychiatric: She has a normal mood and affect. Her behavior is normal.  Nursing note and vitals reviewed.    ED Treatments / Results  Labs (all  labs ordered are listed, but only abnormal results are displayed) Labs Reviewed  COMPREHENSIVE METABOLIC PANEL - Abnormal; Notable for the following components:      Result Value   Sodium 134 (*)    CO2 20 (*)    Glucose, Bld 199 (*)    BUN 41 (*)    Creatinine, Ser 1.61 (*)    Calcium 8.1 (*)    Total Protein 4.8 (*)    Albumin 2.3 (*)    GFR calc non Af Amer 31 (*)    GFR calc Af Amer 36 (*)    All other components within normal limits  CBC WITH DIFFERENTIAL/PLATELET - Abnormal; Notable for the following components:   RBC 2.84 (*)    Hemoglobin 8.5 (*)    HCT 24.9 (*)    Platelets 112 (*)    Lymphs Abs 0.1 (*)    All other components within normal limits  URINALYSIS, ROUTINE W REFLEX MICROSCOPIC - Abnormal; Notable for the following components:   APPearance HAZY (*)    Glucose, UA 50 (*)    Hgb urine dipstick MODERATE (*)    Protein, ur >=300 (*)    Leukocytes, UA LARGE (*)    RBC / HPF >50 (*)    WBC, UA >50 (*)    Bacteria, UA RARE (*)    All other components within normal limits  CULTURE, BLOOD (ROUTINE X 2)  CULTURE, BLOOD (ROUTINE X 2)  URINE CULTURE  PROTIME-INR  CK TOTAL AND CKMB (NOT AT [facility])  MAGNESIUM  I-STAT CG4 LACTIC ACID, ED    EKG None  Radiology Dg Chest 2 View  Result Date: 08/31/2017 CLINICAL DATA:  Fever. EXAM: CHEST - 2 VIEW COMPARISON:  Chest x-ray dated August 14, 2017. FINDINGS: Unchanged right chest wall port catheter with tip at the cavoatrial junction. Stable cardiomegaly. Normal pulmonary vascularity. Unchanged small left pleural effusion. No consolidation or pneumothorax. No acute osseous abnormality. IMPRESSION: 1. Unchanged small left pleural effusion. Electronically Signed   By: Titus Dubin M.D.   On: 08/31/2017 15:39   Ct Renal Stone Study  Result Date: 08/31/2017 CLINICAL DATA:  Right-sided flank pain. EXAM: CT ABDOMEN AND PELVIS WITHOUT CONTRAST TECHNIQUE: Multidetector CT imaging of the abdomen and pelvis was performed following  the standard protocol without IV contrast. COMPARISON:  07/22/2017 PET-CT FINDINGS: Lower chest: Small left and trace right pleural effusion. Moderate pericardial effusion, stable. Small hiatal hernia. Hepatobiliary: High-density liver without focal abnormality. The caudate is prominent but  the liver surface is not definitely nodular. Negative gallbladder and common bile duct. Pancreas: Unremarkable. Spleen: Chronic splenomegaly, recently evaluated by PET. AP span is 19 cm. Adrenals/Urinary Tract: Negative adrenals. No hydronephrosis or stone. Unremarkable bladder. Stomach/Bowel:  No obstruction. No inflammatory changes Vascular/Lymphatic: Atherosclerotic calcification. No mass or adenopathy. Reproductive:Negative Other: Moderate ascites that is non loculated, increased. There is marked anasarca, progressed. Small periumbilical hernia containing ascitic fluid. Musculoskeletal: No acute abnormalities. IMPRESSION: 1. Generalized volume overload. Marked anasarca and moderate ascites has progressed from 07/22/2017. Small left effusion and moderate pericardial effusion are similar to prior. 2. Chronic splenomegaly, reference PET CT last month. Electronically Signed   By: Monte Fantasia M.D.   On: 08/31/2017 18:55    Procedures Procedures (including critical care time)  Medications Ordered in ED Medications  piperacillin-tazobactam (ZOSYN) IVPB 3.375 g (has no administration in time range)  sodium chloride 0.9 % bolus 1,000 mL (0 mLs Intravenous Stopped 08/31/17 1648)  sodium chloride 0.9 % bolus 1,000 mL (0 mLs Intravenous Stopped 08/31/17 1822)     Initial Impression / Assessment and Plan / ED Course  I have reviewed the triage vital signs and the nursing notes.  Pertinent labs & imaging results that were available during my care of the patient were reviewed by me and considered in my medical decision making (see chart for details).  Clinical Course as of Sep 01 2015  Wed Aug 31, 4756  5921  72 year old female brought in today by EMS from home for fever and right lower back pain.  Patient has a history of malignant lymphoma, last had chemotherapy yesterday.  Patient reports temperature at home of 101.5, had Tylenol at home at 1155 today.  Per nurses report EMS reports fever 104.2 and given 1 g of Tylenol.  Patient reports she has had intermittent fevers for the past month, was given 1 dose of fosfomycin for UTI a few weeks ago.  States that she has swelling of her legs and abdominal area and was given a dose of Lasix while at chemo yesterday, states that the swelling has improved somewhat today however she is only voided twice prior to evaluation today.  On exam patient has edema in her lower extremities and lower abdominal area, redness noted to anterior lower legs bilaterally.  Her abdomen is soft and nontender, there is no palpable back tenderness.  Review of her lab work shows increasing creatinine and decrease in her GFR compared to yesterday's lab work, urinary tract infection, normal white count with unchanged anemia.  Urine cultures and blood cultures pending chest x-ray is unremarkable, CT abdomen pelvis without contrast shows anasarca, negative for pyelonephritis or kidney stones.  Patient has been seen by Dr. Darl Householder, discussed possible hospitalization due to fever today with urinary tract infection.   [LM]  2015 Case discussed with hospitalist who will see the patient.    [LM]    Clinical Course User Index [LM] Tacy Learn, PA-C    Final Clinical Impressions(s) / ED Diagnoses   Final diagnoses:  Fever, unspecified  Urinary tract infection in female  Patient on antineoplastic chemotherapy regimen    ED Discharge Orders    None       Roque Lias 08/31/17 2017    Drenda Freeze, MD 09/02/17 1505

## 2017-08-31 NOTE — ED Notes (Signed)
ED TO INPATIENT HANDOFF REPORT  Name/Age/Gender Emily Kennedy 72 y.o. female  Code Status    Code Status Orders  (From admission, onward)        Start     Ordered   08/31/17 2144  Do not attempt resuscitation (DNR)  Continuous    Question Answer Comment  In the event of cardiac or respiratory ARREST Do not call a "code blue"   In the event of cardiac or respiratory ARREST Do not perform Intubation, CPR, defibrillation or ACLS   In the event of cardiac or respiratory ARREST Use medication by any route, position, wound care, and other measures to relive pain and suffering. May use oxygen, suction and manual treatment of airway obstruction as needed for comfort.      08/31/17 2151    Code Status History    Date Active Date Inactive Code Status Order ID Comments User Context   02/14/2016 2017 02/16/2016 1902 Full Code [id number]  Domenic Polite, MD Inpatient   09/14/2015 2122 09/19/2015 1838 Full Code [id number]  Reubin Milan, MD Inpatient   06/23/2015 2309 07/20/2015 1755 Full Code [id number]  Corey Harold Inpatient   06/14/2015 0321 06/23/2015 2309 Full Code [id number]  Edwin Dada, MD ED   05/17/2015 2246 05/20/2015 1934 Full Code [id number]  Norval Morton, MD Inpatient   04/09/2015 1449 04/11/2015 1702 Full Code [id number]  Cristal Ford, DO Inpatient   04/09/2015 0557 04/09/2015 1449 DNR [id number]  Rise Patience, MD Inpatient      Home/SNF/Other Home  Chief Complaint back pain and swelling in leg  Level of Care/Admitting Diagnosis ED Disposition    ED Disposition Condition [facility]: [facility] [[id number]]  Level of Care: Telemetry [5]  Admit to tele based on following criteria: Complex arrhythmia (Bradycardia/Tachycardia)  Diagnosis: UTI (urinary tract infection) [888916]  Admitting Physician: Norval Morton [[id number]]  Attending Physician: Norval Morton [[id number]]  Estimated length of stay: past midnight  tomorrow  Certification:: I certify this patient will need inpatient services for at least 2 midnights  PT Class (Do Not Modify): Inpatient [101]  PT Acc Code (Do Not Modify): Private [1]       Medical History Past Medical History:  Diagnosis Date  . CHF (congestive heart failure) ([place])   . Diabetes mellitus   . Hypertension   . Thyroid disease   . Vasculitis ([facility])     Allergies Allergies  Allergen Reactions  . Benadryl [Diphenhydramine] Swelling  . Ciprofloxacin Hives  . Furosemide Itching, Swelling, Rash and Other (See Comments)    Made edema worse  . Nitrofurantoin Monohyd Macro Hives  . Septra [Bactrim] Hives  . Sulfa Drugs Cross Reactors Hives  . Allopurinol Itching  . Ceclor [Cefaclor] Rash    Breaks out in rash chest down.    IV Location/Drains/Wounds Patient Lines/Drains/Airways Status   Active Line/Drains/Airways    Name:   Placement date:   Placement time:   Site:   Days:   Implanted Port 07/26/17 Right Chest   07/26/17    1552    Chest   36          Labs/Imaging Results for orders placed or performed during the hospital encounter of 08/31/17 (from the past 48 hour(s))  Comprehensive metabolic panel     Status: Abnormal   Collection Time: 08/31/17  2:37 PM  Result Value Ref Range   Sodium 134 (L) 135 - 145 mmol/L   Potassium  4.6 3.5 - 5.1 mmol/L   Chloride 109 101 - 111 mmol/L   CO2 20 (L) 22 - 32 mmol/L   Glucose, Bld 199 (H) 65 - 99 mg/dL   BUN 41 (H) 6 - 20 mg/dL   Creatinine, Ser 1.61 (H) 0.44 - 1.00 mg/dL   Calcium 8.1 (L) 8.9 - 10.3 mg/dL   Total Protein 4.8 (L) 6.5 - 8.1 g/dL   Albumin 2.3 (L) 3.5 - 5.0 g/dL   AST 16 15 - 41 U/L   ALT 14 14 - 54 U/L   Alkaline Phosphatase 54 38 - 126 U/L   Total Bilirubin 0.6 0.3 - 1.2 mg/dL   GFR calc non Af Amer 31 (L) >60 mL/min   GFR calc Af Amer 36 (L) >60 mL/min    Comment: (NOTE) The eGFR has been calculated using the CKD EPI equation. This calculation has not been validated in all clinical  situations. eGFR's persistently <60 mL/min signify possible Chronic Kidney Disease.    Anion gap 5 5 - 15    Comment: Performed at [facility], [place] [address], [place], [place] [address]  CBC with Differential     Status: Abnormal   Collection Time: 08/31/17  2:37 PM  Result Value Ref Range   WBC 4.9 4.0 - 10.5 K/uL   RBC 2.84 (L) 3.87 - 5.11 MIL/uL   Hemoglobin 8.5 (L) 12.0 - 15.0 g/dL   HCT 24.9 (L) 36.0 - 46.0 %   MCV 87.7 78.0 - 100.0 fL   MCH 29.9 26.0 - 34.0 pg   MCHC 34.1 30.0 - 36.0 g/dL   RDW 13.6 11.5 - 15.5 %   Platelets 112 (L) 150 - 400 K/uL    Comment: REPEATED TO VERIFY SPECIMEN CHECKED FOR CLOTS PLATELET COUNT CONFIRMED BY SMEAR    Neutrophils Relative % 92 %   Lymphocytes Relative 3 %   Monocytes Relative 4 %   Eosinophils Relative 1 %   Basophils Relative 0 %   Neutro Abs 4.5 1.7 - 7.7 K/uL   Lymphs Abs 0.1 (L) 0.7 - 4.0 K/uL   Monocytes Absolute 0.2 0.1 - 1.0 K/uL   Eosinophils Absolute 0.1 0.0 - 0.7 K/uL   Basophils Absolute 0.0 0.0 - 0.1 K/uL   Smear Review PLATELET COUNT CONFIRMED BY SMEAR     Comment: Performed at [facility] [place] [address], [place], [place] [address]  CK total and CKMB (cardiac)not at [facility]     Status: None   Collection Time: 08/31/17  2:37 PM  Result Value Ref Range   Total CK 40 38 - 234 U/L   CK, MB 3.2 0.5 - 5.0 ng/mL   Relative Index RELATIVE INDEX IS INVALID 0.0 - 2.5    Comment: WHEN CK < 100 U/L        Performed at [place] [place] [address], [place], [place] [address]   Magnesium     Status: None   Collection Time: 08/31/17  2:37 PM  Result Value Ref Range   Magnesium 2.2 1.7 - 2.4 mg/dL    Comment: Performed at [facility], [place] [address] [place], [place] [address]  I-Stat CG4 Lactic Acid, ED     Status: None   Collection Time: 08/31/17  2:51 PM  Result Value Ref Range   Lactic Acid, Venous 1.05 0.5 - 1.9 mmol/L  Protime-INR     Status: None    Collection Time: 08/31/17  3:11 PM  Result Value Ref Range   Prothrombin Time 13.7 11.4 - 15.2 seconds   INR 1.06     Comment: Performed at [facility], [place] [address], [place], [place] [address]  Urinalysis, Routine w reflex microscopic     Status: Abnormal   Collection Time: 08/31/17  5:36 PM  Result Value Ref Range   Color, Urine YELLOW YELLOW   APPearance HAZY (A) CLEAR   Specific Gravity, Urine 1.014 1.005 - 1.030   pH 5.0 5.0 - 8.0   Glucose, UA 50 (A) NEGATIVE mg/dL   Hgb urine dipstick MODERATE (A) NEGATIVE   Bilirubin Urine NEGATIVE NEGATIVE   Ketones, ur NEGATIVE NEGATIVE mg/dL   Protein, ur >=300 (A) NEGATIVE mg/dL   Nitrite NEGATIVE NEGATIVE   Leukocytes, UA LARGE (A) NEGATIVE   RBC / HPF >50 (H) 0 - 5 RBC/hpf   WBC, UA >50 (H) 0 - 5 WBC/hpf   Bacteria, UA RARE (A) NONE SEEN   Squamous Epithelial / LPF 0-5 0 - 5   Mucus PRESENT     Comment: Performed at [facility], [place] [address], [place], [place] [address]   Dg Chest 2 View  Result Date: 08/31/2017 CLINICAL DATA:  Fever. EXAM: CHEST - 2 VIEW COMPARISON:  Chest x-ray dated August 14, 2017. FINDINGS: Unchanged right chest wall port catheter with tip at the cavoatrial junction. Stable cardiomegaly. Normal pulmonary vascularity. Unchanged small left pleural effusion. No consolidation or pneumothorax. No acute osseous abnormality. IMPRESSION: 1. Unchanged small left pleural effusion. Electronically Signed   By: Titus Dubin M.D.   On: 08/31/2017 15:39   Ct Renal Stone Study  Result Date: 08/31/2017 CLINICAL DATA:  Right-sided flank pain. EXAM: CT ABDOMEN AND PELVIS WITHOUT CONTRAST TECHNIQUE: Multidetector CT imaging of the abdomen and pelvis was performed following the standard protocol without IV contrast. COMPARISON:  07/22/2017 PET-CT FINDINGS: Lower chest: Small left and trace right pleural effusion. Moderate pericardial effusion, stable. Small hiatal hernia. Hepatobiliary:  High-density liver without focal abnormality. The caudate is prominent but the liver surface is not definitely nodular. Negative gallbladder and common bile duct. Pancreas: Unremarkable. Spleen: Chronic splenomegaly, recently evaluated by PET. AP span is 19 cm. Adrenals/Urinary Tract: Negative adrenals. No hydronephrosis or stone. Unremarkable bladder. Stomach/Bowel:  No obstruction. No inflammatory changes Vascular/Lymphatic: Atherosclerotic calcification. No mass or adenopathy. Reproductive:Negative Other: Moderate ascites that is non loculated, increased. There is marked anasarca, progressed. Small periumbilical hernia containing ascitic fluid. Musculoskeletal: No acute abnormalities. IMPRESSION: 1. Generalized volume overload. Marked anasarca and moderate ascites has progressed from 07/22/2017. Small left effusion and moderate pericardial effusion are similar to prior. 2. Chronic splenomegaly, reference PET CT last month. Electronically Signed   By: Monte Fantasia M.D.   On: 08/31/2017 18:55    Pending Labs Unresulted Labs (From admission, onward)   Start     Ordered   09/01/17 0500  CBC  Tomorrow morning,   R     08/31/17 2151   09/01/17 1275  Basic metabolic panel  Tomorrow morning,   R     08/31/17 2151   09/01/17 0500  Prealbumin  Tomorrow morning,   R     08/31/17 2151   08/31/17 2141  TSH  Add-on,   R     08/31/17 2151   08/31/17 2139  CK  Add-on,   R     08/31/17 2151   08/31/17 1759  Urine culture  STAT,   STAT     08/31/17 1758  08/31/17 1437  Culture, blood (Routine x 2)  BLOOD CULTURE X 2,   STAT     08/31/17 1440      Vitals/Pain Today's Vitals   08/31/17 2030 08/31/17 2100 08/31/17 2130 08/31/17 2200  BP: (!) 157/80 (!) 164/86 (!) 165/90 (!) 161/88  Pulse: 89 87 86 75  Resp: (!) 21 20 20  (!) 22  Temp:      TempSrc:      SpO2: 96% 96% 97% 97%  Weight:      Height:        Isolation Precautions No active isolations  Medications Medications  amLODipine  (NORVASC) tablet 2.5 mg (has no administration in time range)  carvedilol (COREG) tablet 25 mg (has no administration in time range)  ferrous sulfate tablet 325 mg (has no administration in time range)  diltiazem (CARDIZEM) tablet 90 mg (has no administration in time range)  dexamethasone (DECADRON) tablet 8 mg (has no administration in time range)  GLUCERNA liquid 237 mL (has no administration in time range)  levothyroxine (SYNTHROID, LEVOTHROID) tablet 224 mcg (has no administration in time range)  lidocaine-prilocaine (EMLA) cream (has no administration in time range)  traMADol (ULTRAM) tablet 50 mg (has no administration in time range)  ondansetron (ZOFRAN) tablet 4 mg (has no administration in time range)    Or  ondansetron (ZOFRAN) injection 4 mg (has no administration in time range)  acetaminophen (TYLENOL) tablet 650 mg (has no administration in time range)    Or  acetaminophen (TYLENOL) suppository 650 mg (has no administration in time range)  sodium chloride 0.9 % bolus 1,000 mL (0 mLs Intravenous Stopped 08/31/17 1648)  sodium chloride 0.9 % bolus 1,000 mL (0 mLs Intravenous Stopped 08/31/17 1822)  piperacillin-tazobactam (ZOSYN) IVPB 3.375 g (0 g Intravenous Stopped 08/31/17 2020)    Mobility non-ambulatory

## 2017-08-31 NOTE — ED Notes (Signed)
No urine noted in pure wick at this time  

## 2017-08-31 NOTE — Progress Notes (Signed)
Per Dr Irene Limbo no tx today. Pt understands and will be back next scheduled appt.

## 2017-09-01 ENCOUNTER — Other Ambulatory Visit: Payer: Self-pay

## 2017-09-01 ENCOUNTER — Inpatient Hospital Stay (HOSPITAL_COMMUNITY): Payer: Medicare Other

## 2017-09-01 DIAGNOSIS — Z881 Allergy status to other antibiotic agents status: Secondary | ICD-10-CM

## 2017-09-01 DIAGNOSIS — C8590 Non-Hodgkin lymphoma, unspecified, unspecified site: Secondary | ICD-10-CM | POA: Diagnosis present

## 2017-09-01 DIAGNOSIS — D638 Anemia in other chronic diseases classified elsewhere: Secondary | ICD-10-CM

## 2017-09-01 DIAGNOSIS — R601 Generalized edema: Secondary | ICD-10-CM | POA: Diagnosis present

## 2017-09-01 DIAGNOSIS — Z882 Allergy status to sulfonamides status: Secondary | ICD-10-CM

## 2017-09-01 DIAGNOSIS — N3000 Acute cystitis without hematuria: Secondary | ICD-10-CM

## 2017-09-01 DIAGNOSIS — Z9981 Dependence on supplemental oxygen: Secondary | ICD-10-CM

## 2017-09-01 DIAGNOSIS — D469 Myelodysplastic syndrome, unspecified: Secondary | ICD-10-CM

## 2017-09-01 DIAGNOSIS — J9601 Acute respiratory failure with hypoxia: Secondary | ICD-10-CM

## 2017-09-01 DIAGNOSIS — D696 Thrombocytopenia, unspecified: Secondary | ICD-10-CM | POA: Diagnosis present

## 2017-09-01 DIAGNOSIS — C8300 Small cell B-cell lymphoma, unspecified site: Secondary | ICD-10-CM

## 2017-09-01 DIAGNOSIS — A419 Sepsis, unspecified organism: Secondary | ICD-10-CM | POA: Diagnosis present

## 2017-09-01 DIAGNOSIS — Z888 Allergy status to other drugs, medicaments and biological substances status: Secondary | ICD-10-CM

## 2017-09-01 DIAGNOSIS — N390 Urinary tract infection, site not specified: Principal | ICD-10-CM

## 2017-09-01 DIAGNOSIS — D72819 Decreased white blood cell count, unspecified: Secondary | ICD-10-CM

## 2017-09-01 DIAGNOSIS — Z809 Family history of malignant neoplasm, unspecified: Secondary | ICD-10-CM

## 2017-09-01 DIAGNOSIS — Z87440 Personal history of urinary (tract) infections: Secondary | ICD-10-CM

## 2017-09-01 LAB — ABO/RH: ABO/RH(D): B POS

## 2017-09-01 LAB — CBC
HCT: 22.9 % — ABNORMAL LOW (ref 36.0–46.0)
HEMOGLOBIN: 7.5 g/dL — AB (ref 12.0–15.0)
MCH: 28.3 pg (ref 26.0–34.0)
MCHC: 32.8 g/dL (ref 30.0–36.0)
MCV: 86.4 fL (ref 78.0–100.0)
Platelets: 81 10*3/uL — ABNORMAL LOW (ref 150–400)
RBC: 2.65 MIL/uL — ABNORMAL LOW (ref 3.87–5.11)
RDW: 13.5 % (ref 11.5–15.5)
WBC: 2.5 10*3/uL — ABNORMAL LOW (ref 4.0–10.5)

## 2017-09-01 LAB — PREALBUMIN: PREALBUMIN: 20.9 mg/dL (ref 18–38)

## 2017-09-01 LAB — BASIC METABOLIC PANEL
Anion gap: 5 (ref 5–15)
BUN: 41 mg/dL — ABNORMAL HIGH (ref 6–20)
CALCIUM: 7.9 mg/dL — AB (ref 8.9–10.3)
CO2: 20 mmol/L — ABNORMAL LOW (ref 22–32)
CREATININE: 1.6 mg/dL — AB (ref 0.44–1.00)
Chloride: 109 mmol/L (ref 101–111)
GFR, EST AFRICAN AMERICAN: 37 mL/min — AB (ref >60)
GFR, EST NON AFRICAN AMERICAN: 32 mL/min — AB (ref >60)
Glucose, Bld: 256 mg/dL — ABNORMAL HIGH (ref 65–99)
Potassium: 5.1 mmol/L (ref 3.5–5.1)
SODIUM: 134 mmol/L — AB (ref 135–145)

## 2017-09-01 LAB — MRSA PCR SCREENING: MRSA by PCR: NEGATIVE

## 2017-09-01 LAB — GLUCOSE, CAPILLARY
GLUCOSE-CAPILLARY: 205 mg/dL — AB (ref 65–99)
GLUCOSE-CAPILLARY: 149 mg/dL — AB (ref 65–99)
Glucose-Capillary: 261 mg/dL — ABNORMAL HIGH (ref 65–99)
Glucose-Capillary: 184 mg/dL — ABNORMAL HIGH (ref 65–99)
Glucose-Capillary: 116 mg/dL — ABNORMAL HIGH (ref 65–99)

## 2017-09-01 LAB — PREPARE RBC (CROSSMATCH)

## 2017-09-01 MED ORDER — INSULIN ASPART 100 UNIT/ML ~~LOC~~ SOLN
0.0000 [IU] | Freq: Three times a day (TID) | SUBCUTANEOUS | Status: DC
  Administered 2017-09-01: 3 [IU] via SUBCUTANEOUS
  Administered 2017-09-01 – 2017-09-03 (×3): 2 [IU] via SUBCUTANEOUS
  Administered 2017-09-03 – 2017-09-04 (×4): 1 [IU] via SUBCUTANEOUS
  Administered 2017-09-04: 2 [IU] via SUBCUTANEOUS
  Administered 2017-09-05: 1 [IU] via SUBCUTANEOUS
  Administered 2017-09-05: 2 [IU] via SUBCUTANEOUS
  Administered 2017-09-05: 1 [IU] via SUBCUTANEOUS
  Administered 2017-09-06: 2 [IU] via SUBCUTANEOUS
  Administered 2017-09-06: 1 [IU] via SUBCUTANEOUS
  Administered 2017-09-06: 2 [IU] via SUBCUTANEOUS

## 2017-09-01 MED ORDER — BISACODYL 10 MG RE SUPP
10.0000 mg | Freq: Once | RECTAL | Status: AC
  Administered 2017-09-01: 10 mg via RECTAL

## 2017-09-01 MED ORDER — SODIUM CHLORIDE 0.9 % IV SOLN
1.5000 g | Freq: Four times a day (QID) | INTRAVENOUS | Status: DC
  Administered 2017-09-01 – 2017-09-05 (×15): 1.5 g via INTRAVENOUS
  Filled 2017-09-01 (×18): qty 1.5

## 2017-09-01 MED ORDER — SODIUM CHLORIDE 0.9% IV SOLUTION
Freq: Once | INTRAVENOUS | Status: AC
  Administered 2017-09-01: 16:00:00 via INTRAVENOUS

## 2017-09-01 MED ORDER — FAMOTIDINE 20 MG PO TABS
20.0000 mg | ORAL_TABLET | Freq: Two times a day (BID) | ORAL | Status: DC
  Administered 2017-09-01 – 2017-09-06 (×11): 20 mg via ORAL
  Filled 2017-09-01 (×11): qty 1

## 2017-09-01 MED ORDER — FUROSEMIDE 10 MG/ML IJ SOLN
INTRAMUSCULAR | Status: AC
  Filled 2017-09-01: qty 4

## 2017-09-01 MED ORDER — IPRATROPIUM-ALBUTEROL 0.5-2.5 (3) MG/3ML IN SOLN
3.0000 mL | Freq: Three times a day (TID) | RESPIRATORY_TRACT | Status: DC
  Administered 2017-09-02 – 2017-09-04 (×8): 3 mL via RESPIRATORY_TRACT
  Filled 2017-09-01 (×9): qty 3

## 2017-09-01 MED ORDER — ALBUTEROL SULFATE (2.5 MG/3ML) 0.083% IN NEBU
2.5000 mg | INHALATION_SOLUTION | RESPIRATORY_TRACT | Status: DC | PRN
  Administered 2017-09-05: 2.5 mg via RESPIRATORY_TRACT
  Filled 2017-09-01: qty 3

## 2017-09-01 MED ORDER — FLUTICASONE PROPIONATE 50 MCG/ACT NA SUSP
1.0000 | Freq: Every day | NASAL | Status: DC
  Administered 2017-09-01 – 2017-09-06 (×6): 1 via NASAL
  Filled 2017-09-01: qty 16

## 2017-09-01 MED ORDER — LABETALOL HCL 5 MG/ML IV SOLN: 10.0000 mg | INTRAVENOUS | Status: DC | PRN

## 2017-09-01 MED ORDER — PIPERACILLIN-TAZOBACTAM 3.375 G IVPB
3.3750 g | Freq: Three times a day (TID) | INTRAVENOUS | Status: DC
Start: 2017-09-01 — End: 2017-09-01
  Administered 2017-09-01 (×2): 3.375 g via INTRAVENOUS
  Filled 2017-09-01 (×2): qty 50

## 2017-09-01 MED ORDER — HYDRALAZINE HCL 20 MG/ML IJ SOLN
INTRAMUSCULAR | Status: AC
  Administered 2017-09-01: 16:00:00
  Filled 2017-09-01: qty 1

## 2017-09-01 MED ORDER — CLONIDINE HCL 0.1 MG PO TABS
0.2000 mg | ORAL_TABLET | Freq: Once | ORAL | Status: AC
  Administered 2017-09-01: 0.2 mg via ORAL
  Filled 2017-09-01: qty 2

## 2017-09-01 MED ORDER — AMLODIPINE BESYLATE 10 MG PO TABS
10.0000 mg | ORAL_TABLET | Freq: Every day | ORAL | Status: DC
  Administered 2017-09-02 – 2017-09-06 (×5): 10 mg via ORAL
  Filled 2017-09-01 (×5): qty 1

## 2017-09-01 MED ORDER — LABETALOL HCL 5 MG/ML IV SOLN
10.0000 mg | INTRAVENOUS | Status: DC | PRN
  Administered 2017-09-01: 10 mg via INTRAVENOUS
  Filled 2017-09-01 (×2): qty 4

## 2017-09-01 MED ORDER — IPRATROPIUM-ALBUTEROL 0.5-2.5 (3) MG/3ML IN SOLN
3.0000 mL | Freq: Four times a day (QID) | RESPIRATORY_TRACT | Status: DC
  Administered 2017-09-01: 3 mL via RESPIRATORY_TRACT
  Filled 2017-09-01: qty 3

## 2017-09-01 MED ORDER — HYDRALAZINE HCL 20 MG/ML IJ SOLN
10.0000 mg | Freq: Four times a day (QID) | INTRAMUSCULAR | Status: DC | PRN
  Administered 2017-09-01 – 2017-09-02 (×2): 10 mg via INTRAVENOUS
  Filled 2017-09-01 (×2): qty 1

## 2017-09-01 MED ORDER — FUROSEMIDE 10 MG/ML IJ SOLN
40.0000 mg | Freq: Once | INTRAMUSCULAR | Status: AC
  Administered 2017-09-01: 40 mg via INTRAVENOUS
  Filled 2017-09-01: qty 4

## 2017-09-01 MED ORDER — HYDRALAZINE HCL 20 MG/ML IJ SOLN
10.0000 mg | INTRAMUSCULAR | Status: AC
  Administered 2017-09-01: 10 mg via INTRAVENOUS

## 2017-09-01 NOTE — Progress Notes (Signed)
PROGRESS NOTE    Emily Kennedy  ACZ:[id number] DOB: 1945-10-19 DOA: 08/31/2017 PCP: [name], MD  Brief Narrative:72 y.o. female with medical history significant of HTN, HLD, CHF, DM type II, vasculitis, and malignant lymphoma currently receiving chemotherapy followed by [name] of oncology; who presents with complaints of fever.  History is mostly obtained from the patient's daughter as patient defers to her arm.  Over the last several weeks patient has had abdominal and bilateral leg swelling, but in the last week had progressively worsened to the point which patient was having difficulty ambulating.  She was seen at the cancer center yesterday where she received chemotherapy of bendamustine and rituximab.  Patient was also given Lasix 40 mg IV due to swelling.  She had not been taking torsemide.  Since yesterday the patient reports only having urinated once since yesterday.  Today patient reported acute onset of sharp bilateral flank pain(worse on the right flank) and abdominal pain.  Temperature at home was noted to be up to around 101.5 F. Patient tried taking Tylenol and tramadol without relief of symptoms around noon.  In addition to the leg swelling she noticed some mild redness of the legs and generalized malaise.  Denies any significant shortness of breath, cough, chest pain, nausea, vomiting, dysuria, or diarrhea. Per review of records it appears en route with EMS patient was noted to be febrile up to 104.2 F and was given 1000 mg of Tylenol prior to arrival.    ED Course: Upon admission into the emergency department patient was seen to be febrile up to 100.4 F, pulse 85-98, respirations 17-25, blood pressures up to 185/90, and O2 saturation 92-99% on room air.  Labs revealed WBC 4.9, globin 8.5, platelets 112, sodium 134, BUN 41, creatinine 1.61, albumin 2.3, lactic acid 1.05, and INR 1.06.  Urinalysis was positive for signs of possible infection and blood.  CT scan of the abdomen showed  moderate ascites with generalized anasarca and small left pleural and pericardial effusion similar to previous studies.   Patient was given empiric antibiotics of Zosyn and 2 L normal saline IV fluids    Assessment & Plan:   Principal Problem:   UTI (urinary tract infection) Active Problems:   Anemia, chronic disease   Essential hypertension   Chronic diastolic CHF (congestive heart failure) (HCC)   Controlled diabetes mellitus type 2 with complications (HCC)   Anasarca   Thrombocytopenia (HCC)   Malignant lymphoma (HCC)   Fever  1]UTI-patient presented with fever and abdominal pain found to have UTI.  Empirically started on Zosyn due to immunocompromise status.  Urine culture pending . de Escalate antibiotics once the culture and sensitivities back.  2] mild lower extremity cellulitis due to swelling continue Zosyn for now.  3] malignant lymphoma patient received chemotherapy 6/18 under the care of [name].  Sending message to [name] about the patient's admission.  4] AKI patient admitted with a creatinine 1.61 down to 1.60 after IV hydration.  Baseline creatinine 1.2-1.4.  However I am hesitant to give her any more fluids due to anasarca pleural effusion pericardial effusion and lower extremity edema.  5] systolic CHF with ejection fraction 60 to 65% by an echo 05/2015.  Will recheck echo to evaluate ejection fraction.  Patient does have extravascular fluid retention.  We will monitor closely for now without any intervention at this time.  Is also having low albumin.  6] anemia of chronic disease -Hb dropped ?hemo dilutuion s/p 2 lit..will arrange  blood transfusion.  7] diet-controlled diabetes no recent hemoglobin A1c we will check a hemoglobin A1c continue SSI and CBG s.  8]hypothyroidsm-check free t4.tsh elevated.    DVT prophylaxis:scd Code Status:dnr Family Communication none  Disposition Plan:tbd Consultants:  none Procedures:none Antimicrobials  zosyn Subjective:resting in bed   Objective: Vitals:   08/31/17 2200 08/31/17 2254 09/01/17 0056 09/01/17 0449  BP: (!) 161/88 (!) 172/94 (!) 163/84 139/67  Pulse: 75 90 84 72  Resp: (!) 22 12 12 12   Temp:  97.9 F (36.6 C) 98 F (36.7 C) 98.3 F (36.8 C)  TempSrc:  Oral  Oral  SpO2: 97% 98% 99% 95%  Weight:  84.8 kg (186 lb 15.2 oz)  85.5 kg (188 lb 7.9 oz)  Height:  5\' 4"  (1.626 m)      Intake/Output Summary (Last 24 hours) at 09/01/2017 1137 Last data filed at 09/01/2017 0829 Gross per 24 hour  Intake 2330.21 ml  Output 500 ml  Net 1830.21 ml   Filed Weights   08/31/17 1425 08/31/17 2254 09/01/17 0449  Weight: 82.1 kg (181 lb) 84.8 kg (186 lb 15.2 oz) 85.5 kg (188 lb 7.9 oz)    Examination:  General exam: Appears calm and comfortable  Respiratory system: Clear to auscultation. Respiratory effort normal. Cardiovascular system: S1 & S2 heard, RRR. No JVD, murmurs, rubs, gallops or clicks. No pedal edema. Gastrointestinal system: Abdomen is DISTENDED , soft and nontender. No organomegaly or masses felt. Normal bowel sounds heard. Central nervous system: Alert and oriented. No focal neurological deficits. Extremities: 2 PLUS EDEMA Skin: No rashes, lesions or ulcers Psychiatry: Judgement and insight appear normal. Mood & affect appropriate.     Data Reviewed: I have personally reviewed following labs and imaging studies  CBC: Recent Labs  Lab 08/30/17 0852 08/31/17 1437 09/01/17 0407  WBC 3.7* 4.9 2.5*  NEUTROABS 3.2 4.5  --   HGB 8.7* 8.5* 7.5*  HCT 26.6* 24.9* 22.9*  MCV 86.1 87.7 86.4  PLT 97* 112* 81*   Basic Metabolic Panel: Recent Labs  Lab 08/30/17 0852 08/31/17 1437 09/01/17 0407  NA 134* 134* 134*  K 4.4 4.6 5.1  CL 108 109 109  CO2 21* 20* 20*  GLUCOSE 141* 199* 256*  BUN 33* 41* 41*  CREATININE 1.48* 1.61* 1.60*  CALCIUM 8.2* 8.1* 7.9*  MG 2.1 2.2  --   PHOS 3.5  --   --    GFR: Estimated Creatinine Clearance: 34.6 mL/min (A) (by  C-G formula based on SCr of 1.6 mg/dL (H)). Liver Function Tests: Recent Labs  Lab 08/30/17 0852 08/31/17 1437  AST 13 16  ALT 8 14  ALKPHOS 64 54  BILITOT <0.2* 0.6  PROT 4.9* 4.8*  ALBUMIN 2.1* 2.3*   No results for input(s): LIPASE, AMYLASE in the last 168 hours. No results for input(s): AMMONIA in the last 168 hours. Coagulation Profile: Recent Labs  Lab 08/31/17 1511  INR 1.06   Cardiac Enzymes: Recent Labs  Lab 08/31/17 1437 08/31/17 2139  CKTOTAL 40 42  CKMB 3.2  --    BNP (last 3 results) No results for input(s): PROBNP in the last 8760 hours. HbA1C: No results for input(s): HGBA1C in the last 72 hours. CBG: Recent Labs  Lab 09/01/17 0116 09/01/17 0742 09/01/17 1126  GLUCAP 261* 205* 184*   Lipid Profile: No results for input(s): CHOL, HDL, LDLCALC, TRIG, CHOLHDL, LDLDIRECT in the last 72 hours. Thyroid Function Tests: Recent Labs    08/31/17 2141  TSH  13.079*   Anemia Panel: No results for input(s): VITAMINB12, FOLATE, FERRITIN, TIBC, IRON, RETICCTPCT in the last 72 hours. Sepsis Labs: Recent Labs  Lab 08/31/17 1451  LATICACIDVEN 1.05    No results found for this or any previous visit (from the past 240 hour(s)).       Radiology Studies: Dg Chest 2 View  Result Date: 08/31/2017 CLINICAL DATA:  Fever. EXAM: CHEST - 2 VIEW COMPARISON:  Chest x-ray dated August 14, 2017. FINDINGS: Unchanged right chest wall port catheter with tip at the cavoatrial junction. Stable cardiomegaly. Normal pulmonary vascularity. Unchanged small left pleural effusion. No consolidation or pneumothorax. No acute osseous abnormality. IMPRESSION: 1. Unchanged small left pleural effusion. Electronically Signed   By: Titus Dubin M.D.   On: 08/31/2017 15:39   Ct Renal Stone Study  Result Date: 08/31/2017 CLINICAL DATA:  Right-sided flank pain. EXAM: CT ABDOMEN AND PELVIS WITHOUT CONTRAST TECHNIQUE: Multidetector CT imaging of the abdomen and pelvis was performed  following the standard protocol without IV contrast. COMPARISON:  07/22/2017 PET-CT FINDINGS: Lower chest: Small left and trace right pleural effusion. Moderate pericardial effusion, stable. Small hiatal hernia. Hepatobiliary: High-density liver without focal abnormality. The caudate is prominent but the liver surface is not definitely nodular. Negative gallbladder and common bile duct. Pancreas: Unremarkable. Spleen: Chronic splenomegaly, recently evaluated by PET. AP span is 19 cm. Adrenals/Urinary Tract: Negative adrenals. No hydronephrosis or stone. Unremarkable bladder. Stomach/Bowel:  No obstruction. No inflammatory changes Vascular/Lymphatic: Atherosclerotic calcification. No mass or adenopathy. Reproductive:Negative Other: Moderate ascites that is non loculated, increased. There is marked anasarca, progressed. Small periumbilical hernia containing ascitic fluid. Musculoskeletal: No acute abnormalities. IMPRESSION: 1. Generalized volume overload. Marked anasarca and moderate ascites has progressed from 07/22/2017. Small left effusion and moderate pericardial effusion are similar to prior. 2. Chronic splenomegaly, reference PET CT last month. Electronically Signed   By: Monte Fantasia M.D.   On: 08/31/2017 18:55        Scheduled Meds: . amLODipine  2.5 mg Oral Daily  . carvedilol  25 mg Oral BID  . chlorhexidine  15 mL Mouth Rinse BID  . diltiazem  90 mg Oral Q8H  . feeding supplement (GLUCERNA SHAKE)  237 mL Oral TID BM  . ferrous sulfate  325 mg Oral BID WC  . fluticasone  1 spray Each Nare Daily  . insulin aspart  0-9 Units Subcutaneous TID WC  . levothyroxine  224 mcg Oral QAC breakfast  . mouth rinse  15 mL Mouth Rinse q12n4p   Continuous Infusions: . piperacillin-tazobactam (ZOSYN)  IV Stopped (09/01/17 6283)     LOS: 1 day     Georgette Shell, MD [facility] If 7PM-7AM, please contact night-coverage [email or web address] Password [facility] 09/01/2017, 11:37 AM

## 2017-09-01 NOTE — Consult Note (Signed)
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  She has had progressively worsening functional capacity, difficulty ambulating.  She was admitted 6/19 with fever, bilateral flank pain, acute renal insufficiency.  Urinalysis consistent with an acute urinary tract infection.  She was treated empirically with Zosyn.  A CT of her abdomen showed volume overload, anasarca with ascites, a small left effusion and no evidence of ureteral obstruction.    This afternoon she experienced emesis and then quickly developed dyspnea, hypoxemia.  She was placed on high flow oxygen.  Chest x-ray was performed and I have reviewed.  This shows evidence for evolving right greater than left interstitial infiltrates, chronic left lower lobe atelectasis versus consolidation and small effusions.  She was moved to the stepdown unit given her progressive dyspnea and hypoxemia.    PAST MEDICAL HISTORY :  She  has a past medical history of CHF (congestive heart failure) ([place]), Diabetes mellitus, Hypertension, Thyroid disease, and Vasculitis ([place]).  PAST SURGICAL HISTORY: She  has a past surgical history that includes Tubal ligation; Melanoma excision; IR US Guide Vasc Access Right (07/26/2017); and IR FLUORO GUIDE PORT INSERTION RIGHT (07/26/2017).  Allergies  Allergen Reactions  . Benadryl [Diphenhydramine] Swelling  . Ciprofloxacin Hives  . Furosemide Itching, Swelling, Rash and Other (See Comments)    Made edema worse  . Nitrofurantoin Monohyd Macro Hives  . Septra [Bactrim]  Hives  . Sulfa Drugs Cross Reactors Hives  . Allopurinol Itching  . Ceclor [Cefaclor] Rash    Breaks out in rash chest down.    No current facility-administered medications on file prior to encounter.    Current Outpatient Medications on File Prior to Encounter  Medication Sig  . acetaminophen (TYLENOL) 325 MG tablet Take 2 tablets (650 mg total) by mouth every 6 (six) hours as needed for fever.  Marland Kitchen amLODipine (NORVASC) 2.5 MG tablet Take 2.5 mg by mouth daily.  . Calcium-Magnesium-Vitamin D (CALCIUM 500 PO) Take 500 mg by mouth every morning.  . carvedilol (COREG) 25 MG tablet Take 1 tablet (25 mg total) by mouth 2 (two) times daily.  . cholecalciferol (VITAMIN D) 400 units TABS tablet Take 400 Units by mouth daily.  Marland Kitchen dexamethasone (DECADRON) 4 MG tablet Take 2 tablets (8 mg total) by mouth daily. Start the day after bendamustine chemotherapy for 2 days. Take with food.  . diltiazem (CARDIZEM) 90 MG tablet Take 90 mg by mouth 3 (three) times daily. Midnight, 6am, noon, 6pm   . ferrous sulfate 325 (65 FE) MG tablet Take 325 mg by mouth 2 (two) times daily with a meal.  . GLUCERNA (GLUCERNA) LIQD Take 237 mLs by mouth daily.   Marland Kitchen glucosamine-chondroitin 500-400 MG tablet Take 2 tablets by mouth daily.   . hydrALAZINE (APRESOLINE) 100 MG tablet Take 100 mg by mouth 3 (three) times daily.  Marland Kitchen levothyroxine (SYNTHROID, LEVOTHROID) 112 MCG tablet Take 224 mcg by mouth daily before breakfast.   . lidocaine-prilocaine (EMLA) cream Apply to affected area once  . lisinopril (PRINIVIL,ZESTRIL) 40 MG tablet Take 40 mg by mouth daily.   . Multiple Vitamin (MULTIVITAMIN) tablet Take 1 tablet  by mouth daily.  . Omega-3 Fatty Acids (FISH OIL) 1000 MG CAPS Take 4,000 mg by mouth daily.  Marland Kitchen torsemide (DEMADEX) 10 MG tablet Take 2 tablets (20 mg total) by mouth daily. (Patient taking differently: Take 20 mg by mouth daily as needed (edema). )  . traMADol (ULTRAM) 50 MG tablet Take 50 mg by mouth 2 (two) times  daily.  . vitamin B-12 (CYANOCOBALAMIN) 500 MCG tablet Take 500 mcg by mouth daily.  Marland Kitchen zinc gluconate 50 MG tablet Take 50 mg by mouth daily.  . ondansetron (ZOFRAN) 8 MG tablet Take 1 tablet (8 mg total) by mouth 2 (two) times daily as needed for refractory nausea / vomiting. Start on day 2 after bendamustine chemotherapy.  . prochlorperazine (COMPAZINE) 10 MG tablet Take 1 tablet (10 mg total) by mouth every 6 (six) hours as needed (Nausea or vomiting).    FAMILY HISTORY:  Her indicated that the status of her mother is unknown. She indicated that the status of her father is unknown. She indicated that her sister is deceased.   SOCIAL HISTORY: She  reports that she has never smoked. She has never used smokeless tobacco. She reports that she does not drink alcohol or use drugs.  REVIEW OF SYSTEMS:   Complains of emesis, sinus drainage.  She has some dry cough  SUBJECTIVE:  States that her dyspnea is now improved.  She is wearing 100% mask and is satting 100%  VITAL SIGNS: BP (!) 153/99   Pulse 83   Temp (!) 101.2 F (38.4 C) (Rectal)   Resp (!) 25   Ht 5\' 4"  (1.626 m)   Wt 85.5 kg (188 lb 7.9 oz)   SpO2 (!) 80%   BMI 32.35 kg/m   HEMODYNAMICS:    VENTILATOR SETTINGS: FiO2 (%):  [55 %] 55 %  INTAKE / OUTPUT: I/O last 3 completed shifts: In: 2330.2 [P.O.:240; IV Piggyback:2090.2] Out: 450 [Urine:450]  PHYSICAL EXAMINATION: General: Ill-appearing thin woman, no distress with her percent mask in place Neuro: Awake, alert, interacting, follows commands HEENT: Oropharynx clear, edentulous, slight dysarthria, no stridor Cardiovascular: Regular, distant, no murmur, trace lower extremity edema Lungs: Coarse bilateral breath sounds more so on the right than on the left with some inspiratory crackles.  No wheezing Abdomen: Soft, nontender with positive bowel sounds Musculoskeletal: No deformities Skin: No rash  LABS:  BMET Recent Labs  Lab 08/30/17 0852 08/31/17 1437  09/01/17 0407  NA 134* 134* 134*  K 4.4 4.6 5.1  CL 108 109 109  CO2 21* 20* 20*  BUN 33* 41* 41*  CREATININE 1.48* 1.61* 1.60*  GLUCOSE 141* 199* 256*    Electrolytes Recent Labs  Lab 08/30/17 0852 08/31/17 1437 09/01/17 0407  CALCIUM 8.2* 8.1* 7.9*  MG 2.1 2.2  --   PHOS 3.5  --   --     CBC Recent Labs  Lab 08/30/17 0852 08/31/17 1437 09/01/17 0407  WBC 3.7* 4.9 2.5*  HGB 8.7* 8.5* 7.5*  HCT 26.6* 24.9* 22.9*  PLT 97* 112* 81*    Coag's Recent Labs  Lab 08/31/17 1511  INR 1.06    Sepsis Markers Recent Labs  Lab 08/31/17 1451  LATICACIDVEN 1.05    ABG No results for input(s): PHART, PCO2ART, PO2ART in the last 168 hours.  Liver Enzymes Recent Labs  Lab 08/30/17 0852 08/31/17 1437  AST 13 16  ALT 8 14  ALKPHOS 64 54  BILITOT <0.2* 0.6  ALBUMIN 2.1* 2.3*    Cardiac Enzymes  No results for input(s): TROPONINI, PROBNP in the last 168 hours.  Glucose Recent Labs  Lab 09/01/17 0116 09/01/17 0742 09/01/17 1126  GLUCAP 261* 205* 184*    Imaging Dg Chest 1 View  Result Date: 09/01/2017 CLINICAL DATA:  Congestive heart failure EXAM: CHEST  1 VIEW COMPARISON:  August 31, 2017 FINDINGS: The heart size and mediastinal contours are stable. Right central venous line is identified unchanged. Heart size is enlarged. There is pulmonary edema. Consolidation of left lung base with left pleural effusion is identified. Minimal right pleural effusion is noted. The visualized skeletal structures are stable. IMPRESSION: Congestive heart failure. Bilateral pleural effusions. Consolidation of left lung base underlying pneumonia is not excluded. Electronically Signed   By: Abelardo Diesel M.D.   On: 09/01/2017 15:25   Dg Abd 1 View  Result Date: 09/01/2017 CLINICAL DATA:  Congestive heart failure, diabetes and vasculitis. EXAM: ABDOMEN - 1 VIEW COMPARISON:  None. FINDINGS: There is no bowel obstruction or free air. Consolidation left lung base is identified. There  bilateral pleural effusions. IMPRESSION: No bowel obstruction or free air. Consolidation left lung base identified. Electronically Signed   By: Abelardo Diesel M.D.   On: 09/01/2017 15:26   Ct Renal Stone Study  Result Date: 08/31/2017 CLINICAL DATA:  Right-sided flank pain. EXAM: CT ABDOMEN AND PELVIS WITHOUT CONTRAST TECHNIQUE: Multidetector CT imaging of the abdomen and pelvis was performed following the standard protocol without IV contrast. COMPARISON:  07/22/2017 PET-CT FINDINGS: Lower chest: Small left and trace right pleural effusion. Moderate pericardial effusion, stable. Small hiatal hernia. Hepatobiliary: High-density liver without focal abnormality. The caudate is prominent but the liver surface is not definitely nodular. Negative gallbladder and common bile duct. Pancreas: Unremarkable. Spleen: Chronic splenomegaly, recently evaluated by PET. AP span is 19 cm. Adrenals/Urinary Tract: Negative adrenals. No hydronephrosis or stone. Unremarkable bladder. Stomach/Bowel:  No obstruction. No inflammatory changes Vascular/Lymphatic: Atherosclerotic calcification. No mass or adenopathy. Reproductive:Negative Other: Moderate ascites that is non loculated, increased. There is marked anasarca, progressed. Small periumbilical hernia containing ascitic fluid. Musculoskeletal: No acute abnormalities. IMPRESSION: 1. Generalized volume overload. Marked anasarca and moderate ascites has progressed from 07/22/2017. Small left effusion and moderate pericardial effusion are similar to prior. 2. Chronic splenomegaly, reference PET CT last month. Electronically Signed   By: Monte Fantasia M.D.   On: 08/31/2017 18:55     STUDIES:  CT abdomen, renal stone 6/20 >> small left and trace right pleural effusions, moderate pericardial effusion, stable.  Small hiatal hernia.  High density liver without focal abnormality, chronic splenomegaly.  Marked anasarca, moderate ascites.  No evidence of ureteral obstruction TTE 6/21  >>   CULTURES: Blood 6/19 >>  Blood 6/20 >>  Urine 6/19 >>   ANTIBIOTICS: Zosyn 6/19 >> 6/20 Unasyn 6/20 >>   SIGNIFICANT EVENTS: Emesis with apparent aspiration event, hypoxemia 6/20  LINES/TUBES:  DISCUSSION: 72 year old chronically ill woman with multiple medical problems including lymph plasmacytic lymphoma with associated myelodysplasia.  She was admitted with urinary tract infection.  She experienced emesis that she believes was caused by her chronic sinus drainage.  Subsequently she had acute respiratory distress with associated hypoxemia.  She was moved to the SDU on 100% mask.  Currently improved with less dyspnea  ASSESSMENT / PLAN:  PULMONARY A: Acute hypoxemic respiratory failure Bilateral pulmonary infiltrates, suspect aspiration pneumonitis/pneumonia Chronic sinus drainage P:   Agree with antibiotics as below Pulmonary hygiene, note Flonase ordered Wean her oxygen as able. She would be a reasonable candidate for  BiPAP if indicated to support her through hypoxemia or increased work of breathing.  She does not showing any signs currently that she is struggling her that she needs that kind of support.  Confirmed DNR/DNI status  CARDIOVASCULAR A:  History of systolic CHF P:  Echocardiogram pending.  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 Pancytopenia P:  Most recently treated with rituximab and bendamustine Follow CBC  INFECTIOUS A:   Urinary tract infection Probable aspiration pneumonia P:   Antibiotics changed to Unasyn, should  be adequate to cover both  ENDOCRINE A:   Diabetes mellitus Hypothyroidism P:   Sliding scale insulin Synthroid  NEUROLOGIC A:   No acute issues P:   RASS goal: N/A   FAMILY  - Updates: Updated bedside 6/20  - Inter-disciplinary family meet or Palliative Care meeting due by:  6/26   Independent CC time 40 minutes   Baltazar Apo, MD, PhD 09/01/2017, 4:12 PM [facility] [phone] or if no answer [phone]

## 2017-09-01 NOTE — Progress Notes (Signed)
Pharmacy Antibiotic Note  Emily Kennedy is a 72 y.o. female with lymphoma on chemotherapy admitted on 08/31/2017 with fever.  Patient initially started on Zosyn, now Pharmacy has been consulted for Unasyn dosing.  Plan:  Unasyn 1.5g IV q6h  Follow up renal function & cultures  Height: 5\' 4"  (162.6 cm) Weight: 188 lb 7.9 oz (85.5 kg) IBW/kg (Calculated) : 54.7  Temp (24hrs), Avg:98.9 F (37.2 C), Min:97.9 F (36.6 C), Max:101.2 F (38.4 C)  Recent Labs  Lab 08/30/17 0852 08/31/17 1437 08/31/17 1451 09/01/17 0407  WBC 3.7* 4.9  --  2.5*  CREATININE 1.48* 1.61*  --  1.60*  LATICACIDVEN  --   --  1.05  --     Estimated Creatinine Clearance: 34.6 mL/min (A) (by C-G formula based on SCr of 1.6 mg/dL (H)).    Allergies  Allergen Reactions  . Benadryl [Diphenhydramine] Swelling  . Ciprofloxacin Hives  . Furosemide Itching, Swelling, Rash and Other (See Comments)    Made edema worse  . Nitrofurantoin Monohyd Macro Hives  . Septra [Bactrim] Hives  . Sulfa Drugs Cross Reactors Hives  . Allopurinol Itching  . Ceclor [Cefaclor] Rash    Breaks out in rash chest down.    Antimicrobials this admission: 6/19 Zosyn >> 6/20 6/20 Unasyn >>  Dose adjustments this admission:   Microbiology results: 6/19 BCx: ngtd 6/19 UCx: sent  Thank you for allowing pharmacy to be a part of this patient's care.  Peggyann Juba, PharmD, BCPS Pager: [phone] 09/01/2017 2:51 PM

## 2017-09-01 NOTE — Progress Notes (Signed)
PT Cancellation Note  Patient Details Name: Emily Kennedy MRN: [id number] DOB: 09/28/1945   Cancelled Treatment:    Reason Eval/Treat Not Completed: Medical issues which prohibited therapy. Rapid response called. Pt transferred to ICU. Will hold PT for now.    Weston Anna, MPT Pager: [phone]

## 2017-09-01 NOTE — Progress Notes (Signed)
HEMATOLOGY/ONCOLOGY INPATIENT PROGRESS NOTE  Date of Service: 09/01/2017  Inpatient Attending: .Georgette Shell, MD   SUBJECTIVE:   [name] is accompanied today by her daughter at bedside. The pt reports that she is doing well overall.   The pt reports that her right side was hurting but this pain has resolved. She notes that she has had more than 2-3 UTIs each year, and notes that she has had some problems with incontinence. She also describes being short of breath and is currently on Oxygen for this which she notes is currently sufficient. She adds that her WBC have run low historically. She notes that her nausea has been settling down, but she had been vomiting several times during the day.   Lab results today (09/01/17) of CBC, CMP, and Reticulocytes is as follows: all values are WNL except for WBC at 2.5k, RBC at 2.65, HGB at 7.5, HCT at 22.9, PLT at 81. Differential pending.  She received her last treatment with BR chemotherapy on 08/30/2017 with only 50% Bendamustine (D2 was held).  On review of systems, pt reports resolving nausea, recent vomiting, controlled right flank pain, SOB and denies abdominal pains, and any other symptoms.   OBJECTIVE:  NAD  PHYSICAL EXAMINATION: . Vitals:   09/02/17 0900 09/02/17 1000 09/02/17 1100 09/02/17 1205  BP: (!) 178/74 (!) 171/69 (!) 169/72   Pulse: 84 81 85   Resp: (!) 27 (!) 21 20   Temp:    (!) 100.5 F (38.1 C)  TempSrc:    Oral  SpO2: 93% 94% 94%   Weight:      Height:       Filed Weights   08/31/17 2254 09/01/17 0449 09/02/17 0400  Weight: 186 lb 15.2 oz (84.8 kg) 188 lb 7.9 oz (85.5 kg) 184 lb 8.4 oz (83.7 kg)   .Body mass index is 32.35 kg/m.  GENERAL:alert, in no acute distress , [place] O2 on SKIN: no acute rashes EYES: normal, conjunctiva are pink and non-injected, sclera clear OROPHARYNX:no exudate, no erythema and lips, buccal mucosa, and tongue normal  NECK: supple, no JVD, thyroid normal size,  non-tender, without nodularity LYMPH:  no palpable lymphadenopathy in the cervical, axillary or inguinal LUNGS: clear to auscultation with normal respiratory effort HEART: regular rate & rhythm,  no murmurs and no lower extremity edema ABDOMEN: abdomen soft, mild rt flank TTP, normoactive bowel sounds , no hepato-splenomegaly palpable Musculoskeletal: no cyanosis of digits and no clubbing  PSYCH: alert & oriented x 3 with fluent speech NEURO: no focal motor/sensory deficits  MEDICAL HISTORY:  Past Medical History:  Diagnosis Date  . CHF (congestive heart failure) ([place])   . Diabetes mellitus   . Hypertension   . Thyroid disease   . Vasculitis ([place])     SURGICAL HISTORY: Past Surgical History:  Procedure Laterality Date  . IR FLUORO GUIDE PORT INSERTION RIGHT  07/26/2017  . IR US GUIDE VASC ACCESS RIGHT  07/26/2017  . MELANOMA EXCISION    . TUBAL LIGATION      SOCIAL HISTORY: Social History   Socioeconomic History  . Marital status: Divorced    Spouse name: Not on file  . Number of children: Not on file  . Years of education: Not on file  . Highest education level: Not on file  Occupational History  . Occupation: Retired  Scientific laboratory technician  . Financial resource strain: Not on file  . Food insecurity:    Worry: Not on file  Inability: Not on file  . Transportation needs:    Medical: Not on file    Non-medical: Not on file  Tobacco Use  . Smoking status: Never Smoker  . Smokeless tobacco: Never Used  Substance and Sexual Activity  . Alcohol use: No  . Drug use: No  . Sexual activity: Not Currently  Lifestyle  . Physical activity:    Days per week: Not on file    Minutes per session: Not on file  . Stress: Not on file  Relationships  . Social connections:    Talks on phone: Not on file    Gets together: Not on file    Attends religious service: Not on file    Active member of club or organization: Not on file    Attends meetings of clubs or organizations: Not on  file    Relationship status: Not on file  . Intimate partner violence:    Fear of current or ex partner: Not on file    Emotionally abused: Not on file    Physically abused: Not on file    Forced sexual activity: Not on file  Other Topics Concern  . Not on file  Social History Narrative  . Not on file    FAMILY HISTORY: Family History  Problem Relation Age of Onset  . Cancer Sister   . Diabetes Mellitus II Mother   . Tuberculosis Father     ALLERGIES:  is allergic to benadryl [diphenhydramine]; ciprofloxacin; furosemide; nitrofurantoin monohyd macro; septra [bactrim]; sulfa drugs cross reactors; allopurinol; and ceclor [cefaclor].  MEDICATIONS:  Scheduled Meds: . [START ON 09/02/2017] amLODipine  10 mg Oral Daily  . carvedilol  25 mg Oral BID  . chlorhexidine  15 mL Mouth Rinse BID  . diltiazem  90 mg Oral Q8H  . famotidine  20 mg Oral BID  . feeding supplement (GLUCERNA SHAKE)  237 mL Oral TID BM  . ferrous sulfate  325 mg Oral BID WC  . fluticasone  1 spray Each Nare Daily  . insulin aspart  0-9 Units Subcutaneous TID WC  . ipratropium-albuterol  3 mL Nebulization Q6H  . levothyroxine  224 mcg Oral QAC breakfast  . mouth rinse  15 mL Mouth Rinse q12n4p   Continuous Infusions: . ampicillin-sulbactam (UNASYN) IV     PRN Meds:.acetaminophen **OR** acetaminophen, lidocaine-prilocaine, ondansetron **OR** ondansetron (ZOFRAN) IV, traMADol  REVIEW OF SYSTEMS:    10 Point review of Systems was done is negative except as noted above.   LABORATORY DATA:  I have reviewed the data as listed  . CBC Latest Ref Rng & Units 09/01/2017 08/31/2017 08/30/2017  WBC 4.0 - 10.5 K/uL 2.5(L) 4.9 3.7(L)  Hemoglobin 12.0 - 15.0 g/dL 7.5(L) 8.5(L) 8.7(L)  Hematocrit 36.0 - 46.0 % 22.9(L) 24.9(L) 26.6(L)  Platelets 150 - 400 K/uL 81(L) 112(L) 97(L)    . CMP Latest Ref Rng & Units 09/01/2017 08/31/2017 08/30/2017  Glucose 65 - 99 mg/dL 256(H) 199(H) 141(H)  BUN 6 - 20 mg/dL 41(H) 41(H)  33(H)  Creatinine 0.44 - 1.00 mg/dL 1.60(H) 1.61(H) 1.48(H)  Sodium 135 - 145 mmol/L 134(L) 134(L) 134(L)  Potassium 3.5 - 5.1 mmol/L 5.1 4.6 4.4  Chloride 101 - 111 mmol/L 109 109 108  CO2 22 - 32 mmol/L 20(L) 20(L) 21(L)  Calcium 8.9 - 10.3 mg/dL 7.9(L) 8.1(L) 8.2(L)  Total Protein 6.5 - 8.1 g/dL - 4.8(L) 4.9(L)  Total Bilirubin 0.3 - 1.2 mg/dL - 0.6 <0.2(L)  Alkaline Phos 38 - 126 U/L -  54 64  AST 15 - 41 U/L - 16 13  ALT 14 - 54 U/L - 14 8     RADIOGRAPHIC STUDIES: I have personally reviewed the radiological images as listed and agreed with the findings in the report. Dg Chest 1 View  Result Date: 09/01/2017 CLINICAL DATA:  Congestive heart failure EXAM: CHEST  1 VIEW COMPARISON:  August 31, 2017 FINDINGS: The heart size and mediastinal contours are stable. Right central venous line is identified unchanged. Heart size is enlarged. There is pulmonary edema. Consolidation of left lung base with left pleural effusion is identified. Minimal right pleural effusion is noted. The visualized skeletal structures are stable. IMPRESSION: Congestive heart failure. Bilateral pleural effusions. Consolidation of left lung base underlying pneumonia is not excluded. Electronically Signed   By: Abelardo Diesel M.D.   On: 09/01/2017 15:25   Dg Chest 2 View  Result Date: 08/31/2017 CLINICAL DATA:  Fever. EXAM: CHEST - 2 VIEW COMPARISON:  Chest x-ray dated August 14, 2017. FINDINGS: Unchanged right chest wall port catheter with tip at the cavoatrial junction. Stable cardiomegaly. Normal pulmonary vascularity. Unchanged small left pleural effusion. No consolidation or pneumothorax. No acute osseous abnormality. IMPRESSION: 1. Unchanged small left pleural effusion. Electronically Signed   By: Titus Dubin M.D.   On: 08/31/2017 15:39   Dg Chest 2 View  Result Date: 08/14/2017 CLINICAL DATA:  Shortness of breath status post 1 round of chemotherapy approximately 25-30 days ago. Fever. EXAM: CHEST - 2 VIEW  COMPARISON:  Chest x-ray dated 02/02/2017. Chest CT dated 02/14/2016. FINDINGS: Stable cardiomegaly. Stable small LEFT pleural effusion. No new lung findings. RIGHT chest wall Port-A-Cath in place with tip adequately positioned at the level of the mid SVC. IMPRESSION: No acute findings. Stable small LEFT pleural effusion. No evidence of pneumonia or pulmonary edema. Electronically Signed   By: Franki Cabot M.D.   On: 08/14/2017 09:24   Dg Abd 1 View  Result Date: 09/01/2017 CLINICAL DATA:  Congestive heart failure, diabetes and vasculitis. EXAM: ABDOMEN - 1 VIEW COMPARISON:  None. FINDINGS: There is no bowel obstruction or free air. Consolidation left lung base is identified. There bilateral pleural effusions. IMPRESSION: No bowel obstruction or free air. Consolidation left lung base identified. Electronically Signed   By: Abelardo Diesel M.D.   On: 09/01/2017 15:26   Ct Renal Stone Study  Result Date: 08/31/2017 CLINICAL DATA:  Right-sided flank pain. EXAM: CT ABDOMEN AND PELVIS WITHOUT CONTRAST TECHNIQUE: Multidetector CT imaging of the abdomen and pelvis was performed following the standard protocol without IV contrast. COMPARISON:  07/22/2017 PET-CT FINDINGS: Lower chest: Small left and trace right pleural effusion. Moderate pericardial effusion, stable. Small hiatal hernia. Hepatobiliary: High-density liver without focal abnormality. The caudate is prominent but the liver surface is not definitely nodular. Negative gallbladder and common bile duct. Pancreas: Unremarkable. Spleen: Chronic splenomegaly, recently evaluated by PET. AP span is 19 cm. Adrenals/Urinary Tract: Negative adrenals. No hydronephrosis or stone. Unremarkable bladder. Stomach/Bowel:  No obstruction. No inflammatory changes Vascular/Lymphatic: Atherosclerotic calcification. No mass or adenopathy. Reproductive:Negative Other: Moderate ascites that is non loculated, increased. There is marked anasarca, progressed. Small periumbilical  hernia containing ascitic fluid. Musculoskeletal: No acute abnormalities. IMPRESSION: 1. Generalized volume overload. Marked anasarca and moderate ascites has progressed from 07/22/2017. Small left effusion and moderate pericardial effusion are similar to prior. 2. Chronic splenomegaly, reference PET CT last month. Electronically Signed   By: Monte Fantasia M.D.   On: 08/31/2017 18:55    ASSESSMENT & PLAN:  72 y.o. female with  1. Myelodysplastic syndrome -Medication related MDS with patient history of taking Methotrexate 20 years ago ?  2. Lymphoma - Malignant lymphoplasmacytic lymphoma -08/02/17 Hep B core antibody was positive, surface antigens negative -C2 with Rituxan and only one day of 90mg /m2 Bendamustine (50% dose reduction considering MDS)  3. Admitted with Sepsis related to UTI ? Rt pyelonephritis  4. 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 -Discussed pt labwork today, 09/01/17; HGB at 7.5, PLT at 81 -Will continue to monitor blood counts rpt CBC tomorrow - do not anticipate nadir from ctx yet (typical 7-14 days after chemotherapy). -if significant neutropenia might need to consider granix -transfuse prn for hgb <8 -transfuse platelets prn for plt<20k or if actively bleeding. - on discharge patient has f/u appointment with me on 09/13/2017    appreciate help from hospitalist team   The toal time spent in the appt was 25 minutes and more than 50% was on counseling and direct patient cares.    Sullivan Lone MD MS AAHIVMS [facility] Hematology/Oncology Physician [facility]  (Office):       [phone] (Work cell):  [phone] (Fax):           [phone]  09/01/2017 4:30 PM  I, Baldwin [place], am acting as a Education administrator for Dr Irene Limbo.   .I have reviewed the above documentation for accuracy and  completeness, and I agree with the above. Sullivan Lone MD MS

## 2017-09-01 NOTE — Progress Notes (Signed)
   09/01/17 1000  Clinical Encounter Type  Visited With Patient  Visit Type Initial;Psychological support;Spiritual support  Referral From Nurse  Consult/Referral To Chaplain  Spiritual Encounters  Spiritual Needs Other (Comment) (Advance Directive )  Stress Factors  Patient Stress Factors Other (Comment);Health changes (Advance Directive )  Advance Directives (For Healthcare)  Does Patient Have a Medical Advance Directive? No  Would patient like information on creating a medical advance directive? Yes (Inpatient - patient requests chaplain consult to create a medical advance directive) (Wants to wait until daughter is present to complete)   The patient wants to wait until her daughter is present to complete her Advance Directive .  Please, contact Spiritual Care for further assistance.   Chaplain Shanon Ace M.Div., [facility]

## 2017-09-02 ENCOUNTER — Inpatient Hospital Stay (HOSPITAL_COMMUNITY): Payer: Medicare Other

## 2017-09-02 DIAGNOSIS — I340 Nonrheumatic mitral (valve) insufficiency: Secondary | ICD-10-CM

## 2017-09-02 LAB — BASIC METABOLIC PANEL
ANION GAP: 5 (ref 5–15)
BUN: 42 mg/dL — ABNORMAL HIGH (ref 6–20)
CALCIUM: 8 mg/dL — AB (ref 8.9–10.3)
CHLORIDE: 110 mmol/L (ref 101–111)
CO2: 20 mmol/L — ABNORMAL LOW (ref 22–32)
Creatinine, Ser: 1.63 mg/dL — ABNORMAL HIGH (ref 0.44–1.00)
GFR calc Af Amer: 36 mL/min — ABNORMAL LOW (ref >60)
GFR calc non Af Amer: 31 mL/min — ABNORMAL LOW (ref >60)
GLUCOSE: 131 mg/dL — AB (ref 65–99)
POTASSIUM: 4.7 mmol/L (ref 3.5–5.1)
Sodium: 135 mmol/L (ref 135–145)

## 2017-09-02 LAB — BPAM RBC
Blood Product Expiration Date: 201907182359
ISSUE DATE / TIME: 201906201735
UNIT TYPE AND RH: 7300

## 2017-09-02 LAB — BODY FLUID CELL COUNT WITH DIFFERENTIAL
LYMPHS FL: 25 %
MONOCYTE-MACROPHAGE-SEROUS FLUID: 40 % — AB (ref 50–90)
NEUTROPHIL FLUID: 35 % — AB (ref 0–25)
Total Nucleated Cell Count, Fluid: 75 cu mm (ref 0–1000)

## 2017-09-02 LAB — DIFFERENTIAL
BASOS ABS: 0 10*3/uL (ref 0.0–0.1)
BASOS PCT: 0 %
EOS ABS: 0 10*3/uL (ref 0.0–0.7)
EOS PCT: 0 %
Lymphocytes Relative: 2 %
Lymphs Abs: 0.1 10*3/uL — ABNORMAL LOW (ref 0.7–4.0)
MONO ABS: 0.2 10*3/uL (ref 0.1–1.0)
Monocytes Relative: 5 %
Neutro Abs: 4.5 10*3/uL (ref 1.7–7.7)
Neutrophils Relative %: 93 %

## 2017-09-02 LAB — GLUCOSE, CAPILLARY
GLUCOSE-CAPILLARY: 153 mg/dL — AB (ref 65–99)
GLUCOSE-CAPILLARY: 112 mg/dL — AB (ref 65–99)
Glucose-Capillary: 189 mg/dL — ABNORMAL HIGH (ref 65–99)

## 2017-09-02 LAB — ECHOCARDIOGRAM COMPLETE
HEIGHTINCHES: 64 in
Weight: 2952.4 oz

## 2017-09-02 LAB — TYPE AND SCREEN
ABO/RH(D): B POS
ANTIBODY SCREEN: NEGATIVE
Unit division: 0

## 2017-09-02 LAB — URINE CULTURE

## 2017-09-02 LAB — CBC
HEMATOCRIT: 26 % — AB (ref 36.0–46.0)
HEMOGLOBIN: 8.7 g/dL — AB (ref 12.0–15.0)
MCH: 28.8 pg (ref 26.0–34.0)
MCHC: 33.5 g/dL (ref 30.0–36.0)
MCV: 86.1 fL (ref 78.0–100.0)
Platelets: 82 10*3/uL — ABNORMAL LOW (ref 150–400)
RBC: 3.02 MIL/uL — AB (ref 3.87–5.11)
RDW: 13.9 % (ref 11.5–15.5)
WBC: 5.1 10*3/uL (ref 4.0–10.5)

## 2017-09-02 LAB — LACTATE DEHYDROGENASE, PLEURAL OR PERITONEAL FLUID: LD, Fluid: 35 U/L — ABNORMAL HIGH (ref 3–23)

## 2017-09-02 LAB — T4, FREE: Free T4: 0.66 ng/dL — ABNORMAL LOW (ref 0.82–1.77)

## 2017-09-02 LAB — HEMOGLOBIN A1C
Hgb A1c MFr Bld: 6.1 % — ABNORMAL HIGH (ref 4.8–5.6)
MEAN PLASMA GLUCOSE: 128.37 mg/dL

## 2017-09-02 MED ORDER — HYDRALAZINE HCL 20 MG/ML IJ SOLN
10.0000 mg | Freq: Four times a day (QID) | INTRAMUSCULAR | Status: DC | PRN
  Administered 2017-09-02 – 2017-09-03 (×3): 10 mg via INTRAVENOUS
  Filled 2017-09-02 (×4): qty 1

## 2017-09-02 MED ORDER — SODIUM CHLORIDE 0.9% FLUSH
10.0000 mL | INTRAVENOUS | Status: DC | PRN
  Administered 2017-09-05 – 2017-09-06 (×3): 10 mL
  Filled 2017-09-02 (×3): qty 40

## 2017-09-02 MED ORDER — CHLORHEXIDINE GLUCONATE CLOTH 2 % EX PADS
6.0000 | MEDICATED_PAD | Freq: Every day | CUTANEOUS | Status: DC
  Administered 2017-09-02 – 2017-09-03 (×2): 6 via TOPICAL

## 2017-09-02 MED ORDER — SODIUM CHLORIDE 0.9% FLUSH
10.0000 mL | Freq: Two times a day (BID) | INTRAVENOUS | Status: DC
  Administered 2017-09-02 – 2017-09-05 (×3): 10 mL

## 2017-09-02 MED ORDER — BISACODYL 10 MG RE SUPP
10.0000 mg | Freq: Once | RECTAL | Status: AC
  Administered 2017-09-02: 10 mg via RECTAL
  Filled 2017-09-02: qty 1

## 2017-09-02 MED ORDER — LIDOCAINE HCL 1 % IJ SOLN
INTRAMUSCULAR | Status: AC
  Filled 2017-09-02: qty 20

## 2017-09-02 NOTE — Progress Notes (Signed)
PROGRESS NOTE    Emily Kennedy  ZMO:[id number] DOB: Sep 19, 1945 DOA: 08/31/2017 PCP: [name], MD  Brief Narrative::72 y.o.femalewith medical history significant ofHTN, HLD,CHF, DM type II, vasculitis, and malignant lymphoma currently receiving chemotherapy followed by [name] of oncology; who presents with complaints of fever. History is mostly obtained from the patient's daughter as patient defers to her arm. Over the last several weeks patient has had abdominal and bilateral leg swelling, but in the last week had progressively worsened to the point which patient was having difficulty ambulating. She was seen at the cancer center yesterday where she received chemotherapy of bendamustine andrituximab. Patient was also given Lasix 40 mg IV due to swelling.She had not been taking torsemide.Since yesterday thepatient reports only having urinated once since yesterday. Today patient reported acute onset of sharp bilateral flank pain(worse on the right flank) and abdominal pain. Temperature at home was noted to be up to around 101.5 F. Patient tried taking Tylenol and tramadol without relief of symptoms around noon. In addition to the leg swelling she noticed some mild redness of the legs and generalized malaise. Denies any significant shortness of breath, cough, chest pain, nausea, vomiting, dysuria, ordiarrhea.Per review of records it appears en routewith EMS patient was noted to be febrile up to 104.2 F and was given1000mg  ofTylenol prior to arrival.  ED Course:Upon admission into the emergency department patient was seen to befebrile up to 100.4 F, pulse 85-98, respirations 17-25, blood pressures upto 185/90,andO2 saturation 92-99% on room air.Labs revealed WBC 4.9, globin 8.5, platelets 112, sodium 134, BUN 41, creatinine 1.61, albumin 2.3, lactic acid 1.05, and INR 1.06. Urinalysis was positive for signs of possible infection and blood. CT scan of the abdomen  showed moderate ascites with generalized anasarca and small left pleural and pericardial effusion similar to previous studies. Patient was given empiric antibiotics of Zosyn and 2 L normal saline IV fluids   Assessment & Plan:   Principal Problem:   UTI (urinary tract infection) Active Problems:   Anemia, chronic disease   Essential hypertension   Chronic diastolic CHF (congestive heart failure) (HCC)   Controlled diabetes mellitus type 2 with complications (HCC)   Anasarca   Thrombocytopenia (HCC)   Malignant lymphoma (HCC)   Fever  1]UTI-patient presented with fever and abdominal pain found to have UTI.  Continue Unasyn. 2] mild lower extremity cellulitis due to swelling continue UNASYN  for now.  3] malignant lymphoma patient received chemotherapy 6/18 under the care of [name].  4] AKI patient admitted with a creatinine 1.61 down to 1.60 after IV hydration.  Baseline creatinine 1.2-1.4.  However I am hesitant to give her any more fluids due to anasarca pleural effusion pericardial effusion and lower extremity edema.  5] systolic CHF with ejection fraction 60 to 65% by an echo 05/2015.  Will recheck echo to evaluate ejection fraction.  Patient does have extravascular fluid retention.  We will monitor closely for now without any intervention at this time.  Is also having low albumin.  6] anemia of chronic disease -Hb dropped ?hemo dilutuion s/p 2 lit status post 1 unit of blood transfusion hemoglobin up to 8.7.  7] diet-controlled diabetes no recent hemoglobin A1c we will check a hemoglobin A1c continue SSI and CBG s.hba1c 6.1.  8]hypothyroidsm- free t4 low..tsh elevated.  Will not change the dose of an acute setting.  9] status post acute hypoxic respiratory failure secondary to aspiration-appreciate PCCM input.  Continue Unasyn follow-up chest x-ray.  Patient stable  and improving.  10] cycles patient will have diagnostic therapeutic paracentesis done.      DVT  prophylaxis SCD Code Status:DNR Family Communication: Discussed with daughter Disposition Plan:  TBD Consultants: PCCM   Procedures: None Antimicrobials: Unasyn Subjective: Able to speak in full sentences.  Patient resting in bed on 4 L of oxygen   Objective: Vitals:   09/02/17 1000 09/02/17 1100 09/02/17 1205 09/02/17 1400  BP: (!) 171/69 (!) 169/72  (!) 176/74  Pulse: 81 85  91  Resp: (!) 21 20  (!) 21  Temp:   (!) 100.5 F (38.1 C)   TempSrc:   Oral   SpO2: 94% 94%  94%  Weight:      Height:        Intake/Output Summary (Last 24 hours) at 09/02/2017 1515 Last data filed at 09/02/2017 1034 Gross per 24 hour  Intake 858.67 ml  Output 700 ml  Net 158.67 ml   Filed Weights   08/31/17 2254 09/01/17 0449 09/02/17 0400  Weight: 84.8 kg (186 lb 15.2 oz) 85.5 kg (188 lb 7.9 oz) 83.7 kg (184 lb 8.4 oz)    Examination:  General exam: Appears calm and comfortable  Respiratory system: Clear to auscultation. Respiratory effort normal. Cardiovascular system: S1 & S2 heard, RRR. No JVD, murmurs, rubs, gallops or clicks. No pedal edema. Gastrointestinal system: Abdomen is nondistended, soft and nontender. No organomegaly or masses felt. Normal bowel sounds heard. Central nervous system: Alert and oriented. No focal neurological deficits. Extremities: Symmetric 5 x 5 power. Skin: No rashes, lesions or ulcers Psychiatry: Judgement and insight appear normal. Mood & affect appropriate.     Data Reviewed: I have personally reviewed following labs and imaging studies  CBC: Recent Labs  Lab 08/30/17 0852 08/31/17 1437 09/01/17 0407 09/02/17 0616  WBC 3.7* 4.9 2.5* 5.1  NEUTROABS 3.2 4.5  --  4.5  HGB 8.7* 8.5* 7.5* 8.7*  HCT 26.6* 24.9* 22.9* 26.0*  MCV 86.1 87.7 86.4 86.1  PLT 97* 112* 81* 82*   Basic Metabolic Panel: Recent Labs  Lab 08/30/17 0852 08/31/17 1437 09/01/17 0407 09/02/17 0616  NA 134* 134* 134* 135  K 4.4 4.6 5.1 4.7  CL 108 109 109 110  CO2 21*  20* 20* 20*  GLUCOSE 141* 199* 256* 131*  BUN 33* 41* 41* 42*  CREATININE 1.48* 1.61* 1.60* 1.63*  CALCIUM 8.2* 8.1* 7.9* 8.0*  MG 2.1 2.2  --   --   PHOS 3.5  --   --   --    GFR: Estimated Creatinine Clearance: 33.6 mL/min (A) (by C-G formula based on SCr of 1.63 mg/dL (H)). Liver Function Tests: Recent Labs  Lab 08/30/17 0852 08/31/17 1437  AST 13 16  ALT 8 14  ALKPHOS 64 54  BILITOT <0.2* 0.6  PROT 4.9* 4.8*  ALBUMIN 2.1* 2.3*   No results for input(s): LIPASE, AMYLASE in the last 168 hours. No results for input(s): AMMONIA in the last 168 hours. Coagulation Profile: Recent Labs  Lab 08/31/17 1511  INR 1.06   Cardiac Enzymes: Recent Labs  Lab 08/31/17 1437 08/31/17 2139  CKTOTAL 40 42  CKMB 3.2  --    BNP (last 3 results) No results for input(s): PROBNP in the last 8760 hours. HbA1C: Recent Labs    09/02/17 0330  HGBA1C 6.1*   CBG: Recent Labs  Lab 09/01/17 0742 09/01/17 1126 09/01/17 1728 09/01/17 2113 09/02/17 1226  GLUCAP 205* 184* 116* 149* 153*   Lipid Profile: No  results for input(s): CHOL, HDL, LDLCALC, TRIG, CHOLHDL, LDLDIRECT in the last 72 hours. Thyroid Function Tests: Recent Labs    08/31/17 2141 09/02/17 0616  TSH 13.079*  --   FREET4  --  0.66*   Anemia Panel: No results for input(s): VITAMINB12, FOLATE, FERRITIN, TIBC, IRON, RETICCTPCT in the last 72 hours. Sepsis Labs: Recent Labs  Lab 08/31/17 1451  LATICACIDVEN 1.05    Recent Results (from the past 240 hour(s))  Culture, blood (Routine x 2)     Status: None (Preliminary result)   Collection Time: 08/31/17  2:40 PM  Result Value Ref Range Status   Specimen Description   Final    BLOOD PORTA CATH Performed at [facility], [place] [address] [place], [place] [address]    Special Requests   Final    BOTTLES DRAWN AEROBIC AND ANAEROBIC Blood Culture adequate volume Performed at [facility] [address] [place], [place]  [address]    Culture   Final    NO GROWTH 2 DAYS Performed at [place] [address] [place], [place] [address]    Report Status PENDING  Incomplete  Culture, blood (Routine x 2)     Status: None (Preliminary result)   Collection Time: 08/31/17  3:04 PM  Result Value Ref Range Status   Specimen Description   Final    BLOOD RIGHT ARM Performed at [facility] [address], [place], [place] [address]    Special Requests   Final    BOTTLES DRAWN AEROBIC AND ANAEROBIC Blood Culture adequate volume Performed at [facility] [address] [place], [place] [address]    Culture   Final    NO GROWTH 2 DAYS Performed at [place] [address], [place], [place] [address]    Report Status PENDING  Incomplete  Urine culture     Status: Abnormal   Collection Time: 08/31/17  5:36 PM  Result Value Ref Range Status   Specimen Description   Final    URINE, RANDOM Performed at [facility] [place] [address] [place], [place] [address]    Special Requests   Final    NONE Performed at [facility], [place] [address] [place], [place] [address]    Culture MULTIPLE SPECIES PRESENT, SUGGEST RECOLLECTION (A)  Final   Report Status 09/02/2017 FINAL  Final  Culture, blood (routine x 2)     Status: None (Preliminary result)   Collection Time: 09/01/17  3:17 PM  Result Value Ref Range Status   Specimen Description   Final    BLOOD RIGHT ANTECUBITAL Performed at [facility] [address], [place], [place] [address]    Special Requests   Final    BOTTLES DRAWN AEROBIC ONLY Blood Culture adequate volume Performed at [facility] [address], [place], [place] [address]    Culture   Final    NO GROWTH < 24 HOURS Performed at [place] [address] [place], [place] [address]    Report Status PENDING  Incomplete  MRSA PCR Screening     Status:  None   Collection Time: 09/01/17  3:30 PM  Result Value Ref Range Status   MRSA by PCR NEGATIVE NEGATIVE Final    Comment:        The GeneXpert MRSA Assay (FDA approved for NASAL specimens only), is one component of a comprehensive MRSA colonization  surveillance program. It is not intended to diagnose MRSA infection nor to guide or monitor treatment for MRSA infections. Performed at [facility], [place] [address] [place], [place] [address]   Culture, blood (routine x 2)     Status: None (Preliminary result)   Collection Time: 09/01/17  3:31 PM  Result Value Ref Range Status   Specimen Description   Final    BLOOD RIGHT HAND Performed at [facility] [address], [place], [place] [address]    Special Requests   Final    BOTTLES DRAWN AEROBIC ONLY Blood Culture adequate volume   Culture   Final    NO GROWTH < 24 HOURS Performed at [facility], [place] [address] [place], [place] [address]    Report Status PENDING  Incomplete         Radiology Studies: Dg Chest 1 View  Result Date: 09/01/2017 CLINICAL DATA:  Congestive heart failure EXAM: CHEST  1 VIEW COMPARISON:  August 31, 2017 FINDINGS: The heart size and mediastinal contours are stable. Right central venous line is identified unchanged. Heart size is enlarged. There is pulmonary edema. Consolidation of left lung base with left pleural effusion is identified. Minimal right pleural effusion is noted. The visualized skeletal structures are stable. IMPRESSION: Congestive heart failure. Bilateral pleural effusions. Consolidation of left lung base underlying pneumonia is not excluded. Electronically Signed   By: Abelardo Diesel M.D.   On: 09/01/2017 15:25   Dg Chest 2 View  Result Date: 08/31/2017 CLINICAL DATA:  Fever. EXAM: CHEST - 2 VIEW COMPARISON:  Chest x-ray dated August 14, 2017. FINDINGS: Unchanged right chest wall port catheter with tip at the cavoatrial junction. Stable  cardiomegaly. Normal pulmonary vascularity. Unchanged small left pleural effusion. No consolidation or pneumothorax. No acute osseous abnormality. IMPRESSION: 1. Unchanged small left pleural effusion. Electronically Signed   By: Titus Dubin M.D.   On: 08/31/2017 15:39   Dg Abd 1 View  Result Date: 09/01/2017 CLINICAL DATA:  Congestive heart failure, diabetes and vasculitis. EXAM: ABDOMEN - 1 VIEW COMPARISON:  None. FINDINGS: There is no bowel obstruction or free air. Consolidation left lung base is identified. There bilateral pleural effusions. IMPRESSION: No bowel obstruction or free air. Consolidation left lung base identified. Electronically Signed   By: Abelardo Diesel M.D.   On: 09/01/2017 15:26   Ct Renal Stone Study  Result Date: 08/31/2017 CLINICAL DATA:  Right-sided flank pain. EXAM: CT ABDOMEN AND PELVIS WITHOUT CONTRAST TECHNIQUE: Multidetector CT imaging of the abdomen and pelvis was performed following the standard protocol without IV contrast. COMPARISON:  07/22/2017 PET-CT FINDINGS: Lower chest: Small left and trace right pleural effusion. Moderate pericardial effusion, stable. Small hiatal hernia. Hepatobiliary: High-density liver without focal abnormality. The caudate is prominent but the liver surface is not definitely nodular. Negative gallbladder and common bile duct. Pancreas: Unremarkable. Spleen: Chronic splenomegaly, recently evaluated by PET. AP span is 19 cm. Adrenals/Urinary Tract: Negative adrenals. No hydronephrosis or stone. Unremarkable bladder. Stomach/Bowel:  No obstruction. No inflammatory changes Vascular/Lymphatic: Atherosclerotic calcification. No mass or adenopathy. Reproductive:Negative Other: Moderate ascites that is non loculated, increased. There is marked anasarca, progressed. Small periumbilical hernia containing ascitic fluid. Musculoskeletal: No acute abnormalities. IMPRESSION: 1. Generalized volume overload. Marked anasarca and moderate ascites has progressed  from 07/22/2017. Small left effusion and moderate pericardial effusion are similar to prior. 2. Chronic splenomegaly, reference PET CT last month. Electronically Signed   By: Monte Fantasia M.D.   On: 08/31/2017  18:55        Scheduled Meds: . amLODipine  10 mg Oral Daily  . carvedilol  25 mg Oral BID  . chlorhexidine  15 mL Mouth Rinse BID  . Chlorhexidine Gluconate Cloth  6 each Topical Daily  . diltiazem  90 mg Oral Q8H  . famotidine  20 mg Oral BID  . feeding supplement (GLUCERNA SHAKE)  237 mL Oral TID BM  . ferrous sulfate  325 mg Oral BID WC  . fluticasone  1 spray Each Nare Daily  . insulin aspart  0-9 Units Subcutaneous TID WC  . ipratropium-albuterol  3 mL Nebulization TID  . levothyroxine  224 mcg Oral QAC breakfast  . mouth rinse  15 mL Mouth Rinse q12n4p  . sodium chloride flush  10-40 mL Intracatheter Q12H   Continuous Infusions: . ampicillin-sulbactam (UNASYN) IV Stopped (09/02/17 1034)     LOS: 2 days     Georgette Shell, MD [facility]  If 7PM-7AM, please contact night-coverage [email or web address] Password [facility] 09/02/2017, 3:15 PM

## 2017-09-02 NOTE — Procedures (Signed)
Ultrasound-guided diagnostic and therapeutic paracentesis performed yielding 3.3 liters of hazy, light yellow fluid. No immediate complications. A portion of the fluid was sent to the lab for preordered studies.

## 2017-09-02 NOTE — Evaluation (Addendum)
Occupational Therapy Evaluation Patient Details Name: Emily Kennedy MRN: [id number] DOB: Aug 09, 1945 Today's Date: 09/02/2017    History of Present Illness 72 year old woman with a history of hypertension, diastolic CHF, diabetes, RA, lymphoplasmacytic lymphoma. She was admitted 6/19 with fever, bilateral flank pain, acute renal insufficiency, and acute urinary tract infection.  A CT of her abdomen showed volume overload, anasarca with ascites, a small left effusion and no evidence of ureteral obstruction.     Clinical Impression   Pt was admitted for the above.  Per PT, pt needed assistance pushing w/c over the last month. She reports that she was mod I for adls.  Pt has generalized weakness and needs mod/max +2 for mobility and up to total A for LB adls. Will follow in acute setting.  Initial goals will focus on mobility during ADLS to decrease caregiver burden.     Follow Up Recommendations  SNF    Equipment Recommendations  None recommended by OT    Recommendations for Other Services       Precautions / Restrictions Precautions Precautions: Fall Restrictions Weight Bearing Restrictions: No      Mobility Bed Mobility Overal bed mobility: Needs Assistance Bed Mobility: Supine to Sit     Supine to sit: Mod assist;+2 for safety/equipment;+2 for physical assistance Sit to supine: Mod assist;Max assist;+2 for physical assistance   General bed mobility comments: assist for trunk and legs  Transfers Overall transfer level: Needs assistance Equipment used: 2 person hand held assist Transfers: Sit to/from Stand;Stand Pivot Transfers Sit to Stand: Mod assist;+2 physical assistance Stand pivot transfers: Max assist;+2 physical assistance       General transfer comment: assist to power up and stabilize.  Noted pt was unable to move RLE with PT earlier:  transferred to L and she was unable to move LLE this session. Assisted with turning L foot for transfer    Balance  Overall balance assessment: Needs assistance Sitting-balance support: Feet unsupported;Feet supported Sitting balance-Leahy Scale: Fair Sitting balance - Comments: from unsupported in chair (firm surface) with feet supported Postural control: Posterior lean Standing balance support: Bilateral upper extremity supported Standing balance-Leahy Scale: Poor Standing balance comment: UE support and mod A for standing                            ADL either performed or assessed with clinical judgement   ADL Overall ADL's : Needs assistance/impaired Eating/Feeding: Independent   Grooming: Set up;Sitting   Upper Body Bathing: Minimal assistance;Standing   Lower Body Bathing: Maximal assistance;Sit to/from stand   Upper Body Dressing : Minimal assistance;Sitting   Lower Body Dressing: Total assistance;Sit to/from stand   Toilet Transfer: Maximal assistance;+2 for physical assistance;Stand-pivot(to bed)             General ADL Comments: pt was dozing in chair when OT arrived:  RN assisted with transfer back to bed at end of session. Pt incontinent x bowel:  had laxative     Vision         Perception     Praxis      Pertinent Vitals/Pain Pain Assessment: No/denies pain     Hand Dominance     Extremity/Trunk Assessment Upper Extremity Assessment Upper Extremity Assessment: Generalized weakness   Lower Extremity Assessment Lower Extremity Assessment: RLE deficits/detail;LLE deficits/detail RLE Deficits / Details: AROM limited due to edema in LE's mostly behind knee, strength hip flexion 2/5, knee extension 3+/5, limited ankle DF pt  reports some pain with R ankle DF RLE Sensation: history of peripheral neuropathy LLE Deficits / Details: AROM limited with edema in LE's, strength hip flexion 3-/5, knee extension 4-/5 LLE Sensation: history of peripheral neuropathy   Cervical / Trunk Assessment Cervical / Trunk Assessment: Kyphotic   Communication  Communication Communication: No difficulties   Cognition Arousal/Alertness: Awake/alert Behavior During Therapy: WFL for tasks assessed/performed Overall Cognitive Status: mostly Within Functional Limits for tasks assessed; need to verify PLOF                                     General Comments  Sats dropped to high 70s; recovered quickly with cues for deep breath    Exercises     Shoulder Instructions      Home Living Family/patient expects to be discharged to:: Unsure Living Arrangements: Children Available Help at Discharge: Family;Available 24 hours/day Type of Home: House Home Access: Level entry     Home Layout: One level     Bathroom Shower/Tub: Teacher, early years/pre: Standard     Home Equipment: Environmental consultant - 2 wheels;Wheelchair - [employer];Shower seat;Grab bars - tub/shower;Hand held shower head;Other (comment)   Additional Comments: bed rails      Prior Functioning/Environment Level of Independence: Needs assistance  Gait / Transfers Assistance Needed: was having assist for short distance ambulation up until more difficulty since 1 month; usually propels w/c with LE's on her own ADL's / Homemaking Assistance Needed: as noted by PT:  in last month, needed assist to propel w/c.  She reports she was mod I with adls; unable to verify            OT Problem List: Decreased strength;Decreased activity tolerance;Impaired balance (sitting and/or standing);Decreased knowledge of use of DME or AE;Cardiopulmonary status limiting activity      OT Treatment/Interventions: Self-care/ADL training;Energy conservation;DME and/or AE instruction;Patient/family education;Balance training;Therapeutic activities    OT Goals(Current goals can be found in the care plan section) Acute Rehab OT Goals Patient Stated Goal: agreeable to SNF when medically stable OT Goal Formulation: With patient Time For Goal Achievement: 09/16/17 Potential to  Achieve Goals: Good ADL Goals Pt Will Transfer to Toilet: with mod assist;bedside commode;with +2 assist;stand pivot transfer Additional ADL Goal #1: pt will go from sit to stand with mod A and maintain for 2 minutes with min guard for adls Additional ADL Goal #2: pt will perform bed mobility with min A in preparation for adls/toilet transfers  OT Frequency: Min 2X/week   Barriers to D/C:            Co-evaluation              AM-PAC PT "6 Clicks" Daily Activity     Outcome Measure Help from another person eating meals?: None Help from another person taking care of personal grooming?: A Little Help from another person toileting, which includes using toliet, bedpan, or urinal?: A Lot Help from another person bathing (including washing, rinsing, drying)?: A Lot Help from another person to put on and taking off regular upper body clothing?: A Little Help from another person to put on and taking off regular lower body clothing?: Total 6 Click Score: 15   End of Session    Activity Tolerance: Patient limited by fatigue Patient left: in bed;with call bell/phone within reach;with nursing/sitter in room  OT Visit Diagnosis: Unsteadiness on feet (R26.81);Muscle weakness (generalized) (M62.81)  Time: 7579-7282 OT Time Calculation (min): 35 min Charges:  OT General Charges $OT Visit: 1 Visit OT Evaluation $OT Eval Moderate Complexity: 1 Mod OT Treatments $Self Care/Home Management : 8-22 mins G-Codes:     [place], OTR/L [phone] 09/02/2017  Sameka Bagent 09/02/2017, 2:22 PM

## 2017-09-02 NOTE — Evaluation (Signed)
Physical Therapy Evaluation Patient Details Name: Emily Kennedy MRN: [id number] DOB: 05/20/1945 Today's Date: 09/02/2017   History of Present Illness  72 year old woman with a history of hypertension, diastolic CHF, diabetes, RA, lymphoplasmacytic lymphoma. She was admitted 6/19 with fever, bilateral flank pain, acute renal insufficiency, and acute urinary tract infection.  A CT of her abdomen showed volume overload, anasarca with ascites, a small left effusion and no evidence of ureteral obstruction.    Clinical Impression  Patient presents with decreased mobility due to general weakness, decreased ROM, decreased balance and poor activity tolerance.  Currently mod to max A of 2 for OOB to chair transfers.  She was functioning basically mod I for w/c transfers prior to decline about a month ago.  She will benefit from skilled PT in the acute setting to allow return home following SNF level rehab stay.    Follow Up Recommendations SNF;Supervision/Assistance - 24 hour    Equipment Recommendations  None recommended by PT    Recommendations for Other Services       Precautions / Restrictions Precautions Precautions: Fall      Mobility  Bed Mobility Overal bed mobility: Needs Assistance Bed Mobility: Supine to Sit     Supine to sit: Mod assist;+2 for safety/equipment;+2 for physical assistance     General bed mobility comments: assist to move legs and to lift trunk, use of pad under pt to scoot forward  Transfers Overall transfer level: Needs assistance Equipment used: Rolling walker (2 wheeled) Transfers: Sit to/from [facility] Sit to Stand: Mod assist;+2 physical assistance Stand pivot transfers: Max assist;+2 physical assistance       General transfer comment: lifting assist to stand with pt pulling up on walker, unable to get up entirely erect, and unable to move R foot, but could move L foot so moved bed to place chair further under  her  Ambulation/Gait             General Gait Details: unable  Stairs            Wheelchair Mobility    Modified Rankin (Stroke Patients Only)       Balance Overall balance assessment: Needs assistance Sitting-balance support: Feet unsupported;Feet supported Sitting balance-Leahy Scale: Poor Sitting balance - Comments: initially difficulty getting feet to the floor, then with feet supported still needed at least minguard for sitting balance with posterior bias Postural control: Posterior lean Standing balance support: Bilateral upper extremity supported Standing balance-Leahy Scale: Poor Standing balance comment: UE support and mod A for standing                              Pertinent Vitals/Pain Pain Assessment: No/denies pain    Home Living Family/patient expects to be discharged to:: Private residence Living Arrangements: Children(daughter) Available Help at Discharge: Family;Available 24 hours/day Type of Home: House Home Access: Level entry     Home Layout: One level Home Equipment: Walker - 2 wheels;Wheelchair - [employer];Shower seat;Grab bars - tub/shower;Hand held shower head;Other (comment) Additional Comments: bed rails    Prior Function Level of Independence: Needs assistance   Gait / Transfers Assistance Needed: was having assist for short distance ambulation up until more difficulty since 1 month; usually propels w/c with LE's on her own           Hand Dominance        Extremity/Trunk Assessment   Upper Extremity Assessment Upper Extremity Assessment: Generalized  weakness    Lower Extremity Assessment Lower Extremity Assessment: RLE deficits/detail;LLE deficits/detail RLE Deficits / Details: AROM limited due to edema in LE's mostly behind knee, strength hip flexion 2/5, knee extension 3+/5, limited ankle DF pt reports some pain with R ankle DF RLE Sensation: history of peripheral neuropathy LLE Deficits /  Details: AROM limited with edema in LE's, strength hip flexion 3-/5, knee extension 4-/5 LLE Sensation: history of peripheral neuropathy    Cervical / Trunk Assessment Cervical / Trunk Assessment: Kyphotic  Communication   Communication: No difficulties  Cognition Arousal/Alertness: Awake/alert Behavior During Therapy: WFL for tasks assessed/performed Overall Cognitive Status: Within Functional Limits for tasks assessed                                        General Comments General comments (skin integrity, edema, etc.): soiled with BM so assist for cleaning in standing prior to attempts to pivot to chair. son in room and gave history as well as pt.    Exercises     Assessment/Plan    PT Assessment Patient needs continued PT services  PT Problem List Decreased strength;Decreased mobility;Decreased activity tolerance;Decreased balance;Decreased knowledge of use of DME;Decreased knowledge of precautions;Decreased range of motion       PT Treatment Interventions DME instruction;Functional mobility training;Balance training;Patient/family education;Therapeutic activities;Therapeutic exercise    PT Goals (Current goals can be found in the Care Plan section)  Acute Rehab PT Goals Patient Stated Goal: agreeable to SNF when medically stable PT Goal Formulation: With patient/family Time For Goal Achievement: 09/16/17 Potential to Achieve Goals: Fair    Frequency Min 2X/week   Barriers to discharge        Co-evaluation               AM-PAC PT "6 Clicks" Daily Activity  Outcome Measure Difficulty turning over in bed (including adjusting bedclothes, sheets and blankets)?: Unable Difficulty moving from lying on back to sitting on the side of the bed? : Unable Difficulty sitting down on and standing up from a chair with arms (e.g., wheelchair, bedside commode, etc,.)?: Unable Help needed moving to and from a bed to chair (including a wheelchair)?: Total Help  needed walking in hospital room?: Total Help needed climbing 3-5 steps with a railing? : Total 6 Click Score: 6    End of Session Equipment Utilized During Treatment: Gait belt;Oxygen Activity Tolerance: Patient limited by fatigue Patient left: with call bell/phone within reach;in chair;with family/visitor present Nurse Communication: Mobility status;Other (comment)(check temp as pt felt hot) PT Visit Diagnosis: Other abnormalities of gait and mobility (R26.89);Muscle weakness (generalized) (M62.81)    Time: 8502-7741 PT Time Calculation (min) (ACUTE ONLY): 25 min   Charges:   PT Evaluation $PT Eval Moderate Complexity: 1 Mod PT Treatments $Therapeutic Activity: 8-22 mins   PT G CodesMagda Kiel, [place] [phone] 09/02/2017   Reginia Naas 09/02/2017, 12:21 PM

## 2017-09-02 NOTE — Progress Notes (Signed)
  Echocardiogram 2D Echocardiogram has been performed.  Bernadene Garside G Delayni Streed 09/02/2017, 2:50 PM

## 2017-09-02 NOTE — Progress Notes (Signed)
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  She has had progressively worsening functional capacity, difficulty ambulating.  She was admitted 6/19 with fever, bilateral flank pain, acute renal insufficiency.  Urinalysis consistent with an acute urinary tract infection.  She was treated empirically with Zosyn.  A CT of her abdomen showed volume overload, anasarca with ascites, a small left effusion and no evidence of ureteral obstruction.    This afternoon she experienced emesis and then quickly developed dyspnea, hypoxemia.  She was placed on high flow oxygen.  Chest x-ray was performed and I have reviewed.  This shows evidence for evolving right greater than left interstitial infiltrates, chronic left lower lobe atelectasis versus consolidation and small effusions.  She was moved to the stepdown unit given her progressive dyspnea and hypoxemia.  SUBJECTIVE:  Looks and feels better Now weaned to nasal cannula oxygen 4 L/min   VITAL SIGNS: BP (!) 178/74   Pulse 84   Temp 99 F (37.2 C) (Oral)   Resp (!) 27   Ht 5\' 4"  (1.626 m)   Wt 83.7 kg (184 lb 8.4 oz)   SpO2 93%   BMI 31.67 kg/m   HEMODYNAMICS:    VENTILATOR SETTINGS: FiO2 (%):  [55 %] 55 %  INTAKE / OUTPUT: I/O last 3 completed shifts: In: 1338.7 [P.O.:600; Blood:398.7; IV Piggyback:340] Out: 2250 [Urine:2250]  PHYSICAL EXAMINATION: General: Ill-appearing thin woman, more comfortable, nasal cannula oxygen Neuro: Awake, alert, interacting, follows commands HEENT: A bit  difficult to understand but more clear today, oropharynx clear edentulous Cardiovascular: Distant, regular, no murmur, trace lower extremity edema Lungs: Coarse bilateral breath sounds with inspiratory crackles, no wheezing Abdomen: Soft, nontender with positive bowel sounds Musculoskeletal: No deformity Skin: No rash  LABS:  BMET Recent Labs  Lab 08/31/17 1437 09/01/17 0407 09/02/17 0616  NA 134* 134* 135  K 4.6 5.1 4.7  CL 109 109 110  CO2 20* 20* 20*  BUN 41* 41* 42*  CREATININE 1.61* 1.60* 1.63*  GLUCOSE 199* 256* 131*    Electrolytes Recent Labs  Lab 08/30/17 0852 08/31/17 1437 09/01/17 0407 09/02/17 0616  CALCIUM 8.2* 8.1* 7.9* 8.0*  MG 2.1 2.2  --   --   PHOS 3.5  --   --   --     CBC Recent Labs  Lab 08/31/17 1437 09/01/17 0407 09/02/17 0616  WBC 4.9 2.5* 5.1  HGB 8.5* 7.5* 8.7*  HCT 24.9* 22.9* 26.0*  PLT 112* 81* 82*    Coag's Recent Labs  Lab 08/31/17 1511  INR 1.06    Sepsis Markers Recent Labs  Lab 08/31/17 1451  LATICACIDVEN 1.05    ABG No results for input(s): PHART, PCO2ART, PO2ART in the last 168 hours.  Liver Enzymes Recent Labs  Lab 08/30/17 0852 08/31/17 1437  AST 13 16  ALT 8 14  ALKPHOS 64 54  BILITOT <0.2* 0.6  ALBUMIN 2.1* 2.3*    Cardiac Enzymes No results for input(s): TROPONINI, PROBNP in the last 168 hours.  Glucose Recent Labs  Lab 09/01/17 0116 09/01/17 0742 09/01/17 1126 09/01/17 1728 09/01/17 2113  GLUCAP 261* 205* 184*  116* 149*    Imaging Dg Chest 1 View  Result Date: 09/01/2017 CLINICAL DATA:  Congestive heart failure EXAM: CHEST  1 VIEW COMPARISON:  August 31, 2017 FINDINGS: The heart size and mediastinal contours are stable. Right central venous line is identified unchanged. Heart size is enlarged. There is pulmonary edema. Consolidation of left lung base with left pleural effusion is identified. Minimal right pleural effusion is noted. The visualized skeletal structures are stable.  IMPRESSION: Congestive heart failure. Bilateral pleural effusions. Consolidation of left lung base underlying pneumonia is not excluded. Electronically Signed   By: Abelardo Diesel M.D.   On: 09/01/2017 15:25   Dg Abd 1 View  Result Date: 09/01/2017 CLINICAL DATA:  Congestive heart failure, diabetes and vasculitis. EXAM: ABDOMEN - 1 VIEW COMPARISON:  None. FINDINGS: There is no bowel obstruction or free air. Consolidation left lung base is identified. There bilateral pleural effusions. IMPRESSION: No bowel obstruction or free air. Consolidation left lung base identified. Electronically Signed   By: Abelardo Diesel M.D.   On: 09/01/2017 15:26     STUDIES:  CT abdomen, renal stone 6/20 >> small left and trace right pleural effusions, moderate pericardial effusion, stable.  Small hiatal hernia.  High density liver without focal abnormality, chronic splenomegaly.  Marked anasarca, moderate ascites.  No evidence of ureteral obstruction TTE 6/21 >>   CULTURES: Blood 6/19 >>  Blood 6/20 >>  Urine 6/19 >>   ANTIBIOTICS: Zosyn 6/19 >> 6/20 Unasyn 6/20 >>   SIGNIFICANT EVENTS: Emesis with apparent aspiration event, hypoxemia 6/20  LINES/TUBES:  DISCUSSION: 72 year old chronically ill woman with multiple medical problems including lymph plasmacytic lymphoma with associated myelodysplasia.  She was admitted with urinary tract infection.  She experienced emesis that she believes was caused by her chronic sinus drainage.  Subsequently she had acute respiratory distress with associated hypoxemia.  She was moved to the SDU on 100% mask.  Improved, now on 4 L nasal cannula  ASSESSMENT / PLAN:  PULMONARY A: Acute hypoxemic respiratory failure Bilateral pulmonary infiltrates, suspect aspiration pneumonitis/pneumonia Chronic sinus drainage P:   Continue to push pulmonary hygiene Agree with antibiotics as below Follow chest x-ray for evolution, improvement in her right greater than left infiltrates  following presumed aspiration Flonase as ordered Continue to wean oxygen as able Reasonable BiPAP candidate if she were to decline but she clearly does not need at this time.  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 Call if we can assist   Baltazar Apo, MD, PhD 09/02/2017, 9:51 AM [facility] [phone] or if no answer [phone]

## 2017-09-03 LAB — GLUCOSE, CAPILLARY
GLUCOSE-CAPILLARY: 176 mg/dL — AB (ref 65–99)
GLUCOSE-CAPILLARY: 145 mg/dL — AB (ref 65–99)
GLUCOSE-CAPILLARY: 202 mg/dL — AB (ref 65–99)
Glucose-Capillary: 146 mg/dL — ABNORMAL HIGH (ref 65–99)

## 2017-09-03 LAB — CBC
HCT: 24.3 % — ABNORMAL LOW (ref 36.0–46.0)
Hemoglobin: 8.3 g/dL — ABNORMAL LOW (ref 12.0–15.0)
MCH: 29.3 pg (ref 26.0–34.0)
MCHC: 34.2 g/dL (ref 30.0–36.0)
MCV: 85.9 fL (ref 78.0–100.0)
PLATELETS: 103 10*3/uL — AB (ref 150–400)
RBC: 2.83 MIL/uL — ABNORMAL LOW (ref 3.87–5.11)
RDW: 14.1 % (ref 11.5–15.5)
WBC: 4.6 10*3/uL (ref 4.0–10.5)

## 2017-09-03 LAB — BASIC METABOLIC PANEL
Anion gap: 5 (ref 5–15)
BUN: 44 mg/dL — ABNORMAL HIGH (ref 6–20)
CHLORIDE: 109 mmol/L (ref 101–111)
CO2: 20 mmol/L — ABNORMAL LOW (ref 22–32)
CREATININE: 1.61 mg/dL — AB (ref 0.44–1.00)
Calcium: 7.7 mg/dL — ABNORMAL LOW (ref 8.9–10.3)
GFR calc Af Amer: 36 mL/min — ABNORMAL LOW (ref >60)
GFR, EST NON AFRICAN AMERICAN: 31 mL/min — AB (ref >60)
Glucose, Bld: 139 mg/dL — ABNORMAL HIGH (ref 65–99)
Potassium: 4.5 mmol/L (ref 3.5–5.1)
SODIUM: 134 mmol/L — AB (ref 135–145)

## 2017-09-03 NOTE — Progress Notes (Signed)
PROGRESS NOTE    Emily Kennedy  DPO:[id number] DOB: [birth year] DOA: 08/31/2017 PCP: [name], MD  Brief Narrative: :72 y.o.femalewith medical history significant ofHTN, HLD,CHF, DM type II, vasculitis, and malignant lymphoma currently receiving chemotherapy followed by [name] of oncology; who presents with complaints of fever. History is mostly obtained from the patient's daughter as patient defers to her arm. Over the last several weeks patient has had abdominal and bilateral leg swelling, but in the last week had progressively worsened to the point which patient was having difficulty ambulating. She was seen at the cancer center yesterday where she received chemotherapy of bendamustine andrituximab. Patient was also given Lasix 40 mg IV due to swelling.She had not been taking torsemide.Since yesterday thepatient reports only having urinated once since yesterday. Today patient reported acute onset of sharp bilateral flank pain(worse on the right flank) and abdominal pain. Temperature at home was noted to be up to around 101.5 F. Patient tried taking Tylenol and tramadol without relief of symptoms around noon. In addition to the leg swelling she noticed some mild redness of the legs and generalized malaise. Denies any significant shortness of breath, cough, chest pain, nausea, vomiting, dysuria, ordiarrhea.Per review of records it appears en routewith EMS patient was noted to be febrile up to 104.2 F and was given1000mg  ofTylenol prior to arrival.  ED Course:Upon admission into the emergency department patient was seen to befebrile up to 100.4 F, pulse 85-98, respirations 17-25, blood pressures upto 185/90,andO2 saturation 92-99% on room air.Labs revealed WBC 4.9, globin 8.5, platelets 112, sodium 134, BUN 41, creatinine 1.61, albumin 2.3, lactic acid 1.05, and INR 1.06. Urinalysis was positive for signs of possible infection and blood. CT scan of the abdomen  showed moderate ascites with generalized anasarca and small left pleural and pericardial effusion similar to previous studies. Patient was given empiric antibiotics of Zosyn and 2 L normal saline IV fluids      Assessment & Plan:   Principal Problem:   UTI (urinary tract infection) Active Problems:   Anemia, chronic disease   Essential hypertension   Chronic diastolic CHF (congestive heart failure) (HCC)   Controlled diabetes mellitus type 2 with complications (HCC)   Anasarca   Thrombocytopenia (HCC)   Malignant lymphoma (HCC)   Fever  1]UTI-patient presented with fever and abdominal pain found to have UTI.  Cornelius Moras She spiked a temp of 102.3 last night continue IV Unasyn for now. 2]mild lower extremity cellulitis due to swelling continue UNASYN  for now.  3]malignant lymphoma patient received chemotherapy 6/18 under the care of Dr.Kale.  4]AKI patient Baseline creatinine 1.2-1.4. However I am hesitant to give her any more fluids due to anasarca pleural effusion pericardial effusion and lower extremity edema.  5]systolic CHF with ejection fraction 60 to 65% by an echo 05/2015. Echo 09/02/2017 Left ventricle: The cavity size was normal. Systolic function was   normal. The estimated ejection fraction was in the range of 55%   to 60%. Wall motion was normal; there were no regional wall   motion abnormalities. - Aortic valve: There was trivial regurgitation. - Mitral valve: There was moderate regurgitation. - Left atrium: The atrium was moderately dilated. Volume/bsa, S:   45.8 ml/m^2. - Tricuspid valve: There was moderate regurgitation. - Pulmonary arteries: Systolic pressure was severely increased. PA   peak pressure: 69 mm Hg (S). - Pericardium, extracardiac: A small pericardial effusion was   identified circumferential to the heart (posterior coagulation   noted). There was no evidence  of hemodynamic compromise. There   was a left pleural effusion.-will recheck  echo to evaluate ejection fraction. 6]anemia of chronic disease -Hb dropped ?hemo dilutuion s/p 2 lit status post 1 unit of blood transfusion hemoglobin up to 8.7.  7]diet-controlled diabetes  continue SSI and CBG s.hba1c 6.1.  8]hypothyroidsm- free t4 low..tsh elevated.  Will not change the dose of an acute setting.  9] status post acute hypoxic respiratory failure secondary to aspiration-appreciate PCCM input.  Continue Unasyn follow-up chest x-ray.  Patient stable and improving.  10] cycles patient will have diagnostic therapeutic paracentesis done.  11] new onset ascites- ?  Secondary to lymphoma status post paracentesis 3.3 L fluid studies.  Does not appear infected.       DVT prophylaxis: SCD Code Status DNR Family Communication: None Disposition Plan: TBD  Consultants: PCCM   Procedures: None  Antimicrobials:UNASYN  Subjective: Feels better cough better postnasal drip better had fever last night   Objective: Vitals:   09/03/17 1000 09/03/17 1100 09/03/17 1117 09/03/17 1200  BP: (!) 160/62  (!) 160/64 (!) 150/76  Pulse: 75  75 74  Resp: (!) 22     Temp:  99 F (37.2 C)  99.5 F (37.5 C)  TempSrc:  Oral  Oral  SpO2: 96%  93% 96%  Weight:      Height:        Intake/Output Summary (Last 24 hours) at 09/03/2017 1235 Last data filed at 09/03/2017 0800 Gross per 24 hour  Intake 103 ml  Output 950 ml  Net -847 ml   Filed Weights   09/01/17 0449 09/02/17 0400 09/03/17 0500  Weight: 85.5 kg (188 lb 7.9 oz) 83.7 kg (184 lb 8.4 oz) 81 kg (178 lb 9.2 oz)    Examination:  General exam: Appears calm and comfortable  Respiratory system: Clear to auscultation. Respiratory effort normal. Cardiovascular system: S1 & S2 heard, RRR. No JVD, murmurs, rubs, gallops or clicks. No pedal edema. Gastrointestinal system: Abdomen is nondistended, soft and nontender. No organomegaly or masses felt. Normal bowel sounds heard. Central nervous system: Alert and oriented.  No focal neurological deficits. Extremities: Symmetric 5 x 5 power. Skin: No rashes, lesions or ulcers Psychiatry: Judgement and insight appear normal. Mood & affect appropriate.     Data Reviewed: I have personally reviewed following labs and imaging studies  CBC: Recent Labs  Lab 08/30/17 0852 08/31/17 1437 09/01/17 0407 09/02/17 0616 09/03/17 0335  WBC 3.7* 4.9 2.5* 5.1 4.6  NEUTROABS 3.2 4.5  --  4.5  --   HGB 8.7* 8.5* 7.5* 8.7* 8.3*  HCT 26.6* 24.9* 22.9* 26.0* 24.3*  MCV 86.1 87.7 86.4 86.1 85.9  PLT 97* 112* 81* 82* 161*   Basic Metabolic Panel: Recent Labs  Lab 08/30/17 0852 08/31/17 1437 09/01/17 0407 09/02/17 0616 09/03/17 0335  NA 134* 134* 134* 135 134*  K 4.4 4.6 5.1 4.7 4.5  CL 108 109 109 110 109  CO2 21* 20* 20* 20* 20*  GLUCOSE 141* 199* 256* 131* 139*  BUN 33* 41* 41* 42* 44*  CREATININE 1.48* 1.61* 1.60* 1.63* 1.61*  CALCIUM 8.2* 8.1* 7.9* 8.0* 7.7*  MG 2.1 2.2  --   --   --   PHOS 3.5  --   --   --   --    GFR: Estimated Creatinine Clearance: 33.5 mL/min (A) (by C-G formula based on SCr of 1.61 mg/dL (H)). Liver Function Tests: Recent Labs  Lab 08/30/17 0852 08/31/17 1437  AST 13 16  ALT 8 14  ALKPHOS 64 54  BILITOT <0.2* 0.6  PROT 4.9* 4.8*  ALBUMIN 2.1* 2.3*   No results for input(s): LIPASE, AMYLASE in the last 168 hours. No results for input(s): AMMONIA in the last 168 hours. Coagulation Profile: Recent Labs  Lab 08/31/17 1511  INR 1.06   Cardiac Enzymes: Recent Labs  Lab 08/31/17 1437 08/31/17 2139  CKTOTAL 40 42  CKMB 3.2  --    BNP (last 3 results) No results for input(s): PROBNP in the last 8760 hours. HbA1C: Recent Labs    09/02/17 0330  HGBA1C 6.1*   CBG: Recent Labs  Lab 09/02/17 1226 09/02/17 1715 09/02/17 2116 09/03/17 0815 09/03/17 1143  GLUCAP 153* 112* 189* 146* 176*   Lipid Profile: No results for input(s): CHOL, HDL, LDLCALC, TRIG, CHOLHDL, LDLDIRECT in the last 72 hours. Thyroid  Function Tests: Recent Labs    08/31/17 2141 09/02/17 0616  TSH 13.079*  --   FREET4  --  0.66*   Anemia Panel: No results for input(s): VITAMINB12, FOLATE, FERRITIN, TIBC, IRON, RETICCTPCT in the last 72 hours. Sepsis Labs: Recent Labs  Lab 08/31/17 1451  LATICACIDVEN 1.05    Recent Results (from the past 240 hour(s))  Culture, blood (Routine x 2)     Status: None (Preliminary result)   Collection Time: 08/31/17  2:40 PM  Result Value Ref Range Status   Specimen Description   Final    BLOOD PORTA CATH Performed at [facility], [place] [address] [place], [place] [address]    Special Requests   Final    BOTTLES DRAWN AEROBIC AND ANAEROBIC Blood Culture adequate volume Performed at [place] [address], [place], [place] [address]    Culture   Final    NO GROWTH 2 DAYS Performed at [place] [address] [place], [place] [address]    Report Status PENDING  Incomplete  Culture, blood (Routine x 2)     Status: None (Preliminary result)   Collection Time: 08/31/17  3:04 PM  Result Value Ref Range Status   Specimen Description   Final    BLOOD RIGHT ARM Performed at [place] [address] [place], [place] [address]    Special Requests   Final    BOTTLES DRAWN AEROBIC AND ANAEROBIC Blood Culture adequate volume Performed at [place] [address], [place], [place] [address]    Culture   Final    NO GROWTH 2 DAYS Performed at [place] [address], [place], [place] [address]    Report Status PENDING  Incomplete  Urine culture     Status: Abnormal   Collection Time: 08/31/17  5:36 PM  Result Value Ref Range Status   Specimen Description   Final    URINE, RANDOM Performed at [place] [address], [place], [place] [address]    Special Requests   Final    NONE Performed at [facility], [place] [address], [place], [place] [address]    Culture MULTIPLE SPECIES PRESENT, SUGGEST RECOLLECTION (A)  Final   Report Status 09/02/2017 FINAL  Final  Culture, blood (routine x 2)     Status: None (Preliminary result)   Collection Time: 09/01/17  3:17 PM  Result Value Ref Range Status   Specimen Description   Final    BLOOD RIGHT ANTECUBITAL Performed at [place] [address] [place],  [place] [address]    Special Requests   Final    BOTTLES DRAWN AEROBIC ONLY Blood Culture adequate volume Performed at [place] [address] [place], [place] [address]    Culture   Final    NO GROWTH < 24 HOURS Performed at [place] [address] [place], [place] [address]    Report Status PENDING  Incomplete  MRSA PCR Screening     Status: None   Collection Time: 09/01/17  3:30 PM  Result Value Ref Range Status   MRSA by PCR NEGATIVE NEGATIVE Final    Comment:        The GeneXpert MRSA Assay (FDA approved for NASAL specimens only), is one component of a comprehensive MRSA colonization surveillance program. It is not intended to diagnose MRSA infection nor to guide or monitor treatment for MRSA infections. Performed at [facility], [place] [address], [place], [place] [address]   Culture, blood (routine x 2)     Status: None (Preliminary result)   Collection Time: 09/01/17  3:31 PM  Result Value Ref Range Status   Specimen Description   Final    BLOOD RIGHT HAND Performed at [place] [address], [place], [place] [address]    Special Requests   Final    BOTTLES DRAWN AEROBIC ONLY Blood Culture adequate volume   Culture   Final    NO GROWTH < 24 HOURS Performed at [facility], [place] [address] [place], [place] [address]    Report Status PENDING  Incomplete  Gram stain     Status: None (Preliminary result)   Collection Time: 09/02/17  4:16 PM  Result Value Ref Range Status   Specimen  Description PERITONEAL  Final   Special Requests   Final    NONE Performed at [facility], [place] [address] [place], [place] [address]    Gram Stain   Final    FEW WBC PRESENT, PREDOMINANTLY MONONUCLEAR NO ORGANISMS SEEN    Report Status PENDING  Incomplete         Radiology Studies: Dg Chest 1 View  Result Date: 09/01/2017 CLINICAL DATA:  Congestive heart failure EXAM: CHEST  1 VIEW COMPARISON:  August 31, 2017 FINDINGS: The heart size and mediastinal contours are stable. Right central venous line is identified unchanged. Heart size is enlarged. There is pulmonary edema. Consolidation of left lung base with left pleural effusion is identified. Minimal right pleural effusion is noted. The visualized skeletal structures are stable. IMPRESSION: Congestive heart failure. Bilateral pleural effusions. Consolidation of left lung base underlying pneumonia is not excluded. Electronically Signed   By: Abelardo Diesel M.D.   On: 09/01/2017 15:25   Dg Abd 1 View  Result Date: 09/01/2017 CLINICAL DATA:  Congestive heart failure, diabetes and vasculitis. EXAM: ABDOMEN - 1 VIEW COMPARISON:  None. FINDINGS: There is no bowel obstruction or free air. Consolidation left lung base is identified. There bilateral pleural effusions. IMPRESSION: No bowel obstruction or free air. Consolidation left lung base identified. Electronically Signed   By: Abelardo Diesel M.D.   On: 09/01/2017 15:26   US Paracentesis  Result Date: 09/02/2017 INDICATION: Patient with history of CHF, anasarca, myelodysplastic syndrome, lymphoplasmacytic lymphoma, ascites. Request made for diagnostic and therapeutic paracentesis. EXAM: ULTRASOUND GUIDED DIAGNOSTIC AND THERAPEUTIC PARACENTESIS MEDICATIONS: None COMPLICATIONS: None immediate. PROCEDURE: Informed written consent was obtained from the patient after a discussion of the risks, benefits and alternatives to treatment. A  timeout was performed prior to the initiation of the  procedure. Initial ultrasound scanning demonstrates a moderate amount of ascites within the left mid to lower abdominal quadrant. The left mid to lower abdomen was prepped and draped in the usual sterile fashion. 1% lidocaine was used for local anesthesia. Following this, a 19 gauge, 7-cm, Yueh catheter was introduced. An ultrasound image was saved for documentation purposes. The paracentesis was performed. The catheter was removed and a dressing was applied. The patient tolerated the procedure well without immediate post procedural complication. FINDINGS: A total of approximately 3.3 liters of hazy, light yellow fluid was removed. Samples were sent to the laboratory as requested by the clinical team. IMPRESSION: Successful ultrasound-guided diagnostic and therapeutic paracentesis yielding 3.3 liters of peritoneal fluid. Read by: Rowe Robert, PA-C Electronically Signed   By: Lucrezia Europe M.D.   On: 09/02/2017 16:20        Scheduled Meds: . amLODipine  10 mg Oral Daily  . carvedilol  25 mg Oral BID  . chlorhexidine  15 mL Mouth Rinse BID  . Chlorhexidine Gluconate Cloth  6 each Topical Daily  . diltiazem  90 mg Oral Q8H  . famotidine  20 mg Oral BID  . feeding supplement (GLUCERNA SHAKE)  237 mL Oral TID BM  . ferrous sulfate  325 mg Oral BID WC  . fluticasone  1 spray Each Nare Daily  . insulin aspart  0-9 Units Subcutaneous TID WC  . ipratropium-albuterol  3 mL Nebulization TID  . levothyroxine  224 mcg Oral QAC breakfast  . mouth rinse  15 mL Mouth Rinse q12n4p  . sodium chloride flush  10-40 mL Intracatheter Q12H   Continuous Infusions: . ampicillin-sulbactam (UNASYN) IV Stopped (09/03/17 0925)     LOS: 3 days    Georgette Shell, MD [facility]  If 7PM-7AM, please contact night-coverage [email or web address] Password [facility] 09/03/2017, 12:35 PM

## 2017-09-04 LAB — BASIC METABOLIC PANEL
Anion gap: 4 — ABNORMAL LOW (ref 5–15)
BUN: 44 mg/dL — AB (ref 6–20)
CO2: 21 mmol/L — ABNORMAL LOW (ref 22–32)
CREATININE: 1.51 mg/dL — AB (ref 0.44–1.00)
Calcium: 7.6 mg/dL — ABNORMAL LOW (ref 8.9–10.3)
Chloride: 107 mmol/L (ref 101–111)
GFR calc Af Amer: 39 mL/min — ABNORMAL LOW (ref >60)
GFR, EST NON AFRICAN AMERICAN: 34 mL/min — AB (ref >60)
Glucose, Bld: 159 mg/dL — ABNORMAL HIGH (ref 65–99)
POTASSIUM: 4.7 mmol/L (ref 3.5–5.1)
Sodium: 132 mmol/L — ABNORMAL LOW (ref 135–145)

## 2017-09-04 LAB — CBC
HCT: 27.8 % — ABNORMAL LOW (ref 36.0–46.0)
Hemoglobin: 9.3 g/dL — ABNORMAL LOW (ref 12.0–15.0)
MCH: 28.7 pg (ref 26.0–34.0)
MCHC: 33.5 g/dL (ref 30.0–36.0)
MCV: 85.8 fL (ref 78.0–100.0)
PLATELETS: 86 10*3/uL — AB (ref 150–400)
RBC: 3.24 MIL/uL — AB (ref 3.87–5.11)
RDW: 13.8 % (ref 11.5–15.5)
WBC: 3.1 10*3/uL — ABNORMAL LOW (ref 4.0–10.5)

## 2017-09-04 LAB — GLUCOSE, CAPILLARY
Glucose-Capillary: 168 mg/dL — ABNORMAL HIGH (ref 65–99)
Glucose-Capillary: 132 mg/dL — ABNORMAL HIGH (ref 65–99)
Glucose-Capillary: 132 mg/dL — ABNORMAL HIGH (ref 65–99)

## 2017-09-04 MED ORDER — DILTIAZEM HCL 60 MG PO TABS
120.0000 mg | ORAL_TABLET | Freq: Three times a day (TID) | ORAL | Status: DC
  Administered 2017-09-04 – 2017-09-06 (×7): 120 mg via ORAL
  Filled 2017-09-04 (×6): qty 2

## 2017-09-04 MED ORDER — HYDRALAZINE HCL 50 MG PO TABS
100.0000 mg | ORAL_TABLET | Freq: Three times a day (TID) | ORAL | Status: DC
  Administered 2017-09-04 – 2017-09-06 (×7): 100 mg via ORAL
  Filled 2017-09-04 (×7): qty 2

## 2017-09-04 MED ORDER — IPRATROPIUM-ALBUTEROL 0.5-2.5 (3) MG/3ML IN SOLN
3.0000 mL | Freq: Two times a day (BID) | RESPIRATORY_TRACT | Status: DC
  Administered 2017-09-05 – 2017-09-06 (×4): 3 mL via RESPIRATORY_TRACT
  Filled 2017-09-04 (×4): qty 3

## 2017-09-04 NOTE — Progress Notes (Signed)
PROGRESS NOTE    Emily Kennedy  YBW:[id number] DOB: Sep 03, 1945 DOA: 08/31/2017 PCP: [name], MD  Brief Narrative: 72 y.o.femalewith medical history significant ofHTN, HLD,CHF, DM type II, vasculitis, and malignant lymphoma currently receiving chemotherapy followed by [name] of oncology; who presents with complaints of fever. History is mostly obtained from the patient's daughter as patient defers to her arm. Over the last several weeks patient has had abdominal and bilateral leg swelling, but in the last week had progressively worsened to the point which patient was having difficulty ambulating. She was seen at the cancer center yesterday where she received chemotherapy of bendamustine andrituximab. Patient was also given Lasix 40 mg IV due to swelling.She had not been taking torsemide.Since yesterday thepatient reports only having urinated once since yesterday. Today patient reported acute onset of sharp bilateral flank pain(worse on the right flank) and abdominal pain. Temperature at home was noted to be up to around 101.5 F. Patient tried taking Tylenol and tramadol without relief of symptoms around noon. In addition to the leg swelling she noticed some mild redness of the legs and generalized malaise. Denies any significant shortness of breath, cough, chest pain, nausea, vomiting, dysuria, ordiarrhea.Per review of records it appears en routewith EMS patient was noted to be febrile up to 104.2 F and was given1000mg  ofTylenol prior to arrival.  ED Course:Upon admission into the emergency department patient was seen to befebrile up to 100.4 F, pulse 85-98, respirations 17-25, blood pressures upto 185/90,andO2 saturation 92-99% on room air.Labs revealed WBC 4.9, globin 8.5, platelets 112, sodium 134, BUN 41, creatinine 1.61, albumin 2.3, lactic acid 1.05, and INR 1.06. Urinalysis was positive for signs of possible infection and blood. CT scan of the abdomen  showed moderate ascites with generalized anasarca and small left pleural and pericardial effusion similar to previous studies. Patient was given empiric antibiotics of Zosyn and 2 L normal saline IV fluids     Assessment & Plan:   Principal Problem:   UTI (urinary tract infection) Active Problems:   Anemia, chronic disease   Essential hypertension   Chronic diastolic CHF (congestive heart failure) (HCC)   Controlled diabetes mellitus type 2 with complications (HCC)   Anasarca   Thrombocytopenia (HCC)   Malignant lymphoma (HCC)   Fever  1] aspiration pneumonia no temperature spikes overnight.  Patient is on 2 L of oxygen at this time.  She is feeling better.  She reports her cough is better postnasal drip is better.  Incentive spirometry.  Will switch to p.o. Augmentin tomorrow.  2] UTI patient admitted with a diagnosis of UTI urine culture report shows multi species present.  Suggesting collection.  Patient was on Unasyn for many days now will continue her now the course.  3]Lymphoma  status post chemotherapy 08/30/2017.  4]HTN continue norvasc,coreg,cardizem.  5]dm not on any meds at home.ssi will follow.hba1c tomorrow.  6] CHF ejection fraction -55 to 60% by echo 09/02/2017.  7] AKI mild improvement.  8] hypothyroidism continue synthroid  9] ascites status post paracentesis 3.3 L removed yesterday   DVT prophylaxis:scd Code Status:dnr Family Communication:none Disposition Plan tbd Consultants: pccm  Procedures:none Antimicrobials: unasyn Subjective:feels better.   Objective: Vitals:   09/03/17 1943 09/03/17 2017 09/04/17 0527 09/04/17 0921  BP:  (!) 161/78 (!) 153/69   Pulse:  82 74   Resp:  (!) 24 20   Temp:  98.7 F (37.1 C) 98.4 F (36.9 C)   TempSrc:  Oral Oral   SpO2: 92% 94% 96%  94%  Weight:   82.7 kg (182 lb 5.1 oz)   Height:        Intake/Output Summary (Last 24 hours) at 09/04/2017 1044 Last data filed at 09/04/2017 0700 Gross per 24 hour    Intake 1186.66 ml  Output 325 ml  Net 861.66 ml   Filed Weights   09/02/17 0400 09/03/17 0500 09/04/17 0527  Weight: 83.7 kg (184 lb 8.4 oz) 81 kg (178 lb 9.2 oz) 82.7 kg (182 lb 5.1 oz)    Examination:  General exam: Appears calm and comfortable  Respiratory system: Clear to auscultation. Respiratory effort normal. Cardiovascular system: S1 & S2 heard, RRR. No JVD, murmurs, rubs, gallops or clicks. No pedal edema. Gastrointestinal system: Abdomen is nondistended, soft and nontender. No organomegaly or masses felt. Normal bowel sounds heard. Central nervous system: Alert and oriented. No focal neurological deficits. Extremities: Symmetric 5 x 5 power. Skin: No rashes, lesions or ulcers Psychiatry: Judgement and insight appear normal. Mood & affect appropriate.     Data Reviewed: I have personally reviewed following labs and imaging studies  CBC: Recent Labs  Lab 08/30/17 0852 08/31/17 1437 09/01/17 0407 09/02/17 0616 09/03/17 0335 09/04/17 0308  WBC 3.7* 4.9 2.5* 5.1 4.6 3.1*  NEUTROABS 3.2 4.5  --  4.5  --   --   HGB 8.7* 8.5* 7.5* 8.7* 8.3* 9.3*  HCT 26.6* 24.9* 22.9* 26.0* 24.3* 27.8*  MCV 86.1 87.7 86.4 86.1 85.9 85.8  PLT 97* 112* 81* 82* 103* 86*   Basic Metabolic Panel: Recent Labs  Lab 08/30/17 0852 08/31/17 1437 09/01/17 0407 09/02/17 0616 09/03/17 0335 09/04/17 0308  NA 134* 134* 134* 135 134* 132*  K 4.4 4.6 5.1 4.7 4.5 4.7  CL 108 109 109 110 109 107  CO2 21* 20* 20* 20* 20* 21*  GLUCOSE 141* 199* 256* 131* 139* 159*  BUN 33* 41* 41* 42* 44* 44*  CREATININE 1.48* 1.61* 1.60* 1.63* 1.61* 1.51*  CALCIUM 8.2* 8.1* 7.9* 8.0* 7.7* 7.6*  MG 2.1 2.2  --   --   --   --   PHOS 3.5  --   --   --   --   --    GFR: Estimated Creatinine Clearance: 36.1 mL/min (A) (by C-G formula based on SCr of 1.51 mg/dL (H)). Liver Function Tests: Recent Labs  Lab 08/30/17 0852 08/31/17 1437  AST 13 16  ALT 8 14  ALKPHOS 64 54  BILITOT <0.2* 0.6  PROT 4.9*  4.8*  ALBUMIN 2.1* 2.3*   No results for input(s): LIPASE, AMYLASE in the last 168 hours. No results for input(s): AMMONIA in the last 168 hours. Coagulation Profile: Recent Labs  Lab 08/31/17 1511  INR 1.06   Cardiac Enzymes: Recent Labs  Lab 08/31/17 1437 08/31/17 2139  CKTOTAL 40 42  CKMB 3.2  --    BNP (last 3 results) No results for input(s): PROBNP in the last 8760 hours. HbA1C: Recent Labs    09/02/17 0330  HGBA1C 6.1*   CBG: Recent Labs  Lab 09/02/17 2116 09/03/17 0815 09/03/17 1143 09/03/17 1708 09/03/17 2013  GLUCAP 189* 146* 176* 145* 202*   Lipid Profile: No results for input(s): CHOL, HDL, LDLCALC, TRIG, CHOLHDL, LDLDIRECT in the last 72 hours. Thyroid Function Tests: Recent Labs    09/02/17 0616  FREET4 0.66*   Anemia Panel: No results for input(s): VITAMINB12, FOLATE, FERRITIN, TIBC, IRON, RETICCTPCT in the last 72 hours. Sepsis Labs: Recent Labs  Lab 08/31/17 1451  LATICACIDVEN  1.05    Recent Results (from the past 240 hour(s))  Culture, blood (Routine x 2)     Status: None (Preliminary result)   Collection Time: 08/31/17  2:40 PM  Result Value Ref Range Status   Specimen Description   Final    BLOOD PORTA CATH Performed at [facility] [address] [place], [place] [address]    Special Requests   Final    BOTTLES DRAWN AEROBIC AND ANAEROBIC Blood Culture adequate volume Performed at [facility] [address] [place], [place] [address]    Culture   Final    NO GROWTH 3 DAYS Performed at [facility], [place] [address], [place], [place] [address]    Report Status PENDING  Incomplete  Culture, blood (Routine x 2)     Status: None (Preliminary result)   Collection Time: 08/31/17  3:04 PM  Result Value Ref Range Status   Specimen Description   Final    BLOOD RIGHT ARM Performed at [facility] [address], [place], [place] [address]    Special Requests    Final    BOTTLES DRAWN AEROBIC AND ANAEROBIC Blood Culture adequate volume Performed at [facility] [address], [place], [place] [address]    Culture   Final    NO GROWTH 3 DAYS Performed at [facility], [place] [address], [place], [place] [address]    Report Status PENDING  Incomplete  Urine culture     Status: Abnormal   Collection Time: 08/31/17  5:36 PM  Result Value Ref Range Status   Specimen Description   Final    URINE, RANDOM Performed at [facility] [address] [place], [place] [address]    Special Requests   Final    NONE Performed at [facility], [place] [address], [place], [place] [address]    Culture MULTIPLE SPECIES PRESENT, SUGGEST RECOLLECTION (A)  Final   Report Status 09/02/2017 FINAL  Final  Culture, blood (routine x 2)     Status: None (Preliminary result)   Collection Time: 09/01/17  3:17 PM  Result Value Ref Range Status   Specimen Description   Final    BLOOD RIGHT ANTECUBITAL Performed at [facility] [address] [place], [place] [address]    Special Requests   Final    BOTTLES DRAWN AEROBIC ONLY Blood Culture adequate volume Performed at [facility] [address] [place], [place] [address]    Culture   Final    NO GROWTH 2 DAYS Performed at [facility] [address] [place], [place] [address]    Report Status PENDING  Incomplete  MRSA PCR Screening     Status: None   Collection Time: 09/01/17  3:30 PM  Result Value Ref Range Status   MRSA by PCR NEGATIVE NEGATIVE Final    Comment:        The GeneXpert MRSA Assay (FDA approved for NASAL specimens only), is one component of a comprehensive MRSA colonization surveillance program. It is not intended to diagnose MRSA infection nor to guide or monitor treatment for MRSA infections. Performed at [facility], [place] [address], [place], [place]  [address]   Culture, blood (routine x 2)     Status: None (Preliminary result)   Collection Time: 09/01/17  3:31 PM  Result Value Ref Range Status   Specimen Description   Final  BLOOD RIGHT HAND Performed at [facility], [place] [address] [place], [place] [address]    Special Requests   Final    BOTTLES DRAWN AEROBIC ONLY Blood Culture adequate volume   Culture   Final    NO GROWTH 2 DAYS Performed at [facility], [place] [address] [place], [place] [address]    Report Status PENDING  Incomplete  Culture, body fluid-bottle     Status: None (Preliminary result)   Collection Time: 09/02/17  4:16 PM  Result Value Ref Range Status   Specimen Description PERITONEAL  Final   Special Requests NONE  Final   Culture   Final    NO GROWTH < 24 HOURS Performed at [facility], [place] [address] [place], [place] [address]    Report Status PENDING  Incomplete  Gram stain     Status: None (Preliminary result)   Collection Time: 09/02/17  4:16 PM  Result Value Ref Range Status   Specimen Description PERITONEAL  Final   Special Requests   Final    NONE Performed at [facility], [place] [address] [place], [place] [address]    Gram Stain   Final    FEW WBC PRESENT, PREDOMINANTLY MONONUCLEAR NO ORGANISMS SEEN    Report Status PENDING  Incomplete         Radiology Studies: US Paracentesis  Result Date: 09/02/2017 INDICATION: Patient with history of CHF, anasarca, myelodysplastic syndrome, lymphoplasmacytic lymphoma, ascites. Request made for diagnostic and therapeutic paracentesis. EXAM: ULTRASOUND GUIDED DIAGNOSTIC AND THERAPEUTIC PARACENTESIS MEDICATIONS: None COMPLICATIONS: None immediate. PROCEDURE: Informed written consent was obtained from the patient after a discussion of the risks, benefits and alternatives to treatment. A timeout was performed prior to the initiation of the procedure. Initial ultrasound scanning demonstrates a moderate amount of  ascites within the left mid to lower abdominal quadrant. The left mid to lower abdomen was prepped and draped in the usual sterile fashion. 1% lidocaine was used for local anesthesia. Following this, a 19 gauge, 7-cm, Yueh catheter was introduced. An ultrasound image was saved for documentation purposes. The paracentesis was performed. The catheter was removed and a dressing was applied. The patient tolerated the procedure well without immediate post procedural complication. FINDINGS: A total of approximately 3.3 liters of hazy, light yellow fluid was removed. Samples were sent to the laboratory as requested by the clinical team. IMPRESSION: Successful ultrasound-guided diagnostic and therapeutic paracentesis yielding 3.3 liters of peritoneal fluid. Read by: Rowe Robert, PA-C Electronically Signed   By: Lucrezia Europe M.D.   On: 09/02/2017 16:20        Scheduled Meds: . amLODipine  10 mg Oral Daily  . carvedilol  25 mg Oral BID  . chlorhexidine  15 mL Mouth Rinse BID  . Chlorhexidine Gluconate Cloth  6 each Topical Daily  . diltiazem  90 mg Oral Q8H  . famotidine  20 mg Oral BID  . feeding supplement (GLUCERNA SHAKE)  237 mL Oral TID BM  . ferrous sulfate  325 mg Oral BID WC  . fluticasone  1 spray Each Nare Daily  . insulin aspart  0-9 Units Subcutaneous TID WC  . ipratropium-albuterol  3 mL Nebulization TID  . levothyroxine  224 mcg Oral QAC breakfast  . mouth rinse  15 mL Mouth Rinse q12n4p  . sodium chloride flush  10-40 mL Intracatheter Q12H   Continuous Infusions: . ampicillin-sulbactam (UNASYN) IV 1.5 g (09/04/17 0916)     LOS: 4  days    Georgette Shell, MD [facility] If 7PM-7AM, please contact night-coverage [email or web address] Password [email or web address] 09/04/2017, 10:44 AM

## 2017-09-05 ENCOUNTER — Encounter: Payer: Medicare Other | Admitting: Nutrition

## 2017-09-05 LAB — CULTURE, BLOOD (ROUTINE X 2)
CULTURE: NO GROWTH
Culture: NO GROWTH
SPECIAL REQUESTS: ADEQUATE
Special Requests: ADEQUATE

## 2017-09-05 LAB — HEMOGLOBIN A1C
HEMOGLOBIN A1C: 6.2 % — AB (ref 4.8–5.6)
MEAN PLASMA GLUCOSE: 131.24 mg/dL

## 2017-09-05 LAB — GLUCOSE, CAPILLARY
GLUCOSE-CAPILLARY: 157 mg/dL — AB (ref 65–99)
GLUCOSE-CAPILLARY: 124 mg/dL — AB (ref 65–99)
Glucose-Capillary: 118 mg/dL — ABNORMAL HIGH (ref 65–99)

## 2017-09-05 LAB — BASIC METABOLIC PANEL
Anion gap: 6 (ref 5–15)
BUN: 44 mg/dL — AB (ref 6–20)
CALCIUM: 7.7 mg/dL — AB (ref 8.9–10.3)
CO2: 21 mmol/L — ABNORMAL LOW (ref 22–32)
CREATININE: 1.41 mg/dL — AB (ref 0.44–1.00)
Chloride: 107 mmol/L (ref 101–111)
GFR calc non Af Amer: 37 mL/min — ABNORMAL LOW (ref >60)
GFR, EST AFRICAN AMERICAN: 43 mL/min — AB (ref >60)
Glucose, Bld: 146 mg/dL — ABNORMAL HIGH (ref 65–99)
Potassium: 4.7 mmol/L (ref 3.5–5.1)
SODIUM: 134 mmol/L — AB (ref 135–145)

## 2017-09-05 LAB — CBC
HCT: 26.3 % — ABNORMAL LOW (ref 36.0–46.0)
HEMOGLOBIN: 8.9 g/dL — AB (ref 12.0–15.0)
MCH: 29.3 pg (ref 26.0–34.0)
MCHC: 33.8 g/dL (ref 30.0–36.0)
MCV: 86.5 fL (ref 78.0–100.0)
PLATELETS: 116 10*3/uL — AB (ref 150–400)
RBC: 3.04 MIL/uL — ABNORMAL LOW (ref 3.87–5.11)
RDW: 13.8 % (ref 11.5–15.5)
WBC: 3.8 10*3/uL — ABNORMAL LOW (ref 4.0–10.5)

## 2017-09-05 LAB — GRAM STAIN

## 2017-09-05 MED ORDER — ENOXAPARIN SODIUM 40 MG/0.4ML ~~LOC~~ SOLN
40.0000 mg | SUBCUTANEOUS | Status: DC
  Administered 2017-09-05: 40 mg via SUBCUTANEOUS
  Filled 2017-09-05: qty 0.4

## 2017-09-05 MED ORDER — OXYCODONE HCL 5 MG PO TABS
10.0000 mg | ORAL_TABLET | ORAL | Status: DC | PRN
  Administered 2017-09-05 (×2): 10 mg via ORAL
  Filled 2017-09-05 (×2): qty 2

## 2017-09-05 MED ORDER — AMOXICILLIN-POT CLAVULANATE 875-125 MG PO TABS
1.0000 | ORAL_TABLET | Freq: Two times a day (BID) | ORAL | Status: DC
  Administered 2017-09-05 – 2017-09-06 (×3): 1 via ORAL
  Filled 2017-09-05 (×3): qty 1

## 2017-09-05 NOTE — Progress Notes (Signed)
Physical Therapy Treatment Patient Details Name: Emily Kennedy MRN: [id number] DOB: 30-Mar-1945 Today's Date: 09/05/2017    History of Present Illness 72 year old woman with a history of hypertension, diastolic CHF, diabetes, RA, lymphoplasmacytic lymphoma. She was admitted 6/19 with fever, bilateral flank pain, acute renal insufficiency, and acute urinary tract infection.  A CT of her abdomen showed volume overload, anasarca with ascites, a small left effusion and no evidence of ureteral obstruction.      PT Comments    Max assist for supine to sit and to pivot to recliner. Pt unable to achieve full standing position with RW with 2 trials. Pt reported 10/10 B knee pain with movement, pain meds requested.   Follow Up Recommendations  SNF;Supervision/Assistance - 24 hour     Equipment Recommendations  None recommended by PT    Recommendations for Other Services       Precautions / Restrictions Precautions Precautions: Fall Restrictions Weight Bearing Restrictions: No    Mobility  Bed Mobility Overal bed mobility: Needs Assistance Bed Mobility: Supine to Sit     Supine to sit: Mod assist;+2 for safety/equipment;+2 for physical assistance     General bed mobility comments: assist for trunk and legs  Transfers Overall transfer level: Needs assistance Equipment used: Rolling walker (2 wheeled);2 person hand held assist Transfers: Sit to/from Stand;Stand Pivot Transfers Sit to Stand: +2 physical assistance;Max assist Stand pivot transfers: Max assist;+2 physical assistance       General transfer comment: assist to rise and to steady, unable to come to full stand with RW with 2 trials. So did side by side pivot transfer to recliner, +2 max assist  Ambulation/Gait                 Stairs             Wheelchair Mobility    Modified Rankin (Stroke Patients Only)       Balance Overall balance assessment: Needs assistance Sitting-balance support: Feet  supported Sitting balance-Leahy Scale: Fair     Standing balance support: Bilateral upper extremity supported Standing balance-Leahy Scale: Zero                              Cognition Arousal/Alertness: Awake/alert Behavior During Therapy: WFL for tasks assessed/performed Overall Cognitive Status: Within Functional Limits for tasks assessed                                        Exercises General Exercises - Lower Extremity Ankle Circles/Pumps: AAROM;Both;10 reps;Supine Heel Slides: AAROM;Both;10 reps;Supine    General Comments        Pertinent Vitals/Pain Pain Assessment: 0-10 Pain Score: 10-Worst pain ever Pain Location: B knees with movement Pain Descriptors / Indicators: Sore Pain Intervention(s): Limited activity within patient's tolerance;Monitored during session;Repositioned;Patient requesting pain meds-RN notified    Home Living                      Prior Function            PT Goals (current goals can now be found in the care plan section) Acute Rehab PT Goals Patient Stated Goal: agreeable to SNF when medically stable PT Goal Formulation: With patient/family Time For Goal Achievement: 09/16/17 Potential to Achieve Goals: Fair    Frequency    Min 2X/week  PT Plan Current plan remains appropriate    Co-evaluation              AM-PAC PT "6 Clicks" Daily Activity  Outcome Measure  Difficulty turning over in bed (including adjusting bedclothes, sheets and blankets)?: Unable Difficulty moving from lying on back to sitting on the side of the bed? : Unable Difficulty sitting down on and standing up from a chair with arms (e.g., wheelchair, bedside commode, etc,.)?: Unable Help needed moving to and from a bed to chair (including a wheelchair)?: Total Help needed walking in hospital room?: Total Help needed climbing 3-5 steps with a railing? : Total 6 Click Score: 6    End of Session Equipment Utilized  During Treatment: Gait belt;Oxygen Activity Tolerance: Patient limited by fatigue Patient left: with call bell/phone within reach;in chair;with nursing/sitter in room Nurse Communication: Mobility status(pt/bed soiled in urine from leaking purewick) PT Visit Diagnosis: Other abnormalities of gait and mobility (R26.89);Muscle weakness (generalized) (M62.81)     Time: 1410-1436 PT Time Calculation (min) (ACUTE ONLY): 26 min  Charges:  $Therapeutic Activity: 23-37 mins                    G Codes:          Emily Kennedy 09/05/2017, 2:47 PM [phone]

## 2017-09-05 NOTE — Care Management Important Message (Signed)
Important Message  Patient Details  Name: Kamayah Pillay MRN: [id number] Date of Birth: 02-12-1946   Medicare Important Message Given:  Yes    Kerin Salen 09/05/2017, 12:46 [place] Message  Patient Details  Name: Sundeep Cary MRN: [id number] Date of Birth: [birth year]   Medicare Important Message Given:  Yes    Kerin Salen 09/05/2017, 12:46 PM

## 2017-09-05 NOTE — Plan of Care (Signed)
  Problem: Coping: Goal: Level of anxiety will decrease Outcome: Progressing   Problem: Elimination: Goal: Will not experience complications related to bowel motility Outcome: Progressing Goal: Will not experience complications related to urinary retention Outcome: Progressing   Problem: Pain Managment: Goal: General experience of comfort will improve Outcome: Progressing   Problem: Skin Integrity: Goal: Risk for impaired skin integrity will decrease Outcome: Progressing   

## 2017-09-05 NOTE — Progress Notes (Signed)
PROGRESS NOTE    Emily Kennedy  TOI:[id number] DOB: [birth year] DOA: 08/31/2017 PCP: [name], MD  Brief Narrative:72 y.o.femalewith medical history significant ofHTN, HLD,CHF, DM type II, vasculitis, and malignant lymphoma currently receiving chemotherapy followed by [name] of oncology; who presents with complaints of fever. History is mostly obtained from the patient's daughter as patient defers to her arm. Over the last several weeks patient has had abdominal and bilateral leg swelling, but in the last week had progressively worsened to the point which patient was having difficulty ambulating. She was seen at the cancer center yesterday where she received chemotherapy of bendamustine andrituximab. Patient was also given Lasix 40 mg IV due to swelling.She had not been taking torsemide.Since yesterday thepatient reports only having urinated once since yesterday. Today patient reported acute onset of sharp bilateral flank pain(worse on the right flank) and abdominal pain. Temperature at home was noted to be up to around 101.5 F. Patient tried taking Tylenol and tramadol without relief of symptoms around noon. In addition to the leg swelling she noticed some mild redness of the legs and generalized malaise. Denies any significant shortness of breath, cough, chest pain, nausea, vomiting, dysuria, ordiarrhea.Per review of records it appears en routewith EMS patient was noted to be febrile up to 104.2 F and was given1000mg  ofTylenol prior to arrival.     Assessment & Plan:   Principal Problem:   UTI (urinary tract infection) Active Problems:   Anemia, chronic disease   Essential hypertension   Chronic diastolic CHF (congestive heart failure) (HCC)   Controlled diabetes mellitus type 2 with complications (HCC)   Anasarca   Thrombocytopenia (HCC)   Malignant lymphoma (HCC)   Fever 1] aspiration pneumonia no temperature spikes overnight.  Patient is on 2 L of oxygen  at this time.  She is feeling better.  She reports her cough is better postnasal drip is better.  Incentive spirometry.  Will switch to p.o. Augmentin tomorrow.  2] UTI patient admitted with a diagnosis of UTI urine culture report shows multi species present.  Suggesting collection.  DC Unasyn  3]Lymphoma  status post chemotherapy 08/30/2017.  4]HTN continue norvasc,coreg,cardizem.  5]dm not on any meds at home.ssi will follow.hba1c 6.2.  6] CHF ejection fraction -55 to 60% by echo 09/02/2017.lasix prn.  7] AKI mild improvement.  8] hypothyroidism continue synthroid  9] ascites status post paracentesis 3.3 L removed 6/22        DVT prophylaxis: lovenox Code Status: dnr Family Communication:none  Disposition Plan:to snf bed available. Consultants:  Oncology  Procedures: paracentesis  Antimicrobialsaugmentin  Subjective: C/o b/l lower extremity pain...has some swelling.  Objective: Vitals:   09/05/17 0500 09/05/17 0559 09/05/17 0753 09/05/17 1349  BP:  (!) 155/74  (!) 154/75  Pulse:  77 74 77  Resp:  16 18 16   Temp:  98.5 F (36.9 C)  98.3 F (36.8 C)  TempSrc:  Oral  Oral  SpO2:  99% 94% 93%  Weight: 83.1 kg (183 lb 3.2 oz)     Height:        Intake/Output Summary (Last 24 hours) at 09/05/2017 1457 Last data filed at 09/05/2017 0900 Gross per 24 hour  Intake 1143.33 ml  Output 400 ml  Net 743.33 ml   Filed Weights   09/03/17 0500 09/04/17 0527 09/05/17 0500  Weight: 81 kg (178 lb 9.2 oz) 82.7 kg (182 lb 5.1 oz) 83.1 kg (183 lb 3.2 oz)    Examination:  General exam: Appears calm and  comfortable  Respiratory system: rhonchi  to auscultation. Respiratory effort normal. Cardiovascular system: S1 & S2 heard, RRR. No JVD, murmurs, rubs, gallops or clicks. No pedal edema. Gastrointestinal system: Abdomen is nondistended, soft and nontender. No organomegaly or masses felt. Normal bowel sounds heard. Central nervous system: Alert and oriented. No  focal neurological deficits. Extremities:trace edema b/l Skin: No rashes, lesions or ulcers Psychiatry: Judgement and insight appear normal. Mood & affect appropriate.     Data Reviewed: I have personally reviewed following labs and imaging studies  CBC: Recent Labs  Lab 08/30/17 0852  08/31/17 1437 09/01/17 0407 09/02/17 0616 09/03/17 0335 09/04/17 0308 09/05/17 0314  WBC 3.7*   < > 4.9 2.5* 5.1 4.6 3.1* 3.8*  NEUTROABS 3.2  --  4.5  --  4.5  --   --   --   HGB 8.7*   < > 8.5* 7.5* 8.7* 8.3* 9.3* 8.9*  HCT 26.6*  --  24.9* 22.9* 26.0* 24.3* 27.8* 26.3*  MCV 86.1  --  87.7 86.4 86.1 85.9 85.8 86.5  PLT 97*   < > 112* 81* 82* 103* 86* 116*   < > = values in this interval not displayed.   Basic Metabolic Panel: Recent Labs  Lab 08/30/17 0852  08/31/17 1437 09/01/17 0407 09/02/17 0616 09/03/17 0335 09/04/17 0308 09/05/17 0314  NA 134*  --  134* 134* 135 134* 132* 134*  K 4.4  --  4.6 5.1 4.7 4.5 4.7 4.7  CL 108  --  109 109 110 109 107 107  CO2 21*  --  20* 20* 20* 20* 21* 21*  GLUCOSE 141*  --  199* 256* 131* 139* 159* 146*  BUN 33*  --  41* 41* 42* 44* 44* 44*  CREATININE 1.48*   < > 1.61* 1.60* 1.63* 1.61* 1.51* 1.41*  CALCIUM 8.2*  --  8.1* 7.9* 8.0* 7.7* 7.6* 7.7*  MG 2.1  --  2.2  --   --   --   --   --   PHOS 3.5  --   --   --   --   --   --   --    < > = values in this interval not displayed.   GFR: Estimated Creatinine Clearance: 38.7 mL/min (A) (by C-G formula based on SCr of 1.41 mg/dL (H)). Liver Function Tests: Recent Labs  Lab 08/30/17 0852 08/31/17 1437  AST 13 16  ALT 8 14  ALKPHOS 64 54  BILITOT <0.2* 0.6  PROT 4.9* 4.8*  ALBUMIN 2.1* 2.3*   No results for input(s): LIPASE, AMYLASE in the last 168 hours. No results for input(s): AMMONIA in the last 168 hours. Coagulation Profile: Recent Labs  Lab 08/31/17 1511  INR 1.06   Cardiac Enzymes: Recent Labs  Lab 08/31/17 1437 08/31/17 2139  CKTOTAL 40 42  CKMB 3.2  --    BNP (last  3 results) No results for input(s): PROBNP in the last 8760 hours. HbA1C: Recent Labs    09/05/17 0314  HGBA1C 6.2*   CBG: Recent Labs  Lab 09/03/17 2013 09/04/17 1138 09/04/17 1651 09/04/17 2120 09/05/17 1131  GLUCAP 202* 168* 132* 132* 157*   Lipid Profile: No results for input(s): CHOL, HDL, LDLCALC, TRIG, CHOLHDL, LDLDIRECT in the last 72 hours. Thyroid Function Tests: No results for input(s): TSH, T4TOTAL, FREET4, T3FREE, THYROIDAB in the last 72 hours. Anemia Panel: No results for input(s): VITAMINB12, FOLATE, FERRITIN, TIBC, IRON, RETICCTPCT in the last 72 hours. Sepsis Labs:  Recent Labs  Lab 08/31/17 1451  LATICACIDVEN 1.05    Recent Results (from the past 240 hour(s))  Culture, blood (Routine x 2)     Status: None   Collection Time: 08/31/17  2:40 PM  Result Value Ref Range Status   Specimen Description   Final    BLOOD PORTA CATH Performed at [facility], [place] [address], [place], [place] [address]    Special Requests   Final    BOTTLES DRAWN AEROBIC AND ANAEROBIC Blood Culture adequate volume Performed at [place] [address] [place], [place] [address]    Culture   Final    NO GROWTH 5 DAYS Performed at [facility], [place] [address], [place], [place] [address]    Report Status 09/05/2017 FINAL  Final  Culture, blood (Routine x 2)     Status: None   Collection Time: 08/31/17  3:04 PM  Result Value Ref Range Status   Specimen Description   Final    BLOOD RIGHT ARM Performed at [place] [address], [place], [place] [address]    Special Requests   Final    BOTTLES DRAWN AEROBIC AND ANAEROBIC Blood Culture adequate volume Performed at [place] [address], [place], [place] [address]    Culture   Final    NO GROWTH 5 DAYS Performed at [facility], [place] [address], [place], [place] [address]    Report Status 09/05/2017 FINAL  Final  Urine  culture     Status: Abnormal   Collection Time: 08/31/17  5:36 PM  Result Value Ref Range Status   Specimen Description   Final    URINE, RANDOM Performed at [place] [address] [place], [place] [address]    Special Requests   Final    NONE Performed at [facility], [place] [address], [place], [place] [address]    Culture MULTIPLE SPECIES PRESENT, SUGGEST RECOLLECTION (A)  Final   Report Status 09/02/2017 FINAL  Final  Culture, blood (routine x 2)     Status: None (Preliminary result)   Collection Time: 09/01/17  3:17 PM  Result Value Ref Range Status   Specimen Description   Final    BLOOD RIGHT ANTECUBITAL Performed at [place] [address] [place], [place] [address]    Special Requests   Final    BOTTLES DRAWN AEROBIC ONLY Blood Culture adequate volume Performed at [place] [address] [place], [place] [address]    Culture   Final    NO GROWTH 4 DAYS Performed at [facility], [place] [address] [place],  [address]    Report Status PENDING  Incomplete  MRSA PCR Screening     Status: None   Collection Time: 09/01/17  3:30 PM  Result Value Ref Range Status   MRSA by PCR NEGATIVE NEGATIVE Final    Comment:        The GeneXpert MRSA Assay (FDA approved for NASAL specimens only), is one component of a comprehensive MRSA colonization surveillance program. It is not intended to diagnose MRSA infection nor to guide or monitor treatment for MRSA infections. Performed at [facility], [place] [address] [place],  [address]   Culture, blood (routine x 2)     Status: None (Preliminary result)   Collection Time: 09/01/17  3:31 PM  Result Value Ref Range Status  Specimen Description   Final    BLOOD RIGHT HAND Performed at [place] [address] [place], [place] [address]    Special Requests   Final    BOTTLES DRAWN  AEROBIC ONLY Blood Culture adequate volume   Culture   Final    NO GROWTH 4 DAYS Performed at [facility], [place] [address], [place], [place] [address]    Report Status PENDING  Incomplete  Culture, body fluid-bottle     Status: None (Preliminary result)   Collection Time: 09/02/17  4:16 PM  Result Value Ref Range Status   Specimen Description PERITONEAL  Final   Special Requests NONE  Final   Culture   Final    NO GROWTH 2 DAYS Performed at [facility], [address] [place], [place] [address]    Report Status PENDING  Incomplete  Gram stain     Status: None (Preliminary result)   Collection Time: 09/02/17  4:16 PM  Result Value Ref Range Status   Specimen Description PERITONEAL  Final   Special Requests   Final    NONE Performed at [facility], [place] [address], [place], [place] [address]    Gram Stain   Final    FEW WBC PRESENT, PREDOMINANTLY MONONUCLEAR NO ORGANISMS SEEN    Report Status PENDING  Incomplete         Radiology Studies: No results found.      Scheduled Meds: . amLODipine  10 mg Oral Daily  . carvedilol  25 mg Oral BID  . chlorhexidine  15 mL Mouth Rinse BID  . diltiazem  120 mg Oral Q8H  . famotidine  20 mg Oral BID  . feeding supplement (GLUCERNA SHAKE)  237 mL Oral TID BM  . ferrous sulfate  325 mg Oral BID WC  . fluticasone  1 spray Each Nare Daily  . hydrALAZINE  100 mg Oral Q8H  . insulin aspart  0-9 Units Subcutaneous TID WC  . ipratropium-albuterol  3 mL Nebulization BID  . levothyroxine  224 mcg Oral QAC breakfast  . mouth rinse  15 mL Mouth Rinse q12n4p  . sodium chloride flush  10-40 mL Intracatheter Q12H   Continuous Infusions: . ampicillin-sulbactam (UNASYN) IV Stopped (09/05/17 0902)     LOS: 5 days      Georgette Shell, MD [facility]  If 7PM-7AM, please contact night-coverage [email or web address] Password [facility] 09/05/2017, 2:57 PM

## 2017-09-06 DIAGNOSIS — R140 Abdominal distension (gaseous): Secondary | ICD-10-CM

## 2017-09-06 DIAGNOSIS — R601 Generalized edema: Secondary | ICD-10-CM

## 2017-09-06 DIAGNOSIS — I5032 Chronic diastolic (congestive) heart failure: Secondary | ICD-10-CM

## 2017-09-06 LAB — GLUCOSE, CAPILLARY
GLUCOSE-CAPILLARY: 151 mg/dL — AB (ref 70–99)
GLUCOSE-CAPILLARY: 138 mg/dL — AB (ref 70–99)
GLUCOSE-CAPILLARY: 131 mg/dL — AB (ref 70–99)
GLUCOSE-CAPILLARY: 157 mg/dL — AB (ref 70–99)
GLUCOSE-CAPILLARY: 181 mg/dL — AB (ref 70–99)
Glucose-Capillary: 126 mg/dL — ABNORMAL HIGH (ref 70–99)

## 2017-09-06 LAB — CULTURE, BLOOD (ROUTINE X 2)
CULTURE: NO GROWTH
CULTURE: NO GROWTH
SPECIAL REQUESTS: ADEQUATE
Special Requests: ADEQUATE

## 2017-09-06 MED ORDER — HEPARIN SOD (PORK) LOCK FLUSH 100 UNIT/ML IV SOLN
500.0000 [IU] | INTRAVENOUS | Status: AC | PRN
  Administered 2017-09-06: 500 [IU]

## 2017-09-06 MED ORDER — ONDANSETRON HCL 4 MG PO TABS: 4.0000 mg | ORAL_TABLET | Freq: Four times a day (QID) | ORAL | 0 refills | Status: DC | PRN

## 2017-09-06 MED ORDER — ALBUTEROL SULFATE (2.5 MG/3ML) 0.083% IN NEBU: 2.5000 mg | INHALATION_SOLUTION | RESPIRATORY_TRACT | 12 refills | Status: DC | PRN

## 2017-09-06 MED ORDER — IPRATROPIUM-ALBUTEROL 0.5-2.5 (3) MG/3ML IN SOLN: 3.0000 mL | Freq: Two times a day (BID) | RESPIRATORY_TRACT | 0 refills | Status: DC

## 2017-09-06 MED ORDER — OXYCODONE HCL 10 MG PO TABS: 10.0000 mg | ORAL_TABLET | Freq: Four times a day (QID) | ORAL | 0 refills | Status: AC | PRN

## 2017-09-06 MED ORDER — TRAMADOL HCL 50 MG PO TABS: 50.0000 mg | ORAL_TABLET | Freq: Two times a day (BID) | ORAL | 0 refills | Status: DC | PRN

## 2017-09-06 MED ORDER — ENOXAPARIN SODIUM 40 MG/0.4ML ~~LOC~~ SOLN: 40.0000 mg | SUBCUTANEOUS | 0 refills | Status: DC

## 2017-09-06 MED ORDER — AMOXICILLIN-POT CLAVULANATE 875-125 MG PO TABS: 1.0000 | ORAL_TABLET | Freq: Two times a day (BID) | ORAL | 0 refills | Status: DC

## 2017-09-06 MED ORDER — FLUTICASONE PROPIONATE 50 MCG/ACT NA SUSP: 1.0000 | Freq: Every day | NASAL | 2 refills | Status: DC

## 2017-09-06 MED ORDER — DILTIAZEM HCL 120 MG PO TABS: 120.0000 mg | ORAL_TABLET | Freq: Three times a day (TID) | ORAL | 0 refills | Status: DC

## 2017-09-06 MED ORDER — CHLORHEXIDINE GLUCONATE 0.12 % MT SOLN: 15.0000 mL | Freq: Two times a day (BID) | OROMUCOSAL | 0 refills | Status: DC

## 2017-09-06 MED ORDER — FAMOTIDINE 20 MG PO TABS: 20.0000 mg | ORAL_TABLET | Freq: Two times a day (BID) | ORAL | 0 refills | Status: DC

## 2017-09-06 MED ORDER — AMLODIPINE BESYLATE 10 MG PO TABS: 10.0000 mg | ORAL_TABLET | Freq: Every day | ORAL | 0 refills | Status: DC

## 2017-09-06 MED ORDER — GLUCERNA SHAKE PO LIQD: 237.0000 mL | Freq: Three times a day (TID) | ORAL | 0 refills | Status: DC

## 2017-09-06 MED ORDER — ONDANSETRON HCL 4 MG/2ML IJ SOLN: 4.0000 mg | Freq: Four times a day (QID) | INTRAMUSCULAR | 0 refills | Status: DC | PRN

## 2017-09-06 NOTE — NC FL2 (Signed)
[place] LEVEL OF CARE SCREENING TOOL     IDENTIFICATION  Patient Name: Emily Kennedy Birthdate: [birth year] Sex: female Admission Date (Current Location): 08/31/2017  [facility] and [place] Number:  Herbalist and Address:  [facility],  [place], [place]      Provider Number: [id number]  Attending Physician Name and Address:  Georgette Shell, MD  Relative Name and Phone Number:  Cletus Gash [phone]    Current Level of Care: Hospital Recommended Level of Care: [place] Prior Approval Number:    Date Approved/Denied:   PASRR Number: [id number]  Discharge Plan: SNF    Current Diagnoses: Patient Active Problem List   Diagnosis Date Noted  . Anasarca 09/01/2017  . Thrombocytopenia ([place]) 09/01/2017  . Malignant lymphoma ([place]) 09/01/2017  . Fever 09/01/2017  . UTI (urinary tract infection) 08/31/2017  . 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 MDS (myelodysplastic syndrome) ([place]) 07/14/2017  . Solitary pulmonary nodule 02/15/2016  . CHF (congestive heart failure) ([place]) 02/14/2016  . Malnutrition of moderate degree 09/18/2015  . Benign essential HTN   . Vasculitis ([place])   . Rheumatoid arthritis ([place])   . History of recent hospitalization   . Acute blood loss anemia   . Anemia of chronic disease   . Hyponatremia 09/15/2015  . HCAP (healthcare-associated pneumonia) 09/14/2015  . Respiratory arrest ([place]) intubated 4/13 post arrest 06/27/2015  . Cardiac arrest ([place]) 4/13 suspected mucus plugging as cause of arrest 06/27/2015  . Controlled diabetes mellitus type 2 with complications ([place])   . Dysphagia   . Atrial fibrillation with RVR ([place]) 06/18/2015  . Chronic diastolic CHF (congestive heart failure) ([place])   . Acute encephalopathy 06/14/2015  . Anemia, chronic disease 05/18/2015  . Hypothyroidism  05/18/2015  . Essential hypertension 05/18/2015  . Diabetes mellitus type 2, uncontrolled ([place]) 04/09/2015  . 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    Orientation RESPIRATION BLADDER Height & Weight     Self, Time, Situation, Place  O2 Incontinent Weight: 182 lb 8 oz (82.8 kg) Height:  5\' 4"  (162.6 cm)  BEHAVIORAL SYMPTOMS/MOOD NEUROLOGICAL BOWEL NUTRITION STATUS      Incontinent Diet(see dc summary)  AMBULATORY STATUS COMMUNICATION OF NEEDS Skin   Total Care Verbally Other (Comment)(Ecchymosis left buttocks; foam dressing;; rash right/left buttocks   foam dressing)                       Personal Care Assistance Level of Assistance  Bathing, Feeding, Dressing Bathing Assistance: Maximum assistance Feeding assistance: Limited assistance Dressing Assistance: Maximum assistance     Functional Limitations Info  Sight, Hearing, Speech Sight Info: Adequate Hearing Info: Adequate Speech Info: Adequate    SPECIAL CARE FACTORS FREQUENCY  PT (By licensed PT), OT (By licensed OT)     PT Frequency: 5x/week OT Frequency: 5x/week            Contractures      Additional Factors Info  Code Status, Allergies Code Status Info: DNR Allergies Info: Benadryl Diphenhydramine;Ciprofloxacin;Furosemide;Nitrofurantoin Monohyd Macro;Septra Bactrim;Sulfa Drugs [employer];Allopurinol;Ceclor Cefaclor           Current Medications (09/06/2017):  This is the current hospital active medication list Current Facility-Administered Medications  Medication Dose Route Frequency Provider Last Rate Last Dose  . acetaminophen (TYLENOL) tablet 650 mg  650 mg Oral Q6H PRN Norval Morton, MD   650  mg at 09/02/17 2333   Or  . acetaminophen (TYLENOL) suppository 650 mg  650 mg Rectal Q6H PRN Fuller Plan A, MD   650 mg at 09/01/17 1455  . albuterol (PROVENTIL) (2.5 MG/3ML) 0.083% nebulizer solution 2.5 mg  2.5 mg Nebulization Q4H PRN Georgette Shell, MD   2.5 mg  at 09/05/17 0432  . amLODipine (NORVASC) tablet 10 mg  10 mg Oral Daily Georgette Shell, MD   10 mg at 09/06/17 6283  . amoxicillin-clavulanate (AUGMENTIN) 875-125 MG per tablet 1 tablet  1 tablet Oral Q12H Georgette Shell, MD   1 tablet at 09/06/17 [phone]  . carvedilol (COREG) tablet 25 mg  25 mg Oral BID Fuller Plan A, MD   25 mg at 09/06/17 0928  . chlorhexidine (PERIDEX) 0.12 % solution 15 mL  15 mL Mouth Rinse BID Fuller Plan A, MD   15 mL at 09/06/17 0928  . diltiazem (CARDIZEM) tablet 120 mg  120 mg Oral Q8H Georgette Shell, MD   120 mg at 09/06/17 6160  . enoxaparin (LOVENOX) injection 40 mg  40 mg Subcutaneous Q24H Georgette Shell, MD   40 mg at 09/05/17 2139  . famotidine (PEPCID) tablet 20 mg  20 mg Oral BID Georgette Shell, MD   20 mg at 09/06/17 7371  . feeding supplement (GLUCERNA SHAKE) (GLUCERNA SHAKE) liquid 237 mL  237 mL Oral TID BM Smith, Rondell A, MD   237 mL at 09/06/17 0928  . ferrous sulfate tablet 325 mg  325 mg Oral BID WC Smith, Rondell A, MD   325 mg at 09/06/17 0742  . fluticasone (FLONASE) 50 MCG/ACT nasal spray 1 spray  1 spray Each Nare Daily Georgette Shell, MD   1 spray at 09/06/17 0929  . hydrALAZINE (APRESOLINE) injection 10 mg  10 mg Intravenous Q6H PRN Minda Ditto, RPH   10 mg at 09/03/17 0649  . hydrALAZINE (APRESOLINE) tablet 100 mg  100 mg Oral Q8H Georgette Shell, MD   100 mg at 09/06/17 0626  . insulin aspart (novoLOG) injection 0-9 Units  0-9 Units Subcutaneous TID WC Norval Morton, MD   1 Units at 09/06/17 [phone]  . ipratropium-albuterol (DUONEB) 0.5-2.5 (3) MG/3ML nebulizer solution 3 mL  3 mL Nebulization BID Georgette Shell, MD   3 mL at 09/06/17 4627  . labetalol (NORMODYNE,TRANDATE) injection 10 mg  10 mg Intravenous Q2H PRN Georgette Shell, MD   10 mg at 09/01/17 2217  . levothyroxine (SYNTHROID, LEVOTHROID) tablet 224 mcg  224 mcg Oral QAC breakfast Fuller Plan A, MD   224 mcg at 09/06/17 0742   . lidocaine-prilocaine (EMLA) cream   Topical PRN Fuller Plan A, MD      . MEDLINE mouth rinse  15 mL Mouth Rinse q12n4p Tamala [place], Rondell A, MD   15 mL at 09/05/17 1209  . ondansetron (ZOFRAN) tablet 4 mg  4 mg Oral Q6H PRN Fuller Plan A, MD       Or  . ondansetron (ZOFRAN) injection 4 mg  4 mg Intravenous Q6H PRN Fuller Plan A, MD   4 mg at 09/05/17 1349  . oxyCODONE (Oxy IR/ROXICODONE) immediate release tablet 10 mg  10 mg Oral Q4H PRN Georgette Shell, MD   10 mg at 09/05/17 1430  . sodium chloride flush (NS) 0.9 % injection 10-40 mL  10-40 mL Intracatheter Q12H Georgette Shell, MD   10 mL at 09/05/17 2141  . sodium chloride  flush (NS) 0.9 % injection 10-40 mL  10-40 mL Intracatheter PRN Georgette Shell, MD   10 mL at 09/06/17 0823  . traMADol (ULTRAM) tablet 50 mg  50 mg Oral Q12H PRN Norval Morton, MD   50 mg at 09/05/17 1962     Discharge Medications: Please see discharge summary for a list of discharge medications.  Relevant Imaging Results:  Relevant Lab Results:   Additional Information SS# [id number]  Burnis Medin, LCSW

## 2017-09-06 NOTE — Progress Notes (Signed)
Occupational Therapy Treatment Patient Details Name: Emily Kennedy MRN: [id number] DOB: 1945/03/17 Today's Date: 09/06/2017    History of present illness 72 year old woman with a history of hypertension, diastolic CHF, diabetes, RA, lymphoplasmacytic lymphoma. She was admitted 6/19 with fever, bilateral flank pain, acute renal insufficiency, and acute urinary tract infection.  A CT of her abdomen showed volume overload, anasarca with ascites, a small left effusion and no evidence of ureteral obstruction.     OT comments  Pt making gradual progress toward OT goals. Able to perform transfer EOB to chair with small squat pivots x3 with max assist and cues throughout for sequencing and safety. VSS throughout. D/c plan remains appropriate. Will continue to follow acutely.   Follow Up Recommendations  SNF;Supervision/Assistance - 24 hour    Equipment Recommendations  None recommended by OT    Recommendations for Other Services      Precautions / Restrictions Precautions Precautions: Fall Restrictions Weight Bearing Restrictions: No       Mobility Bed Mobility Overal bed mobility: Needs Assistance Bed Mobility: Supine to Sit     Supine to sit: Mod assist;HOB elevated     General bed mobility comments: Assist to scoot hips around to EOB. Pt able to manage LEs and trunk with significant increased time  Transfers Overall transfer level: Needs assistance Equipment used: Rolling walker (2 wheeled) Transfers: Sit to/from [employer] Sit to Stand: Max assist(unable to acheive full upright standing)   Squat pivot transfers: Max assist(x3 small squat pivots EOB>chair)     General transfer comment: Max assist to boost up into partial standing with RW and perform small squat pivot x3 for transfer EOB to chair. Increased time and cues throughout for technique and safety    Balance Overall balance assessment: Needs assistance Sitting-balance support: Feet  supported Sitting balance-Leahy Scale: Good     Standing balance support: Bilateral upper extremity supported Standing balance-Leahy Scale: Poor                             ADL either performed or assessed with clinical judgement   ADL Overall ADL's : Needs assistance/impaired                                     Functional mobility during ADLs: Maximal assistance;Rolling walker(small squat pivots x3) General ADL Comments: Pt reports she just finished bath, not interested in ADL at this time. Agreeable to OOB to chair.     Vision       Perception     Praxis      Cognition Arousal/Alertness: Awake/alert Behavior During Therapy: WFL for tasks assessed/performed Overall Cognitive Status: Within Functional Limits for tasks assessed                                          Exercises     Shoulder Instructions       General Comments      Pertinent Vitals/ Pain       Pain Assessment: No/denies pain  Home Living  Prior Functioning/Environment              Frequency  Min 2X/week        Progress Toward Goals  OT Goals(current goals can now be found in the care plan section)  Progress towards OT goals: Progressing toward goals  Acute Rehab OT Goals Patient Stated Goal: agreeable to SNF when medically stable OT Goal Formulation: With patient  Plan Discharge plan remains appropriate    Co-evaluation                 AM-PAC PT "6 Clicks" Daily Activity     Outcome Measure   Help from another person eating meals?: None Help from another person taking care of personal grooming?: A Little Help from another person toileting, which includes using toliet, bedpan, or urinal?: A Lot Help from another person bathing (including washing, rinsing, drying)?: A Lot Help from another person to put on and taking off regular upper body clothing?: A Lot Help from  another person to put on and taking off regular lower body clothing?: Total 6 Click Score: 14    End of Session Equipment Utilized During Treatment: Gait belt;Rolling walker  OT Visit Diagnosis: Unsteadiness on feet (R26.81);Muscle weakness (generalized) (M62.81)   Activity Tolerance Patient tolerated treatment well   Patient Left in chair;with call bell/phone within reach;with family/visitor present   Nurse Communication Mobility status        Time: [id number] OT Time Calculation (min): 25 min  Charges: OT General Charges $OT Visit: 1 Visit OT Treatments $Therapeutic Activity: 23-37 mins  Mykaila Blunck A. Ulice Brilliant, M.S., OTR/L Acute Rehab Department: [phone]   Binnie Kand 09/06/2017, 11:39 AM

## 2017-09-06 NOTE — Discharge Summary (Addendum)
Physician Discharge Summary  Emily Kennedy QZR:[id number] DOB: 12-30-1945 DOA: 08/31/2017  PCP: [name], MD  Admit date: 08/31/2017 Discharge date: 09/06/2017  Admitted From: Home Disposition: Skilled nursing facility Recommendations for Outpatient Follow-up:  1. Follow up with PCP in 1-2 weeks 2. Please obtain BMP/CBC Friday 6/28. 3. Follow-up with [facility] none Equipment/Devices: None  Discharge Condition stable CODE STATUS DNR Diet recommendation: Cardiac Brief/Interim Summary:72 y.o.femalewith medical history significant ofHTN, HLD,CHF, DM type II, vasculitis, and malignant lymphoma currently receiving chemotherapy followed by [name] of oncology; who presents with complaints of fever. History is mostly obtained from the patient's daughter as patient defers to her arm. Over the last several weeks patient has had abdominal and bilateral leg swelling, but in the last week had progressively worsened to the point which patient was having difficulty ambulating. She was seen at the cancer center yesterday where she received chemotherapy of bendamustine andrituximab. Patient was also given Lasix 40 mg IV due to swelling.She had not been taking torsemide.Since yesterday thepatient reports only having urinated once since yesterday. Today patient reported acute onset of sharp bilateral flank pain(worse on the right flank) and abdominal pain. Temperature at home was noted to be up to around 101.5 F. Patient tried taking Tylenol and tramadol without relief of symptoms around noon. In addition to the leg swelling she noticed some mild redness of the legs and generalized malaise. Denies any significant shortness of breath, cough, chest pain, nausea, vomiting, dysuria, ordiarrhea.Per review of records it appears en routewith EMS patient was noted to be febrile up to 104.2 F and was given1000mg  ofTylenol prior to arrival.      Discharge Diagnoses:   Principal Problem:   UTI (urinary tract infection) Active Problems:   Anemia, chronic disease   Essential hypertension   Chronic diastolic CHF (congestive heart failure) (HCC)   Controlled diabetes mellitus type 2 with complications (HCC)   Anasarca   Thrombocytopenia (HCC)   Malignant lymphoma (HCC)   Fever  1]aspiration pneumonia no temperature spikes overnight.  Continue Augmentin for 7 more days and then stop.   2]UTI patient admitted with a diagnosis of UTI urine culture report shows multi species present. Suggesting collection.  DC Unasyn  3]Lymphomastatus post chemotherapy 08/30/2017.  Patient has follow-up appointment on July 2 with oncologist and then chemo appointment on July 16.  4]HTN continue norvasc,cardizem and Demadex  5]dm not on any meds at home.hba1c 6.2.  Blood sugar stable.  6]CHF ejection fraction -55to 60% by echo 09/02/2017.continue Demadex.  7]AKI mild improvement.  8]hypothyroidism continue synthroid  9]ascites status post paracentesis 3.3 L removed 6/22.  No evidence of infection.  10] lower extremity edema and erythema-chronic.  11]PATIENT IS NOT ON LOVENOX.      Discharge Instructions  Discharge Instructions    Call MD for:  difficulty breathing, headache or visual disturbances   Complete by:  As directed    Call MD for:  difficulty breathing, headache or visual disturbances   Complete by:  As directed    Call MD for:  persistant dizziness or light-headedness   Complete by:  As directed    Call MD for:  persistant dizziness or light-headedness   Complete by:  As directed    Call MD for:  persistant nausea and vomiting   Complete by:  As directed    Call MD for:  persistant nausea and vomiting   Complete by:  As directed    Call MD for:  redness, tenderness, or  signs of infection (pain, swelling, redness, odor or green/yellow discharge around incision site)   Complete by:  As directed    Call MD for:  redness,  tenderness, or signs of infection (pain, swelling, redness, odor or green/yellow discharge around incision site)   Complete by:  As directed    Call MD for:  severe uncontrolled pain   Complete by:  As directed    Call MD for:  severe uncontrolled pain   Complete by:  As directed    Call MD for:  temperature >100.4   Complete by:  As directed    Diet - low sodium heart healthy   Complete by:  As directed    Diet - low sodium heart healthy   Complete by:  As directed    Increase activity slowly   Complete by:  As directed    Increase activity slowly   Complete by:  As directed      Allergies as of 09/06/2017      Reactions   Benadryl [diphenhydramine] Swelling   Ciprofloxacin Hives   Furosemide Itching, Swelling, Rash, Other (See Comments)   Made edema worse   Nitrofurantoin Monohyd Macro Hives   Septra [bactrim] Hives   Sulfa Drugs Cross Reactors Hives   Allopurinol Itching   Ceclor [cefaclor] Rash   Breaks out in rash chest down.      Medication List    STOP taking these medications   acyclovir 400 MG tablet Commonly known as:  ZOVIRAX   allopurinol 300 MG tablet Commonly known as:  ZYLOPRIM   carvedilol 25 MG tablet Commonly known as:  COREG   dexamethasone 4 MG tablet Commonly known as:  DECADRON   glucosamine-chondroitin 500-400 MG tablet   HYDROcodone-acetaminophen 5-325 MG tablet Commonly known as:  NORCO/VICODIN   lisinopril 40 MG tablet Commonly known as:  PRINIVIL,ZESTRIL   LORazepam 0.5 MG tablet Commonly known as:  ATIVAN   prochlorperazine 10 MG tablet Commonly known as:  COMPAZINE     TAKE these medications   acetaminophen 325 MG tablet Commonly known as:  TYLENOL Take 2 tablets (650 mg total) by mouth every 6 (six) hours as needed for fever.   albuterol (2.5 MG/3ML) 0.083% nebulizer solution Commonly known as:  PROVENTIL Take 3 mLs (2.5 mg total) by nebulization every 4 (four) hours as needed for wheezing or shortness of breath.    amLODipine 10 MG tablet Commonly known as:  NORVASC Take 1 tablet (10 mg total) by mouth daily. Start taking on:  09/07/2017 What changed:    medication strength  how much to take   amoxicillin-clavulanate 875-125 MG tablet Commonly known as:  AUGMENTIN Take 1 tablet by mouth every 12 (twelve) hours.   CALCIUM 500 PO Take 500 mg by mouth every morning.   chlorhexidine 0.12 % solution Commonly known as:  PERIDEX 15 mLs by Mouth Rinse route 2 (two) times daily.   cholecalciferol 400 units Tabs tablet Commonly known as:  VITAMIN D Take 400 Units by mouth daily.   diltiazem 120 MG tablet Commonly known as:  CARDIZEM Take 1 tablet (120 mg total) by mouth every 8 (eight) hours. What changed:    medication strength  how much to take  when to take this  additional instructions   enoxaparin 40 MG/0.4ML injection Commonly known as:  LOVENOX Inject 0.4 mLs (40 mg total) into the skin daily.   famotidine 20 MG tablet Commonly known as:  PEPCID Take 1 tablet (20 mg total) by mouth  2 (two) times daily.   feeding supplement (GLUCERNA SHAKE) Liqd Take 237 mLs by mouth 3 (three) times daily between meals. What changed:  when to take this   ferrous sulfate 325 (65 FE) MG tablet Take 325 mg by mouth 2 (two) times daily with a meal.   Fish Oil 1000 MG Caps Take 4,000 mg by mouth daily.   fluticasone 50 MCG/ACT nasal spray Commonly known as:  FLONASE Place 1 spray into both nostrils daily. Start taking on:  09/07/2017   hydrALAZINE 100 MG tablet Commonly known as:  APRESOLINE Take 100 mg by mouth 3 (three) times daily.   ipratropium-albuterol 0.5-2.5 (3) MG/3ML Soln Commonly known as:  DUONEB Take 3 mLs by nebulization 2 (two) times daily.   levothyroxine 112 MCG tablet Commonly known as:  SYNTHROID, LEVOTHROID Take 224 mcg by mouth daily before breakfast.   lidocaine-prilocaine cream Commonly known as:  EMLA Apply to affected area once   multivitamin  tablet Take 1 tablet by mouth daily.   ondansetron 4 MG tablet Commonly known as:  ZOFRAN Take 1 tablet (4 mg total) by mouth every 6 (six) hours as needed for nausea. What changed:    medication strength  how much to take  when to take this  reasons to take this  additional instructions   ondansetron 4 MG/2ML Soln injection Commonly known as:  ZOFRAN Inject 2 mLs (4 mg total) into the vein every 6 (six) hours as needed for nausea. What changed:  You were already taking a medication with the same name, and this prescription was added. Make sure you understand how and when to take each.   Oxycodone HCl 10 MG Tabs Take 1 tablet (10 mg total) by mouth every 6 (six) hours as needed for up to 7 days for severe pain.   torsemide 10 MG tablet Commonly known as:  DEMADEX Take 2 tablets (20 mg total) by mouth daily. What changed:    when to take this  reasons to take this   traMADol 50 MG tablet Commonly known as:  ULTRAM Take 1 tablet (50 mg total) by mouth every 12 (twelve) hours as needed for moderate pain. What changed:    when to take this  reasons to take this   vitamin B-12 500 MCG tablet Commonly known as:  CYANOCOBALAMIN Take 500 mcg by mouth daily.   zinc gluconate 50 MG tablet Take 50 mg by mouth daily.      Follow-up Information    [name], MD Follow up.   Specialty:  Internal Medicine Contact information: [address] [place] [address] [phone]        Brunetta Genera, MD Follow up.   Specialties:  Hematology, Oncology Contact information: [address] [place] [address] [phone]          Allergies  Allergen Reactions  . Benadryl [Diphenhydramine] Swelling  . Ciprofloxacin Hives  . Furosemide Itching, Swelling, Rash and Other (See Comments)    Made edema worse  . Nitrofurantoin Monohyd Macro Hives  . Septra [Bactrim] Hives  . Sulfa Drugs Cross Reactors Hives  . Allopurinol Itching   . Ceclor [Cefaclor] Rash    Breaks out in rash chest down.    Consultations:  PCCM ONC   Procedures/Studies: Dg Chest 1 View  Result Date: 09/01/2017 CLINICAL DATA:  Congestive heart failure EXAM: CHEST  1 VIEW COMPARISON:  August 31, 2017 FINDINGS: The heart size and mediastinal contours are stable. Right central venous  line is identified unchanged. Heart size is enlarged. There is pulmonary edema. Consolidation of left lung base with left pleural effusion is identified. Minimal right pleural effusion is noted. The visualized skeletal structures are stable. IMPRESSION: Congestive heart failure. Bilateral pleural effusions. Consolidation of left lung base underlying pneumonia is not excluded. Electronically Signed   By: Abelardo Diesel M.D.   On: 09/01/2017 15:25   Dg Chest 2 View  Result Date: 08/31/2017 CLINICAL DATA:  Fever. EXAM: CHEST - 2 VIEW COMPARISON:  Chest x-ray dated August 14, 2017. FINDINGS: Unchanged right chest wall port catheter with tip at the cavoatrial junction. Stable cardiomegaly. Normal pulmonary vascularity. Unchanged small left pleural effusion. No consolidation or pneumothorax. No acute osseous abnormality. IMPRESSION: 1. Unchanged small left pleural effusion. Electronically Signed   By: Titus Dubin M.D.   On: 08/31/2017 15:39   Dg Chest 2 View  Result Date: 08/14/2017 CLINICAL DATA:  Shortness of breath status post 1 round of chemotherapy approximately 25-30 days ago. Fever. EXAM: CHEST - 2 VIEW COMPARISON:  Chest x-ray dated 02/02/2017. Chest CT dated 02/14/2016. FINDINGS: Stable cardiomegaly. Stable small LEFT pleural effusion. No new lung findings. RIGHT chest wall Port-A-Cath in place with tip adequately positioned at the level of the mid SVC. IMPRESSION: No acute findings. Stable small LEFT pleural effusion. No evidence of pneumonia or pulmonary edema. Electronically Signed   By: Franki Cabot M.D.   On: 08/14/2017 09:24   Dg Abd 1 View  Result Date:  09/01/2017 CLINICAL DATA:  Congestive heart failure, diabetes and vasculitis. EXAM: ABDOMEN - 1 VIEW COMPARISON:  None. FINDINGS: There is no bowel obstruction or free air. Consolidation left lung base is identified. There bilateral pleural effusions. IMPRESSION: No bowel obstruction or free air. Consolidation left lung base identified. Electronically Signed   By: Abelardo Diesel M.D.   On: 09/01/2017 15:26   US Paracentesis  Result Date: 09/02/2017 INDICATION: Patient with history of CHF, anasarca, myelodysplastic syndrome, lymphoplasmacytic lymphoma, ascites. Request made for diagnostic and therapeutic paracentesis. EXAM: ULTRASOUND GUIDED DIAGNOSTIC AND THERAPEUTIC PARACENTESIS MEDICATIONS: None COMPLICATIONS: None immediate. PROCEDURE: Informed written consent was obtained from the patient after a discussion of the risks, benefits and alternatives to treatment. A timeout was performed prior to the initiation of the procedure. Initial ultrasound scanning demonstrates a moderate amount of ascites within the left mid to lower abdominal quadrant. The left mid to lower abdomen was prepped and draped in the usual sterile fashion. 1% lidocaine was used for local anesthesia. Following this, a 19 gauge, 7-cm, Yueh catheter was introduced. An ultrasound image was saved for documentation purposes. The paracentesis was performed. The catheter was removed and a dressing was applied. The patient tolerated the procedure well without immediate post procedural complication. FINDINGS: A total of approximately 3.3 liters of hazy, light yellow fluid was removed. Samples were sent to the laboratory as requested by the clinical team. IMPRESSION: Successful ultrasound-guided diagnostic and therapeutic paracentesis yielding 3.3 liters of peritoneal fluid. Read by: Rowe Robert, PA-C Electronically Signed   By: Lucrezia Europe M.D.   On: 09/02/2017 16:20   Ct Renal Stone Study  Result Date: 08/31/2017 CLINICAL DATA:  Right-sided flank  pain. EXAM: CT ABDOMEN AND PELVIS WITHOUT CONTRAST TECHNIQUE: Multidetector CT imaging of the abdomen and pelvis was performed following the standard protocol without IV contrast. COMPARISON:  07/22/2017 PET-CT FINDINGS: Lower chest: Small left and trace right pleural effusion. Moderate pericardial effusion, stable. Small hiatal hernia. Hepatobiliary: High-density liver without focal abnormality. The caudate is  prominent but the liver surface is not definitely nodular. Negative gallbladder and common bile duct. Pancreas: Unremarkable. Spleen: Chronic splenomegaly, recently evaluated by PET. AP span is 19 cm. Adrenals/Urinary Tract: Negative adrenals. No hydronephrosis or stone. Unremarkable bladder. Stomach/Bowel:  No obstruction. No inflammatory changes Vascular/Lymphatic: Atherosclerotic calcification. No mass or adenopathy. Reproductive:Negative Other: Moderate ascites that is non loculated, increased. There is marked anasarca, progressed. Small periumbilical hernia containing ascitic fluid. Musculoskeletal: No acute abnormalities. IMPRESSION: 1. Generalized volume overload. Marked anasarca and moderate ascites has progressed from 07/22/2017. Small left effusion and moderate pericardial effusion are similar to prior. 2. Chronic splenomegaly, reference PET CT last month. Electronically Signed   By: Monte Fantasia M.D.   On: 08/31/2017 18:55   (Echo, Carotid, EGD, Colonoscopy, ERCP)    Subjective:   Discharge Exam: Vitals:   09/06/17 0523 09/06/17 0624  BP: (!) 149/67   Pulse: 70   Resp: 18   Temp: 98.4 F (36.9 C)   SpO2: 96% 95%   Vitals:   09/05/17 2035 09/05/17 2118 09/06/17 0523 09/06/17 0624  BP:  (!) 144/65 (!) 149/67   Pulse:  71 70   Resp:  18 18   Temp:  98.1 F (36.7 C) 98.4 F (36.9 C)   TempSrc:  Oral Oral   SpO2: 96% 95% 96% 95%  Weight:   82.8 kg (182 lb 8 oz)   Height:        General: Pt is alert, awake, not in acute distress Cardiovascular: RRR, S1/S2 +, no rubs,  no gallops Respiratory: CTA bilaterally, no wheezing, no rhonchi Abdominal: Soft, NT, ND, bowel sounds + Extremities: no edema, no cyanosis    The results of significant diagnostics from this hospitalization (including imaging, microbiology, ancillary and laboratory) are listed below for reference.     Microbiology: Recent Results (from the past 240 hour(s))  Culture, blood (Routine x 2)     Status: None   Collection Time: 08/31/17  2:40 PM  Result Value Ref Range Status   Specimen Description   Final    BLOOD PORTA CATH Performed at [facility] [address], [place], [place] [address]    Special Requests   Final    BOTTLES DRAWN AEROBIC AND ANAEROBIC Blood Culture adequate volume Performed at [facility] [address], [place], [place] [address]    Culture   Final    NO GROWTH 5 DAYS Performed at [facility], [place] [address], [place], [place] [address]    Report Status 09/05/2017 FINAL  Final  Culture, blood (Routine x 2)     Status: None   Collection Time: 08/31/17  3:04 PM  Result Value Ref Range Status   Specimen Description   Final    BLOOD RIGHT ARM Performed at [facility] [address] [place], [place] [address]    Special Requests   Final    BOTTLES DRAWN AEROBIC AND ANAEROBIC Blood Culture adequate volume Performed at [facility] [address] [place], [place] [address]    Culture   Final    NO GROWTH 5 DAYS Performed at [facility], [place] [address] [place], [place] [address]    Report Status 09/05/2017 FINAL  Final  Urine culture     Status: Abnormal   Collection Time: 08/31/17  5:36 PM  Result Value Ref Range Status   Specimen Description   Final    URINE, RANDOM Performed at [facility], [place] [address], [place], [place] [address]    Special Requests   Final    NONE Performed at [facility], [place] [address], [place], [place] [address]    Culture MULTIPLE SPECIES PRESENT, SUGGEST RECOLLECTION (A)  Final   Report Status 09/02/2017 FINAL  Final  Culture, blood (routine x 2)     Status: None   Collection Time: 09/01/17  3:17 PM  Result Value Ref Range Status   Specimen Description   Final    BLOOD RIGHT ANTECUBITAL Performed at [facility] [address] [place], [place] [address]    Special Requests   Final    BOTTLES DRAWN AEROBIC ONLY Blood Culture adequate volume Performed at [facility] [address], [place], [place] [address]    Culture   Final    NO GROWTH 5 DAYS Performed at [facility], [place] [address] [place], [place] [address]    Report Status 09/06/2017 FINAL  Final  MRSA PCR Screening     Status: None   Collection Time: 09/01/17  3:30 PM  Result Value Ref Range Status   MRSA by PCR NEGATIVE NEGATIVE Final    Comment:        The GeneXpert MRSA Assay (FDA approved for NASAL specimens only), is one component of a comprehensive MRSA colonization surveillance program. It is not intended to diagnose MRSA infection nor to guide or monitor treatment for MRSA infections. Performed at [facility], [place] [address], [place], [place] [address]   Culture, blood (routine x 2)     Status: None   Collection Time: 09/01/17  3:31 PM  Result Value Ref Range Status   Specimen Description   Final    BLOOD RIGHT HAND Performed at [facility] [address], [place], [place] [address]    Special Requests   Final    BOTTLES DRAWN AEROBIC ONLY Blood Culture adequate volume   Culture   Final    NO GROWTH 5 DAYS Performed at [facility], [place] [address] [place], [place] [address]    Report Status 09/06/2017 FINAL  Final  Culture, body fluid-bottle     Status: None (Preliminary result)   Collection Time: 09/02/17  4:16 PM  Result Value Ref Range Status   Specimen Description PERITONEAL   Final   Special Requests NONE  Final   Culture   Final    NO GROWTH 4 DAYS Performed at [facility] [address], [place], [place] [address]    Report Status PENDING  Incomplete  Gram stain     Status: None   Collection Time: 09/02/17  4:16 PM  Result Value Ref Range Status   Specimen Description PERITONEAL  Final   Special Requests   Final    NONE Performed at [facility], [address] [place], [place] [address]    Gram Stain   Final    FEW WBC PRESENT, PREDOMINANTLY MONONUCLEAR NO ORGANISMS SEEN    Report Status 09/05/2017 FINAL  Final     Labs: BNP (last 3 results) Recent Labs    10/27/16 1050 08/14/17 0901  BNP 72.8 330.0*   Basic Metabolic Panel: Recent Labs  Lab 08/31/17 1437 09/01/17 0407 09/02/17 0616 09/03/17 0335 09/04/17 0308 09/05/17 0314  NA 134* 134* 135 134* 132* 134*  K 4.6 5.1 4.7 4.5 4.7 4.7  CL 109 109 110 109 107 107  CO2  20* 20* 20* 20* 21* 21*  GLUCOSE 199* 256* 131* 139* 159* 146*  BUN 41* 41* 42* 44* 44* 44*  CREATININE 1.61* 1.60* 1.63* 1.61* 1.51* 1.41*  CALCIUM 8.1* 7.9* 8.0* 7.7* 7.6* 7.7*  MG 2.2  --   --   --   --   --    Liver Function Tests: Recent Labs  Lab 08/31/17 1437  AST 16  ALT 14  ALKPHOS 54  BILITOT 0.6  PROT 4.8*  ALBUMIN 2.3*   No results for input(s): LIPASE, AMYLASE in the last 168 hours. No results for input(s): AMMONIA in the last 168 hours. CBC: Recent Labs  Lab 08/31/17 1437 09/01/17 0407 09/02/17 0616 09/03/17 0335 09/04/17 0308 09/05/17 0314  WBC 4.9 2.5* 5.1 4.6 3.1* 3.8*  NEUTROABS 4.5  --  4.5  --   --   --   HGB 8.5* 7.5* 8.7* 8.3* 9.3* 8.9*  HCT 24.9* 22.9* 26.0* 24.3* 27.8* 26.3*  MCV 87.7 86.4 86.1 85.9 85.8 86.5  PLT 112* 81* 82* 103* 86* 116*   Cardiac Enzymes: Recent Labs  Lab 08/31/17 1437 08/31/17 2139  CKTOTAL 40 42  CKMB 3.2  --    BNP: Invalid input(s): POCBNP CBG: Recent Labs  Lab 09/04/17 2120 09/05/17 1131 09/05/17 1640 09/05/17 2122  09/06/17 0736  GLUCAP 132* 157* 124* 126* 131*   D-Dimer No results for input(s): DDIMER in the last 72 hours. Hgb A1c Recent Labs    09/05/17 0314  HGBA1C 6.2*   Lipid Profile No results for input(s): CHOL, HDL, LDLCALC, TRIG, CHOLHDL, LDLDIRECT in the last 72 hours. Thyroid function studies No results for input(s): TSH, T4TOTAL, T3FREE, THYROIDAB in the last 72 hours.  Invalid input(s): FREET3 Anemia work up No results for input(s): VITAMINB12, FOLATE, FERRITIN, TIBC, IRON, RETICCTPCT in the last 72 hours. Urinalysis    Component Value Date/Time   COLORURINE YELLOW 08/31/2017 1736   APPEARANCEUR HAZY (A) 08/31/2017 1736   LABSPEC 1.014 08/31/2017 1736   PHURINE 5.0 08/31/2017 1736   GLUCOSEU 50 (A) 08/31/2017 1736   HGBUR MODERATE (A) 08/31/2017 1736   BILIRUBINUR NEGATIVE 08/31/2017 1736   KETONESUR NEGATIVE 08/31/2017 1736   PROTEINUR >=300 (A) 08/31/2017 1736   UROBILINOGEN 1.0 01/06/2011 2252   NITRITE NEGATIVE 08/31/2017 1736   LEUKOCYTESUR LARGE (A) 08/31/2017 1736   Sepsis Labs Invalid input(s): PROCALCITONIN,  WBC,  LACTICIDVEN Microbiology Recent Results (from the past 240 hour(s))  Culture, blood (Routine x 2)     Status: None   Collection Time: 08/31/17  2:40 PM  Result Value Ref Range Status   Specimen Description   Final    BLOOD PORTA CATH Performed at [facility], [place] [address] [place], [place] [address]    Special Requests   Final    BOTTLES DRAWN AEROBIC AND ANAEROBIC Blood Culture adequate volume Performed at [facility] [address] [place], [place] [address]    Culture   Final    NO GROWTH 5 DAYS Performed at [facility], [place] [address], [place], [place] [address]    Report Status 09/05/2017 FINAL  Final  Culture, blood (Routine x 2)     Status: None   Collection Time: 08/31/17  3:04 PM  Result Value Ref Range Status   Specimen Description   Final    BLOOD RIGHT ARM Performed at  [facility] [address], [place], [place] [address]    Special Requests   Final  BOTTLES DRAWN AEROBIC AND ANAEROBIC Blood Culture adequate volume Performed at [facility] [address], [place], [place] [address]    Culture   Final    NO GROWTH 5 DAYS Performed at [facility], [place] [address] [place], [place] [address]    Report Status 09/05/2017 FINAL  Final  Urine culture     Status: Abnormal   Collection Time: 08/31/17  5:36 PM  Result Value Ref Range Status   Specimen Description   Final    URINE, RANDOM Performed at [facility] [address], [place], [place] [address]    Special Requests   Final    NONE Performed at [facility], [place] [address] [place], [place] [address]    Culture MULTIPLE SPECIES PRESENT, SUGGEST RECOLLECTION (A)  Final   Report Status 09/02/2017 FINAL  Final  Culture, blood (routine x 2)     Status: None   Collection Time: 09/01/17  3:17 PM  Result Value Ref Range Status   Specimen Description   Final    BLOOD RIGHT ANTECUBITAL Performed at [facility] [address], [place], [place] [address]    Special Requests   Final    BOTTLES DRAWN AEROBIC ONLY Blood Culture adequate volume Performed at [facility] [address], [place], [place] [id number]    Culture   Final    NO GROWTH 5 DAYS Performed at [facility], [place] [address] [place], [place] [address]    Report Status 09/06/2017 FINAL  Final  MRSA PCR Screening     Status: None   Collection Time: 09/01/17  3:30 PM  Result Value Ref Range Status   MRSA by PCR NEGATIVE NEGATIVE Final    Comment:        The GeneXpert MRSA Assay (FDA approved for NASAL specimens only), is one component of a comprehensive MRSA colonization surveillance program. It is not intended to diagnose MRSA infection nor to guide or monitor treatment for MRSA  infections. Performed at [facility], [place] [address], [place], [place] [address]   Culture, blood (routine x 2)     Status: None   Collection Time: 09/01/17  3:31 PM  Result Value Ref Range Status   Specimen Description   Final    BLOOD RIGHT HAND Performed at [place] [address] [place], [place] [address]    Special Requests   Final    BOTTLES DRAWN AEROBIC ONLY Blood Culture adequate volume   Culture   Final    NO GROWTH 5 DAYS Performed at [facility], [place] [address], [place], [place] [address]    Report Status 09/06/2017 FINAL  Final  Culture, body fluid-bottle     Status: None (Preliminary result)   Collection Time: 09/02/17  4:16 PM  Result Value Ref Range Status   Specimen Description PERITONEAL  Final   Special Requests NONE  Final   Culture   Final    NO GROWTH 4 DAYS Performed at [facility] [address], [place], [place] [address]    Report Status PENDING  Incomplete  Gram stain     Status: None   Collection Time: 09/02/17  4:16 PM  Result Value Ref Range Status   Specimen Description PERITONEAL  Final   Special Requests   Final    NONE Performed at [facility], [address] [place], [place] [address]  Gram Stain   Final    FEW WBC PRESENT, PREDOMINANTLY MONONUCLEAR NO ORGANISMS SEEN    Report Status 09/05/2017 FINAL  Final     Time coordinating discharge:39  minutes  SIGNED:   Georgette Shell, MD  [facility] 09/06/2017, 11:10 AM Pager   If 7PM-7AM, please contact night-coverage [email or web address] Password [email or web address]

## 2017-09-06 NOTE — Progress Notes (Signed)
Report called to [facility] at [place]. PTAR has been arranged awaiting arrival to transport.

## 2017-09-06 NOTE — Clinical Social Work Placement (Signed)
Patient received and accepted bed offer at [facility]. Facility aware of patient's discharge and confirmed bed offer. PTAR contacted, patient's family notified. Patient's RN can call report to [phone] Room 1201P, packet complete. CSW signing off, no other needs identified at this time.  CLINICAL SOCIAL WORK PLACEMENT  NOTE  Date:  09/06/2017  Patient Details  Name: Emily Kennedy MRN: [id number] Date of Birth: 04/05/1945  Clinical Social Work is seeking post-discharge placement for this patient at the [place] level of care (*CSW will initial, date and re-position this form in  chart as items are completed):  Yes   Patient/family provided with [place] Work Department's list of facilities offering this level of care within the geographic area requested by the patient (or if unable, by the patient's family).  Yes   Patient/family informed of their freedom to choose among providers that offer the needed level of care, that participate in Medicare, Medicaid or managed care program needed by the patient, have an available bed and are willing to accept the patient.  Yes   Patient/family informed of [facility]'s ownership interest in [facility] and [facility], as well as of the fact that they are under no obligation to receive care at these facilities.  PASRR submitted to EDS on       PASRR number received on       Existing PASRR number confirmed on 09/06/17     FL2 transmitted to all facilities in geographic area requested by pt/family on 09/06/17     FL2 transmitted to all facilities within larger geographic area on       Patient informed that his/her managed care company has contracts with or will negotiate with certain facilities, including the following:        Yes   Patient/family informed of bed offers received.  Patient chooses bed at [facility]     Physician recommends and patient chooses bed at      Patient to be  transferred to [facility] on 09/06/17.  Patient to be transferred to facility by PTAR     Patient family notified on 09/06/17 of transfer.  Name of family member notified:  [name]     PHYSICIAN       Additional Comment:    _______________________________________________ Burnis Medin, LCSW 09/06/2017, 2:50 PM

## 2017-09-06 NOTE — Clinical Social Work Note (Signed)
Clinical Social Work Assessment  Patient Details  Name: Emily Kennedy MRN: [id number] Date of Birth: 1945/11/03  Date of referral:  09/06/17               Reason for consult:  Facility Placement                Permission sought to share information with:  Facility Sport and exercise psychologist, Family Supports Permission granted to share information::  Yes, Verbal Permission Granted  Name::     Emily Kennedy  Agency::     Relationship::  daughter  Contact Information:  [phone]  Housing/Transportation Living arrangements for the past 2 months:  [place] of Information:  Patient, Adult Children Patient Interpreter Needed:  None Criminal Activity/Legal Involvement Pertinent to Current Situation/Hospitalization:  No - Comment as needed Significant Relationships:  Adult Children Lives with:  Self Do you feel safe going back to the place where you live?  (PT recommending SNF) Need for family participation in patient care:  Yes (Comment)  Care giving concerns:  Patient from home alone. Patient's daughter reported that she stays with patient often. Patient reported that she is independent with all ADLs at baseline and has a walker and wheelchair at home as needed. PT recommending SNF;24 hour supervision.   Social Worker assessment / plan:  CSW spoke with patient at bedside regarding PT recommendation for SNF; 24 hour supervision. CSW explained SNF placement process, patient verbalized understanding and reported that she has been to SNF a few years ago. Patient reported that she wants her daughter involved and that her daughter may want patient to go to a SNF close to patient's daughter's home. CSW agreed to follow up with patient's daughter, patient granted CSW verbal permission to speak with her daughter.   CSW contacted patient's daughter Emily Kennedy) and discussed patient's discharge planning. Patient's daughter reported that she is agreeable to SNF for ST rehab for  patient. Patient's daughter reported that their first choice is [facility] and the second choice is [facility] because it is close to patient's daughter's home. CSW agreed to complete patient's FL2 and follow up with preferred SNFs.   CSW completed patient's FL2 and will follow up with preferred SNFs.  CSW will continue to follow and assist with discharge planning.  Employment status:  Retired Forensic scientist:  [employer] PT Recommendations:  [place], [place] / Referral to community resources:  [place]  Patient/Family's Response to care:  Patient/patient's daughter appreciative of CSW assistance with discharge planning.   Patient/Family's Understanding of and Emotional Response to Diagnosis, Current Treatment, and Prognosis:  Patient presented calm and verbalized understanding of current treatment plan. Patient's daughter involved in patient's care and verbalized agreement with plan for patient to dc to SNF for ST rehab.   Emotional Assessment Appearance:  Appears stated age Attitude/Demeanor/Rapport:  Other(Cooperative) Affect (typically observed):  Accepting, Appropriate, Calm Orientation:  Oriented to Self, Oriented to Place, Oriented to  Time, Oriented to Situation Alcohol / Substance use:  Not Applicable Psych involvement (Current and /or in the community):  No (Comment)  Discharge Needs  Concerns to be addressed:  Care Coordination Readmission within the last 30 days:  No Current discharge risk:  Lives alone, Dependent with Mobility Barriers to Discharge:  No Barriers Identified   Burnis Medin, LCSW 09/06/2017, 9:41 AM

## 2017-09-07 LAB — CULTURE, BODY FLUID W GRAM STAIN -BOTTLE: Culture: NO GROWTH

## 2017-09-07 LAB — CULTURE, BODY FLUID-BOTTLE

## 2017-09-09 ENCOUNTER — Telehealth: Payer: Self-pay

## 2017-09-09 NOTE — Telephone Encounter (Signed)
Left a detail message of changes to patient upcoming appointments.Per 6/28 sch msg.mailed letter and calender of these appointment.

## 2017-09-13 ENCOUNTER — Other Ambulatory Visit: Payer: Medicare Other

## 2017-09-13 ENCOUNTER — Ambulatory Visit: Payer: Medicare Other | Admitting: Hematology

## 2017-09-26 NOTE — Progress Notes (Signed)
HEMATOLOGY/ONCOLOGY CLINIC NOTE  Date of Service: 09/27/2017  Patient Care Team: [name], MD as PCP - General (Internal Medicine)  CHIEF COMPLAINTS/PURPOSE OF CONSULTATION:  Myelodysplastic syndrome Lymphoma  Oncologic History:  The pt was first diagnosed with MDS on 07/08/17 after a bone marrow biopsy on 06/22/17 revealed dysplasia in the megakaryocytes. The same biopsy also revealed lymphoid aggregates, and an atypical distribution and kappa-restriction of plasma cells, without monoclonal B or T cell populations. The pt's 07/08/17 labs showed IgM elevation at 1926, IgG at 131, and IgA<5 with an M spike of 1.1 g/dL from SPEP. 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 She began Bendamustine 23m/m2 (only D1 for 50% dose reduction) and Rituxan 375 mg/ms on 08/02/17.    HISTORY OF PRESENTING ILLNESS:   Emily JMiami Latulippeis a wonderful 72y.o. female who has been referred to [facility] my colleague Dr MGrace Isaacfor evaluation and management of MDS and lymphoma. TShe presents prior to the start of C2 of her Bendamustine Rituxan treatment. She is accompanied today by her daughter. The pt reports that she is doing well overall.   The pt was first diagnosed with MDS on 07/08/17 after a bone marrow biopsy on 06/22/17 revealed dysplasia in the megakaryocytes. The same biopsy also revealed lymphoid aggregates, and an atypical distribution and kappa-restriction of plasma cells, without monoclonal B or T cell populations. The pt's 07/08/17 labs showed IgM elevation at 1926, IgG at 131, and IgA<5 with an M spike of 1.1 g/dL from SPEP. 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 She began Bendamustine 92mm2, dose reduced to one day of treatment, and Rituxan 375 mg/ms on 08/02/17.   The pt reports that she tolerated C1 very well.  She notes that she had a blood transfusion 2 weeks ago,  She had a fever of 100.7 which resolved by the time she presented to the ED   The pt notes that she took Methotrexate 20 years ago and took 1055mrednisone intermittently. She notes that her RA has not been too bothersome.  She notes that she has leg and arm swelling, is taking 36m65mrsemide, and sees her PCP Dr Kim Maudie Mercuryarding this. She notes that her weight was around 140 pounds 4-6 weeks ago, but presents today at 181 pounds. She notes that her increased swelling and weight gain is completely new in the last month. Review of her 08/02/17 weight in clinic shows she was at 157 pounds.   She has been taking Amlodipine for about a year ago, She has been taking her thyroid replacement with coffee and milk. She is continuing to take Vitamin B12 and Zinc replacement.   Most recent lab results (08/30/17) of CBC, CMP  is as follows: all values are WNL except for WBC at 3.7k, RBC at 3.09, HGB at 8.7, HCT at 26.6, PLT at 97k, Lymphs abs at 200, Sodium at 134, CO2 at 21, Glucose at 141, BUN at 33, Creatinine at 1.48, Calcium at 8.2, Total Protein at 4.9, Albumin at 2.1, Total Bilirubin at <0.2, GFR at 35. LDH 08/30/17 is WNL at 195  On review of systems, pt reports moving her  bowels well, leg swelling, hand swelling, and denies nausea, chills, bleeding, SOB, discomfort at port site, back pains, abdominal pains, and any other symptoms.   Interval History:   Emily Kennedy returns today for management and evaluation of her Myelodysplastic syndrome. The patient's last visit with [place] was on 08/30/17. She is accompanied today by her daughter.  The pt reports  that she is feeling bad today. She is unsure if she has passed urine or had a bowel movement in 3 or 4 days. Her daughter notes that she isn't eating or drinking very much at all- nibbling more than eating. The pt notes that she doesn't like the food she is offered at her nursing home.   The pt also notes that she has been very tired recently, more so in the last week.   Lab results today (09/27/17) of CBC w/diff, CMP is as follows: all values are WNL except for WBC at 1.3k, RBC at 3.39, HGB at 9.5, HCT at 27.6, RDW at 14.9, PLT at 118k, ANC at 500, Lymphs abs at 300, Sodium at 130, CO2 at 17, Glucose at 124, BUN at 67, Creatinine at 6.03, Calcium at 8.2, Total Protein at 4.9, Albumin at 2.0, AST at 13, GFR at 6. Magnesium 09/27/17 is WNL at 2.3  On review of systems, pt reports fatigue, lack of appetite, failure to pass urine in 3 days, no bowel movements in 3 days, and denies any other symptoms.   MEDICAL HISTORY:  Past Medical History:  Diagnosis Date  . CHF (congestive heart failure) ([place])   . Diabetes mellitus   . Hypertension   . Thyroid disease   . Vasculitis ([place])     SURGICAL HISTORY: Past Surgical History:  Procedure Laterality Date  . IR FLUORO GUIDE PORT INSERTION RIGHT  07/26/2017  . IR US GUIDE VASC ACCESS RIGHT  07/26/2017  . MELANOMA EXCISION    . TUBAL LIGATION      SOCIAL HISTORY: Social History   Socioeconomic History  . Marital status: Divorced    Spouse name: Not on file  . Number of children: Not on file  . Years of education: Not on file  . Highest education level: Not on file  Occupational History  . Occupation: Retired  Scientific laboratory technician  . Financial resource strain: Not on file  . Food insecurity:    Worry: Not on file    Inability: Not on file  . Transportation needs:    Medical: Not on file    Non-medical: Not on file  Tobacco Use  . Smoking status: Never Smoker  . Smokeless tobacco: Never Used  Substance and Sexual Activity  . Alcohol use: No  .  Drug use: No  . Sexual activity: Not Currently  Lifestyle  . Physical activity:    Days per week: Not on file    Minutes per session: Not on file  . Stress: Not on file  Relationships  . Social connections:    Talks on phone: Not on file    Gets together: Not on file    Attends religious service: Not on file    Active member of club or organization: Not on file    Attends meetings of clubs or organizations: Not on file    Relationship status: Not on file  . Intimate partner violence:    Fear of current or ex partner: Not on file    Emotionally abused: Not on file    Physically abused: Not on file  Forced sexual activity: Not on file  Other Topics Concern  . Not on file  Social History Narrative  . Not on file    FAMILY HISTORY: Family History  Problem Relation Age of Onset  . Cancer Sister   . Diabetes Mellitus II Mother   . Tuberculosis Father     ALLERGIES:  is allergic to benadryl [diphenhydramine]; ciprofloxacin; furosemide; nitrofurantoin monohyd macro; septra [bactrim]; sulfa drugs cross reactors; allopurinol; and ceclor [cefaclor].  MEDICATIONS:  Current Outpatient Medications  Medication Sig Dispense Refill  . acetaminophen (TYLENOL) 325 MG tablet Take 2 tablets (650 mg total) by mouth every 6 (six) hours as needed for fever.    Marland Kitchen albuterol (PROVENTIL) (2.5 MG/3ML) 0.083% nebulizer solution Take 3 mLs (2.5 mg total) by nebulization every 4 (four) hours as needed for wheezing or shortness of breath. 75 mL 12  . amLODipine (NORVASC) 10 MG tablet Take 1 tablet (10 mg total) by mouth daily. 30 tablet 0  . amoxicillin-clavulanate (AUGMENTIN) 875-125 MG tablet Take 1 tablet by mouth every 12 (twelve) hours. 14 tablet 0  . Calcium-Magnesium-Vitamin D (CALCIUM 500 PO) Take 500 mg by mouth every morning.    . cholecalciferol (VITAMIN D) 400 units TABS tablet Take 400 Units by mouth daily.    Marland Kitchen diltiazem (CARDIZEM) 120 MG tablet Take 1 tablet (120 mg total) by mouth every  8 (eight) hours. 90 tablet 0  . famotidine (PEPCID) 20 MG tablet Take 1 tablet (20 mg total) by mouth 2 (two) times daily. 60 tablet 0  . feeding supplement, GLUCERNA SHAKE, (GLUCERNA SHAKE) LIQD Take 237 mLs by mouth 3 (three) times daily between meals. 30 Can 0  . ferrous sulfate 325 (65 FE) MG tablet Take 325 mg by mouth 2 (two) times daily with a meal.    . fluticasone (FLONASE) 50 MCG/ACT nasal spray Place 1 spray into both nostrils daily. 1 g 2  . hydrALAZINE (APRESOLINE) 100 MG tablet Take 100 mg by mouth 3 (three) times daily.    Marland Kitchen ipratropium-albuterol (DUONEB) 0.5-2.5 (3) MG/3ML SOLN Take 3 mLs by nebulization 2 (two) times daily. 360 mL 0  . levothyroxine (SYNTHROID, LEVOTHROID) 112 MCG tablet Take 224 mcg by mouth daily before breakfast.     . lidocaine-prilocaine (EMLA) cream Apply to affected area once 30 g 3  . Multiple Vitamin (MULTIVITAMIN) tablet Take 1 tablet by mouth daily.    . Omega-3 Fatty Acids (FISH OIL) 1000 MG CAPS Take 4,000 mg by mouth daily.    . ondansetron (ZOFRAN) 4 MG tablet Take 1 tablet (4 mg total) by mouth every 6 (six) hours as needed for nausea. 20 tablet 0  . ondansetron (ZOFRAN) 4 MG/2ML SOLN injection Inject 2 mLs (4 mg total) into the vein every 6 (six) hours as needed for nausea. 2 mL 0  . torsemide (DEMADEX) 10 MG tablet Take 2 tablets (20 mg total) by mouth daily. (Patient taking differently: Take 20 mg by mouth daily as needed (edema). ) 30 tablet 0  . traMADol (ULTRAM) 50 MG tablet Take 1 tablet (50 mg total) by mouth every 12 (twelve) hours as needed for moderate pain. 30 tablet 0  . vitamin B-12 (CYANOCOBALAMIN) 500 MCG tablet Take 500 mcg by mouth daily.    Marland Kitchen zinc gluconate 50 MG tablet Take 50 mg by mouth daily.     No current facility-administered medications for this visit.     REVIEW OF SYSTEMS:    A 10+ POINT REVIEW OF SYSTEMS WAS OBTAINED  including neurology, dermatology, psychiatry, cardiac, respiratory, lymph, extremities, GI, GU,  Musculoskeletal, constitutional, breasts, reproductive, HEENT.  All pertinent positives are noted in the HPI.  All others are negative.   PHYSICAL EXAMINATION: ECOG PERFORMANCE STATUS: 2 - Symptomatic, <50% confined to bed  . Vitals:   09/27/17 0845  BP: (!) 143/60  Pulse: 85  Resp: 18  Temp: 98.9 F (37.2 C)  SpO2: 94%   Filed Weights   09/27/17 0845  Weight: 194 lb 4.8 oz (88.1 kg)   .Body mass index is 33.35 kg/m.  GENERAL:alert, in no acute distress and comfortable SKIN: no acute rashes, no significant lesions EYES: conjunctiva are pink and non-injected, sclera anicteric OROPHARYNX: MMM, no exudates, no oropharyngeal erythema or ulceration NECK: supple, no JVD LYMPH:  no palpable lymphadenopathy in the cervical, axillary or inguinal regions LUNGS: clear to auscultation b/l with normal respiratory effort HEART: regular rate & rhythm ABDOMEN:  normoactive bowel sounds , tender to palpation in RUQ, not distended.  Extremity: 2+ pedal edema PSYCH: alert & oriented x 3 with fluent speech NEURO: no focal motor/sensory deficits   LABORATORY DATA:  I have reviewed the data as listed  . CBC Latest Ref Rng & Units 09/27/2017 09/05/2017 09/04/2017  WBC 3.9 - 10.3 K/uL 1.3(L) 3.8(L) 3.1(L)  Hemoglobin 11.6 - 15.9 g/dL 9.5(L) 8.9(L) 9.3(L)  Hematocrit 34.8 - 46.6 % 27.6(L) 26.3(L) 27.8(L)  Platelets 145 - 400 K/uL 118(L) 116(L) 86(L)    . CMP Latest Ref Rng & Units 09/27/2017 09/05/2017 09/04/2017  Glucose 70 - 99 mg/dL 124(H) 146(H) 159(H)  BUN 8 - 23 mg/dL 67(H) 44(H) 44(H)  Creatinine 0.44 - 1.00 mg/dL 6.03(HH) 1.41(H) 1.51(H)  Sodium 135 - 145 mmol/L 130(L) 134(L) 132(L)  Potassium 3.5 - 5.1 mmol/L 4.7 4.7 4.7  Chloride 98 - 111 mmol/L 99 107 107  CO2 22 - 32 mmol/L 17(L) 21(L) 21(L)  Calcium 8.9 - 10.3 mg/dL 8.2(L) 7.7(L) 7.6(L)  Total Protein 6.5 - 8.1 g/dL 4.9(L) - -  Total Bilirubin 0.3 - 1.2 mg/dL 0.4 - -  Alkaline Phos 38 - 126 U/L 89 - -  AST 15 - 41 U/L 13(L) -  -  ALT 0 - 44 U/L 8 - -   06/22/17 BM Bx:     06/22/17 Molecular Pathology:   06/22/17 Cytogenetics:     RADIOGRAPHIC STUDIES: I have personally reviewed the radiological images as listed and agreed with the findings in the report.   ASSESSMENT & PLAN:  72 y.o. female with  1. Myelodysplastic syndrome -Medication related MDS with patient history of taking Methotrexate 20 years ago ?  2. 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 MYD88 - undetected. -08/02/17 Hep B core antibody was positive, surface antigens negative -Completed C2 with Rituxan , and only one day of 2m/m2 Bendamustine (for 50% dose reduction) PLAN -Discussed pt labwork today, 09/27/17; ANC low at 500. HGB at 9.5. Creatinine at 6.03 - will send pt to the ED today with large bump in Creatinine -Will hold treatment as ANC and creatinine are prohibitive  3. Lymphedema  -Begin taking thyroid replacement with water only, and wait an hour and a half before eating.  -Suggest pt discuss her two calcium channel blocking medications with PCP Dr KMaudie Mercury -Will refer to dietician for severe protein malnutrition as well -08/30/17 Albumin at 2.1, Total Protein at 4.9  4. Increased Creatinine ARF on CKD  -- likely from poor po intake  -Will send  pt to the ED with Creatinine increased from 3 weeks ago at 1.41 to today at 6.03   -   Patient to be transported to the ED today for Acute renal failure RTC with Dr Irene Limbo based on clinical status -     All of the patients questions were answered with apparent satisfaction. The patient knows to call the clinic with any problems, questions or concerns.  . The total time spent in the appointment was 25 minutes and more than 50% was on counseling and direct patient cares.  Sullivan Lone MD MS AAHIVMS [facility] Hematology/Oncology Physician [facility]  (Office):       [phone] (Work cell):   [phone] (Fax):           [phone]  09/27/2017 9:15 AM  I, Baldwin [place], am acting as a Education administrator for Dr Irene Limbo.   .I have reviewed the above documentation for accuracy and completeness, and I agree with the above. Brunetta Genera MD

## 2017-09-27 ENCOUNTER — Inpatient Hospital Stay: Payer: Medicare Other

## 2017-09-27 ENCOUNTER — Inpatient Hospital Stay: Payer: Medicare Other | Attending: Hematology and Oncology

## 2017-09-27 ENCOUNTER — Other Ambulatory Visit: Payer: Self-pay

## 2017-09-27 ENCOUNTER — Encounter (HOSPITAL_COMMUNITY): Payer: Self-pay | Admitting: *Deleted

## 2017-09-27 ENCOUNTER — Inpatient Hospital Stay (HOSPITAL_COMMUNITY)
Admission: EM | Admit: 2017-09-27 | Discharge: 2017-10-05 | DRG: 682 | Disposition: A | Discharge status: Skilled Nursing Facility | Payer: Medicare Other | Attending: Internal Medicine | Admitting: Internal Medicine

## 2017-09-27 ENCOUNTER — Inpatient Hospital Stay (HOSPITAL_BASED_OUTPATIENT_CLINIC_OR_DEPARTMENT_OTHER): Payer: Medicare Other | Admitting: Hematology

## 2017-09-27 ENCOUNTER — Emergency Department (HOSPITAL_COMMUNITY): Payer: Medicare Other

## 2017-09-27 VITALS — BP 143/60 | HR 85 | Temp 98.9°F | Resp 18 | Ht 64.0 in | Wt 194.3 lb

## 2017-09-27 DIAGNOSIS — C8300 Small cell B-cell lymphoma, unspecified site: Secondary | ICD-10-CM

## 2017-09-27 DIAGNOSIS — R339 Retention of urine, unspecified: Secondary | ICD-10-CM | POA: Diagnosis present

## 2017-09-27 DIAGNOSIS — N136 Pyonephrosis: Secondary | ICD-10-CM | POA: Diagnosis present

## 2017-09-27 DIAGNOSIS — M069 Rheumatoid arthritis, unspecified: Secondary | ICD-10-CM | POA: Diagnosis present

## 2017-09-27 DIAGNOSIS — I10 Essential (primary) hypertension: Secondary | ICD-10-CM | POA: Diagnosis present

## 2017-09-27 DIAGNOSIS — E118 Type 2 diabetes mellitus with unspecified complications: Secondary | ICD-10-CM | POA: Diagnosis present

## 2017-09-27 DIAGNOSIS — E1122 Type 2 diabetes mellitus with diabetic chronic kidney disease: Secondary | ICD-10-CM | POA: Diagnosis present

## 2017-09-27 DIAGNOSIS — Z66 Do not resuscitate: Secondary | ICD-10-CM | POA: Diagnosis present

## 2017-09-27 DIAGNOSIS — K5900 Constipation, unspecified: Secondary | ICD-10-CM | POA: Diagnosis present

## 2017-09-27 DIAGNOSIS — E1165 Type 2 diabetes mellitus with hyperglycemia: Secondary | ICD-10-CM | POA: Diagnosis present

## 2017-09-27 DIAGNOSIS — D61810 Antineoplastic chemotherapy induced pancytopenia: Secondary | ICD-10-CM | POA: Diagnosis not present

## 2017-09-27 DIAGNOSIS — E119 Type 2 diabetes mellitus without complications: Secondary | ICD-10-CM | POA: Diagnosis not present

## 2017-09-27 DIAGNOSIS — R188 Other ascites: Secondary | ICD-10-CM | POA: Diagnosis not present

## 2017-09-27 DIAGNOSIS — Z888 Allergy status to other drugs, medicaments and biological substances status: Secondary | ICD-10-CM | POA: Diagnosis not present

## 2017-09-27 DIAGNOSIS — N179 Acute kidney failure, unspecified: Secondary | ICD-10-CM

## 2017-09-27 DIAGNOSIS — I5032 Chronic diastolic (congestive) heart failure: Secondary | ICD-10-CM | POA: Diagnosis present

## 2017-09-27 DIAGNOSIS — K7460 Unspecified cirrhosis of liver: Secondary | ICD-10-CM | POA: Diagnosis present

## 2017-09-27 DIAGNOSIS — D469 Myelodysplastic syndrome, unspecified: Secondary | ICD-10-CM

## 2017-09-27 DIAGNOSIS — D61818 Other pancytopenia: Secondary | ICD-10-CM | POA: Diagnosis present

## 2017-09-27 DIAGNOSIS — E785 Hyperlipidemia, unspecified: Secondary | ICD-10-CM | POA: Diagnosis present

## 2017-09-27 DIAGNOSIS — I890 Lymphedema, not elsewhere classified: Secondary | ICD-10-CM | POA: Insufficient documentation

## 2017-09-27 DIAGNOSIS — Z79899 Other long term (current) drug therapy: Secondary | ICD-10-CM | POA: Insufficient documentation

## 2017-09-27 DIAGNOSIS — E440 Moderate protein-calorie malnutrition: Secondary | ICD-10-CM | POA: Diagnosis present

## 2017-09-27 DIAGNOSIS — E039 Hypothyroidism, unspecified: Secondary | ICD-10-CM | POA: Diagnosis present

## 2017-09-27 DIAGNOSIS — N390 Urinary tract infection, site not specified: Secondary | ICD-10-CM | POA: Diagnosis present

## 2017-09-27 DIAGNOSIS — E43 Unspecified severe protein-calorie malnutrition: Secondary | ICD-10-CM | POA: Insufficient documentation

## 2017-09-27 DIAGNOSIS — N183 Chronic kidney disease, stage 3 (moderate): Secondary | ICD-10-CM | POA: Diagnosis not present

## 2017-09-27 DIAGNOSIS — Z882 Allergy status to sulfonamides status: Secondary | ICD-10-CM

## 2017-09-27 DIAGNOSIS — Z7951 Long term (current) use of inhaled steroids: Secondary | ICD-10-CM

## 2017-09-27 DIAGNOSIS — Z881 Allergy status to other antibiotic agents status: Secondary | ICD-10-CM

## 2017-09-27 DIAGNOSIS — Z79891 Long term (current) use of opiate analgesic: Secondary | ICD-10-CM | POA: Diagnosis not present

## 2017-09-27 DIAGNOSIS — D472 Monoclonal gammopathy: Secondary | ICD-10-CM | POA: Insufficient documentation

## 2017-09-27 DIAGNOSIS — N1330 Unspecified hydronephrosis: Secondary | ICD-10-CM

## 2017-09-27 DIAGNOSIS — R69 Illness, unspecified: Secondary | ICD-10-CM

## 2017-09-27 DIAGNOSIS — Z7401 Bed confinement status: Secondary | ICD-10-CM

## 2017-09-27 DIAGNOSIS — Z6833 Body mass index (BMI) 33.0-33.9, adult: Secondary | ICD-10-CM

## 2017-09-27 DIAGNOSIS — K219 Gastro-esophageal reflux disease without esophagitis: Secondary | ICD-10-CM | POA: Diagnosis present

## 2017-09-27 DIAGNOSIS — E038 Other specified hypothyroidism: Secondary | ICD-10-CM | POA: Diagnosis not present

## 2017-09-27 DIAGNOSIS — M310 Hypersensitivity angiitis: Secondary | ICD-10-CM | POA: Insufficient documentation

## 2017-09-27 DIAGNOSIS — IMO0002 Reserved for concepts with insufficient information to code with codable children: Secondary | ICD-10-CM | POA: Diagnosis present

## 2017-09-27 DIAGNOSIS — I130 Hypertensive heart and chronic kidney disease with heart failure and stage 1 through stage 4 chronic kidney disease, or unspecified chronic kidney disease: Secondary | ICD-10-CM | POA: Diagnosis not present

## 2017-09-27 DIAGNOSIS — I2721 Secondary pulmonary arterial hypertension: Secondary | ICD-10-CM | POA: Diagnosis present

## 2017-09-27 DIAGNOSIS — N170 Acute kidney failure with tubular necrosis: Secondary | ICD-10-CM | POA: Diagnosis not present

## 2017-09-27 LAB — CMP (CANCER CENTER ONLY)
ALBUMIN: 2 g/dL — AB (ref 3.5–5.0)
ALT: 8 U/L (ref 0–44)
ANION GAP: 14 (ref 5–15)
AST: 13 U/L — ABNORMAL LOW (ref 15–41)
Alkaline Phosphatase: 89 U/L (ref 38–126)
BUN: 67 mg/dL — ABNORMAL HIGH (ref 8–23)
CO2: 17 mmol/L — ABNORMAL LOW (ref 22–32)
Calcium: 8.2 mg/dL — ABNORMAL LOW (ref 8.9–10.3)
Chloride: 99 mmol/L (ref 98–111)
Creatinine: 6.03 mg/dL (ref 0.44–1.00)
GFR, EST AFRICAN AMERICAN: 7 mL/min — AB (ref >60)
GFR, Estimated: 6 mL/min — ABNORMAL LOW (ref >60)
GLUCOSE: 124 mg/dL — AB (ref 70–99)
POTASSIUM: 4.7 mmol/L (ref 3.5–5.1)
SODIUM: 130 mmol/L — AB (ref 135–145)
Total Bilirubin: 0.4 mg/dL (ref 0.3–1.2)
Total Protein: 4.9 g/dL — ABNORMAL LOW (ref 6.5–8.1)

## 2017-09-27 LAB — CBC WITH DIFFERENTIAL/PLATELET
BASOS ABS: 0 10*3/uL (ref 0.0–0.1)
BASOS ABS: 0 10*3/uL (ref 0.0–0.1)
Basophils Relative: 1 %
Basophils Relative: 1 %
EOS ABS: 0 10*3/uL (ref 0.0–0.7)
Eosinophils Absolute: 0 10*3/uL (ref 0.0–0.5)
Eosinophils Relative: 3 %
Eosinophils Relative: 2 %
HCT: 23.2 % — ABNORMAL LOW (ref 36.0–46.0)
HEMATOCRIT: 27.6 % — AB (ref 34.8–46.6)
HEMOGLOBIN: 9.5 g/dL — AB (ref 11.6–15.9)
HEMOGLOBIN: 8 g/dL — AB (ref 12.0–15.0)
LYMPHS ABS: 0.4 10*3/uL — AB (ref 0.7–4.0)
LYMPHS PCT: 26 %
Lymphocytes Relative: 30 %
Lymphs Abs: 0.3 10*3/uL — ABNORMAL LOW (ref 0.9–3.3)
MCH: 28 pg (ref 25.1–34.0)
MCH: 28.2 pg (ref 26.0–34.0)
MCHC: 34.4 g/dL (ref 31.5–36.0)
MCHC: 34.5 g/dL (ref 30.0–36.0)
MCV: 81.4 fL (ref 79.5–101.0)
MCV: 81.7 fL (ref 78.0–100.0)
MONO ABS: 0.4 10*3/uL (ref 0.1–0.9)
MONOS PCT: 32 %
MONOS PCT: 34 %
Monocytes Absolute: 0.4 10*3/uL (ref 0.1–1.0)
NEUTROS ABS: 0.5 10*3/uL — AB (ref 1.5–6.5)
NEUTROS ABS: 0.4 10*3/uL — AB (ref 1.7–7.7)
NEUTROS PCT: 38 %
Neutrophils Relative %: 33 %
Platelets: 118 10*3/uL — ABNORMAL LOW (ref 145–400)
Platelets: 122 10*3/uL — ABNORMAL LOW (ref 150–400)
RBC: 3.39 MIL/uL — ABNORMAL LOW (ref 3.70–5.45)
RBC: 2.84 MIL/uL — AB (ref 3.87–5.11)
RDW: 14.9 % — AB (ref 11.2–14.5)
RDW: 14.9 % (ref 11.5–15.5)
WBC: 1.3 10*3/uL — ABNORMAL LOW (ref 3.9–10.3)
WBC: 1.2 10*3/uL — AB (ref 4.0–10.5)

## 2017-09-27 LAB — PROTIME-INR
INR: 1.19
PROTHROMBIN TIME: 15 s (ref 11.4–15.2)

## 2017-09-27 LAB — COMPREHENSIVE METABOLIC PANEL
ALBUMIN: 1.9 g/dL — AB (ref 3.5–5.0)
ALK PHOS: 70 U/L (ref 38–126)
ALT: 10 U/L (ref 0–44)
AST: 13 U/L — AB (ref 15–41)
Anion gap: 12 (ref 5–15)
BILIRUBIN TOTAL: 0.6 mg/dL (ref 0.3–1.2)
BUN: 69 mg/dL — AB (ref 8–23)
CALCIUM: 7.8 mg/dL — AB (ref 8.9–10.3)
CO2: 19 mmol/L — ABNORMAL LOW (ref 22–32)
Chloride: 100 mmol/L (ref 98–111)
Creatinine, Ser: 6.29 mg/dL — ABNORMAL HIGH (ref 0.44–1.00)
GFR calc Af Amer: 7 mL/min — ABNORMAL LOW (ref >60)
GFR, EST NON AFRICAN AMERICAN: 6 mL/min — AB (ref >60)
GLUCOSE: 120 mg/dL — AB (ref 70–99)
POTASSIUM: 4.6 mmol/L (ref 3.5–5.1)
Sodium: 131 mmol/L — ABNORMAL LOW (ref 135–145)
Total Protein: 4.6 g/dL — ABNORMAL LOW (ref 6.5–8.1)

## 2017-09-27 LAB — URINALYSIS, ROUTINE W REFLEX MICROSCOPIC
Bilirubin Urine: NEGATIVE
GLUCOSE, UA: NEGATIVE mg/dL
Ketones, ur: NEGATIVE mg/dL
Nitrite: NEGATIVE
PROTEIN: 100 mg/dL — AB
SPECIFIC GRAVITY, URINE: 1.012 (ref 1.005–1.030)
pH: 5 (ref 5.0–8.0)

## 2017-09-27 LAB — SAMPLE TO BLOOD BANK

## 2017-09-27 LAB — MAGNESIUM: MAGNESIUM: 2.3 mg/dL (ref 1.7–2.4)

## 2017-09-27 MED ORDER — SODIUM CHLORIDE 0.9 % IV BOLUS
1000.0000 mL | Freq: Once | INTRAVENOUS | Status: AC
  Administered 2017-09-27: 1000 mL via INTRAVENOUS

## 2017-09-27 MED ORDER — ACETAMINOPHEN 325 MG PO TABS: 650.0000 mg | ORAL_TABLET | Freq: Four times a day (QID) | ORAL | Status: DC | PRN

## 2017-09-27 MED ORDER — IPRATROPIUM-ALBUTEROL 0.5-2.5 (3) MG/3ML IN SOLN
3.0000 mL | Freq: Two times a day (BID) | RESPIRATORY_TRACT | Status: DC
  Administered 2017-09-27 – 2017-09-28 (×2): 3 mL via RESPIRATORY_TRACT
  Filled 2017-09-27 (×2): qty 3

## 2017-09-27 MED ORDER — ALBUTEROL SULFATE (2.5 MG/3ML) 0.083% IN NEBU: 2.5000 mg | INHALATION_SOLUTION | RESPIRATORY_TRACT | Status: DC | PRN

## 2017-09-27 MED ORDER — LEVOTHYROXINE SODIUM 112 MCG PO TABS
224.0000 ug | ORAL_TABLET | Freq: Every day | ORAL | Status: DC
  Administered 2017-09-28 – 2017-10-05 (×8): 224 ug via ORAL
  Filled 2017-09-27 (×8): qty 2

## 2017-09-27 MED ORDER — FLUTICASONE PROPIONATE 50 MCG/ACT NA SUSP
1.0000 | Freq: Every day | NASAL | Status: DC
  Administered 2017-09-27 – 2017-10-04 (×8): 1 via NASAL
  Filled 2017-09-27: qty 16

## 2017-09-27 MED ORDER — HYDRALAZINE HCL 50 MG PO TABS
100.0000 mg | ORAL_TABLET | Freq: Three times a day (TID) | ORAL | Status: DC
  Administered 2017-09-27 – 2017-10-05 (×24): 100 mg via ORAL
  Filled 2017-09-27 (×24): qty 2

## 2017-09-27 MED ORDER — FAMOTIDINE 20 MG PO TABS
20.0000 mg | ORAL_TABLET | Freq: Two times a day (BID) | ORAL | Status: DC
  Administered 2017-09-27: 20 mg via ORAL
  Filled 2017-09-27: qty 1

## 2017-09-27 MED ORDER — PIPERACILLIN-TAZOBACTAM IN DEX 2-0.25 GM/50ML IV SOLN
2.2500 g | Freq: Once | INTRAVENOUS | Status: AC
  Administered 2017-09-27: 2.25 g via INTRAVENOUS
  Filled 2017-09-27: qty 50

## 2017-09-27 MED ORDER — ADULT MULTIVITAMIN W/MINERALS CH
1.0000 | ORAL_TABLET | Freq: Every day | ORAL | Status: DC
  Administered 2017-09-28 – 2017-10-05 (×8): 1 via ORAL
  Filled 2017-09-27 (×8): qty 1

## 2017-09-27 MED ORDER — SODIUM CHLORIDE 0.9 % IV SOLN
Freq: Once | INTRAVENOUS | Status: AC
  Administered 2017-09-29: 06:00:00 via INTRAVENOUS

## 2017-09-27 MED ORDER — OXYCODONE HCL 5 MG PO TABS
10.0000 mg | ORAL_TABLET | Freq: Four times a day (QID) | ORAL | Status: DC | PRN
  Administered 2017-09-27 – 2017-10-05 (×8): 10 mg via ORAL
  Filled 2017-09-27 (×9): qty 2

## 2017-09-27 MED ORDER — ACETAMINOPHEN 650 MG RE SUPP: 650.0000 mg | Freq: Four times a day (QID) | RECTAL | Status: DC | PRN

## 2017-09-27 MED ORDER — GLUCERNA SHAKE PO LIQD
237.0000 mL | Freq: Three times a day (TID) | ORAL | Status: DC
  Administered 2017-09-28 – 2017-10-04 (×8): 237 mL via ORAL
  Filled 2017-09-27 (×24): qty 237

## 2017-09-27 MED ORDER — CHOLECALCIFEROL 10 MCG (400 UNIT) PO TABS
400.0000 [IU] | ORAL_TABLET | Freq: Every day | ORAL | Status: DC
Start: 2017-09-27 — End: 2017-10-05
  Administered 2017-09-27 – 2017-10-05 (×9): 400 [IU] via ORAL
  Filled 2017-09-27 (×9): qty 1

## 2017-09-27 MED ORDER — SODIUM CHLORIDE 0.9 % IV SOLN
INTRAVENOUS | Status: AC
  Administered 2017-09-27 – 2017-09-28 (×3): via INTRAVENOUS

## 2017-09-27 MED ORDER — FERROUS SULFATE 325 (65 FE) MG PO TABS
325.0000 mg | ORAL_TABLET | Freq: Two times a day (BID) | ORAL | Status: DC
Start: 2017-09-28 — End: 2017-10-05
  Administered 2017-09-28 – 2017-10-05 (×15): 325 mg via ORAL
  Filled 2017-09-27 (×16): qty 1

## 2017-09-27 MED ORDER — PIPERACILLIN-TAZOBACTAM IN DEX 2-0.25 GM/50ML IV SOLN
2.2500 g | Freq: Three times a day (TID) | INTRAVENOUS | Status: DC
  Administered 2017-09-28 – 2017-09-30 (×7): 2.25 g via INTRAVENOUS
  Filled 2017-09-27 (×8): qty 50

## 2017-09-27 MED ORDER — CYANOCOBALAMIN 500 MCG PO TABS
500.0000 ug | ORAL_TABLET | Freq: Every day | ORAL | Status: DC
Start: 2017-09-27 — End: 2017-10-05
  Administered 2017-09-27 – 2017-10-05 (×9): 500 ug via ORAL
  Filled 2017-09-27 (×9): qty 1

## 2017-09-27 MED ORDER — SENNA 8.6 MG PO TABS
2.0000 | ORAL_TABLET | Freq: Two times a day (BID) | ORAL | Status: DC | PRN
  Administered 2017-10-01: 17.2 mg via ORAL
  Filled 2017-09-27: qty 2

## 2017-09-27 MED ORDER — AMLODIPINE BESYLATE 10 MG PO TABS
10.0000 mg | ORAL_TABLET | Freq: Every day | ORAL | Status: DC
  Administered 2017-09-28 – 2017-10-05 (×8): 10 mg via ORAL
  Filled 2017-09-27 (×8): qty 1

## 2017-09-27 MED ORDER — DILTIAZEM HCL 60 MG PO TABS
120.0000 mg | ORAL_TABLET | Freq: Three times a day (TID) | ORAL | Status: DC
  Administered 2017-09-27 – 2017-10-05 (×24): 120 mg via ORAL
  Filled 2017-09-27 (×21): qty 2
  Filled 2017-09-27: qty 4
  Filled 2017-09-27 (×2): qty 2

## 2017-09-27 MED ORDER — ZINC GLUCONATE 50 MG PO TABS: 50.0000 mg | ORAL_TABLET | Freq: Every day | ORAL | Status: DC

## 2017-09-27 MED ORDER — ONDANSETRON HCL 4 MG PO TABS
4.0000 mg | ORAL_TABLET | Freq: Four times a day (QID) | ORAL | Status: DC
  Administered 2017-09-27 – 2017-09-28 (×2): 4 mg via ORAL
  Filled 2017-09-27 (×2): qty 1

## 2017-09-27 MED ORDER — CHLORHEXIDINE GLUCONATE 0.12 % MT SOLN
15.0000 mL | Freq: Two times a day (BID) | OROMUCOSAL | Status: DC
  Administered 2017-09-27 – 2017-10-05 (×15): 15 mL via OROMUCOSAL
  Filled 2017-09-27 (×16): qty 15

## 2017-09-27 NOTE — ED Provider Notes (Signed)
[facility] DEPT Provider Note   CSN: [id number] Arrival date & time: 09/27/17  0947     History   Chief Complaint Chief Complaint  Patient presents with  . Abnormal Lab    Cr 6.03  . Urinary Retention    HPI Emily Kennedy is a 72 y.o. female.  HPI Patient is referred to the emergency department for lab abnormality.  Patient was seen at her oncologist office today where acute renal failure identified with creatinine going from 1.413 weeks ago to 6.03 today.  Patient has been having chronic symptoms associated with lymphoma being treated by chemotherapy.  She has chronic severe lymphedema. Past Medical History:  Diagnosis Date  . CHF (congestive heart failure) ([place])   . Diabetes mellitus   . Hypertension   . Thyroid disease   . Vasculitis [facility])     Patient Active Problem List   Diagnosis Date Noted  . Abdominal distension   . Anasarca 09/01/2017  . Thrombocytopenia ([place]) 09/01/2017  . Malignant lymphoma ([place]) 09/01/2017  . Fever 09/01/2017  . UTI (urinary tract infection) 08/31/2017  . 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 MDS (myelodysplastic syndrome) ([place]) 07/14/2017  . Solitary pulmonary nodule 02/15/2016  . CHF (congestive heart failure) ([place]) 02/14/2016  . Malnutrition of moderate degree 09/18/2015  . Benign essential HTN   . Vasculitis ([place])   . Rheumatoid arthritis ([place])   . History of recent hospitalization   . Acute blood loss anemia   . Anemia of chronic disease   . Hyponatremia 09/15/2015  . HCAP (healthcare-associated pneumonia) 09/14/2015  . Respiratory arrest ([place]) intubated 4/13 post arrest 06/27/2015  . Cardiac arrest ([place]) 4/13 suspected mucus plugging as cause of arrest 06/27/2015  . Controlled diabetes mellitus type 2 with complications ([place])   . Dysphagia   . Atrial fibrillation with RVR ([place]) 06/18/2015  .  Chronic diastolic CHF (congestive heart failure) ([place])   . Acute encephalopathy 06/14/2015  . Anemia, chronic disease 05/18/2015  . Hypothyroidism 05/18/2015  . Essential hypertension 05/18/2015  . Diabetes mellitus type 2, uncontrolled ([place]) 04/09/2015  . 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 IR FLUORO GUIDE PORT INSERTION RIGHT  07/26/2017  . IR US GUIDE VASC ACCESS RIGHT  07/26/2017  . MELANOMA EXCISION    . TUBAL LIGATION       OB History   None      Home Medications    Prior to Admission medications   Medication Sig Start Date End Date Taking? Authorizing Provider  acetaminophen (TYLENOL) 325 MG tablet Take 2 tablets (650 mg total) by mouth every 6 (six) hours as needed for fever. 09/19/15  Yes Theodis Blaze, MD  albuterol (PROVENTIL) (2.5 MG/3ML) 0.083% nebulizer solution Take 3 mLs (2.5 mg total) by nebulization every 4 (four) hours as needed for wheezing or shortness of breath. 09/06/17  Yes Georgette Shell, MD  amLODipine (NORVASC) 10 MG tablet Take 1 tablet (10 mg total) by mouth daily. 09/07/17  Yes Georgette Shell, MD  calcium carbonate (TUMS - DOSED IN MG ELEMENTAL CALCIUM) 500 MG chewable tablet Chew 1 tablet by mouth daily.   Yes [provider]  chlorhexidine (PERIDEX) 0.12 % solution Use as directed 15 mLs in the mouth or throat 2 (two) times daily.   Yes [provider]  cholecalciferol (VITAMIN D) 400 units TABS  tablet Take 400 Units by mouth daily.   Yes [provider]  diltiazem (CARDIZEM) 120 MG tablet Take 1 tablet (120 mg total) by mouth every 8 (eight) hours. 09/06/17  Yes Georgette Shell, MD  famotidine (PEPCID) 20 MG tablet Take 1 tablet (20 mg total) by mouth 2 (two) times daily. 09/06/17  Yes Georgette Shell, MD  feeding supplement, GLUCERNA SHAKE, (GLUCERNA SHAKE) LIQD Take 237 mLs by mouth 3 (three) times daily between meals.  09/06/17  Yes Georgette Shell, MD  ferrous sulfate 325 (65 FE) MG tablet Take 325 mg by mouth 2 (two) times daily with a meal.   Yes [provider]  fluticasone (FLONASE) 50 MCG/ACT nasal spray Place 1 spray into both nostrils daily. 09/07/17  Yes Georgette Shell, MD  hydrALAZINE (APRESOLINE) 100 MG tablet Take 100 mg by mouth 3 (three) times daily.   Yes [provider]  ipratropium-albuterol (DUONEB) 0.5-2.5 (3) MG/3ML SOLN Take 3 mLs by nebulization 2 (two) times daily. 09/06/17  Yes Georgette Shell, MD  levothyroxine (SYNTHROID, LEVOTHROID) 112 MCG tablet Take 224 mcg by mouth daily before breakfast.    Yes [provider]  lidocaine-prilocaine (EMLA) cream Apply to affected area once Patient taking differently: Apply 1 application topically 2 (two) times daily.  07/24/17  Yes Ardath Sax, MD  Multiple Vitamin (MULTIVITAMIN) tablet Take 1 tablet by mouth daily.   Yes [provider]  Omega-3 Fatty Acids (FISH OIL) 1000 MG CAPS Take 4,000 mg by mouth daily.   Yes [provider]  ondansetron (ZOFRAN) 4 MG tablet Take 1 tablet (4 mg total) by mouth every 6 (six) hours as needed for nausea. Patient taking differently: Take 4 mg by mouth every 6 (six) hours.  09/06/17  Yes Georgette Shell, MD  Oxycodone HCl 10 MG TABS Take 10 mg by mouth every 6 (six) hours as needed (pain). 09/24/17 10/08/17 Yes [provider]  senna (SENOKOT) 8.6 MG TABS tablet Take 2 tablets by mouth 2 (two) times daily as needed for mild constipation.   Yes [provider]  torsemide (DEMADEX) 10 MG tablet Take 2 tablets (20 mg total) by mouth daily. 02/16/16  Yes Domenic Polite, MD  vitamin B-12 (CYANOCOBALAMIN) 500 MCG tablet Take 500 mcg by mouth daily.   Yes [provider]  zinc gluconate 50 MG tablet Take 50 mg by mouth daily.   Yes [provider]  amoxicillin-clavulanate (AUGMENTIN) 875-125 MG tablet Take 1 tablet by  mouth every 12 (twelve) hours. Patient not taking: Reported on 09/27/2017 09/06/17   Georgette Shell, MD    Family History Family History  Problem Relation Age of Onset  . Cancer Sister   . Diabetes Mellitus II Mother   . Tuberculosis Father     Social History Social History   Tobacco Use  . Smoking status: Never Smoker  . Smokeless tobacco: Never Used  Substance Use Topics  . Alcohol use: No  . Drug use: No     Allergies   Benadryl [diphenhydramine]; Ciprofloxacin; Furosemide; Nitrofurantoin monohyd macro; Septra [bactrim]; Sulfa drugs cross reactors; Allopurinol; and Ceclor [cefaclor]   Review of Systems Review of Systems 10 Systems reviewed and are negative for acute change except as noted in the HPI.   Physical Exam Updated Vital Signs BP (!) 145/66   Pulse 88   Temp 98.4 F (36.9 C) (Oral)   Resp 13   Ht 5\' 4"  (1.626 m)   Wt 88  kg (194 lb)   SpO2 97%   BMI 33.30 kg/m   Physical Exam  Constitutional:  Patient is fatigued in appearance but alert and interactive.  HENT:  Head: Normocephalic and atraumatic.  Eyes: EOM are normal.  Cardiovascular: Normal rate and regular rhythm.  Pulmonary/Chest: Effort normal.  Breath sounds soft.  Abdominal:  Abdomen has lower abdominal wall edema throughout.  Nontender.  Musculoskeletal:  Bilateral large edema 2-3+  Neurological: She is alert. She exhibits normal muscle tone.  Skin: Skin is warm and dry.  Psychiatric: She has a normal mood and affect.     ED Treatments / Results  Labs (all labs ordered are listed, but only abnormal results are displayed) Labs Reviewed  COMPREHENSIVE METABOLIC PANEL - Abnormal; Notable for the following components:      Result Value   Sodium 131 (*)    CO2 19 (*)    Glucose, Bld 120 (*)    BUN 69 (*)    Creatinine, Ser 6.29 (*)    Calcium 7.8 (*)    Total Protein 4.6 (*)    Albumin 1.9 (*)    AST 13 (*)    GFR calc non Af Amer 6 (*)    GFR calc Af Amer 7 (*)     All other components within normal limits  CBC WITH DIFFERENTIAL/PLATELET - Abnormal; Notable for the following components:   WBC 1.2 (*)    RBC 2.84 (*)    Hemoglobin 8.0 (*)    HCT 23.2 (*)    Platelets 122 (*)    All other components within normal limits  URINALYSIS, ROUTINE W REFLEX MICROSCOPIC - Abnormal; Notable for the following components:   Color, Urine AMBER (*)    APPearance HAZY (*)    Hgb urine dipstick SMALL (*)    Protein, ur 100 (*)    Leukocytes, UA MODERATE (*)    Bacteria, UA RARE (*)    All other components within normal limits  PROTIME-INR    EKG None  Radiology Dg Chest Port 1 View  Result Date: 09/27/2017 CLINICAL DATA:  Renal failure.  History of lymphoma. EXAM: PORTABLE CHEST 1 VIEW COMPARISON:  Chest x-ray dated September 01, 2017. FINDINGS: Unchanged right chest wall port catheter. Stable cardiomegaly. Resolved pulmonary edema. Unchanged small left greater than right pleural effusions with left lower lobe atelectasis. New atelectasis in the medial right lung base. No pneumothorax. No acute osseous abnormality. IMPRESSION: 1. Unchanged left greater than right pleural effusions. 2. Persistent left lower lobe consolidation/atelectasis. Increased medial right basilar atelectasis. 3. Resolved pulmonary edema. Electronically Signed   By: Titus Dubin M.D.   On: 09/27/2017 12:26    Procedures Procedures (including critical care time)  Medications Ordered in ED Medications - No data to display   Initial Impression / Assessment and Plan / ED Course  I have reviewed the triage vital signs and the nursing notes.  Pertinent labs & imaging results that were available during my care of the patient were reviewed by me and considered in my medical decision making (see chart for details).     Consult: Dr.Mikkhail for admission.  Reviewed case with Dr. Maye Hides.  She felt that work-up was incomplete and renal ultrasound and CT scan needed for her admission plan.  She  except the patient for admission and will plan for which hospital placement based on test results.  Final Clinical Impressions(s) / ED Diagnoses   Final diagnoses:  Acute renal failure, unspecified acute renal failure type ([place])  Severe comorbid illness   Patient referred to the emergency department from oncology for acute renal failure.  Patient does not have significant complaints different from her baseline.  She is chronically severely ill but had not been able to discern any immediate changes.  Diagnostic results did confirm acute renal failure with large jump in creatinine.  Bladder scan done by nursing showed greater than 966ml, Foley catheter inserted.  At that time, I felt patient was reasonably evaluated for admission and consulted hospitalist.  Patient accepted by Dr. Ree Kida, but additional diagnostic studies needed as outlined above for determining which hospital patient should be admitted to. ED Discharge Orders    None       Charlesetta Shanks, MD 09/27/17 [phone]

## 2017-09-27 NOTE — ED Notes (Signed)
Bed: OY24 Expected date:  Expected time:  Means of arrival:  Comments: Cancer center, creatine 6

## 2017-09-27 NOTE — ED Provider Notes (Signed)
I was contacted by the hospitalist team regarding results.  Patient had not been admitted and was awaiting results.  I reviewed her labs and imaging.  I suspect she had acute kidney injury secondary to obstructive uropathy, which is now resolved after Foley insertion.  Ultrasound shows some mild hydro-, but otherwise medical renal disease.  The Foley appears to be draining correctly.  UA does show possible UTI and she will be given antibiotics, admitted.  IV fluids given for her likely obstructive AK I.   Duffy Bruce, MD 09/27/17 [phone]

## 2017-09-27 NOTE — Progress Notes (Signed)
A consult was received from an ED physician for Zosyn per pharmacy dosing (for an indication other than meningitis). The patient's profile has been reviewed for ht/wt/allergies/indication/available labs. A one time order has been placed for the above antibiotics.  Further antibiotics/pharmacy consults should be ordered by admitting physician if indicated.                       Reuel Boom, PharmD, BCPS [phone] 09/27/2017, 6:05 PM

## 2017-09-27 NOTE — ED Notes (Signed)
ED TO INPATIENT HANDOFF REPORT  Name/Age/Gender Emily Kennedy 72 y.o. female  Code Status    Code Status Orders  (From admission, onward)        Start     Ordered   09/27/17 1850  Do not attempt resuscitation (DNR)  Continuous    Question Answer Comment  In the event of cardiac or respiratory ARREST Do not call a "code blue"   In the event of cardiac or respiratory ARREST Do not perform Intubation, CPR, defibrillation or ACLS   In the event of cardiac or respiratory ARREST Use medication by any route, position, wound care, and other measures to relive pain and suffering. May use oxygen, suction and manual treatment of airway obstruction as needed for comfort.      09/27/17 1851    Code Status History    Date Active Date Inactive Code Status Order ID Comments User Context   08/31/2017 2151 09/07/2017 0028 DNR [id number]  Norval Morton, MD ED   02/14/2016 2017 02/16/2016 1902 Full Code [id number]  Domenic Polite, MD Inpatient   09/14/2015 2122 09/19/2015 1838 Full Code [id number]  Reubin Milan, MD Inpatient   06/23/2015 2309 07/20/2015 1755 Full Code [id number]  Corey Harold Inpatient   06/14/2015 0321 06/23/2015 2309 Full Code [id number]  Edwin Dada, MD ED   05/17/2015 2246 05/20/2015 1934 Full Code [id number]  Norval Morton, MD Inpatient   04/09/2015 1449 04/11/2015 1702 Full Code [id number]  Cristal Ford, DO Inpatient   04/09/2015 0557 04/09/2015 1449 DNR [id number]  Rise Patience, MD Inpatient      Home/SNF/Other Skilled nursing facility  Chief Complaint abnormal lab  Level of Care/Admitting Diagnosis ED Disposition    ED Disposition Condition [place] [facility] Area: [facility] [[id number]]  Level of Care: Telemetry [5]  Admit to tele based on following criteria: Monitor for Ischemic changes  Diagnosis: ARF (acute renal failure) [facility]) [[id number]]  Admitting Physician: [name] [[id number]]  Attending Physician: [name] [phone]  Estimated length of stay: past midnight tomorrow  Certification:: I certify this patient will need inpatient services for at least 2 midnights  PT Class (Do Not Modify): Inpatient [101]  PT Acc Code (Do Not Modify): Private [1]       Medical History Past Medical History:  Diagnosis Date  . CHF (congestive heart failure) ([place])   . Diabetes mellitus   . Hypertension   . Thyroid disease   . Vasculitis ([facility])     Allergies Allergies  Allergen Reactions  . Benadryl [Diphenhydramine] Swelling  . Ciprofloxacin Hives  . Furosemide Itching, Swelling, Rash and Other (See Comments)    Made edema worse  . Nitrofurantoin Monohyd Macro Hives  . Septra [Bactrim] Hives  . Sulfa Drugs Cross Reactors Hives  . Allopurinol Itching  . Ceclor [Cefaclor] Rash    Breaks out in rash chest down.    IV Location/Drains/Wounds Patient Lines/Drains/Airways Status   Active Line/Drains/Airways    Name:   Placement date:   Placement time:   Site:   Days:   Implanted Port 07/26/17 Right Chest   07/26/17    1552    Chest   63   Urethral Catheter Rachel Covil NT Latex 14 Fr.   09/27/17    1359    Latex   less than 1          Labs/Imaging Results for orders placed or performed during the hospital encounter of 09/27/17 (from the past  48 hour(s))  Comprehensive metabolic panel     Status: Abnormal   Collection Time: 09/27/17  1:00 PM  Result Value Ref Range   Sodium 131 (L) 135 - 145 mmol/L   Potassium 4.6 3.5 - 5.1 mmol/L   Chloride 100 98 - 111 mmol/L    Comment: Please note change in reference range.   CO2 19 (L) 22 - 32 mmol/L   Glucose, Bld 120 (H) 70 - 99 mg/dL    Comment: Please note change in reference range.   BUN 69 (H) 8 - 23 mg/dL    Comment: Please note change in reference range.   Creatinine, Ser 6.29 (H) 0.44 - 1.00 mg/dL   Calcium 7.8 (L) 8.9 - 10.3 mg/dL   Total Protein 4.6 (L) 6.5 - 8.1 g/dL   Albumin 1.9 (L) 3.5 - 5.0 g/dL   AST 13 (L) 15 - 41 U/L   ALT 10 0 - 44 U/L     Comment: Please note change in reference range.   Alkaline Phosphatase 70 38 - 126 U/L   Total Bilirubin 0.6 0.3 - 1.2 mg/dL   GFR calc non Af Amer 6 (L) >60 mL/min   GFR calc Af Amer 7 (L) >60 mL/min    Comment: (NOTE) The eGFR has been calculated using the CKD EPI equation. This calculation has not been validated in all clinical situations. eGFR's persistently <60 mL/min signify possible Chronic Kidney Disease.    Anion gap 12 5 - 15    Comment: Performed at [facility], [place] [address] [place], [place] [address]  CBC with Differential     Status: Abnormal   Collection Time: 09/27/17  1:00 PM  Result Value Ref Range   WBC 1.2 (LL) 4.0 - 10.5 K/uL    Comment: REPEATED TO VERIFY CRITICAL RESULT CALLED TO, READ BACK BY AND VERIFIED WITH: S.BINGHAM AT 1449 ON 09/27/17 BY N.THOMPSON    RBC 2.84 (L) 3.87 - 5.11 MIL/uL   Hemoglobin 8.0 (L) 12.0 - 15.0 g/dL   HCT 23.2 (L) 36.0 - 46.0 %   MCV 81.7 78.0 - 100.0 fL   MCH 28.2 26.0 - 34.0 pg   MCHC 34.5 30.0 - 36.0 g/dL   RDW 14.9 11.5 - 15.5 %   Platelets 122 (L) 150 - 400 K/uL   Neutrophils Relative % 33 %   Lymphocytes Relative 30 %   Monocytes Relative 34 %   Eosinophils Relative 2 %   Basophils Relative 1 %   Neutro Abs 0.4 (L) 1.7 - 7.7 K/uL   Lymphs Abs 0.4 (L) 0.7 - 4.0 K/uL   Monocytes Absolute 0.4 0.1 - 1.0 K/uL   Eosinophils Absolute 0.0 0.0 - 0.7 K/uL   Basophils Absolute 0.0 0.0 - 0.1 K/uL   RBC Morphology ELLIPTOCYTES     Comment: TEARDROP CELLS   WBC Morphology WHITE COUNT CONFIRMED ON SMEAR     Comment: TOXIC GRANULATION DOHLE BODIES Performed at [facility], [place] [address] [place], [place] [address]   Protime-INR     Status: None   Collection Time: 09/27/17  1:00 PM  Result Value Ref Range   Prothrombin Time 15.0 11.4 - 15.2 seconds   INR 1.19     Comment: Performed at [facility], [place] [address] [place], [place] [address]  Urinalysis, Routine w  reflex microscopic     Status: Abnormal   Collection Time: 09/27/17  2:03 PM  Result Value Ref Range  Color, Urine AMBER (A) YELLOW    Comment: BIOCHEMICALS MAY BE AFFECTED BY COLOR   APPearance HAZY (A) CLEAR   Specific Gravity, Urine 1.012 1.005 - 1.030   pH 5.0 5.0 - 8.0   Glucose, UA NEGATIVE NEGATIVE mg/dL   Hgb urine dipstick SMALL (A) NEGATIVE   Bilirubin Urine NEGATIVE NEGATIVE   Ketones, ur NEGATIVE NEGATIVE mg/dL   Protein, ur 100 (A) NEGATIVE mg/dL   Nitrite NEGATIVE NEGATIVE   Leukocytes, UA MODERATE (A) NEGATIVE   RBC / HPF 21-50 0 - 5 RBC/hpf   WBC, UA 21-50 0 - 5 WBC/hpf   Bacteria, UA RARE (A) NONE SEEN   WBC Clumps PRESENT    Budding Yeast PRESENT     Comment: Performed at [facility], [place] [address], [place], [place] [address]   US Renal  Result Date: 09/27/2017 CLINICAL DATA:  Acute kidney injury. History of diabetes, hypertension. EXAM: RENAL / URINARY TRACT ULTRASOUND COMPLETE COMPARISON:  CT abdomen and pelvis August 31, 2017 FINDINGS: Right Kidney: Length: 12 cm. Increased parenchymal echogenicity. Mild hydronephrosis. No suspicious mass. Left Kidney: Length: 9.8 cm. Increased parenchymal echogenicity. No suspicious mass or hydronephrosis visualized. Bladder: Foley catheter. Anechoic free fluid.  Nodular liver.  Anasarca. IMPRESSION: 1. Echogenic kidneys compatible with medical renal disease. 2. Mild RIGHT hydronephrosis. 3. Cirrhosis and ascites. Electronically Signed   By: Elon Alas M.D.   On: 09/27/2017 15:52   Dg Chest Port 1 View  Result Date: 09/27/2017 CLINICAL DATA:  Renal failure.  History of lymphoma. EXAM: PORTABLE CHEST 1 VIEW COMPARISON:  Chest x-ray dated September 01, 2017. FINDINGS: Unchanged right chest wall port catheter. Stable cardiomegaly. Resolved pulmonary edema. Unchanged small left greater than right pleural effusions with left lower lobe atelectasis. New atelectasis in the medial right lung base. No pneumothorax. No  acute osseous abnormality. IMPRESSION: 1. Unchanged left greater than right pleural effusions. 2. Persistent left lower lobe consolidation/atelectasis. Increased medial right basilar atelectasis. 3. Resolved pulmonary edema. Electronically Signed   By: Titus Dubin M.D.   On: 09/27/2017 12:26   Ct Renal Stone Study  Result Date: 09/27/2017 CLINICAL DATA:  72 year old female with malignant lymphoma. Difficulty going to the bathroom for the past couple days. Subsequent encounter. EXAM: CT ABDOMEN AND PELVIS WITHOUT CONTRAST TECHNIQUE: Multidetector CT imaging of the abdomen and pelvis was performed following the standard protocol without IV contrast. COMPARISON:  09/27/2017 CT.  08/31/2017 CT.  07/22/2017 PET-CT. FINDINGS: Lower chest: Bibasilar atelectasis versus infiltrates with small pleural effusions greater on the left. Medial right middle lobe atelectasis/scarring. Cardiomegaly. Central line tip caval atrial junction. Coronary artery calcifications. Aortic valve calcifications. Probable anemia given density of cardiac contents. Hepatobiliary: Taking into account limitation by non contrast imaging, no focal hepatic lesion. Radiopaque material and gallbladder may represent sludge. Pancreas: Taking into account limitation by non contrast imaging, no focal pancreatic lesion. Spleen: Taking into account limitation by non contrast imaging, splenomegaly without focal lesion. Adrenals/Urinary Tract: Mild right-sided hydronephrosis. Etiology indeterminate as no obstructing calcified stone is noted. Bladder decompressed by Foley catheter. Taking into account limitation by non contrast imaging, no renal or adrenal mass. Tiny nonobstructing left renal calculus versus vascular calcification. Stomach/Bowel: Evaluation of bowel limited by ascites and third spacing of fluid. Contrast is seen throughout the colon suggesting no high-grade obstructing lesion. Stomach tenting along the anterior wall which may be related to  prior gastrostomy placement. Small fat containing hernia with overlying skin defect inferior to this level unchanged. Vascular/Lymphatic: Atherosclerotic  changes aorta and aortic branch vessels. No abdominal aortic aneurysm. Increase number of normal size lymph nodes retroperitoneal and mesenteric region. Reproductive: No adnexal or uterine mass noted. Other: Moderate amount of ascites and third spacing of fluid. No free intraperitoneal air. Musculoskeletal: No acute osseous abnormality. IMPRESSION: Mild right-sided hydronephrosis. Etiology indeterminate as no obstructing calcified stone is noted. Contrast throughout colon without evidence of obstruction. Ascites and third spacing of fluid. Splenomegaly. Bibasilar atelectasis/infiltrates with small pleural effusions. Cardiomegaly. Aortic Atherosclerosis (ICD10-I70.0). Electronically Signed   By: Genia Del M.D.   On: 09/27/2017 17:08    Pending Labs Unresulted Labs (From admission, onward)   Start     Ordered   09/28/17 0500  Comprehensive metabolic panel  Tomorrow morning,   R     09/27/17 1851   09/28/17 0500  CBC  Tomorrow morning,   R     09/27/17 1851   09/27/17 1849  HIV antibody (Routine Testing)  Once,   R     09/27/17 1851   09/27/17 1736  Urine culture  STAT,   STAT    Question:  Patient immune status  Answer:  Normal   09/27/17 1737      Vitals/Pain Today's Vitals   09/27/17 1430 09/27/17 1530 09/27/17 1600 09/27/17 1842  BP: (!) 145/66 (!) 147/84 (!) 147/77   Pulse: 88 95 93   Resp: 13 (!) 21 12   Temp:      TempSrc:      SpO2: 97% 99% 97%   Weight:      Height:      PainSc:    0-No pain    Isolation Precautions No active isolations  Medications Medications  0.9 %  sodium chloride infusion (has no administration in time range)  0.9 %  sodium chloride infusion (has no administration in time range)  acetaminophen (TYLENOL) tablet 650 mg (has no administration in time range)    Or  acetaminophen (TYLENOL)  suppository 650 mg (has no administration in time range)  sodium chloride 0.9 % bolus 1,000 mL (1,000 mLs Intravenous New Bag/Given 09/27/17 1809)  piperacillin-tazobactam (ZOSYN) IVPB 2.25 g (0 g Intravenous Stopped 09/27/17 1859)    Mobility manual wheelchair

## 2017-09-27 NOTE — H&P (Signed)
TRH H&P   Patient Demographics:    Emily Kennedy, is a 72 y.o. female  MRN: [id number]   DOB - 12/08/1945  Admit Date - 09/27/2017  Outpatient Primary MD for the patient is [name], MD  Referring MD/NP/PA:  Lindell Noe  Outpatient Specialists:  [name]  Patient coming from: home  Chief Complaint  Patient presents with  . Abnormal Lab    Cr 6.03  . Urinary Retention      HPI:    Emily Kennedy  is a 72 y.o. female, 72 y.o.femalewith medical history significant ofHTN, HLD,CHF, DM type II, vasculitis, and malignant lymphoma currently receiving chemotherapy followed by [name] of oncology who presents with not feeling well.  Slight nausea.  Denies fever, chills, flank pain, diarrhea, brbpr, dysuria, hematuria.  Pt was brought to ED for evaluation.       In ED, pt noted to be in ARF.  Renal ultrasound IMPRESSION: 1. Echogenic kidneys compatible with medical renal disease. 2. Mild RIGHT hydronephrosis. 3. Cirrhosis and ascites.   CXR IMPRESSION: 1. Unchanged left greater than right pleural effusions. 2. Persistent left lower lobe consolidation/atelectasis. Increased medial right basilar atelectasis. 3. Resolved pulmonary edema.  Urinalysis wbc 21-50, rbc 21-50  Magnesium 2.3,  Na 130, K 4.7, Bun 67, Creatinine 6.03 Ast 13, Alt 8, Alb 2.0  Wbc 1.3, hgb 9.5, Plt 118  INR 1.19  Pt will be admitted for acute renal failure and uti, and pancytopenia.    Review of systems:    In addition to the HPI above,  No Fever-chills,   No Headache, No changes with Vision or hearing, No problems swallowing food or Liquids, No Chest pain, No Cough or Shortness of Breath, No Abdominal pain, No Nausea or Vommitting, Bowel movements are regular, No Blood in stool or Urine,   No new skin rashes or bruises, No new joints pains-aches,  No new weakness, tingling,  numbness in any extremity, No recent weight gain or loss, No polyuria, polydypsia or polyphagia, No significant Mental Stressors.  A full 10 point Review of Systems was done, except as stated above, all other Review of Systems were negative.   With Past History of the following :    Past Medical History:  Diagnosis Date  . CHF (congestive heart failure) ([place])   . Diabetes mellitus   . Hypertension   . Thyroid disease   . Vasculitis [facility] [place])       Past Surgical History:  Procedure Laterality Date  . IR FLUORO GUIDE PORT INSERTION RIGHT  07/26/2017  . IR US GUIDE VASC ACCESS RIGHT  07/26/2017  . MELANOMA EXCISION    . TUBAL LIGATION        Social History:     Social History   Tobacco Use  . Smoking status: Never Smoker  . Smokeless tobacco: Never Used  Substance Use Topics  . Alcohol  use: No     Lives - lives at home  Mobility - walks with assistance   Family History :     Family History  Problem Relation Age of Onset  . Cancer Sister   . Diabetes Mellitus II Mother   . Tuberculosis Father        Home Medications:   Prior to Admission medications   Medication Sig Start Date End Date Taking? Authorizing Provider  acetaminophen (TYLENOL) 325 MG tablet Take 2 tablets (650 mg total) by mouth every 6 (six) hours as needed for fever. 09/19/15  Yes Theodis Blaze, MD  albuterol (PROVENTIL) (2.5 MG/3ML) 0.083% nebulizer solution Take 3 mLs (2.5 mg total) by nebulization every 4 (four) hours as needed for wheezing or shortness of breath. 09/06/17  Yes Georgette Shell, MD  amLODipine (NORVASC) 10 MG tablet Take 1 tablet (10 mg total) by mouth daily. 09/07/17  Yes Georgette Shell, MD  calcium carbonate (TUMS - DOSED IN MG ELEMENTAL CALCIUM) 500 MG chewable tablet Chew 1 tablet by mouth daily.   Yes [provider]  chlorhexidine (PERIDEX) 0.12 % solution Use as directed 15 mLs in the mouth or throat 2 (two) times daily.   Yes [provider]    cholecalciferol (VITAMIN D) 400 units TABS tablet Take 400 Units by mouth daily.   Yes [provider]  diltiazem (CARDIZEM) 120 MG tablet Take 1 tablet (120 mg total) by mouth every 8 (eight) hours. 09/06/17  Yes Georgette Shell, MD  famotidine (PEPCID) 20 MG tablet Take 1 tablet (20 mg total) by mouth 2 (two) times daily. 09/06/17  Yes Georgette Shell, MD  feeding supplement, GLUCERNA SHAKE, (GLUCERNA SHAKE) LIQD Take 237 mLs by mouth 3 (three) times daily between meals. 09/06/17  Yes Georgette Shell, MD  ferrous sulfate 325 (65 FE) MG tablet Take 325 mg by mouth 2 (two) times daily with a meal.   Yes [provider]  fluticasone (FLONASE) 50 MCG/ACT nasal spray Place 1 spray into both nostrils daily. 09/07/17  Yes Georgette Shell, MD  hydrALAZINE (APRESOLINE) 100 MG tablet Take 100 mg by mouth 3 (three) times daily.   Yes [provider]  ipratropium-albuterol (DUONEB) 0.5-2.5 (3) MG/3ML SOLN Take 3 mLs by nebulization 2 (two) times daily. 09/06/17  Yes Georgette Shell, MD  levothyroxine (SYNTHROID, LEVOTHROID) 112 MCG tablet Take 224 mcg by mouth daily before breakfast.    Yes [provider]  lidocaine-prilocaine (EMLA) cream Apply to affected area once Patient taking differently: Apply 1 application topically 2 (two) times daily.  07/24/17  Yes Ardath Sax, MD  Multiple Vitamin (MULTIVITAMIN) tablet Take 1 tablet by mouth daily.   Yes [provider]  Omega-3 Fatty Acids (FISH OIL) 1000 MG CAPS Take 4,000 mg by mouth daily.   Yes [provider]  ondansetron (ZOFRAN) 4 MG tablet Take 1 tablet (4 mg total) by mouth every 6 (six) hours as needed for nausea. Patient taking differently: Take 4 mg by mouth every 6 (six) hours.  09/06/17  Yes Georgette Shell, MD  Oxycodone HCl 10 MG TABS Take 10 mg by mouth every 6 (six) hours as needed (pain). 09/24/17 10/08/17 Yes [provider]  senna (SENOKOT) 8.6 MG TABS  tablet Take 2 tablets by mouth 2 (two) times daily as needed for mild constipation.   Yes [provider]  torsemide (DEMADEX) 10 MG tablet Take 2 tablets (20 mg total) by mouth daily.  02/16/16  Yes Domenic Polite, MD  vitamin B-12 (CYANOCOBALAMIN) 500 MCG tablet Take 500 mcg by mouth daily.   Yes [provider]  zinc gluconate 50 MG tablet Take 50 mg by mouth daily.   Yes [provider]  amoxicillin-clavulanate (AUGMENTIN) 875-125 MG tablet Take 1 tablet by mouth every 12 (twelve) hours. Patient not taking: Reported on 09/27/2017 09/06/17   Georgette Shell, MD     Allergies:     Allergies  Allergen Reactions  . Benadryl [Diphenhydramine] Swelling  . Ciprofloxacin Hives  . Furosemide Itching, Swelling, Rash and Other (See Comments)    Made edema worse  . Nitrofurantoin Monohyd Macro Hives  . Septra [Bactrim] Hives  . Sulfa Drugs Cross Reactors Hives  . Allopurinol Itching  . Ceclor [Cefaclor] Rash    Breaks out in rash chest down.     Physical Exam:   Vitals  Blood pressure (!) 147/77, pulse 93, temperature 98.4 F (36.9 C), temperature source Oral, resp. rate 12, height 5\' 4"  (1.626 m), weight 88 kg (194 lb), SpO2 97 %.   1. General lying in bed in NAD,    2. Normal affect and insight, Not Suicidal or Homicidal, Awake Alert, Oriented X 3.  3. No F.N deficits, ALL C.Nerves Intact, Strength 5/5 all 4 extremities, Sensation intact all 4 extremities, Plantars down going.  4. Ears and Eyes appear Normal, Conjunctivae clear, PERRLA. Moist Oral Mucosa.  5. Supple Neck, No JVD, No cervical lymphadenopathy appriciated, No Carotid Bruits.  6. Symmetrical Chest wall movement, Good air movement bilaterally, CTAB.  7. RRR, No Gallops, Rubs or Murmurs, No Parasternal Heave.  8. Positive Bowel Sounds, Abdomen Soft, No tenderness, No organomegaly appriciated,No rebound -guarding or rigidity.  9.  No Cyanosis, Normal Skin Turgor, No Skin Rash or  Bruise.  10. Good muscle tone,  joints appear normal , no effusions, Normal ROM.  11. No Palpable Lymph Nodes in Neck or Axillae   No cva tenderness    Data Review:    CBC Recent Labs  Lab 09/27/17 0807 09/27/17 1300  WBC 1.3* 1.2*  HGB 9.5* 8.0*  HCT 27.6* 23.2*  PLT 118* 122*  MCV 81.4 81.7  MCH 28.0 28.2  MCHC 34.4 34.5  RDW 14.9* 14.9  LYMPHSABS 0.3* 0.4*  MONOABS 0.4 0.4  EOSABS 0.0 0.0  BASOSABS 0.0 0.0   ------------------------------------------------------------------------------------------------------------------  Chemistries  Recent Labs  Lab 09/27/17 0807 09/27/17 1300  NA 130* 131*  K 4.7 4.6  CL 99 100  CO2 17* 19*  GLUCOSE 124* 120*  BUN 67* 69*  CREATININE 6.03* 6.29*  CALCIUM 8.2* 7.8*  MG 2.3  --   AST 13* 13*  ALT 8 10  ALKPHOS 89 70  BILITOT 0.4 0.6   ------------------------------------------------------------------------------------------------------------------ estimated creatinine clearance is 8.9 mL/min (A) (by C-G formula based on SCr of 6.29 mg/dL (H)). ------------------------------------------------------------------------------------------------------------------ No results for input(s): TSH, T4TOTAL, T3FREE, THYROIDAB in the last 72 hours.  Invalid input(s): FREET3  Coagulation profile Recent Labs  Lab 09/27/17 1300  INR 1.19   ------------------------------------------------------------------------------------------------------------------- No results for input(s): DDIMER in the last 72 hours. -------------------------------------------------------------------------------------------------------------------  Cardiac Enzymes No results for input(s): CKMB, TROPONINI, MYOGLOBIN in the last 168 hours.  Invalid input(s): CK ------------------------------------------------------------------------------------------------------------------    Component Value Date/Time   BNP 305.3 (H) 08/14/2017 0901      ---------------------------------------------------------------------------------------------------------------  Urinalysis    Component Value Date/Time   COLORURINE AMBER (A) 09/27/2017 1403   APPEARANCEUR HAZY (A) 09/27/2017 1403   LABSPEC  1.012 09/27/2017 1403   PHURINE 5.0 09/27/2017 1403   GLUCOSEU NEGATIVE 09/27/2017 1403   HGBUR SMALL (A) 09/27/2017 1403   BILIRUBINUR NEGATIVE 09/27/2017 1403   KETONESUR NEGATIVE 09/27/2017 1403   PROTEINUR 100 (A) 09/27/2017 1403   UROBILINOGEN 1.0 01/06/2011 2252   NITRITE NEGATIVE 09/27/2017 1403   LEUKOCYTESUR MODERATE (A) 09/27/2017 1403    ----------------------------------------------------------------------------------------------------------------   Imaging Results:    US Renal  Result Date: 09/27/2017 CLINICAL DATA:  Acute kidney injury. History of diabetes, hypertension. EXAM: RENAL / URINARY TRACT ULTRASOUND COMPLETE COMPARISON:  CT abdomen and pelvis August 31, 2017 FINDINGS: Right Kidney: Length: 12 cm. Increased parenchymal echogenicity. Mild hydronephrosis. No suspicious mass. Left Kidney: Length: 9.8 cm. Increased parenchymal echogenicity. No suspicious mass or hydronephrosis visualized. Bladder: Foley catheter. Anechoic free fluid.  Nodular liver.  Anasarca. IMPRESSION: 1. Echogenic kidneys compatible with medical renal disease. 2. Mild RIGHT hydronephrosis. 3. Cirrhosis and ascites. Electronically Signed   By: Elon Alas M.D.   On: 09/27/2017 15:52   Dg Chest Port 1 View  Result Date: 09/27/2017 CLINICAL DATA:  Renal failure.  History of lymphoma. EXAM: PORTABLE CHEST 1 VIEW COMPARISON:  Chest x-ray dated September 01, 2017. FINDINGS: Unchanged right chest wall port catheter. Stable cardiomegaly. Resolved pulmonary edema. Unchanged small left greater than right pleural effusions with left lower lobe atelectasis. New atelectasis in the medial right lung base. No pneumothorax. No acute osseous abnormality. IMPRESSION: 1.  Unchanged left greater than right pleural effusions. 2. Persistent left lower lobe consolidation/atelectasis. Increased medial right basilar atelectasis. 3. Resolved pulmonary edema. Electronically Signed   By: Titus Dubin M.D.   On: 09/27/2017 12:26   Ct Renal Stone Study  Result Date: 09/27/2017 CLINICAL DATA:  72 year old female with malignant lymphoma. Difficulty going to the bathroom for the past couple days. Subsequent encounter. EXAM: CT ABDOMEN AND PELVIS WITHOUT CONTRAST TECHNIQUE: Multidetector CT imaging of the abdomen and pelvis was performed following the standard protocol without IV contrast. COMPARISON:  09/27/2017 CT.  08/31/2017 CT.  07/22/2017 PET-CT. FINDINGS: Lower chest: Bibasilar atelectasis versus infiltrates with small pleural effusions greater on the left. Medial right middle lobe atelectasis/scarring. Cardiomegaly. Central line tip caval atrial junction. Coronary artery calcifications. Aortic valve calcifications. Probable anemia given density of cardiac contents. Hepatobiliary: Taking into account limitation by non contrast imaging, no focal hepatic lesion. Radiopaque material and gallbladder may represent sludge. Pancreas: Taking into account limitation by non contrast imaging, no focal pancreatic lesion. Spleen: Taking into account limitation by non contrast imaging, splenomegaly without focal lesion. Adrenals/Urinary Tract: Mild right-sided hydronephrosis. Etiology indeterminate as no obstructing calcified stone is noted. Bladder decompressed by Foley catheter. Taking into account limitation by non contrast imaging, no renal or adrenal mass. Tiny nonobstructing left renal calculus versus vascular calcification. Stomach/Bowel: Evaluation of bowel limited by ascites and third spacing of fluid. Contrast is seen throughout the colon suggesting no high-grade obstructing lesion. Stomach tenting along the anterior wall which may be related to prior gastrostomy placement. Small fat  containing hernia with overlying skin defect inferior to this level unchanged. Vascular/Lymphatic: Atherosclerotic changes aorta and aortic branch vessels. No abdominal aortic aneurysm. Increase number of normal size lymph nodes retroperitoneal and mesenteric region. Reproductive: No adnexal or uterine mass noted. Other: Moderate amount of ascites and third spacing of fluid. No free intraperitoneal air. Musculoskeletal: No acute osseous abnormality. IMPRESSION: Mild right-sided hydronephrosis. Etiology indeterminate as no obstructing calcified stone is noted. Contrast throughout colon without evidence of obstruction. Ascites and third spacing of  fluid. Splenomegaly. Bibasilar atelectasis/infiltrates with small pleural effusions. Cardiomegaly. Aortic Atherosclerosis (ICD10-I70.0). Electronically Signed   By: Genia Del M.D.   On: 09/27/2017 17:08       Assessment & Plan:    Principal Problem:   ARF (acute renal failure) (HCC) Active Problems:   Pancytopenia ([place])   Acute lower UTI    ARF Hold Demadex Hydrate with ns iv Check cmp in am  UTI Blood culture x2 Urine culture unasyn iv pharmacy todose  Pancytopenia Check cbc in am  Hypertension Cont amlodipine 10mg  po qday Cont cardizem 120mg  po q8h Cont hydralazine 100mg  po tid  Hypothyroidism Cont levothyroxine 224 micrograms po qday  Gerd Cont H2 Blocker      DVT Prophylaxis SCDs  AM Labs Ordered, also please review Full Orders  Family Communication: Admission, patients condition and plan of care including tests being ordered have been discussed with the patient  who indicate understanding and agree with the plan and Code Status.  Code Status  FULL CODE  Likely DC to  home  Condition GUARDED   Consults called:   none  Admission status:  inpatient  Time spent in minutes : 96   [name] M.D on 09/27/2017 at 6:38 PM  Between 7am to 7pm - Pager - [phone]  . After 7pm go to [email or web address] - password  [facility]  [facility] - Office  [phone]

## 2017-09-27 NOTE — ED Triage Notes (Signed)
Pt sent from cancer center for elevated creatinine. Pt states it has been days since she last urinated.

## 2017-09-27 NOTE — Plan of Care (Addendum)
Received a call from EDP regarding patient requiring admission.  Patient creatinine elevated at 6.29.  Patient does require inpatient admission however admission at this time deferred due to incomplete work-up.  CT renal stone study as well as renal ultrasound ordered, pending results of those, TRH to be called back for admission.  Called back Dr. Ellender Hose approximately 1730, patient was not signed out to him.  He will review the case and call TRH accordingly.

## 2017-09-27 NOTE — Progress Notes (Signed)
Pharmacy Antibiotic Note  Emily Kennedy is a 72 y.o. female with PMH DM and lymphoma currently on chemo per [name], admitted on 09/27/2017 with hypertensive crisis and AKI; also noted to have asymptomatic pyuria. Pharmacy has been consulted for Zosyn dosing. Noted to be neutropenic.  Plan: Zosyn 2.25 g IV q8 hr   Height: 5\' 4"  (162.6 cm) Weight: 187 lb 6.3 oz (85 kg) IBW/kg (Calculated) : 54.7  Temp (24hrs), Avg:98.4 F (36.9 C), Min:97.8 F (36.6 C), Max:98.9 F (37.2 C)  Recent Labs  Lab 09/27/17 0807 09/27/17 1300  WBC 1.3* 1.2*  CREATININE 6.03* 6.29*    Estimated Creatinine Clearance: 8.8 mL/min (A) (by C-G formula based on SCr of 6.29 mg/dL (H)).    Allergies  Allergen Reactions  . Benadryl [Diphenhydramine] Swelling  . Ciprofloxacin Hives  . Furosemide Itching, Swelling, Rash and Other (See Comments)    Made edema worse  . Nitrofurantoin Monohyd Macro Hives  . Septra [Bactrim] Hives  . Sulfa Drugs Cross Reactors Hives  . Allopurinol Itching  . Ceclor [Cefaclor] Rash    Breaks out in rash chest down.    Antimicrobials this admission: 7/16 Zosyn>>   Dose adjustments this admission: n/a  Microbiology results: 7/16 UCx: sent    Thank you for allowing pharmacy to be a part of this patient's care.  Reuel Boom, PharmD, BCPS [phone] 09/27/2017, 9:42 PM

## 2017-09-27 NOTE — ED Triage Notes (Signed)
Per Erasmo Downer RN from [facility], Pt seen at [facility] this am. Labs drawn Creatinine 6.03. Pt reports urinary retention for several days. Doctor request pt to be evaluated in ED. Pt transferred from Cancer center.

## 2017-09-28 ENCOUNTER — Inpatient Hospital Stay: Payer: Medicare Other

## 2017-09-28 ENCOUNTER — Inpatient Hospital Stay (HOSPITAL_COMMUNITY): Payer: Medicare Other

## 2017-09-28 ENCOUNTER — Telehealth: Payer: Self-pay

## 2017-09-28 DIAGNOSIS — N170 Acute kidney failure with tubular necrosis: Secondary | ICD-10-CM

## 2017-09-28 LAB — COMPREHENSIVE METABOLIC PANEL
ALK PHOS: 63 U/L (ref 38–126)
ALT: 9 U/L (ref 0–44)
AST: 11 U/L — AB (ref 15–41)
Albumin: 1.6 g/dL — ABNORMAL LOW (ref 3.5–5.0)
Anion gap: 12 (ref 5–15)
BILIRUBIN TOTAL: 0.4 mg/dL (ref 0.3–1.2)
BUN: 60 mg/dL — AB (ref 8–23)
CHLORIDE: 103 mmol/L (ref 98–111)
CO2: 18 mmol/L — ABNORMAL LOW (ref 22–32)
Calcium: 7.8 mg/dL — ABNORMAL LOW (ref 8.9–10.3)
Creatinine, Ser: 4.81 mg/dL — ABNORMAL HIGH (ref 0.44–1.00)
GFR calc Af Amer: 10 mL/min — ABNORMAL LOW (ref >60)
GFR, EST NON AFRICAN AMERICAN: 8 mL/min — AB (ref >60)
Glucose, Bld: 115 mg/dL — ABNORMAL HIGH (ref 70–99)
Potassium: 3.9 mmol/L (ref 3.5–5.1)
Sodium: 133 mmol/L — ABNORMAL LOW (ref 135–145)
Total Protein: 4 g/dL — ABNORMAL LOW (ref 6.5–8.1)

## 2017-09-28 LAB — CBC
HEMATOCRIT: 24.3 % — AB (ref 36.0–46.0)
HEMOGLOBIN: 8.2 g/dL — AB (ref 12.0–15.0)
MCH: 27.9 pg (ref 26.0–34.0)
MCHC: 33.7 g/dL (ref 30.0–36.0)
MCV: 82.7 fL (ref 78.0–100.0)
Platelets: 123 10*3/uL — ABNORMAL LOW (ref 150–400)
RBC: 2.94 MIL/uL — ABNORMAL LOW (ref 3.87–5.11)
RDW: 15 % (ref 11.5–15.5)
WBC: 1.1 10*3/uL — CL (ref 4.0–10.5)

## 2017-09-28 LAB — GLUCOSE, CAPILLARY: Glucose-Capillary: 140 mg/dL — ABNORMAL HIGH (ref 70–99)

## 2017-09-28 LAB — HIV ANTIBODY (ROUTINE TESTING W REFLEX): HIV SCREEN 4TH GENERATION: NONREACTIVE

## 2017-09-28 LAB — PATHOLOGIST SMEAR REVIEW

## 2017-09-28 MED ORDER — HEPARIN SODIUM (PORCINE) 5000 UNIT/ML IJ SOLN
5000.0000 [IU] | Freq: Three times a day (TID) | INTRAMUSCULAR | Status: DC
  Administered 2017-09-28 – 2017-10-05 (×21): 5000 [IU] via SUBCUTANEOUS
  Filled 2017-09-28 (×21): qty 1

## 2017-09-28 MED ORDER — FAMOTIDINE 20 MG PO TABS
20.0000 mg | ORAL_TABLET | Freq: Every day | ORAL | Status: DC
  Administered 2017-09-28 – 2017-10-04 (×7): 20 mg via ORAL
  Filled 2017-09-28 (×7): qty 1

## 2017-09-28 MED ORDER — IPRATROPIUM-ALBUTEROL 0.5-2.5 (3) MG/3ML IN SOLN: 3.0000 mL | Freq: Four times a day (QID) | RESPIRATORY_TRACT | Status: DC | PRN

## 2017-09-28 MED ORDER — ONDANSETRON HCL 4 MG PO TABS
4.0000 mg | ORAL_TABLET | Freq: Four times a day (QID) | ORAL | Status: DC | PRN
  Administered 2017-09-30: 4 mg via ORAL
  Filled 2017-09-28: qty 1

## 2017-09-28 NOTE — Telephone Encounter (Signed)
RTC with Dr Irene Limbo based on clinical status - per 7/16 los

## 2017-09-28 NOTE — Evaluation (Signed)
Physical Therapy Evaluation Patient Details Name: Emily Kennedy MRN: [id number] DOB: 01/08/1946 Today's Date: 09/28/2017   History of Present Illness  72 y.o. female with medical history significant of HTN, HLD, CHF, DM type II, vasculitis, and malignant lymphoma currently receiving chemotherapy followed by [name] of oncology and admitted for acute renal failure  Clinical Impression  Pt admitted with above diagnosis. Pt currently with functional limitations due to the deficits listed below (see PT Problem List).  Pt will benefit from skilled PT to increase their independence and safety with mobility to allow discharge to the venue listed below.   Pt presents with limited endurance, decreased strength, and declining mobility.  Pt chart review and pt, pt was using a w/c prior to last admission however discharged and readmitted from SNF.  Pt reports she was not using w/c at SNF and mostly in bed.  Recommend return to SNF for continued rehab.     Follow Up Recommendations SNF    Equipment Recommendations  None recommended by PT    Recommendations for Other Services       Precautions / Restrictions Precautions Precautions: Fall      Mobility  Bed Mobility Overal bed mobility: Needs Assistance             General bed mobility comments: pt did not wish to attempt sitting EOB, requested pt attempt to use UEs to lift trunk off bed surface and she was only able to lift up to her shoulder blades using bil UE assist with bed rails  Transfers                    Ambulation/Gait                Stairs            Wheelchair Mobility    Modified Rankin (Stroke Patients Only)       Balance Overall balance assessment: Needs assistance                                           Pertinent Vitals/Pain Pain Assessment: Faces Faces Pain Scale: Hurts even more Pain Location: R foot tender to touch Pain Descriptors / Indicators: Tender Pain  Intervention(s): Limited activity within patient's tolerance;Repositioned;Monitored during session    Home Living Family/patient expects to be discharged to:: Skilled nursing facility Living Arrangements: Children(daughter) Available Help at Discharge: Family;Available 24 hours/day Type of Home: House Home Access: Level entry     Home Layout: One level Home Equipment: Walker - 2 wheels;Wheelchair - [employer];Shower seat;Grab bars - tub/shower;Hand held shower head;Other (comment) Additional Comments: bed rails    Prior Function Level of Independence: Needs assistance   Gait / Transfers Assistance Needed: was having assist for short distance ambulation up until more difficulty since 1 month; usually propels w/c with LE's on her own      Comments: **information above from 09/02/17 PT evaluation, pt currently admitted from SNF and reports she has been mostly bedbound, has not been in w/c, reports she has been working with therapy     Hand Dominance        Extremity/Trunk Assessment   Upper Extremity Assessment Upper Extremity Assessment: Generalized weakness    Lower Extremity Assessment Lower Extremity Assessment: Generalized weakness;RLE deficits/detail;LLE deficits/detail RLE Deficits / Details: grossly hip flexion 2/5, knee extension 3+/5, limited ankle DF due to  ankle pain, pt unable to perform active hip abduction and adduction, resistance felt with hip flexion, abduction and adduction AROM; bil LE in dressings RLE Sensation: history of peripheral neuropathy LLE Deficits / Details: grossly hip flexion 2+/5, knee extension 3+/5, pt does not have full active ankle ROM, pt unable to perform active hip abduction and adduction, resistance felt with hip flexion, abduction and adduction AROM; bil LE in dressings LLE Sensation: history of peripheral neuropathy       Communication   Communication: No difficulties  Cognition Arousal/Alertness: Awake/alert Behavior  During Therapy: Flat affect Overall Cognitive Status: No family/caregiver present to determine baseline cognitive functioning                                 General Comments: pt only orientated to self, however does state location of [place], pt reorientated      General Comments      Exercises     Assessment/Plan    PT Assessment Patient needs continued PT services  PT Problem List Decreased strength;Decreased mobility;Decreased activity tolerance;Decreased balance;Decreased knowledge of use of DME;Decreased range of motion;Decreased cognition       PT Treatment Interventions DME instruction;Functional mobility training;Balance training;Patient/family education;Therapeutic activities;Therapeutic exercise;Wheelchair mobility training    PT Goals (Current goals can be found in the Care Plan section)  Acute Rehab PT Goals PT Goal Formulation: With patient Time For Goal Achievement: 10/12/17 Potential to Achieve Goals: Good    Frequency Min 2X/week   Barriers to discharge        Co-evaluation               AM-PAC PT "6 Clicks" Daily Activity  Outcome Measure Difficulty turning over in bed (including adjusting bedclothes, sheets and blankets)?: Unable Difficulty moving from lying on back to sitting on the side of the bed? : Unable Difficulty sitting down on and standing up from a chair with arms (e.g., wheelchair, bedside commode, etc,.)?: Unable Help needed moving to and from a bed to chair (including a wheelchair)?: Total Help needed walking in hospital room?: Total Help needed climbing 3-5 steps with a railing? : Total 6 Click Score: 6    End of Session   Activity Tolerance: Patient limited by fatigue Patient left: in bed;with bed alarm set;with call bell/phone within reach   PT Visit Diagnosis: Other abnormalities of gait and mobility (R26.89);Muscle weakness (generalized) (M62.81)    Time: 1401-1420 PT Time Calculation (min) (ACUTE  ONLY): 19 min   Charges:   PT Evaluation $PT Eval Low Complexity: 1 Low     PT G Codes:        Carmelia Bake, PT, DPT 09/28/2017 Pager: [phone]  York Ram E 09/28/2017, 3:19 PM

## 2017-09-28 NOTE — Progress Notes (Signed)
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  She was admitted to [name] from the [facility] on 7/16 for elevated scr.  To start heparin SQ for VTE prophylaxis.  -  Scr down 4.81 (crcl~11) with hydration - hgb and plts are low but somewhat stable - no bleeding dpcumented  Plan: - heparin 5000 units SQ q8h - pharmacy will sign off for heparin.  Re-consult [place] if need further assistance  Dia Sitter, PharmD, BCPS 09/28/2017 11:00 AM

## 2017-09-28 NOTE — Progress Notes (Signed)
[facility] PROGRESS NOTE  Rachell Druckenmiller EOF:[id number] DOB: 1945/10/06 DOA: 09/27/2017   PCP: [name], MD     Assessment/Plan: Principal Problem:   ARF (acute renal failure) ([place]) Active Problems:   Pancytopenia ([place])   Acute lower UTI   72 y.o. female, 72 y.o.femalewith medical history significant ofHTN, HLD,CHF, DM type II, vasculitis, and malignant lymphoma currently receiving chemotherapy followed by [name] of oncology who presents with not feeling well.  Slight nausea.  Denies fever, chills, flank pain, diarrhea, brbpr, dysuria, hematuria.  Pt was brought to ED  Because of a creatinine of 6.29.urinary retention, constipation for 5 days.  Assessment and plan  ARF Patient referred from oncology for acute renal failure Bladder scan showed about 1 L of urine Foley catheter placed Patient also found to have a urinary tract infection CT stone protocol shows mild right-sided hydronephrosis Baseline creatinine around 1.2 Hold Demadex Hydrate with ns iv Check cmp in am  Constipation/urinary retention  patient had not had a bowel movement in 5 days Went into urinary retention at the same time Unable to assess strength of the lower extremities because of patient being bedbound at baseline Will order MRI of the lumbar spine and lumbosacral spine to rule out cord compression  UTI, currently on Zosyn Follow urine culture Blood culture x2 Urine culture    Pancytopenia White count 1.1, ANC around 500 hemoglobin and platelets are stable  Hypertension Cont amlodipine 10mg  po qday Cont cardizem 120mg  po q8h Cont hydralazine 100mg  po tid  Hypothyroidism Cont levothyroxine 224 micrograms po qday  Gerd Cont H2 Blocker    DVT prophylaxsis  heparin  Code Status:  DO NOT RESUSCITATE   Family Communication: Discussed in detail with the patient, all imaging results, lab results explained to the patient   Disposition Plan:  Several days pending  improvement      Consultants:  none  Procedures:  none  Antibiotics: Anti-infectives (From admission, onward)   Start     Dose/Rate Route Frequency Ordered Stop   09/28/17 0200  piperacillin-tazobactam (ZOSYN) IVPB 2.25 g     2.25 g 100 mL/hr over 30 Minutes Intravenous Every 8 hours 09/27/17 2136     09/27/17 1815  piperacillin-tazobactam (ZOSYN) IVPB 2.25 g     2.25 g 100 mL/hr over 30 Minutes Intravenous  Once 09/27/17 1804 09/27/17 1859         HPI/Subjective:  patient states that she has been unable to urinate and have a bowel movement for the last 5 days, denies any back pain  Objective: Vitals:   09/27/17 1930 09/27/17 2006 09/27/17 2235 09/28/17 0442  BP: (!) 164/77 (!) 167/95  131/64  Pulse: (!) 103 (!) 111  87  Resp: 15 18  20   Temp:  98.4 F (36.9 C)  98.6 F (37 C)  TempSrc:  Oral  Oral  SpO2: 97% 97% 96% 99%  Weight:  85 kg (187 lb 6.3 oz)    Height:  5\' 4"  (1.626 m)      Intake/Output Summary (Last 24 hours) at 09/28/2017 1040 Last data filed at 09/28/2017 0437 Gross per 24 hour  Intake 2090 ml  Output 2264 ml  Net -174 ml    Exam:  Examination:  General exam: Appears calm and comfortable  Respiratory system: Clear to auscultation. Respiratory effort normal. Cardiovascular system: S1 & S2 heard, RRR. No JVD, murmurs, rubs, gallops or clicks. No pedal edema. Gastrointestinal system: Abdomen is nondistended, soft and  nontender. No organomegaly or masses felt. Normal bowel sounds heard. Central nervous system: Alert and oriented. No focal neurological deficits. Extremities:  Strength 2/5 in bilateral lower extremities Skin: No rashes, lesions or ulcers Psychiatry: Judgement and insight appear normal. Mood & affect appropriate.     Data Reviewed: I have personally reviewed following labs and imaging studies  Micro Results No results found for this or any previous visit (from the past 240 hour(s)).  Radiology Reports Dg Chest 1  View  Result Date: 09/01/2017 CLINICAL DATA:  Congestive heart failure EXAM: CHEST  1 VIEW COMPARISON:  August 31, 2017 FINDINGS: The heart size and mediastinal contours are stable. Right central venous line is identified unchanged. Heart size is enlarged. There is pulmonary edema. Consolidation of left lung base with left pleural effusion is identified. Minimal right pleural effusion is noted. The visualized skeletal structures are stable. IMPRESSION: Congestive heart failure. Bilateral pleural effusions. Consolidation of left lung base underlying pneumonia is not excluded. Electronically Signed   By: Abelardo Diesel M.D.   On: 09/01/2017 15:25   Dg Chest 2 View  Result Date: 08/31/2017 CLINICAL DATA:  Fever. EXAM: CHEST - 2 VIEW COMPARISON:  Chest x-ray dated August 14, 2017. FINDINGS: Unchanged right chest wall port catheter with tip at the cavoatrial junction. Stable cardiomegaly. Normal pulmonary vascularity. Unchanged small left pleural effusion. No consolidation or pneumothorax. No acute osseous abnormality. IMPRESSION: 1. Unchanged small left pleural effusion. Electronically Signed   By: Titus Dubin M.D.   On: 08/31/2017 15:39   Dg Abd 1 View  Result Date: 09/01/2017 CLINICAL DATA:  Congestive heart failure, diabetes and vasculitis. EXAM: ABDOMEN - 1 VIEW COMPARISON:  None. FINDINGS: There is no bowel obstruction or free air. Consolidation left lung base is identified. There bilateral pleural effusions. IMPRESSION: No bowel obstruction or free air. Consolidation left lung base identified. Electronically Signed   By: Abelardo Diesel M.D.   On: 09/01/2017 15:26   US Renal  Result Date: 09/27/2017 CLINICAL DATA:  Acute kidney injury. History of diabetes, hypertension. EXAM: RENAL / URINARY TRACT ULTRASOUND COMPLETE COMPARISON:  CT abdomen and pelvis August 31, 2017 FINDINGS: Right Kidney: Length: 12 cm. Increased parenchymal echogenicity. Mild hydronephrosis. No suspicious mass. Left Kidney: Length: 9.8  cm. Increased parenchymal echogenicity. No suspicious mass or hydronephrosis visualized. Bladder: Foley catheter. Anechoic free fluid.  Nodular liver.  Anasarca. IMPRESSION: 1. Echogenic kidneys compatible with medical renal disease. 2. Mild RIGHT hydronephrosis. 3. Cirrhosis and ascites. Electronically Signed   By: Elon Alas M.D.   On: 09/27/2017 15:52   US Paracentesis  Result Date: 09/02/2017 INDICATION: Patient with history of CHF, anasarca, myelodysplastic syndrome, lymphoplasmacytic lymphoma, ascites. Request made for diagnostic and therapeutic paracentesis. EXAM: ULTRASOUND GUIDED DIAGNOSTIC AND THERAPEUTIC PARACENTESIS MEDICATIONS: None COMPLICATIONS: None immediate. PROCEDURE: Informed written consent was obtained from the patient after a discussion of the risks, benefits and alternatives to treatment. A timeout was performed prior to the initiation of the procedure. Initial ultrasound scanning demonstrates a moderate amount of ascites within the left mid to lower abdominal quadrant. The left mid to lower abdomen was prepped and draped in the usual sterile fashion. 1% lidocaine was used for local anesthesia. Following this, a 19 gauge, 7-cm, Yueh catheter was introduced. An ultrasound image was saved for documentation purposes. The paracentesis was performed. The catheter was removed and a dressing was applied. The patient tolerated the procedure well without immediate post procedural complication. FINDINGS: A total of approximately 3.3 liters of hazy, light yellow  fluid was removed. Samples were sent to the laboratory as requested by the clinical team. IMPRESSION: Successful ultrasound-guided diagnostic and therapeutic paracentesis yielding 3.3 liters of peritoneal fluid. Read by: Rowe Robert, PA-C Electronically Signed   By: Lucrezia Europe M.D.   On: 09/02/2017 16:20   Dg Chest Port 1 View  Result Date: 09/27/2017 CLINICAL DATA:  Renal failure.  History of lymphoma. EXAM: PORTABLE CHEST 1  VIEW COMPARISON:  Chest x-ray dated September 01, 2017. FINDINGS: Unchanged right chest wall port catheter. Stable cardiomegaly. Resolved pulmonary edema. Unchanged small left greater than right pleural effusions with left lower lobe atelectasis. New atelectasis in the medial right lung base. No pneumothorax. No acute osseous abnormality. IMPRESSION: 1. Unchanged left greater than right pleural effusions. 2. Persistent left lower lobe consolidation/atelectasis. Increased medial right basilar atelectasis. 3. Resolved pulmonary edema. Electronically Signed   By: Titus Dubin M.D.   On: 09/27/2017 12:26   Ct Renal Stone Study  Result Date: 09/27/2017 CLINICAL DATA:  72 year old female with malignant lymphoma. Difficulty going to the bathroom for the past couple days. Subsequent encounter. EXAM: CT ABDOMEN AND PELVIS WITHOUT CONTRAST TECHNIQUE: Multidetector CT imaging of the abdomen and pelvis was performed following the standard protocol without IV contrast. COMPARISON:  09/27/2017 CT.  08/31/2017 CT.  07/22/2017 PET-CT. FINDINGS: Lower chest: Bibasilar atelectasis versus infiltrates with small pleural effusions greater on the left. Medial right middle lobe atelectasis/scarring. Cardiomegaly. Central line tip caval atrial junction. Coronary artery calcifications. Aortic valve calcifications. Probable anemia given density of cardiac contents. Hepatobiliary: Taking into account limitation by non contrast imaging, no focal hepatic lesion. Radiopaque material and gallbladder may represent sludge. Pancreas: Taking into account limitation by non contrast imaging, no focal pancreatic lesion. Spleen: Taking into account limitation by non contrast imaging, splenomegaly without focal lesion. Adrenals/Urinary Tract: Mild right-sided hydronephrosis. Etiology indeterminate as no obstructing calcified stone is noted. Bladder decompressed by Foley catheter. Taking into account limitation by non contrast imaging, no renal or  adrenal mass. Tiny nonobstructing left renal calculus versus vascular calcification. Stomach/Bowel: Evaluation of bowel limited by ascites and third spacing of fluid. Contrast is seen throughout the colon suggesting no high-grade obstructing lesion. Stomach tenting along the anterior wall which may be related to prior gastrostomy placement. Small fat containing hernia with overlying skin defect inferior to this level unchanged. Vascular/Lymphatic: Atherosclerotic changes aorta and aortic branch vessels. No abdominal aortic aneurysm. Increase number of normal size lymph nodes retroperitoneal and mesenteric region. Reproductive: No adnexal or uterine mass noted. Other: Moderate amount of ascites and third spacing of fluid. No free intraperitoneal air. Musculoskeletal: No acute osseous abnormality. IMPRESSION: Mild right-sided hydronephrosis. Etiology indeterminate as no obstructing calcified stone is noted. Contrast throughout colon without evidence of obstruction. Ascites and third spacing of fluid. Splenomegaly. Bibasilar atelectasis/infiltrates with small pleural effusions. Cardiomegaly. Aortic Atherosclerosis (ICD10-I70.0). Electronically Signed   By: Genia Del M.D.   On: 09/27/2017 17:08   Ct Renal Stone Study  Result Date: 08/31/2017 CLINICAL DATA:  Right-sided flank pain. EXAM: CT ABDOMEN AND PELVIS WITHOUT CONTRAST TECHNIQUE: Multidetector CT imaging of the abdomen and pelvis was performed following the standard protocol without IV contrast. COMPARISON:  07/22/2017 PET-CT FINDINGS: Lower chest: Small left and trace right pleural effusion. Moderate pericardial effusion, stable. Small hiatal hernia. Hepatobiliary: High-density liver without focal abnormality. The caudate is prominent but the liver surface is not definitely nodular. Negative gallbladder and common bile duct. Pancreas: Unremarkable. Spleen: Chronic splenomegaly, recently evaluated by PET. AP span is 19 cm.  Adrenals/Urinary Tract: Negative  adrenals. No hydronephrosis or stone. Unremarkable bladder. Stomach/Bowel:  No obstruction. No inflammatory changes Vascular/Lymphatic: Atherosclerotic calcification. No mass or adenopathy. Reproductive:Negative Other: Moderate ascites that is non loculated, increased. There is marked anasarca, progressed. Small periumbilical hernia containing ascitic fluid. Musculoskeletal: No acute abnormalities. IMPRESSION: 1. Generalized volume overload. Marked anasarca and moderate ascites has progressed from 07/22/2017. Small left effusion and moderate pericardial effusion are similar to prior. 2. Chronic splenomegaly, reference PET CT last month. Electronically Signed   By: Monte Fantasia M.D.   On: 08/31/2017 18:55     CBC Recent Labs  Lab 09/27/17 0807 09/27/17 1300 09/28/17 0529  WBC 1.3* 1.2* 1.1*  HGB 9.5* 8.0* 8.2*  HCT 27.6* 23.2* 24.3*  PLT 118* 122* 123*  MCV 81.4 81.7 82.7  MCH 28.0 28.2 27.9  MCHC 34.4 34.5 33.7  RDW 14.9* 14.9 15.0  LYMPHSABS 0.3* 0.4*  --   MONOABS 0.4 0.4  --   EOSABS 0.0 0.0  --   BASOSABS 0.0 0.0  --     Chemistries  Recent Labs  Lab 09/27/17 0807 09/27/17 1300 09/28/17 0529  NA 130* 131* 133*  K 4.7 4.6 3.9  CL 99 100 103  CO2 17* 19* 18*  GLUCOSE 124* 120* 115*  BUN 67* 69* 60*  CREATININE 6.03* 6.29* 4.81*  CALCIUM 8.2* 7.8* 7.8*  MG 2.3  --   --   AST 13* 13* 11*  ALT 8 10 9   ALKPHOS 89 70 63  BILITOT 0.4 0.6 0.4   ------------------------------------------------------------------------------------------------------------------ estimated creatinine clearance is 11.5 mL/min (A) (by C-G formula based on SCr of 4.81 mg/dL (H)). ------------------------------------------------------------------------------------------------------------------ No results for input(s): HGBA1C in the last 72 hours. ------------------------------------------------------------------------------------------------------------------ No results for input(s): CHOL, HDL,  LDLCALC, TRIG, CHOLHDL, LDLDIRECT in the last 72 hours. ------------------------------------------------------------------------------------------------------------------ No results for input(s): TSH, T4TOTAL, T3FREE, THYROIDAB in the last 72 hours.  Invalid input(s): FREET3 ------------------------------------------------------------------------------------------------------------------ No results for input(s): VITAMINB12, FOLATE, FERRITIN, TIBC, IRON, RETICCTPCT in the last 72 hours.  Coagulation profile Recent Labs  Lab 09/27/17 1300  INR 1.19    No results for input(s): DDIMER in the last 72 hours.  Cardiac Enzymes No results for input(s): CKMB, TROPONINI, MYOGLOBIN in the last 168 hours.  Invalid input(s): CK ------------------------------------------------------------------------------------------------------------------ Invalid input(s): POCBNP   CBG: No results for input(s): GLUCAP in the last 168 hours.     Studies: US Renal  Result Date: 09/27/2017 CLINICAL DATA:  Acute kidney injury. History of diabetes, hypertension. EXAM: RENAL / URINARY TRACT ULTRASOUND COMPLETE COMPARISON:  CT abdomen and pelvis August 31, 2017 FINDINGS: Right Kidney: Length: 12 cm. Increased parenchymal echogenicity. Mild hydronephrosis. No suspicious mass. Left Kidney: Length: 9.8 cm. Increased parenchymal echogenicity. No suspicious mass or hydronephrosis visualized. Bladder: Foley catheter. Anechoic free fluid.  Nodular liver.  Anasarca. IMPRESSION: 1. Echogenic kidneys compatible with medical renal disease. 2. Mild RIGHT hydronephrosis. 3. Cirrhosis and ascites. Electronically Signed   By: Elon Alas M.D.   On: 09/27/2017 15:52   Dg Chest Port 1 View  Result Date: 09/27/2017 CLINICAL DATA:  Renal failure.  History of lymphoma. EXAM: PORTABLE CHEST 1 VIEW COMPARISON:  Chest x-ray dated September 01, 2017. FINDINGS: Unchanged right chest wall port catheter. Stable cardiomegaly. Resolved  pulmonary edema. Unchanged small left greater than right pleural effusions with left lower lobe atelectasis. New atelectasis in the medial right lung base. No pneumothorax. No acute osseous abnormality. IMPRESSION: 1. Unchanged left greater than right pleural effusions. 2. Persistent left lower lobe  consolidation/atelectasis. Increased medial right basilar atelectasis. 3. Resolved pulmonary edema. Electronically Signed   By: Titus Dubin M.D.   On: 09/27/2017 12:26   Ct Renal Stone Study  Result Date: 09/27/2017 CLINICAL DATA:  73 year old female with malignant lymphoma. Difficulty going to the bathroom for the past couple days. Subsequent encounter. EXAM: CT ABDOMEN AND PELVIS WITHOUT CONTRAST TECHNIQUE: Multidetector CT imaging of the abdomen and pelvis was performed following the standard protocol without IV contrast. COMPARISON:  09/27/2017 CT.  08/31/2017 CT.  07/22/2017 PET-CT. FINDINGS: Lower chest: Bibasilar atelectasis versus infiltrates with small pleural effusions greater on the left. Medial right middle lobe atelectasis/scarring. Cardiomegaly. Central line tip caval atrial junction. Coronary artery calcifications. Aortic valve calcifications. Probable anemia given density of cardiac contents. Hepatobiliary: Taking into account limitation by non contrast imaging, no focal hepatic lesion. Radiopaque material and gallbladder may represent sludge. Pancreas: Taking into account limitation by non contrast imaging, no focal pancreatic lesion. Spleen: Taking into account limitation by non contrast imaging, splenomegaly without focal lesion. Adrenals/Urinary Tract: Mild right-sided hydronephrosis. Etiology indeterminate as no obstructing calcified stone is noted. Bladder decompressed by Foley catheter. Taking into account limitation by non contrast imaging, no renal or adrenal mass. Tiny nonobstructing left renal calculus versus vascular calcification. Stomach/Bowel: Evaluation of bowel limited by ascites  and third spacing of fluid. Contrast is seen throughout the colon suggesting no high-grade obstructing lesion. Stomach tenting along the anterior wall which may be related to prior gastrostomy placement. Small fat containing hernia with overlying skin defect inferior to this level unchanged. Vascular/Lymphatic: Atherosclerotic changes aorta and aortic branch vessels. No abdominal aortic aneurysm. Increase number of normal size lymph nodes retroperitoneal and mesenteric region. Reproductive: No adnexal or uterine mass noted. Other: Moderate amount of ascites and third spacing of fluid. No free intraperitoneal air. Musculoskeletal: No acute osseous abnormality. IMPRESSION: Mild right-sided hydronephrosis. Etiology indeterminate as no obstructing calcified stone is noted. Contrast throughout colon without evidence of obstruction. Ascites and third spacing of fluid. Splenomegaly. Bibasilar atelectasis/infiltrates with small pleural effusions. Cardiomegaly. Aortic Atherosclerosis (ICD10-I70.0). Electronically Signed   By: Genia Del M.D.   On: 09/27/2017 17:08      Lab Results  Component Value Date   HGBA1C 6.2 (H) 09/05/2017   HGBA1C 6.1 (H) 09/02/2017   HGBA1C 4.5 (L) 07/08/2015   Lab Results  Component Value Date   LDLCALC 94 06/19/2015   CREATININE 4.81 (H) 09/28/2017       Scheduled Meds: . amLODipine  10 mg Oral Daily  . chlorhexidine  15 mL Mouth/Throat BID  . cholecalciferol  400 Units Oral Daily  . diltiazem  120 mg Oral Q8H  . famotidine  20 mg Oral QHS  . feeding supplement (GLUCERNA SHAKE)  237 mL Oral TID BM  . ferrous sulfate  325 mg Oral BID PC  . fluticasone  1 spray Each Nare Daily  . hydrALAZINE  100 mg Oral TID  . ipratropium-albuterol  3 mL Nebulization BID  . levothyroxine  224 mcg Oral QAC breakfast  . multivitamin with minerals  1 tablet Oral Daily  . vitamin B-12  500 mcg Oral Daily   Continuous Infusions: . sodium chloride    . sodium chloride 100 mL/hr at  09/28/17 0510  . piperacillin-tazobactam (ZOSYN)  IV 2.25 g (09/28/17 0940)     LOS: 1 day    Time spent: >30 MINS    Reyne Dumas  [facility] Pager [phone]. If 7PM-7AM, please contact night-coverage at [email or web address], password [facility] 09/28/2017, 10:40  AM  LOS: 1 day

## 2017-09-29 LAB — BASIC METABOLIC PANEL
Anion gap: 9 (ref 5–15)
BUN: 54 mg/dL — AB (ref 8–23)
CO2: 19 mmol/L — ABNORMAL LOW (ref 22–32)
Calcium: 7.6 mg/dL — ABNORMAL LOW (ref 8.9–10.3)
Chloride: 105 mmol/L (ref 98–111)
Creatinine, Ser: 3.68 mg/dL — ABNORMAL HIGH (ref 0.44–1.00)
GFR calc Af Amer: 13 mL/min — ABNORMAL LOW (ref >60)
GFR, EST NON AFRICAN AMERICAN: 12 mL/min — AB (ref >60)
GLUCOSE: 125 mg/dL — AB (ref 70–99)
POTASSIUM: 3.6 mmol/L (ref 3.5–5.1)
Sodium: 133 mmol/L — ABNORMAL LOW (ref 135–145)

## 2017-09-29 LAB — CBC WITH DIFFERENTIAL/PLATELET
BASOS PCT: 1 %
Basophils Absolute: 0 10*3/uL (ref 0.0–0.1)
Basophils Absolute: 0 10*3/uL (ref 0.0–0.1)
Basophils Relative: 1 %
EOS ABS: 0.2 10*3/uL (ref 0.0–0.7)
EOS PCT: 13 %
EOS PCT: 12 %
Eosinophils Absolute: 0.2 10*3/uL (ref 0.0–0.7)
HCT: 24.3 % — ABNORMAL LOW (ref 36.0–46.0)
HEMATOCRIT: 25.6 % — AB (ref 36.0–46.0)
HEMOGLOBIN: 8.2 g/dL — AB (ref 12.0–15.0)
HEMOGLOBIN: 8.8 g/dL — AB (ref 12.0–15.0)
LYMPHS PCT: 24 %
Lymphocytes Relative: 19 %
Lymphs Abs: 0.4 10*3/uL — ABNORMAL LOW (ref 0.7–4.0)
Lymphs Abs: 0.3 10*3/uL — ABNORMAL LOW (ref 0.7–4.0)
MCH: 27.7 pg (ref 26.0–34.0)
MCH: 28.2 pg (ref 26.0–34.0)
MCHC: 33.7 g/dL (ref 30.0–36.0)
MCHC: 34.4 g/dL (ref 30.0–36.0)
MCV: 82.1 fL (ref 78.0–100.0)
MCV: 82.1 fL (ref 78.0–100.0)
MONO ABS: 0.4 10*3/uL (ref 0.1–1.0)
Monocytes Absolute: 0.4 10*3/uL (ref 0.1–1.0)
Monocytes Relative: 23 %
Monocytes Relative: 22 %
NEUTROS ABS: 0.8 10*3/uL — AB (ref 1.7–7.7)
NEUTROS PCT: 39 %
Neutro Abs: 0.6 10*3/uL — ABNORMAL LOW (ref 1.7–7.7)
Neutrophils Relative %: 46 %
PLATELETS: 147 10*3/uL — AB (ref 150–400)
Platelets: 155 10*3/uL (ref 150–400)
RBC: 2.96 MIL/uL — ABNORMAL LOW (ref 3.87–5.11)
RBC: 3.12 MIL/uL — ABNORMAL LOW (ref 3.87–5.11)
RDW: 14.9 % (ref 11.5–15.5)
RDW: 14.9 % (ref 11.5–15.5)
WBC: 1.6 10*3/uL — AB (ref 4.0–10.5)
WBC: 1.7 10*3/uL — ABNORMAL LOW (ref 4.0–10.5)

## 2017-09-29 LAB — URIC ACID: Uric Acid, Serum: 7.3 mg/dL — ABNORMAL HIGH (ref 2.5–7.1)

## 2017-09-29 LAB — URINE CULTURE
CULTURE: NO GROWTH
Special Requests: NORMAL

## 2017-09-29 MED ORDER — LACTULOSE 10 GM/15ML PO SOLN
20.0000 g | Freq: Two times a day (BID) | ORAL | Status: DC
  Administered 2017-09-29 – 2017-10-05 (×10): 20 g via ORAL
  Filled 2017-09-29 (×12): qty 30

## 2017-09-29 MED ORDER — ALBUMIN HUMAN 25 % IV SOLN
12.5000 g | Freq: Four times a day (QID) | INTRAVENOUS | Status: AC
  Administered 2017-09-29 – 2017-09-30 (×4): 12.5 g via INTRAVENOUS
  Filled 2017-09-29 (×4): qty 50

## 2017-09-29 MED ORDER — BISACODYL 10 MG RE SUPP
10.0000 mg | Freq: Once | RECTAL | Status: AC
  Administered 2017-09-29: 10 mg via RECTAL
  Filled 2017-09-29: qty 1

## 2017-09-29 NOTE — Clinical Social Work Note (Signed)
Patient recently assessed on 09/06/2017, no psychosocial changes reported see assessment below. CSW consulted for facility placement, patient admitted from [facility] and wants to discharge to [facility]. Patient currently oriented to person and place, unable to participate in discharge planning. CSW contacted patient's daughter Emily Kennedy) and discussed patient's discharge planning. Patient's daughter confirmed plan for patient to discharge to [facility] SNF to complete rehab and eventually transition [facility]. Patient's daughter reported that she is actively working to get patient Emily Kennedy term care Medicaid. CSW will complete FL2 and follow up with [facility] SNF to see if they are able to offer patient a bed.  Clinical Social Work Assessment  Patient Details  Name: Emily Kennedy MRN: [id number] Date of Birth: Apr 30, 1945  Date of referral:    09/29/2017              Reason for consult:     Facility Placement              Permission sought to share information with:    Permission granted to share information::     Name::        Agency::     Relationship::     Contact Information:     Housing/Transportation Living arrangements for the past 2 months:   Single Family Home, SNF(Patient admitted to [facility] 6/25) Source of Information:   Adult Children Patient Interpreter Needed:   None Criminal Activity/Legal Involvement Pertinent to Current Situation/Hospitalization:   N/A Significant Relationships:   Adult Children Lives with:   Facility Resident Do you feel safe going back to the place where you live?    Need for family participation in patient care:   Yes  Care giving concerns:  Patient from home alone. Patient's daughter reported that she stays with patient often. Patient reported that she is independent with all ADLs at baseline and has a walker and wheelchair at home as needed. PT recommending SNF;24 hour supervision.   Social Worker assessment / plan:  CSW  spoke with patient at bedside regarding PT recommendation for SNF; 24 hour supervision. CSW explained SNF placement process, patient verbalized understanding and reported that she has been to SNF a few years ago. Patient reported that she wants her daughter involved and that her daughter may want patient to go to a SNF close to patient's daughter's home. CSW agreed to follow up with patient's daughter, patient granted CSW verbal permission to speak with her daughter.   CSW contacted patient's daughter Emily Kennedy) and discussed patient's discharge planning. Patient's daughter reported that she is agreeable to SNF for ST rehab for patient. Patient's daughter reported that their first choice is [facility] SNF and the second choice is [facility] because it is close to patient's daughter's home. CSW agreed to complete patient's FL2 and follow up with preferred SNFs.   CSW completed patient's FL2 and will follow up with preferred SNFs.  CSW will continue to follow and assist with discharge planning.  Employment status:   Retired Forensic scientist:   Medicare PT Recommendations:   SNF Information / Referral to community resources:   Other(Patient was admitted from SNF with plan to return)  Patient/Family's Response to care:  Patient/patient's daughter appreciative of CSW assistance with discharge planning.   Patient/Family's Understanding of and Emotional Response to Diagnosis, Current Treatment, and Prognosis:  Patient presented calm and verbalized understanding of current treatment plan. Patient's daughter involved in patient's care and verbalized agreement  with plan for patient to dc to SNF for ST rehab.   Emotional Assessment Appearance:    Attitude/Demeanor/Rapport:   UTA Affect (typically observed):   UTA Orientation:    Oriented to person, Oriented to Place Alcohol / Substance use:   N/A Psych involvement (Current and /or in the community):   No  Discharge  Needs  Concerns to be addressed:   Care Coordination Readmission within the last 30 days:   Yes Current discharge risk:   Physical Impairment Barriers to Discharge:   Continued Medical workup   Burnis Medin, LCSW 09/29/2017, 11:33 AM

## 2017-09-29 NOTE — NC FL2 (Signed)
[place] LEVEL OF CARE SCREENING TOOL     IDENTIFICATION  Patient Name: Emily Kennedy Birthdate: [birth year] Sex: female Admission Date (Current Location): 09/27/2017  [facility] and [place] Number:  Herbalist and Address:  [facility],  [place], [place]      Provider Number: [id number]  Attending Physician Name and Address:  Reyne Dumas, MD  Relative Name and Phone Number:  Cletus Gash [phone]    Current Level of Care: Hospital Recommended Level of Care: [place] Prior Approval Number:    Date Approved/Denied:   PASRR Number: [id number]  Discharge Plan: SNF    Current Diagnoses: Patient Active Problem List   Diagnosis Date Noted  . ARF (acute renal failure) ([place]) 09/27/2017  . Pancytopenia ([place]) 09/27/2017  . Acute lower UTI 09/27/2017  . Abdominal distension   . Anasarca 09/01/2017  . Thrombocytopenia ([place]) 09/01/2017  . Malignant lymphoma ([place]) 09/01/2017  . Fever 09/01/2017  . UTI (urinary tract infection) 08/31/2017  . 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 MDS (myelodysplastic syndrome) ([place]) 07/14/2017  . Solitary pulmonary nodule 02/15/2016  . CHF (congestive heart failure) ([place]) 02/14/2016  . Malnutrition of moderate degree 09/18/2015  . Benign essential HTN   . Vasculitis ([place])   . Rheumatoid arthritis ([place])   . History of recent hospitalization   . Acute blood loss anemia   . Anemia of chronic disease   . Hyponatremia 09/15/2015  . HCAP (healthcare-associated pneumonia) 09/14/2015  . Respiratory arrest ([place]) intubated 4/13 post arrest 06/27/2015  . Cardiac arrest ([place]) 4/13 suspected mucus plugging as cause of arrest 06/27/2015  . Controlled diabetes mellitus type 2 with complications ([place])   . Dysphagia   . Atrial fibrillation with RVR ([place]) 06/18/2015  . Chronic diastolic  CHF (congestive heart failure) ([place])   . Acute encephalopathy 06/14/2015  . Anemia, chronic disease 05/18/2015  . Hypothyroidism 05/18/2015  . Essential hypertension 05/18/2015  . Diabetes mellitus type 2, uncontrolled ([place]) 04/09/2015  . 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    Orientation RESPIRATION BLADDER Height & Weight     Self, Place  O2 Indwelling catheter Weight: 187 lb 6.3 oz (85 kg) Height:  5\' 4"  (162.6 cm)  BEHAVIORAL SYMPTOMS/MOOD NEUROLOGICAL BOWEL NUTRITION STATUS      Incontinent Diet(Heart)  AMBULATORY STATUS COMMUNICATION OF NEEDS Skin   Total Care Verbally Other (Comment)(Blister on right buttocks, foam dressing; ecchymosis on left buttocks, foam dressing; MASD on right/left buttocks and perineum, barrier cream; Rash on right/left buttocks, foam dressing)                       Personal Care Assistance Level of Assistance  Bathing, Feeding, Dressing Bathing Assistance: Maximum assistance Feeding assistance: Limited assistance Dressing Assistance: Maximum assistance     Functional Limitations Info  Sight, Hearing, Speech Sight Info: Adequate Hearing Info: Adequate Speech Info: Adequate    SPECIAL CARE FACTORS FREQUENCY  PT (By licensed PT), OT (By licensed OT)     PT Frequency: 5x/week OT Frequency: 5x/week            Contractures      Additional Factors Info  Code Status, Allergies Code Status Info: DNR Allergies Info: Benadryl Diphenhydramine; Ciprofloxacin; Furosemide; Nitrofurantoin Monohyd Macro; Septra Bactrim; Sulfa Drugs Cross Reactors; Allopurinol; Ceclor Cefaclor           Current Medications (09/29/2017):  This is the current hospital active medication list Current Facility-Administered Medications  Medication Dose Route Frequency Provider Last Rate Last Dose  . acetaminophen (TYLENOL) tablet 650 mg  650 mg Oral Q6H PRN [name], MD       Or  . acetaminophen (TYLENOL) suppository 650 mg  650 mg  Rectal Q6H PRN [name], MD      . albumin human 25 % solution 12.5 g  12.5 g Intravenous Q6H Abrol, Ascencion Dike, MD 60 mL/hr at 09/29/17 1057 12.5 g at 09/29/17 1057  . albuterol (PROVENTIL) (2.5 MG/3ML) 0.083% nebulizer solution 2.5 mg  2.5 mg Nebulization Q4H PRN [name], MD      . amLODipine (NORVASC) tablet 10 mg  10 mg Oral Daily [name], MD   10 mg at 09/29/17 1059  . bisacodyl (DULCOLAX) suppository 10 mg  10 mg Rectal Once Reyne Dumas, MD      . chlorhexidine (PERIDEX) 0.12 % solution 15 mL  15 mL Mouth/Throat BID [name], MD   15 mL at 09/29/17 1058  . cholecalciferol (VITAMIN D) tablet 400 Units  400 Units Oral Daily [name], MD   400 Units at 09/29/17 1058  . diltiazem (CARDIZEM) tablet 120 mg  120 mg Oral Q8H [name], MD   120 mg at 09/29/17 0539  . famotidine (PEPCID) tablet 20 mg  20 mg Oral QHS [name], MD   20 mg at 09/28/17 2122  . feeding supplement (GLUCERNA SHAKE) (GLUCERNA SHAKE) liquid 237 mL  237 mL Oral TID BM [name], MD   237 mL at 09/29/17 1059  . ferrous sulfate tablet 325 mg  325 mg Oral BID Forrestine Him, MD   325 mg at 09/29/17 1058  . fluticasone (FLONASE) 50 MCG/ACT nasal spray 1 spray  1 spray Each Nare Daily [name], MD   1 spray at 09/29/17 1059  . heparin injection 5,000 Units  5,000 Units Subcutaneous Q8H Pham, Anh P, RPH   5,000 Units at 09/29/17 0540  . hydrALAZINE (APRESOLINE) tablet 100 mg  100 mg Oral TID [name], MD   100 mg at 09/29/17 1058  . ipratropium-albuterol (DUONEB) 0.5-2.5 (3) MG/3ML nebulizer solution 3 mL  3 mL Nebulization Q6H PRN Abrol, Ascencion Dike, MD      . lactulose (CHRONULAC) 10 GM/15ML solution 20 g  20 g Oral BID Reyne Dumas, MD   20 g at 09/29/17 1058  . levothyroxine (SYNTHROID, LEVOTHROID) tablet 224 mcg  224 mcg Oral QAC breakfast [name], MD   224 mcg at 09/29/17 1058  . multivitamin with minerals tablet 1 tablet  1 tablet Oral Daily [name], MD   1 tablet at 09/29/17 1058  . ondansetron (ZOFRAN)  tablet 4 mg  4 mg Oral Q6H PRN Reyne Dumas, MD      . oxyCODONE (Oxy IR/ROXICODONE) immediate release tablet 10 mg  10 mg Oral Q6H PRN [name], MD   10 mg at 09/28/17 0524  . piperacillin-tazobactam (ZOSYN) IVPB 2.25 g  2.25 g Intravenous Q8H Polly Cobia, RPH   Stopped at 09/29/17 1129  . senna (SENOKOT) tablet 17.2 mg  2 tablet Oral BID PRN [name], MD      . vitamin B-12 (CYANOCOBALAMIN) tablet 500 mcg  500 mcg Oral Daily [name], MD   500 mcg at 09/29/17 1058     Discharge Medications: Please see discharge summary for a list of discharge medications.  Relevant Imaging Results:  Relevant Lab Results:  Additional Information SS# [id number]  Burnis Medin, LCSW

## 2017-09-29 NOTE — Progress Notes (Signed)
[facility] PROGRESS NOTE  Emily Kennedy DSK:[id number] DOB: 1946-02-20 DOA: 09/27/2017   PCP: [name], MD     Assessment/Plan: Principal Problem:   ARF (acute renal failure) ([place]) Active Problems:   Pancytopenia ([place])   Acute lower UTI    72 y.o.femalewith medical history significant ofHTN, HLD,CHF, DM type II, vasculitis, and malignant lymphoma currently receiving chemotherapy followed by [name] of oncology who presents with not feeling well.  Slight nausea.  Denies fever, chills, flank pain, diarrhea, brbpr, dysuria, hematuria.  Pt was brought to ED  Because of a creatinine of 6.29.urinary retention, constipation for 5 days.  Assessment and plan  ARF, improving Patient referred from oncology for acute renal failure Baseline creatinine 1.6, presented with a creatinine of 6.29, now 3.68 Bladder scan showed about 1 L of urine was in urinary retention Foley catheter placed Patient also found to have a urinary tract infection CT stone protocol shows mild right-sided hydronephrosis Hold Demadex Status with IV fluids/albumin given extremely low albumin of 1.6 Check cmp in am Uric acid 7.3 , may benefit from urate lowering therapy  Constipation/urinary retention  patient had not had a bowel movement in 5 days Went into urinary retention at the same time  MRI of the lumbar spine severely limited study due to extensive ascites. No evidence of cord compression. No evidence of spinal stenosis within the lumbar spine.  Patient states she is bedbound at baseline, will need SNF Continue Foley, constipation protocol Dulcolax suppository      UTI, currently on Zosyn Follow urine culture Blood culture x2 Urine culture    Pancytopenia White count 1.6 , hemoglobin and platelets are stable  Hypertension Cont amlodipine 10mg  po qday Cont cardizem 120mg  po q8h Cont hydralazine 100mg  po tid  Hypothyroidism Cont levothyroxine 224 micrograms po  qday  Gerd Cont H2 Blocker    DVT prophylaxsis  Heparin [platelets stable]  Code Status:  DO NOT RESUSCITATE   Family Communication: Discussed in detail with the patient, all imaging results, lab results explained to the patient   Disposition Plan:  Several days pending improvement, SNF when ready      Consultants:  none  Procedures:  none  Antibiotics: Anti-infectives (From admission, onward)   Start     Dose/Rate Route Frequency Ordered Stop   09/28/17 0200  piperacillin-tazobactam (ZOSYN) IVPB 2.25 g     2.25 g 100 mL/hr over 30 Minutes Intravenous Every 8 hours 09/27/17 2136     09/27/17 1815  piperacillin-tazobactam (ZOSYN) IVPB 2.25 g     2.25 g 100 mL/hr over 30 Minutes Intravenous  Once 09/27/17 1804 09/27/17 1859         HPI/Subjective:  has not yet had a bowel movement, complains of being constipated  Objective: Vitals:   09/28/17 2016 09/29/17 0539 09/29/17 0539 09/29/17 0542  BP: 134/60 (!) 147/63 (!) 147/63 (!) 154/73  Pulse: 85  87 (!) 57  Resp: 18  18 18   Temp: 98.4 F (36.9 C)  98.4 F (36.9 C) 97.8 F (36.6 C)  TempSrc: Oral  Oral Oral  SpO2: 97%  95% 97%  Weight:      Height:        Intake/Output Summary (Last 24 hours) at 09/29/2017 1211 Last data filed at 09/29/2017 0550 Gross per 24 hour  Intake 1288.33 ml  Output 800 ml  Net 488.33 ml    Exam:  Examination:  General exam:  Chronically ill appearing Respiratory system: Clear  to auscultation. Respiratory effort normal. Cardiovascular system: S1 & S2 heard, RRR. No JVD, murmurs, rubs, gallops or clicks. No pedal edema. Gastrointestinal system: Abdomen  Generalized distention, soft and nontender. No organomegaly or masses felt. Normal bowel sounds heard. Central nervous system: Alert and oriented. No focal neurological deficits. Extremities:  Strength 2/5 in bilateral lower extremities, 2+ pitting edema Skin: No rashes, lesions or ulcers Psychiatry: Judgement and insight  appear normal. Mood & affect appropriate.     Data Reviewed: I have personally reviewed following labs and imaging studies  Micro Results Recent Results (from the past 240 hour(s))  Urine culture     Status: None   Collection Time: 09/27/17  2:03 PM  Result Value Ref Range Status   Specimen Description   Final    URINE, CLEAN CATCH Performed at [facility] [address], [place], [place] [address]    Special Requests   Final    Normal Performed at [facility], [place] [address], [place], [place] [address]    Culture   Final    NO GROWTH Performed at [facility], [place] [address], [place],  [address]    Report Status 09/29/2017 FINAL  Final    Radiology Reports Dg Chest 1 View  Result Date: 09/01/2017 CLINICAL DATA:  Congestive heart failure EXAM: CHEST  1 VIEW COMPARISON:  August 31, 2017 FINDINGS: The heart size and mediastinal contours are stable. Right central venous line is identified unchanged. Heart size is enlarged. There is pulmonary edema. Consolidation of left lung base with left pleural effusion is identified. Minimal right pleural effusion is noted. The visualized skeletal structures are stable. IMPRESSION: Congestive heart failure. Bilateral pleural effusions. Consolidation of left lung base underlying pneumonia is not excluded. Electronically Signed   By: Abelardo Diesel M.D.   On: 09/01/2017 15:25   Dg Chest 2 View  Result Date: 08/31/2017 CLINICAL DATA:  Fever. EXAM: CHEST - 2 VIEW COMPARISON:  Chest x-ray dated August 14, 2017. FINDINGS: Unchanged right chest wall port catheter with tip at the cavoatrial junction. Stable cardiomegaly. Normal pulmonary vascularity. Unchanged small left pleural effusion. No consolidation or pneumothorax. No acute osseous abnormality. IMPRESSION: 1. Unchanged small left pleural effusion. Electronically Signed   By: Titus Dubin M.D.   On: 08/31/2017 15:39   Dg Abd 1 View  Result Date:  09/01/2017 CLINICAL DATA:  Congestive heart failure, diabetes and vasculitis. EXAM: ABDOMEN - 1 VIEW COMPARISON:  None. FINDINGS: There is no bowel obstruction or free air. Consolidation left lung base is identified. There bilateral pleural effusions. IMPRESSION: No bowel obstruction or free air. Consolidation left lung base identified. Electronically Signed   By: Abelardo Diesel M.D.   On: 09/01/2017 15:26   Mr Lumbar Spine Wo Contrast  Result Date: 09/28/2017 CLINICAL DATA:  Initial evaluation for recent increase in lower back pain. Urinary retention with constipation. History of lymphoma, CHF, renal failure with ascites. EXAM: MRI LUMBAR SPINE WITHOUT CONTRAST MRI SACRUM WITHOUT CONTRAST TECHNIQUE: Multiplanar, multisequence MR imaging of the lumbar spine and sacrum was performed. No intravenous contrast was administered. COMPARISON:  Prior CT from 09/27/2017 FINDINGS: MRI LUMBAR SPINE: Segmentation: Examination severely limited due to poor signal strain due to patient's extensive ascites. Multiple sequences are fairly limited and essentially nondiagnostic in nature, limiting evaluation of the spine. Normal segmentation. Lowest well-formed disc labeled the L5-S1 level. Alignment: Vertebral bodies normally aligned with preservation of the normal lumbar lordosis. No listhesis or subluxation. Vertebrae: Vertebral body  heights are grossly maintained without appreciable acute or chronic fracture. Mild height loss at the superior endplate of L3 favored to be degenerative in nature. Bone marrow signal intensity poorly evaluated on this exam due to technical limitations. No obvious discrete or focal osseous lesion. Conus medullaris and cauda equina: Conus extends to approximately the T12-L1 level. No evidence for cord compression. Cauda equina in conus medullaris appear grossly within normal limits. Paraspinal and other soft tissues: Paraspinous soft tissues demonstrate no obvious acute abnormality. Diffuse anasarca  noted. Intra-abdominal ascites better evaluated on prior CT. Partially visualized visceral structures not well assessed on this examination. Disc levels: T12-L1: Mild diffuse disc bulge with disc desiccation. Superimposed left paracentral disc protrusion mildly indents the left ventral thecal sac (series 4, image 7). No significant stenosis. Foramina remain patent. L1-2:  Unremarkable. L2-3: Suspected mild disc desiccation without significant disc bulge. No appreciable stenosis. L3-4:  Grossly unremarkable.  No appreciable stenosis. L4-5: Mild disc bulge with disc desiccation. Suspected mild facet hypertrophy. Resultant mild canal with bilateral lateral recess narrowing. Probable mild bilateral foraminal stenosis. L5-S1:  Mild facet hypertrophy.  No appreciable stenosis. MRI SACRUM: Examination severely limited due to extensive ascites and resultant poor signal strength. Multiple sequences are nearly nondiagnostic. Sacrum grossly intact without obvious acute abnormality. SI joints approximated and symmetric. Visualized bones of the pelvis demonstrate no obvious abnormality. Bone marrow signal intensity poorly evaluated on this exam due to technical limitations. No obvious discrete osseous lesion. Extensive ascites noted within the visualized abdomen and pelvis. Large volume retained stool within the distal colon and rectal vault, suggesting constipation. Remainder of the pelvis poorly evaluated on this exam. Diffuse anasarca noted.  No other obvious soft tissue abnormality. IMPRESSION: MRI LUMBAR SPINE IMPRESSION 1. Severely limited study due to extensive ascites and resultant poor signal strength. 2. No evidence for cord compression or significant spinal stenosis within the lumbar spine. No obvious acute abnormality. 3. Left paracentral disc protrusion at T12-L1 without significant stenosis. 4. Mild disc bulge and facet hypertrophy at L4-5 with resultant mild spinal stenosis. 5. Large volume ascites, better  evaluated on recent CT. MRI SACRUM IMPRESSION 1. Severely limited study due to extensive ascites and resultant poor signal strength. Multiple sequences are essentially nondiagnostic. 2. No obvious acute abnormality about the sacrum. 3. Large volume retained stool within the distal colon and rectal vault, suggesting constipation. 4. Large volume ascites, better evaluated on recent CT. Electronically Signed   By: Jeannine Boga M.D.   On: 09/28/2017 19:32   US Renal  Result Date: 09/27/2017 CLINICAL DATA:  Acute kidney injury. History of diabetes, hypertension. EXAM: RENAL / URINARY TRACT ULTRASOUND COMPLETE COMPARISON:  CT abdomen and pelvis August 31, 2017 FINDINGS: Right Kidney: Length: 12 cm. Increased parenchymal echogenicity. Mild hydronephrosis. No suspicious mass. Left Kidney: Length: 9.8 cm. Increased parenchymal echogenicity. No suspicious mass or hydronephrosis visualized. Bladder: Foley catheter. Anechoic free fluid.  Nodular liver.  Anasarca. IMPRESSION: 1. Echogenic kidneys compatible with medical renal disease. 2. Mild RIGHT hydronephrosis. 3. Cirrhosis and ascites. Electronically Signed   By: Elon Alas M.D.   On: 09/27/2017 15:52   Mr Sacrum Si Joints Wo Contrast  Result Date: 09/28/2017 CLINICAL DATA:  Initial evaluation for recent increase in lower back pain. Urinary retention with constipation. History of lymphoma, CHF, renal failure with ascites. EXAM: MRI LUMBAR SPINE WITHOUT CONTRAST MRI SACRUM WITHOUT CONTRAST TECHNIQUE: Multiplanar, multisequence MR imaging of the lumbar spine and sacrum was performed. No intravenous contrast was administered. COMPARISON:  Prior  CT from 09/27/2017 FINDINGS: MRI LUMBAR SPINE: Segmentation: Examination severely limited due to poor signal strain due to patient's extensive ascites. Multiple sequences are fairly limited and essentially nondiagnostic in nature, limiting evaluation of the spine. Normal segmentation. Lowest well-formed disc labeled  the L5-S1 level. Alignment: Vertebral bodies normally aligned with preservation of the normal lumbar lordosis. No listhesis or subluxation. Vertebrae: Vertebral body heights are grossly maintained without appreciable acute or chronic fracture. Mild height loss at the superior endplate of L3 favored to be degenerative in nature. Bone marrow signal intensity poorly evaluated on this exam due to technical limitations. No obvious discrete or focal osseous lesion. Conus medullaris and cauda equina: Conus extends to approximately the T12-L1 level. No evidence for cord compression. Cauda equina in conus medullaris appear grossly within normal limits. Paraspinal and other soft tissues: Paraspinous soft tissues demonstrate no obvious acute abnormality. Diffuse anasarca noted. Intra-abdominal ascites better evaluated on prior CT. Partially visualized visceral structures not well assessed on this examination. Disc levels: T12-L1: Mild diffuse disc bulge with disc desiccation. Superimposed left paracentral disc protrusion mildly indents the left ventral thecal sac (series 4, image 7). No significant stenosis. Foramina remain patent. L1-2:  Unremarkable. L2-3: Suspected mild disc desiccation without significant disc bulge. No appreciable stenosis. L3-4:  Grossly unremarkable.  No appreciable stenosis. L4-5: Mild disc bulge with disc desiccation. Suspected mild facet hypertrophy. Resultant mild canal with bilateral lateral recess narrowing. Probable mild bilateral foraminal stenosis. L5-S1:  Mild facet hypertrophy.  No appreciable stenosis. MRI SACRUM: Examination severely limited due to extensive ascites and resultant poor signal strength. Multiple sequences are nearly nondiagnostic. Sacrum grossly intact without obvious acute abnormality. SI joints approximated and symmetric. Visualized bones of the pelvis demonstrate no obvious abnormality. Bone marrow signal intensity poorly evaluated on this exam due to technical  limitations. No obvious discrete osseous lesion. Extensive ascites noted within the visualized abdomen and pelvis. Large volume retained stool within the distal colon and rectal vault, suggesting constipation. Remainder of the pelvis poorly evaluated on this exam. Diffuse anasarca noted.  No other obvious soft tissue abnormality. IMPRESSION: MRI LUMBAR SPINE IMPRESSION 1. Severely limited study due to extensive ascites and resultant poor signal strength. 2. No evidence for cord compression or significant spinal stenosis within the lumbar spine. No obvious acute abnormality. 3. Left paracentral disc protrusion at T12-L1 without significant stenosis. 4. Mild disc bulge and facet hypertrophy at L4-5 with resultant mild spinal stenosis. 5. Large volume ascites, better evaluated on recent CT. MRI SACRUM IMPRESSION 1. Severely limited study due to extensive ascites and resultant poor signal strength. Multiple sequences are essentially nondiagnostic. 2. No obvious acute abnormality about the sacrum. 3. Large volume retained stool within the distal colon and rectal vault, suggesting constipation. 4. Large volume ascites, better evaluated on recent CT. Electronically Signed   By: Jeannine Boga M.D.   On: 09/28/2017 19:32   US Paracentesis  Result Date: 09/02/2017 INDICATION: Patient with history of CHF, anasarca, myelodysplastic syndrome, lymphoplasmacytic lymphoma, ascites. Request made for diagnostic and therapeutic paracentesis. EXAM: ULTRASOUND GUIDED DIAGNOSTIC AND THERAPEUTIC PARACENTESIS MEDICATIONS: None COMPLICATIONS: None immediate. PROCEDURE: Informed written consent was obtained from the patient after a discussion of the risks, benefits and alternatives to treatment. A timeout was performed prior to the initiation of the procedure. Initial ultrasound scanning demonstrates a moderate amount of ascites within the left mid to lower abdominal quadrant. The left mid to lower abdomen was prepped and draped  in the usual sterile fashion. 1% lidocaine was used  for local anesthesia. Following this, a 19 gauge, 7-cm, Yueh catheter was introduced. An ultrasound image was saved for documentation purposes. The paracentesis was performed. The catheter was removed and a dressing was applied. The patient tolerated the procedure well without immediate post procedural complication. FINDINGS: A total of approximately 3.3 liters of hazy, light yellow fluid was removed. Samples were sent to the laboratory as requested by the clinical team. IMPRESSION: Successful ultrasound-guided diagnostic and therapeutic paracentesis yielding 3.3 liters of peritoneal fluid. Read by: Rowe Robert, PA-C Electronically Signed   By: Lucrezia Europe M.D.   On: 09/02/2017 16:20   Dg Chest Port 1 View  Result Date: 09/27/2017 CLINICAL DATA:  Renal failure.  History of lymphoma. EXAM: PORTABLE CHEST 1 VIEW COMPARISON:  Chest x-ray dated September 01, 2017. FINDINGS: Unchanged right chest wall port catheter. Stable cardiomegaly. Resolved pulmonary edema. Unchanged small left greater than right pleural effusions with left lower lobe atelectasis. New atelectasis in the medial right lung base. No pneumothorax. No acute osseous abnormality. IMPRESSION: 1. Unchanged left greater than right pleural effusions. 2. Persistent left lower lobe consolidation/atelectasis. Increased medial right basilar atelectasis. 3. Resolved pulmonary edema. Electronically Signed   By: Titus Dubin M.D.   On: 09/27/2017 12:26   Ct Renal Stone Study  Result Date: 09/27/2017 CLINICAL DATA:  73 year old female with malignant lymphoma. Difficulty going to the bathroom for the past couple days. Subsequent encounter. EXAM: CT ABDOMEN AND PELVIS WITHOUT CONTRAST TECHNIQUE: Multidetector CT imaging of the abdomen and pelvis was performed following the standard protocol without IV contrast. COMPARISON:  09/27/2017 CT.  08/31/2017 CT.  07/22/2017 PET-CT. FINDINGS: Lower chest: Bibasilar  atelectasis versus infiltrates with small pleural effusions greater on the left. Medial right middle lobe atelectasis/scarring. Cardiomegaly. Central line tip caval atrial junction. Coronary artery calcifications. Aortic valve calcifications. Probable anemia given density of cardiac contents. Hepatobiliary: Taking into account limitation by non contrast imaging, no focal hepatic lesion. Radiopaque material and gallbladder may represent sludge. Pancreas: Taking into account limitation by non contrast imaging, no focal pancreatic lesion. Spleen: Taking into account limitation by non contrast imaging, splenomegaly without focal lesion. Adrenals/Urinary Tract: Mild right-sided hydronephrosis. Etiology indeterminate as no obstructing calcified stone is noted. Bladder decompressed by Foley catheter. Taking into account limitation by non contrast imaging, no renal or adrenal mass. Tiny nonobstructing left renal calculus versus vascular calcification. Stomach/Bowel: Evaluation of bowel limited by ascites and third spacing of fluid. Contrast is seen throughout the colon suggesting no high-grade obstructing lesion. Stomach tenting along the anterior wall which may be related to prior gastrostomy placement. Small fat containing hernia with overlying skin defect inferior to this level unchanged. Vascular/Lymphatic: Atherosclerotic changes aorta and aortic branch vessels. No abdominal aortic aneurysm. Increase number of normal size lymph nodes retroperitoneal and mesenteric region. Reproductive: No adnexal or uterine mass noted. Other: Moderate amount of ascites and third spacing of fluid. No free intraperitoneal air. Musculoskeletal: No acute osseous abnormality. IMPRESSION: Mild right-sided hydronephrosis. Etiology indeterminate as no obstructing calcified stone is noted. Contrast throughout colon without evidence of obstruction. Ascites and third spacing of fluid. Splenomegaly. Bibasilar atelectasis/infiltrates with small  pleural effusions. Cardiomegaly. Aortic Atherosclerosis (ICD10-I70.0). Electronically Signed   By: Genia Del M.D.   On: 09/27/2017 17:08   Ct Renal Stone Study  Result Date: 08/31/2017 CLINICAL DATA:  Right-sided flank pain. EXAM: CT ABDOMEN AND PELVIS WITHOUT CONTRAST TECHNIQUE: Multidetector CT imaging of the abdomen and pelvis was performed following the standard protocol without IV contrast. COMPARISON:  07/22/2017  PET-CT FINDINGS: Lower chest: Small left and trace right pleural effusion. Moderate pericardial effusion, stable. Small hiatal hernia. Hepatobiliary: High-density liver without focal abnormality. The caudate is prominent but the liver surface is not definitely nodular. Negative gallbladder and common bile duct. Pancreas: Unremarkable. Spleen: Chronic splenomegaly, recently evaluated by PET. AP span is 19 cm. Adrenals/Urinary Tract: Negative adrenals. No hydronephrosis or stone. Unremarkable bladder. Stomach/Bowel:  No obstruction. No inflammatory changes Vascular/Lymphatic: Atherosclerotic calcification. No mass or adenopathy. Reproductive:Negative Other: Moderate ascites that is non loculated, increased. There is marked anasarca, progressed. Small periumbilical hernia containing ascitic fluid. Musculoskeletal: No acute abnormalities. IMPRESSION: 1. Generalized volume overload. Marked anasarca and moderate ascites has progressed from 07/22/2017. Small left effusion and moderate pericardial effusion are similar to prior. 2. Chronic splenomegaly, reference PET CT last month. Electronically Signed   By: Monte Fantasia M.D.   On: 08/31/2017 18:55     CBC Recent Labs  Lab 09/27/17 0807 09/27/17 1300 09/28/17 0529 09/29/17 0550  WBC 1.3* 1.2* 1.1* 1.6*  HGB 9.5* 8.0* 8.2* 8.2*  HCT 27.6* 23.2* 24.3* 24.3*  PLT 118* 122* 123* 147*  MCV 81.4 81.7 82.7 82.1  MCH 28.0 28.2 27.9 27.7  MCHC 34.4 34.5 33.7 33.7  RDW 14.9* 14.9 15.0 14.9  LYMPHSABS 0.3* 0.4*  --  0.4*  MONOABS 0.4 0.4   --  0.4  EOSABS 0.0 0.0  --  0.2  BASOSABS 0.0 0.0  --  0.0    Chemistries  Recent Labs  Lab 09/27/17 0807 09/27/17 1300 09/28/17 0529 09/29/17 0550  NA 130* 131* 133* 133*  K 4.7 4.6 3.9 3.6  CL 99 100 103 105  CO2 17* 19* 18* 19*  GLUCOSE 124* 120* 115* 125*  BUN 67* 69* 60* 54*  CREATININE 6.03* 6.29* 4.81* 3.68*  CALCIUM 8.2* 7.8* 7.8* 7.6*  MG 2.3  --   --   --   AST 13* 13* 11*  --   ALT 8 10 9   --   ALKPHOS 89 70 63  --   BILITOT 0.4 0.6 0.4  --    ------------------------------------------------------------------------------------------------------------------ estimated creatinine clearance is 15 mL/min (A) (by C-G formula based on SCr of 3.68 mg/dL (H)). ------------------------------------------------------------------------------------------------------------------ No results for input(s): HGBA1C in the last 72 hours. ------------------------------------------------------------------------------------------------------------------ No results for input(s): CHOL, HDL, LDLCALC, TRIG, CHOLHDL, LDLDIRECT in the last 72 hours. ------------------------------------------------------------------------------------------------------------------ No results for input(s): TSH, T4TOTAL, T3FREE, THYROIDAB in the last 72 hours.  Invalid input(s): FREET3 ------------------------------------------------------------------------------------------------------------------ No results for input(s): VITAMINB12, FOLATE, FERRITIN, TIBC, IRON, RETICCTPCT in the last 72 hours.  Coagulation profile Recent Labs  Lab 09/27/17 1300  INR 1.19    No results for input(s): DDIMER in the last 72 hours.  Cardiac Enzymes No results for input(s): CKMB, TROPONINI, MYOGLOBIN in the last 168 hours.  Invalid input(s): CK ------------------------------------------------------------------------------------------------------------------ Invalid input(s): POCBNP   CBG: Recent Labs  Lab  09/28/17 2107  GLUCAP 140*       Studies: Mr Lumbar Spine Wo Contrast  Result Date: 09/28/2017 CLINICAL DATA:  Initial evaluation for recent increase in lower back pain. Urinary retention with constipation. History of lymphoma, CHF, renal failure with ascites. EXAM: MRI LUMBAR SPINE WITHOUT CONTRAST MRI SACRUM WITHOUT CONTRAST TECHNIQUE: Multiplanar, multisequence MR imaging of the lumbar spine and sacrum was performed. No intravenous contrast was administered. COMPARISON:  Prior CT from 09/27/2017 FINDINGS: MRI LUMBAR SPINE: Segmentation: Examination severely limited due to poor signal strain due to patient's extensive ascites. Multiple sequences are fairly limited and essentially nondiagnostic  in nature, limiting evaluation of the spine. Normal segmentation. Lowest well-formed disc labeled the L5-S1 level. Alignment: Vertebral bodies normally aligned with preservation of the normal lumbar lordosis. No listhesis or subluxation. Vertebrae: Vertebral body heights are grossly maintained without appreciable acute or chronic fracture. Mild height loss at the superior endplate of L3 favored to be degenerative in nature. Bone marrow signal intensity poorly evaluated on this exam due to technical limitations. No obvious discrete or focal osseous lesion. Conus medullaris and cauda equina: Conus extends to approximately the T12-L1 level. No evidence for cord compression. Cauda equina in conus medullaris appear grossly within normal limits. Paraspinal and other soft tissues: Paraspinous soft tissues demonstrate no obvious acute abnormality. Diffuse anasarca noted. Intra-abdominal ascites better evaluated on prior CT. Partially visualized visceral structures not well assessed on this examination. Disc levels: T12-L1: Mild diffuse disc bulge with disc desiccation. Superimposed left paracentral disc protrusion mildly indents the left ventral thecal sac (series 4, image 7). No significant stenosis. Foramina remain  patent. L1-2:  Unremarkable. L2-3: Suspected mild disc desiccation without significant disc bulge. No appreciable stenosis. L3-4:  Grossly unremarkable.  No appreciable stenosis. L4-5: Mild disc bulge with disc desiccation. Suspected mild facet hypertrophy. Resultant mild canal with bilateral lateral recess narrowing. Probable mild bilateral foraminal stenosis. L5-S1:  Mild facet hypertrophy.  No appreciable stenosis. MRI SACRUM: Examination severely limited due to extensive ascites and resultant poor signal strength. Multiple sequences are nearly nondiagnostic. Sacrum grossly intact without obvious acute abnormality. SI joints approximated and symmetric. Visualized bones of the pelvis demonstrate no obvious abnormality. Bone marrow signal intensity poorly evaluated on this exam due to technical limitations. No obvious discrete osseous lesion. Extensive ascites noted within the visualized abdomen and pelvis. Large volume retained stool within the distal colon and rectal vault, suggesting constipation. Remainder of the pelvis poorly evaluated on this exam. Diffuse anasarca noted.  No other obvious soft tissue abnormality. IMPRESSION: MRI LUMBAR SPINE IMPRESSION 1. Severely limited study due to extensive ascites and resultant poor signal strength. 2. No evidence for cord compression or significant spinal stenosis within the lumbar spine. No obvious acute abnormality. 3. Left paracentral disc protrusion at T12-L1 without significant stenosis. 4. Mild disc bulge and facet hypertrophy at L4-5 with resultant mild spinal stenosis. 5. Large volume ascites, better evaluated on recent CT. MRI SACRUM IMPRESSION 1. Severely limited study due to extensive ascites and resultant poor signal strength. Multiple sequences are essentially nondiagnostic. 2. No obvious acute abnormality about the sacrum. 3. Large volume retained stool within the distal colon and rectal vault, suggesting constipation. 4. Large volume ascites, better  evaluated on recent CT. Electronically Signed   By: Jeannine Boga M.D.   On: 09/28/2017 19:32   US Renal  Result Date: 09/27/2017 CLINICAL DATA:  Acute kidney injury. History of diabetes, hypertension. EXAM: RENAL / URINARY TRACT ULTRASOUND COMPLETE COMPARISON:  CT abdomen and pelvis August 31, 2017 FINDINGS: Right Kidney: Length: 12 cm. Increased parenchymal echogenicity. Mild hydronephrosis. No suspicious mass. Left Kidney: Length: 9.8 cm. Increased parenchymal echogenicity. No suspicious mass or hydronephrosis visualized. Bladder: Foley catheter. Anechoic free fluid.  Nodular liver.  Anasarca. IMPRESSION: 1. Echogenic kidneys compatible with medical renal disease. 2. Mild RIGHT hydronephrosis. 3. Cirrhosis and ascites. Electronically Signed   By: Elon Alas M.D.   On: 09/27/2017 15:52   Mr Sacrum Si Joints Wo Contrast  Result Date: 09/28/2017 CLINICAL DATA:  Initial evaluation for recent increase in lower back pain. Urinary retention with constipation. History of lymphoma, CHF,  renal failure with ascites. EXAM: MRI LUMBAR SPINE WITHOUT CONTRAST MRI SACRUM WITHOUT CONTRAST TECHNIQUE: Multiplanar, multisequence MR imaging of the lumbar spine and sacrum was performed. No intravenous contrast was administered. COMPARISON:  Prior CT from 09/27/2017 FINDINGS: MRI LUMBAR SPINE: Segmentation: Examination severely limited due to poor signal strain due to patient's extensive ascites. Multiple sequences are fairly limited and essentially nondiagnostic in nature, limiting evaluation of the spine. Normal segmentation. Lowest well-formed disc labeled the L5-S1 level. Alignment: Vertebral bodies normally aligned with preservation of the normal lumbar lordosis. No listhesis or subluxation. Vertebrae: Vertebral body heights are grossly maintained without appreciable acute or chronic fracture. Mild height loss at the superior endplate of L3 favored to be degenerative in nature. Bone marrow signal intensity  poorly evaluated on this exam due to technical limitations. No obvious discrete or focal osseous lesion. Conus medullaris and cauda equina: Conus extends to approximately the T12-L1 level. No evidence for cord compression. Cauda equina in conus medullaris appear grossly within normal limits. Paraspinal and other soft tissues: Paraspinous soft tissues demonstrate no obvious acute abnormality. Diffuse anasarca noted. Intra-abdominal ascites better evaluated on prior CT. Partially visualized visceral structures not well assessed on this examination. Disc levels: T12-L1: Mild diffuse disc bulge with disc desiccation. Superimposed left paracentral disc protrusion mildly indents the left ventral thecal sac (series 4, image 7). No significant stenosis. Foramina remain patent. L1-2:  Unremarkable. L2-3: Suspected mild disc desiccation without significant disc bulge. No appreciable stenosis. L3-4:  Grossly unremarkable.  No appreciable stenosis. L4-5: Mild disc bulge with disc desiccation. Suspected mild facet hypertrophy. Resultant mild canal with bilateral lateral recess narrowing. Probable mild bilateral foraminal stenosis. L5-S1:  Mild facet hypertrophy.  No appreciable stenosis. MRI SACRUM: Examination severely limited due to extensive ascites and resultant poor signal strength. Multiple sequences are nearly nondiagnostic. Sacrum grossly intact without obvious acute abnormality. SI joints approximated and symmetric. Visualized bones of the pelvis demonstrate no obvious abnormality. Bone marrow signal intensity poorly evaluated on this exam due to technical limitations. No obvious discrete osseous lesion. Extensive ascites noted within the visualized abdomen and pelvis. Large volume retained stool within the distal colon and rectal vault, suggesting constipation. Remainder of the pelvis poorly evaluated on this exam. Diffuse anasarca noted.  No other obvious soft tissue abnormality. IMPRESSION: MRI LUMBAR SPINE  IMPRESSION 1. Severely limited study due to extensive ascites and resultant poor signal strength. 2. No evidence for cord compression or significant spinal stenosis within the lumbar spine. No obvious acute abnormality. 3. Left paracentral disc protrusion at T12-L1 without significant stenosis. 4. Mild disc bulge and facet hypertrophy at L4-5 with resultant mild spinal stenosis. 5. Large volume ascites, better evaluated on recent CT. MRI SACRUM IMPRESSION 1. Severely limited study due to extensive ascites and resultant poor signal strength. Multiple sequences are essentially nondiagnostic. 2. No obvious acute abnormality about the sacrum. 3. Large volume retained stool within the distal colon and rectal vault, suggesting constipation. 4. Large volume ascites, better evaluated on recent CT. Electronically Signed   By: Jeannine Boga M.D.   On: 09/28/2017 19:32   Dg Chest Port 1 View  Result Date: 09/27/2017 CLINICAL DATA:  Renal failure.  History of lymphoma. EXAM: PORTABLE CHEST 1 VIEW COMPARISON:  Chest x-ray dated September 01, 2017. FINDINGS: Unchanged right chest wall port catheter. Stable cardiomegaly. Resolved pulmonary edema. Unchanged small left greater than right pleural effusions with left lower lobe atelectasis. New atelectasis in the medial right lung base. No pneumothorax. No acute osseous abnormality.  IMPRESSION: 1. Unchanged left greater than right pleural effusions. 2. Persistent left lower lobe consolidation/atelectasis. Increased medial right basilar atelectasis. 3. Resolved pulmonary edema. Electronically Signed   By: Titus Dubin M.D.   On: 09/27/2017 12:26   Ct Renal Stone Study  Result Date: 09/27/2017 CLINICAL DATA:  72 year old female with malignant lymphoma. Difficulty going to the bathroom for the past couple days. Subsequent encounter. EXAM: CT ABDOMEN AND PELVIS WITHOUT CONTRAST TECHNIQUE: Multidetector CT imaging of the abdomen and pelvis was performed following the standard  protocol without IV contrast. COMPARISON:  09/27/2017 CT.  08/31/2017 CT.  07/22/2017 PET-CT. FINDINGS: Lower chest: Bibasilar atelectasis versus infiltrates with small pleural effusions greater on the left. Medial right middle lobe atelectasis/scarring. Cardiomegaly. Central line tip caval atrial junction. Coronary artery calcifications. Aortic valve calcifications. Probable anemia given density of cardiac contents. Hepatobiliary: Taking into account limitation by non contrast imaging, no focal hepatic lesion. Radiopaque material and gallbladder may represent sludge. Pancreas: Taking into account limitation by non contrast imaging, no focal pancreatic lesion. Spleen: Taking into account limitation by non contrast imaging, splenomegaly without focal lesion. Adrenals/Urinary Tract: Mild right-sided hydronephrosis. Etiology indeterminate as no obstructing calcified stone is noted. Bladder decompressed by Foley catheter. Taking into account limitation by non contrast imaging, no renal or adrenal mass. Tiny nonobstructing left renal calculus versus vascular calcification. Stomach/Bowel: Evaluation of bowel limited by ascites and third spacing of fluid. Contrast is seen throughout the colon suggesting no high-grade obstructing lesion. Stomach tenting along the anterior wall which may be related to prior gastrostomy placement. Small fat containing hernia with overlying skin defect inferior to this level unchanged. Vascular/Lymphatic: Atherosclerotic changes aorta and aortic branch vessels. No abdominal aortic aneurysm. Increase number of normal size lymph nodes retroperitoneal and mesenteric region. Reproductive: No adnexal or uterine mass noted. Other: Moderate amount of ascites and third spacing of fluid. No free intraperitoneal air. Musculoskeletal: No acute osseous abnormality. IMPRESSION: Mild right-sided hydronephrosis. Etiology indeterminate as no obstructing calcified stone is noted. Contrast throughout colon  without evidence of obstruction. Ascites and third spacing of fluid. Splenomegaly. Bibasilar atelectasis/infiltrates with small pleural effusions. Cardiomegaly. Aortic Atherosclerosis (ICD10-I70.0). Electronically Signed   By: Genia Del M.D.   On: 09/27/2017 17:08      Lab Results  Component Value Date   HGBA1C 6.2 (H) 09/05/2017   HGBA1C 6.1 (H) 09/02/2017   HGBA1C 4.5 (L) 07/08/2015   Lab Results  Component Value Date   LDLCALC 94 06/19/2015   CREATININE 3.68 (H) 09/29/2017       Scheduled Meds: . amLODipine  10 mg Oral Daily  . chlorhexidine  15 mL Mouth/Throat BID  . cholecalciferol  400 Units Oral Daily  . diltiazem  120 mg Oral Q8H  . famotidine  20 mg Oral QHS  . feeding supplement (GLUCERNA SHAKE)  237 mL Oral TID BM  . ferrous sulfate  325 mg Oral BID PC  . fluticasone  1 spray Each Nare Daily  . heparin injection (subcutaneous)  5,000 Units Subcutaneous Q8H  . hydrALAZINE  100 mg Oral TID  . lactulose  20 g Oral BID  . levothyroxine  224 mcg Oral QAC breakfast  . multivitamin with minerals  1 tablet Oral Daily  . vitamin B-12  500 mcg Oral Daily   Continuous Infusions: . albumin human 12.5 g (09/29/17 1057)  . piperacillin-tazobactam (ZOSYN)  IV 2.25 g (09/29/17 1059)     LOS: 2 days    Time spent: >30 MINS    Clarice Bonaventure  Oris Calmes  [facility] Pager [phone]. If 7PM-7AM, please contact night-coverage at [email or web address], password [facility] 09/29/2017, 12:11 PM  LOS: 2 days

## 2017-09-29 NOTE — Progress Notes (Signed)
OT Cancellation Note  Patient Details Name: Emily Kennedy MRN: [id number] DOB: 1945/04/15   Cancelled Treatment:    Reason Eval/Treat Not Completed: Other (comment).  Pt states that she doesn't want to do Occupational Therapy while she is here.  "I have been through enough". Will sign off.  Johanan Skorupski 09/29/2017, 2:15 PM  Lesle Chris, OTR/L [phone] 09/29/2017

## 2017-09-30 LAB — COMPREHENSIVE METABOLIC PANEL
ALBUMIN: 2.4 g/dL — AB (ref 3.5–5.0)
ALT: 9 U/L (ref 0–44)
AST: 12 U/L — AB (ref 15–41)
Alkaline Phosphatase: 52 U/L (ref 38–126)
Anion gap: 9 (ref 5–15)
BILIRUBIN TOTAL: 0.4 mg/dL (ref 0.3–1.2)
BUN: 46 mg/dL — AB (ref 8–23)
CO2: 21 mmol/L — ABNORMAL LOW (ref 22–32)
CREATININE: 2.64 mg/dL — AB (ref 0.44–1.00)
Calcium: 7.8 mg/dL — ABNORMAL LOW (ref 8.9–10.3)
Chloride: 105 mmol/L (ref 98–111)
GFR calc Af Amer: 20 mL/min — ABNORMAL LOW (ref >60)
GFR, EST NON AFRICAN AMERICAN: 17 mL/min — AB (ref >60)
GLUCOSE: 124 mg/dL — AB (ref 70–99)
Potassium: 3.1 mmol/L — ABNORMAL LOW (ref 3.5–5.1)
Sodium: 135 mmol/L (ref 135–145)
TOTAL PROTEIN: 4.5 g/dL — AB (ref 6.5–8.1)

## 2017-09-30 LAB — PHOSPHORUS: Phosphorus: 4 mg/dL (ref 2.5–4.6)

## 2017-09-30 MED ORDER — SODIUM CHLORIDE 0.9% FLUSH: 10.0000 mL | INTRAVENOUS | Status: DC | PRN

## 2017-09-30 MED ORDER — SODIUM CHLORIDE 0.9% FLUSH: 10.0000 mL | Freq: Two times a day (BID) | INTRAVENOUS | Status: DC

## 2017-09-30 MED ORDER — POTASSIUM CHLORIDE CRYS ER 20 MEQ PO TBCR
40.0000 meq | EXTENDED_RELEASE_TABLET | Freq: Once | ORAL | Status: AC
  Administered 2017-09-30: 40 meq via ORAL
  Filled 2017-09-30: qty 2

## 2017-09-30 MED ORDER — MORPHINE SULFATE (PF) 2 MG/ML IV SOLN
2.0000 mg | Freq: Once | INTRAVENOUS | Status: DC
  Filled 2017-09-30: qty 1

## 2017-09-30 MED ORDER — PIPERACILLIN-TAZOBACTAM 3.375 G IVPB
3.3750 g | Freq: Three times a day (TID) | INTRAVENOUS | Status: DC
  Administered 2017-09-30 – 2017-10-01 (×3): 3.375 g via INTRAVENOUS
  Filled 2017-09-30 (×2): qty 50

## 2017-09-30 NOTE — Clinical Social Work Placement (Signed)
Patient's daughter provided with bed offer to [facility] and accepted. CSW will continue to follow and assist with discharge planning.  CLINICAL SOCIAL WORK PLACEMENT  NOTE  Date:  09/30/2017  Patient Details  Name: Ryleah Miramontes MRN: [id number] Date of Birth: [birth year]  Clinical Social Work is seeking post-discharge placement for this patient at the [place] level of care (*CSW will initial, date and re-position this form in  chart as items are completed):  Yes   Patient/family provided with [place] Work Department's list of facilities offering this level of care within the geographic area requested by the patient (or if unable, by the patient's family).  Yes   Patient/family informed of their freedom to choose among providers that offer the needed level of care, that participate in Medicare, Medicaid or managed care program needed by the patient, have an available bed and are willing to accept the patient.  Yes   Patient/family informed of [facility]'s ownership interest in [facility] and [facility], as well as of the fact that they are under no obligation to receive care at these facilities.  PASRR submitted to EDS on       PASRR number received on       Existing PASRR number confirmed on 09/29/17     FL2 transmitted to all facilities in geographic area requested by pt/family on 09/29/17     FL2 transmitted to all facilities within larger geographic area on       Patient informed that his/her managed care company has contracts with or will negotiate with certain facilities, including the following:        Yes   Patient/family informed of bed offers received.  Patient chooses bed at [facility]     Physician recommends and patient chooses bed at      Patient to be transferred to   on  .  Patient to be transferred to facility by       Patient family notified on   of transfer.  Name of family member notified:         PHYSICIAN       Additional Comment:    _______________________________________________ Burnis Medin, LCSW 09/30/2017, 2:44 PM

## 2017-09-30 NOTE — Progress Notes (Signed)
Patient was complaining of 10/10 right leg pain this morning. Patient told RN to take the ACE and gauze off from her leg, so RN took it off. Once Rn took ACE wrap off, patient stated that her pain went down to a 5. RN also notified on call of the pain and got a one time dose of pain medicine, but patient decided not to take it.

## 2017-09-30 NOTE — Progress Notes (Signed)
[facility] PROGRESS NOTE  An Lannan KPT:[id number] DOB: 03/08/1946 DOA: 09/27/2017   PCP: [name], MD     Assessment/Plan: Principal Problem:   ARF (acute renal failure) ([place]) Active Problems:   Pancytopenia ([place])   Acute lower UTI    72 y.o.femalewith medical history significant ofHTN, HLD,CHF, DM type II, vasculitis, and malignant lymphoma currently receiving chemotherapy followed by [name] of oncology who presents with not feeling well.  Slight nausea.  Denies fever, chills, flank pain, diarrhea, brbpr, dysuria, hematuria.  Pt was brought to ED  Because of a creatinine of 6.29.urinary retention, constipation for 5 days.  Assessment and plan  ARF, improving Patient referred from oncology for acute renal failure due to pre renal and post renal causes. Baseline creatinine 1.6, presented with a creatinine of 6.29, now 2.64.Bladder scan showed about 1 L of urine was in urinary retention, Foley catheter placed Patient also found to have a urinary tract infection, CT stone protocol shows mild right-sided hydronephrosis, Status with IV fluids/albumin given extremely low albumin of 1.6, Uric acid 7.3 , may benefit from urate lowering therapy  Constipation/urinary retention Patient has had 1 bowel movement today..  Patient states she is bedbound at baseline, will need SNF, Continue constipation protocol. Continue Dulcolax suppository      UTI, currently on Zosyn Negative urine culture    Pancytopenia White count 1.7 , hemoglobin and platelets are stable. Continue to monitor  Hypertension Controlled Cont amlodipine 10mg  po qday Cont cardizem 120mg  po q8h Cont hydralazine 100mg  po tid  Hypothyroidism Cont levothyroxine 224 micrograms po qday  Gerd Cont H2 Blocker    DVT prophylaxsis  Heparin [platelets stable]  Code Status:  DO NOT RESUSCITATE   Family Communication: Discussed in detail with the patient, all imaging results, lab results  explained to the patient   Disposition Plan:  To SNF when ready      Consultants:  none  Procedures:  none  Antibiotics: Anti-infectives (From admission, onward)   Start     Dose/Rate Route Frequency Ordered Stop   09/28/17 0200  piperacillin-tazobactam (ZOSYN) IVPB 2.25 g     2.25 g 100 mL/hr over 30 Minutes Intravenous Every 8 hours 09/27/17 2136     09/27/17 1815  piperacillin-tazobactam (ZOSYN) IVPB 2.25 g     2.25 g 100 mL/hr over 30 Minutes Intravenous  Once 09/27/17 1804 09/27/17 1859         HPI/Subjective: Still very weak and debilitated.  One bowel movement.  No new complaint Objective: Vitals:   09/29/17 1405 09/29/17 2113 09/30/17 0452 09/30/17 0500  BP: (!) 151/68 (!) 152/64 (!) 153/70   Pulse: 85 86 89   Resp: 18 18 18    Temp: 98.3 F (36.8 C) 98.2 F (36.8 C) 97.8 F (36.6 C)   TempSrc: Oral     SpO2: 98% 99% 98%   Weight:    87.9 kg (193 lb 12.6 oz)  Height:        Intake/Output Summary (Last 24 hours) at 09/30/2017 0732 Last data filed at 09/30/2017 0300 Gross per 24 hour  Intake 533 ml  Output 1200 ml  Net -667 ml    Exam:  Examination:  General exam:  Chronically ill appearing Respiratory system: Clear to auscultation. Respiratory effort normal. Cardiovascular system: S1 & S2 heard, RRR. No JVD, murmurs, rubs, gallops or clicks. No pedal edema. Gastrointestinal system: Abdomen  Generalized distention, soft and nontender. No organomegaly or masses felt. Normal bowel  sounds heard. Central nervous system: Alert and oriented. No focal neurological deficits. Extremities:  Strength 2/5 in bilateral lower extremities, 2+ pitting edema Skin: No rashes, lesions or ulcers Psychiatry: Judgement and insight appear normal. Mood & affect appropriate.     Data Reviewed: I have personally reviewed following labs and imaging studies  Micro Results Recent Results (from the past 240 hour(s))  Urine culture     Status: None   Collection Time:  09/27/17  2:03 PM  Result Value Ref Range Status   Specimen Description   Final    URINE, CLEAN CATCH Performed at [facility] [address] [place], [place] [address]    Special Requests   Final    Normal Performed at [facility], [place] [address], [place], [place] [address]    Culture   Final    NO GROWTH Performed at [facility], [place] [address], [place],  [address]    Report Status 09/29/2017 FINAL  Final    Radiology Reports Dg Chest 1 View  Result Date: 09/01/2017 CLINICAL DATA:  Congestive heart failure EXAM: CHEST  1 VIEW COMPARISON:  August 31, 2017 FINDINGS: The heart size and mediastinal contours are stable. Right central venous line is identified unchanged. Heart size is enlarged. There is pulmonary edema. Consolidation of left lung base with left pleural effusion is identified. Minimal right pleural effusion is noted. The visualized skeletal structures are stable. IMPRESSION: Congestive heart failure. Bilateral pleural effusions. Consolidation of left lung base underlying pneumonia is not excluded. Electronically Signed   By: Abelardo Diesel M.D.   On: 09/01/2017 15:25   Dg Chest 2 View  Result Date: 08/31/2017 CLINICAL DATA:  Fever. EXAM: CHEST - 2 VIEW COMPARISON:  Chest x-ray dated August 14, 2017. FINDINGS: Unchanged right chest wall port catheter with tip at the cavoatrial junction. Stable cardiomegaly. Normal pulmonary vascularity. Unchanged small left pleural effusion. No consolidation or pneumothorax. No acute osseous abnormality. IMPRESSION: 1. Unchanged small left pleural effusion. Electronically Signed   By: Titus Dubin M.D.   On: 08/31/2017 15:39   Dg Abd 1 View  Result Date: 09/01/2017 CLINICAL DATA:  Congestive heart failure, diabetes and vasculitis. EXAM: ABDOMEN - 1 VIEW COMPARISON:  None. FINDINGS: There is no bowel obstruction or free air. Consolidation left lung base is identified. There bilateral pleural  effusions. IMPRESSION: No bowel obstruction or free air. Consolidation left lung base identified. Electronically Signed   By: Abelardo Diesel M.D.   On: 09/01/2017 15:26   Mr Lumbar Spine Wo Contrast  Result Date: 09/28/2017 CLINICAL DATA:  Initial evaluation for recent increase in lower back pain. Urinary retention with constipation. History of lymphoma, CHF, renal failure with ascites. EXAM: MRI LUMBAR SPINE WITHOUT CONTRAST MRI SACRUM WITHOUT CONTRAST TECHNIQUE: Multiplanar, multisequence MR imaging of the lumbar spine and sacrum was performed. No intravenous contrast was administered. COMPARISON:  Prior CT from 09/27/2017 FINDINGS: MRI LUMBAR SPINE: Segmentation: Examination severely limited due to poor signal strain due to patient's extensive ascites. Multiple sequences are fairly limited and essentially nondiagnostic in nature, limiting evaluation of the spine. Normal segmentation. Lowest well-formed disc labeled the L5-S1 level. Alignment: Vertebral bodies normally aligned with preservation of the normal lumbar lordosis. No listhesis or subluxation. Vertebrae: Vertebral body heights are grossly maintained without appreciable acute or chronic fracture. Mild height loss at the superior endplate of L3 favored to be degenerative in nature. Bone marrow signal intensity poorly evaluated on this exam due to technical limitations.  No obvious discrete or focal osseous lesion. Conus medullaris and cauda equina: Conus extends to approximately the T12-L1 level. No evidence for cord compression. Cauda equina in conus medullaris appear grossly within normal limits. Paraspinal and other soft tissues: Paraspinous soft tissues demonstrate no obvious acute abnormality. Diffuse anasarca noted. Intra-abdominal ascites better evaluated on prior CT. Partially visualized visceral structures not well assessed on this examination. Disc levels: T12-L1: Mild diffuse disc bulge with disc desiccation. Superimposed left paracentral disc  protrusion mildly indents the left ventral thecal sac (series 4, image 7). No significant stenosis. Foramina remain patent. L1-2:  Unremarkable. L2-3: Suspected mild disc desiccation without significant disc bulge. No appreciable stenosis. L3-4:  Grossly unremarkable.  No appreciable stenosis. L4-5: Mild disc bulge with disc desiccation. Suspected mild facet hypertrophy. Resultant mild canal with bilateral lateral recess narrowing. Probable mild bilateral foraminal stenosis. L5-S1:  Mild facet hypertrophy.  No appreciable stenosis. MRI SACRUM: Examination severely limited due to extensive ascites and resultant poor signal strength. Multiple sequences are nearly nondiagnostic. Sacrum grossly intact without obvious acute abnormality. SI joints approximated and symmetric. Visualized bones of the pelvis demonstrate no obvious abnormality. Bone marrow signal intensity poorly evaluated on this exam due to technical limitations. No obvious discrete osseous lesion. Extensive ascites noted within the visualized abdomen and pelvis. Large volume retained stool within the distal colon and rectal vault, suggesting constipation. Remainder of the pelvis poorly evaluated on this exam. Diffuse anasarca noted.  No other obvious soft tissue abnormality. IMPRESSION: MRI LUMBAR SPINE IMPRESSION 1. Severely limited study due to extensive ascites and resultant poor signal strength. 2. No evidence for cord compression or significant spinal stenosis within the lumbar spine. No obvious acute abnormality. 3. Left paracentral disc protrusion at T12-L1 without significant stenosis. 4. Mild disc bulge and facet hypertrophy at L4-5 with resultant mild spinal stenosis. 5. Large volume ascites, better evaluated on recent CT. MRI SACRUM IMPRESSION 1. Severely limited study due to extensive ascites and resultant poor signal strength. Multiple sequences are essentially nondiagnostic. 2. No obvious acute abnormality about the sacrum. 3. Large volume  retained stool within the distal colon and rectal vault, suggesting constipation. 4. Large volume ascites, better evaluated on recent CT. Electronically Signed   By: Jeannine Boga M.D.   On: 09/28/2017 19:32   US Renal  Result Date: 09/27/2017 CLINICAL DATA:  Acute kidney injury. History of diabetes, hypertension. EXAM: RENAL / URINARY TRACT ULTRASOUND COMPLETE COMPARISON:  CT abdomen and pelvis August 31, 2017 FINDINGS: Right Kidney: Length: 12 cm. Increased parenchymal echogenicity. Mild hydronephrosis. No suspicious mass. Left Kidney: Length: 9.8 cm. Increased parenchymal echogenicity. No suspicious mass or hydronephrosis visualized. Bladder: Foley catheter. Anechoic free fluid.  Nodular liver.  Anasarca. IMPRESSION: 1. Echogenic kidneys compatible with medical renal disease. 2. Mild RIGHT hydronephrosis. 3. Cirrhosis and ascites. Electronically Signed   By: Elon Alas M.D.   On: 09/27/2017 15:52   Mr Sacrum Si Joints Wo Contrast  Result Date: 09/28/2017 CLINICAL DATA:  Initial evaluation for recent increase in lower back pain. Urinary retention with constipation. History of lymphoma, CHF, renal failure with ascites. EXAM: MRI LUMBAR SPINE WITHOUT CONTRAST MRI SACRUM WITHOUT CONTRAST TECHNIQUE: Multiplanar, multisequence MR imaging of the lumbar spine and sacrum was performed. No intravenous contrast was administered. COMPARISON:  Prior CT from 09/27/2017 FINDINGS: MRI LUMBAR SPINE: Segmentation: Examination severely limited due to poor signal strain due to patient's extensive ascites. Multiple sequences are fairly limited and essentially nondiagnostic in nature, limiting evaluation of the spine. Normal segmentation.  Lowest well-formed disc labeled the L5-S1 level. Alignment: Vertebral bodies normally aligned with preservation of the normal lumbar lordosis. No listhesis or subluxation. Vertebrae: Vertebral body heights are grossly maintained without appreciable acute or chronic fracture. Mild  height loss at the superior endplate of L3 favored to be degenerative in nature. Bone marrow signal intensity poorly evaluated on this exam due to technical limitations. No obvious discrete or focal osseous lesion. Conus medullaris and cauda equina: Conus extends to approximately the T12-L1 level. No evidence for cord compression. Cauda equina in conus medullaris appear grossly within normal limits. Paraspinal and other soft tissues: Paraspinous soft tissues demonstrate no obvious acute abnormality. Diffuse anasarca noted. Intra-abdominal ascites better evaluated on prior CT. Partially visualized visceral structures not well assessed on this examination. Disc levels: T12-L1: Mild diffuse disc bulge with disc desiccation. Superimposed left paracentral disc protrusion mildly indents the left ventral thecal sac (series 4, image 7). No significant stenosis. Foramina remain patent. L1-2:  Unremarkable. L2-3: Suspected mild disc desiccation without significant disc bulge. No appreciable stenosis. L3-4:  Grossly unremarkable.  No appreciable stenosis. L4-5: Mild disc bulge with disc desiccation. Suspected mild facet hypertrophy. Resultant mild canal with bilateral lateral recess narrowing. Probable mild bilateral foraminal stenosis. L5-S1:  Mild facet hypertrophy.  No appreciable stenosis. MRI SACRUM: Examination severely limited due to extensive ascites and resultant poor signal strength. Multiple sequences are nearly nondiagnostic. Sacrum grossly intact without obvious acute abnormality. SI joints approximated and symmetric. Visualized bones of the pelvis demonstrate no obvious abnormality. Bone marrow signal intensity poorly evaluated on this exam due to technical limitations. No obvious discrete osseous lesion. Extensive ascites noted within the visualized abdomen and pelvis. Large volume retained stool within the distal colon and rectal vault, suggesting constipation. Remainder of the pelvis poorly evaluated on this  exam. Diffuse anasarca noted.  No other obvious soft tissue abnormality. IMPRESSION: MRI LUMBAR SPINE IMPRESSION 1. Severely limited study due to extensive ascites and resultant poor signal strength. 2. No evidence for cord compression or significant spinal stenosis within the lumbar spine. No obvious acute abnormality. 3. Left paracentral disc protrusion at T12-L1 without significant stenosis. 4. Mild disc bulge and facet hypertrophy at L4-5 with resultant mild spinal stenosis. 5. Large volume ascites, better evaluated on recent CT. MRI SACRUM IMPRESSION 1. Severely limited study due to extensive ascites and resultant poor signal strength. Multiple sequences are essentially nondiagnostic. 2. No obvious acute abnormality about the sacrum. 3. Large volume retained stool within the distal colon and rectal vault, suggesting constipation. 4. Large volume ascites, better evaluated on recent CT. Electronically Signed   By: Jeannine Boga M.D.   On: 09/28/2017 19:32   US Paracentesis  Result Date: 09/02/2017 INDICATION: Patient with history of CHF, anasarca, myelodysplastic syndrome, lymphoplasmacytic lymphoma, ascites. Request made for diagnostic and therapeutic paracentesis. EXAM: ULTRASOUND GUIDED DIAGNOSTIC AND THERAPEUTIC PARACENTESIS MEDICATIONS: None COMPLICATIONS: None immediate. PROCEDURE: Informed written consent was obtained from the patient after a discussion of the risks, benefits and alternatives to treatment. A timeout was performed prior to the initiation of the procedure. Initial ultrasound scanning demonstrates a moderate amount of ascites within the left mid to lower abdominal quadrant. The left mid to lower abdomen was prepped and draped in the usual sterile fashion. 1% lidocaine was used for local anesthesia. Following this, a 19 gauge, 7-cm, Yueh catheter was introduced. An ultrasound image was saved for documentation purposes. The paracentesis was performed. The catheter was removed and a  dressing was applied. The patient tolerated the  procedure well without immediate post procedural complication. FINDINGS: A total of approximately 3.3 liters of hazy, light yellow fluid was removed. Samples were sent to the laboratory as requested by the clinical team. IMPRESSION: Successful ultrasound-guided diagnostic and therapeutic paracentesis yielding 3.3 liters of peritoneal fluid. Read by: Rowe Robert, PA-C Electronically Signed   By: Lucrezia Europe M.D.   On: 09/02/2017 16:20   Dg Chest Port 1 View  Result Date: 09/27/2017 CLINICAL DATA:  Renal failure.  History of lymphoma. EXAM: PORTABLE CHEST 1 VIEW COMPARISON:  Chest x-ray dated September 01, 2017. FINDINGS: Unchanged right chest wall port catheter. Stable cardiomegaly. Resolved pulmonary edema. Unchanged small left greater than right pleural effusions with left lower lobe atelectasis. New atelectasis in the medial right lung base. No pneumothorax. No acute osseous abnormality. IMPRESSION: 1. Unchanged left greater than right pleural effusions. 2. Persistent left lower lobe consolidation/atelectasis. Increased medial right basilar atelectasis. 3. Resolved pulmonary edema. Electronically Signed   By: Titus Dubin M.D.   On: 09/27/2017 12:26   Ct Renal Stone Study  Result Date: 09/27/2017 CLINICAL DATA:  72 year old female with malignant lymphoma. Difficulty going to the bathroom for the past couple days. Subsequent encounter. EXAM: CT ABDOMEN AND PELVIS WITHOUT CONTRAST TECHNIQUE: Multidetector CT imaging of the abdomen and pelvis was performed following the standard protocol without IV contrast. COMPARISON:  09/27/2017 CT.  08/31/2017 CT.  07/22/2017 PET-CT. FINDINGS: Lower chest: Bibasilar atelectasis versus infiltrates with small pleural effusions greater on the left. Medial right middle lobe atelectasis/scarring. Cardiomegaly. Central line tip caval atrial junction. Coronary artery calcifications. Aortic valve calcifications. Probable anemia given  density of cardiac contents. Hepatobiliary: Taking into account limitation by non contrast imaging, no focal hepatic lesion. Radiopaque material and gallbladder may represent sludge. Pancreas: Taking into account limitation by non contrast imaging, no focal pancreatic lesion. Spleen: Taking into account limitation by non contrast imaging, splenomegaly without focal lesion. Adrenals/Urinary Tract: Mild right-sided hydronephrosis. Etiology indeterminate as no obstructing calcified stone is noted. Bladder decompressed by Foley catheter. Taking into account limitation by non contrast imaging, no renal or adrenal mass. Tiny nonobstructing left renal calculus versus vascular calcification. Stomach/Bowel: Evaluation of bowel limited by ascites and third spacing of fluid. Contrast is seen throughout the colon suggesting no high-grade obstructing lesion. Stomach tenting along the anterior wall which may be related to prior gastrostomy placement. Small fat containing hernia with overlying skin defect inferior to this level unchanged. Vascular/Lymphatic: Atherosclerotic changes aorta and aortic branch vessels. No abdominal aortic aneurysm. Increase number of normal size lymph nodes retroperitoneal and mesenteric region. Reproductive: No adnexal or uterine mass noted. Other: Moderate amount of ascites and third spacing of fluid. No free intraperitoneal air. Musculoskeletal: No acute osseous abnormality. IMPRESSION: Mild right-sided hydronephrosis. Etiology indeterminate as no obstructing calcified stone is noted. Contrast throughout colon without evidence of obstruction. Ascites and third spacing of fluid. Splenomegaly. Bibasilar atelectasis/infiltrates with small pleural effusions. Cardiomegaly. Aortic Atherosclerosis (ICD10-I70.0). Electronically Signed   By: Genia Del M.D.   On: 09/27/2017 17:08   Ct Renal Stone Study  Result Date: 08/31/2017 CLINICAL DATA:  Right-sided flank pain. EXAM: CT ABDOMEN AND PELVIS WITHOUT  CONTRAST TECHNIQUE: Multidetector CT imaging of the abdomen and pelvis was performed following the standard protocol without IV contrast. COMPARISON:  07/22/2017 PET-CT FINDINGS: Lower chest: Small left and trace right pleural effusion. Moderate pericardial effusion, stable. Small hiatal hernia. Hepatobiliary: High-density liver without focal abnormality. The caudate is prominent but the liver surface is not definitely nodular. Negative gallbladder  and common bile duct. Pancreas: Unremarkable. Spleen: Chronic splenomegaly, recently evaluated by PET. AP span is 19 cm. Adrenals/Urinary Tract: Negative adrenals. No hydronephrosis or stone. Unremarkable bladder. Stomach/Bowel:  No obstruction. No inflammatory changes Vascular/Lymphatic: Atherosclerotic calcification. No mass or adenopathy. Reproductive:Negative Other: Moderate ascites that is non loculated, increased. There is marked anasarca, progressed. Small periumbilical hernia containing ascitic fluid. Musculoskeletal: No acute abnormalities. IMPRESSION: 1. Generalized volume overload. Marked anasarca and moderate ascites has progressed from 07/22/2017. Small left effusion and moderate pericardial effusion are similar to prior. 2. Chronic splenomegaly, reference PET CT last month. Electronically Signed   By: Monte Fantasia M.D.   On: 08/31/2017 18:55     CBC Recent Labs  Lab 09/27/17 0807 09/27/17 1300 09/28/17 0529 09/29/17 0550 09/29/17 1256  WBC 1.3* 1.2* 1.1* 1.6* 1.7*  HGB 9.5* 8.0* 8.2* 8.2* 8.8*  HCT 27.6* 23.2* 24.3* 24.3* 25.6*  PLT 118* 122* 123* 147* 155  MCV 81.4 81.7 82.7 82.1 82.1  MCH 28.0 28.2 27.9 27.7 28.2  MCHC 34.4 34.5 33.7 33.7 34.4  RDW 14.9* 14.9 15.0 14.9 14.9  LYMPHSABS 0.3* 0.4*  --  0.4* 0.3*  MONOABS 0.4 0.4  --  0.4 0.4  EOSABS 0.0 0.0  --  0.2 0.2  BASOSABS 0.0 0.0  --  0.0 0.0    Chemistries  Recent Labs  Lab 09/27/17 0807 09/27/17 1300 09/28/17 0529 09/29/17 0550 09/30/17 0554  NA 130* 131* 133*  133* 135  K 4.7 4.6 3.9 3.6 3.1*  CL 99 100 103 105 105  CO2 17* 19* 18* 19* 21*  GLUCOSE 124* 120* 115* 125* 124*  BUN 67* 69* 60* 54* 46*  CREATININE 6.03* 6.29* 4.81* 3.68* 2.64*  CALCIUM 8.2* 7.8* 7.8* 7.6* 7.8*  MG 2.3  --   --   --   --   AST 13* 13* 11*  --  12*  ALT 8 10 9   --  9  ALKPHOS 89 70 63  --  52  BILITOT 0.4 0.6 0.4  --  0.4   ------------------------------------------------------------------------------------------------------------------ estimated creatinine clearance is 21.3 mL/min (A) (by C-G formula based on SCr of 2.64 mg/dL (H)). ------------------------------------------------------------------------------------------------------------------ No results for input(s): HGBA1C in the last 72 hours. ------------------------------------------------------------------------------------------------------------------ No results for input(s): CHOL, HDL, LDLCALC, TRIG, CHOLHDL, LDLDIRECT in the last 72 hours. ------------------------------------------------------------------------------------------------------------------ No results for input(s): TSH, T4TOTAL, T3FREE, THYROIDAB in the last 72 hours.  Invalid input(s): FREET3 ------------------------------------------------------------------------------------------------------------------ No results for input(s): VITAMINB12, FOLATE, FERRITIN, TIBC, IRON, RETICCTPCT in the last 72 hours.  Coagulation profile Recent Labs  Lab 09/27/17 1300  INR 1.19    No results for input(s): DDIMER in the last 72 hours.  Cardiac Enzymes No results for input(s): CKMB, TROPONINI, MYOGLOBIN in the last 168 hours.  Invalid input(s): CK ------------------------------------------------------------------------------------------------------------------ Invalid input(s): POCBNP   CBG: Recent Labs  Lab 09/28/17 2107  GLUCAP 140*       Studies: Mr Lumbar Spine Wo Contrast  Result Date: 09/28/2017 CLINICAL DATA:  Initial  evaluation for recent increase in lower back pain. Urinary retention with constipation. History of lymphoma, CHF, renal failure with ascites. EXAM: MRI LUMBAR SPINE WITHOUT CONTRAST MRI SACRUM WITHOUT CONTRAST TECHNIQUE: Multiplanar, multisequence MR imaging of the lumbar spine and sacrum was performed. No intravenous contrast was administered. COMPARISON:  Prior CT from 09/27/2017 FINDINGS: MRI LUMBAR SPINE: Segmentation: Examination severely limited due to poor signal strain due to patient's extensive ascites. Multiple sequences are fairly limited and essentially nondiagnostic in nature, limiting evaluation of the spine.  Normal segmentation. Lowest well-formed disc labeled the L5-S1 level. Alignment: Vertebral bodies normally aligned with preservation of the normal lumbar lordosis. No listhesis or subluxation. Vertebrae: Vertebral body heights are grossly maintained without appreciable acute or chronic fracture. Mild height loss at the superior endplate of L3 favored to be degenerative in nature. Bone marrow signal intensity poorly evaluated on this exam due to technical limitations. No obvious discrete or focal osseous lesion. Conus medullaris and cauda equina: Conus extends to approximately the T12-L1 level. No evidence for cord compression. Cauda equina in conus medullaris appear grossly within normal limits. Paraspinal and other soft tissues: Paraspinous soft tissues demonstrate no obvious acute abnormality. Diffuse anasarca noted. Intra-abdominal ascites better evaluated on prior CT. Partially visualized visceral structures not well assessed on this examination. Disc levels: T12-L1: Mild diffuse disc bulge with disc desiccation. Superimposed left paracentral disc protrusion mildly indents the left ventral thecal sac (series 4, image 7). No significant stenosis. Foramina remain patent. L1-2:  Unremarkable. L2-3: Suspected mild disc desiccation without significant disc bulge. No appreciable stenosis. L3-4:   Grossly unremarkable.  No appreciable stenosis. L4-5: Mild disc bulge with disc desiccation. Suspected mild facet hypertrophy. Resultant mild canal with bilateral lateral recess narrowing. Probable mild bilateral foraminal stenosis. L5-S1:  Mild facet hypertrophy.  No appreciable stenosis. MRI SACRUM: Examination severely limited due to extensive ascites and resultant poor signal strength. Multiple sequences are nearly nondiagnostic. Sacrum grossly intact without obvious acute abnormality. SI joints approximated and symmetric. Visualized bones of the pelvis demonstrate no obvious abnormality. Bone marrow signal intensity poorly evaluated on this exam due to technical limitations. No obvious discrete osseous lesion. Extensive ascites noted within the visualized abdomen and pelvis. Large volume retained stool within the distal colon and rectal vault, suggesting constipation. Remainder of the pelvis poorly evaluated on this exam. Diffuse anasarca noted.  No other obvious soft tissue abnormality. IMPRESSION: MRI LUMBAR SPINE IMPRESSION 1. Severely limited study due to extensive ascites and resultant poor signal strength. 2. No evidence for cord compression or significant spinal stenosis within the lumbar spine. No obvious acute abnormality. 3. Left paracentral disc protrusion at T12-L1 without significant stenosis. 4. Mild disc bulge and facet hypertrophy at L4-5 with resultant mild spinal stenosis. 5. Large volume ascites, better evaluated on recent CT. MRI SACRUM IMPRESSION 1. Severely limited study due to extensive ascites and resultant poor signal strength. Multiple sequences are essentially nondiagnostic. 2. No obvious acute abnormality about the sacrum. 3. Large volume retained stool within the distal colon and rectal vault, suggesting constipation. 4. Large volume ascites, better evaluated on recent CT. Electronically Signed   By: Jeannine Boga M.D.   On: 09/28/2017 19:32   Mr Sacrum Si Joints Wo  Contrast  Result Date: 09/28/2017 CLINICAL DATA:  Initial evaluation for recent increase in lower back pain. Urinary retention with constipation. History of lymphoma, CHF, renal failure with ascites. EXAM: MRI LUMBAR SPINE WITHOUT CONTRAST MRI SACRUM WITHOUT CONTRAST TECHNIQUE: Multiplanar, multisequence MR imaging of the lumbar spine and sacrum was performed. No intravenous contrast was administered. COMPARISON:  Prior CT from 09/27/2017 FINDINGS: MRI LUMBAR SPINE: Segmentation: Examination severely limited due to poor signal strain due to patient's extensive ascites. Multiple sequences are fairly limited and essentially nondiagnostic in nature, limiting evaluation of the spine. Normal segmentation. Lowest well-formed disc labeled the L5-S1 level. Alignment: Vertebral bodies normally aligned with preservation of the normal lumbar lordosis. No listhesis or subluxation. Vertebrae: Vertebral body heights are grossly maintained without appreciable acute or chronic fracture. Mild height  loss at the superior endplate of L3 favored to be degenerative in nature. Bone marrow signal intensity poorly evaluated on this exam due to technical limitations. No obvious discrete or focal osseous lesion. Conus medullaris and cauda equina: Conus extends to approximately the T12-L1 level. No evidence for cord compression. Cauda equina in conus medullaris appear grossly within normal limits. Paraspinal and other soft tissues: Paraspinous soft tissues demonstrate no obvious acute abnormality. Diffuse anasarca noted. Intra-abdominal ascites better evaluated on prior CT. Partially visualized visceral structures not well assessed on this examination. Disc levels: T12-L1: Mild diffuse disc bulge with disc desiccation. Superimposed left paracentral disc protrusion mildly indents the left ventral thecal sac (series 4, image 7). No significant stenosis. Foramina remain patent. L1-2:  Unremarkable. L2-3: Suspected mild disc desiccation without  significant disc bulge. No appreciable stenosis. L3-4:  Grossly unremarkable.  No appreciable stenosis. L4-5: Mild disc bulge with disc desiccation. Suspected mild facet hypertrophy. Resultant mild canal with bilateral lateral recess narrowing. Probable mild bilateral foraminal stenosis. L5-S1:  Mild facet hypertrophy.  No appreciable stenosis. MRI SACRUM: Examination severely limited due to extensive ascites and resultant poor signal strength. Multiple sequences are nearly nondiagnostic. Sacrum grossly intact without obvious acute abnormality. SI joints approximated and symmetric. Visualized bones of the pelvis demonstrate no obvious abnormality. Bone marrow signal intensity poorly evaluated on this exam due to technical limitations. No obvious discrete osseous lesion. Extensive ascites noted within the visualized abdomen and pelvis. Large volume retained stool within the distal colon and rectal vault, suggesting constipation. Remainder of the pelvis poorly evaluated on this exam. Diffuse anasarca noted.  No other obvious soft tissue abnormality. IMPRESSION: MRI LUMBAR SPINE IMPRESSION 1. Severely limited study due to extensive ascites and resultant poor signal strength. 2. No evidence for cord compression or significant spinal stenosis within the lumbar spine. No obvious acute abnormality. 3. Left paracentral disc protrusion at T12-L1 without significant stenosis. 4. Mild disc bulge and facet hypertrophy at L4-5 with resultant mild spinal stenosis. 5. Large volume ascites, better evaluated on recent CT. MRI SACRUM IMPRESSION 1. Severely limited study due to extensive ascites and resultant poor signal strength. Multiple sequences are essentially nondiagnostic. 2. No obvious acute abnormality about the sacrum. 3. Large volume retained stool within the distal colon and rectal vault, suggesting constipation. 4. Large volume ascites, better evaluated on recent CT. Electronically Signed   By: Jeannine Boga M.D.    On: 09/28/2017 19:32      Lab Results  Component Value Date   HGBA1C 6.2 (H) 09/05/2017   HGBA1C 6.1 (H) 09/02/2017   HGBA1C 4.5 (L) 07/08/2015   Lab Results  Component Value Date   LDLCALC 94 06/19/2015   CREATININE 2.64 (H) 09/30/2017       Scheduled Meds: . amLODipine  10 mg Oral Daily  . chlorhexidine  15 mL Mouth/Throat BID  . cholecalciferol  400 Units Oral Daily  . diltiazem  120 mg Oral Q8H  . famotidine  20 mg Oral QHS  . feeding supplement (GLUCERNA SHAKE)  237 mL Oral TID BM  . ferrous sulfate  325 mg Oral BID PC  . fluticasone  1 spray Each Nare Daily  . heparin injection (subcutaneous)  5,000 Units Subcutaneous Q8H  . hydrALAZINE  100 mg Oral TID  . lactulose  20 g Oral BID  . levothyroxine  224 mcg Oral QAC breakfast  .  morphine injection  2 mg Intravenous Once  . multivitamin with minerals  1 tablet Oral Daily  .  potassium chloride  40 mEq Oral Once  . sodium chloride flush  10-40 mL Intracatheter Q12H  . vitamin B-12  500 mcg Oral Daily   Continuous Infusions: . piperacillin-tazobactam (ZOSYN)  IV Stopped (09/30/17 0327)     LOS: 3 days    Time spent: >30 MINS    [facility]  [facility] Pager [phone]. If 7PM-7AM, please contact night-coverage at [email or web address], password [facility] 09/30/2017, 7:32 AM  LOS: 3 days

## 2017-09-30 NOTE — Progress Notes (Signed)
Pharmacy Antibiotic Note  Emily Kennedy is a 72 y.o. female with hx lymphoma currently on chemo per [name] (last chemo cycle on 6/18), admitted on 09/27/2017 with hypertensive crisis and AKI; also noted to have asymptomatic pyuria. Pharmacy has been consulted for Zosyn dosing. She was neutropenic on admission.  Today, 09/30/2017: -  afeb, wbc low - scr  trending down 2.64 (crcl~21) -  ANC up 0.8 on 7/18   Plan: - Per Dr. Jonelle Sidle, to continue with current abx for now. - With improving renal function, will adjust zosyn dose to 3.375 gm IV q8h (infuse over 4 hrs) - f/u cultures and recommendations from heme/onc ______________________________________  Height: 5\' 4"  (162.6 cm) Weight: 193 lb 12.6 oz (87.9 kg) IBW/kg (Calculated) : 54.7  Temp (24hrs), Avg:98.1 F (36.7 C), Min:97.8 F (36.6 C), Max:98.3 F (36.8 C)  Recent Labs  Lab 09/27/17 0807 09/27/17 1300 09/28/17 0529 09/29/17 0550 09/29/17 1256 09/30/17 0554  WBC 1.3* 1.2* 1.1* 1.6* 1.7*  --   CREATININE 6.03* 6.29* 4.81* 3.68*  --  2.64*    Estimated Creatinine Clearance: 21.3 mL/min (A) (by C-G formula based on SCr of 2.64 mg/dL (H)).    Allergies  Allergen Reactions  . Benadryl [Diphenhydramine] Swelling  . Ciprofloxacin Hives  . Furosemide Itching, Swelling, Rash and Other (See Comments)    Made edema worse  . Nitrofurantoin Monohyd Macro Hives  . Septra [Bactrim] Hives  . Sulfa Drugs Cross Reactors Hives  . Allopurinol Itching  . Ceclor [Cefaclor] Rash    Breaks out in rash chest down.    Antimicrobials this admission:  7/16 zosyn>>  Microbiology results:  7/16 UCx: neg FINAL 7/16 UA: rare bact, moderate leuk  Thank you for allowing pharmacy to be a part of this patient's care.  Lynelle Doctor 09/30/2017 11:25 AM

## 2017-10-01 LAB — COMPREHENSIVE METABOLIC PANEL
ALK PHOS: 57 U/L (ref 38–126)
ALT: 10 U/L (ref 0–44)
ANION GAP: 7 (ref 5–15)
AST: 13 U/L — ABNORMAL LOW (ref 15–41)
Albumin: 2.1 g/dL — ABNORMAL LOW (ref 3.5–5.0)
BUN: 40 mg/dL — ABNORMAL HIGH (ref 8–23)
CALCIUM: 7.9 mg/dL — AB (ref 8.9–10.3)
CO2: 22 mmol/L (ref 22–32)
Chloride: 108 mmol/L (ref 98–111)
Creatinine, Ser: 2.08 mg/dL — ABNORMAL HIGH (ref 0.44–1.00)
GFR calc non Af Amer: 23 mL/min — ABNORMAL LOW (ref >60)
GFR, EST AFRICAN AMERICAN: 27 mL/min — AB (ref >60)
Glucose, Bld: 111 mg/dL — ABNORMAL HIGH (ref 70–99)
Potassium: 3.4 mmol/L — ABNORMAL LOW (ref 3.5–5.1)
SODIUM: 137 mmol/L (ref 135–145)
Total Bilirubin: 0.5 mg/dL (ref 0.3–1.2)
Total Protein: 4.5 g/dL — ABNORMAL LOW (ref 6.5–8.1)

## 2017-10-01 LAB — CBC WITH DIFFERENTIAL/PLATELET
BASOS PCT: 0 %
Basophils Absolute: 0 10*3/uL (ref 0.0–0.1)
EOS ABS: 0.2 10*3/uL (ref 0.0–0.7)
Eosinophils Relative: 10 %
HCT: 24.7 % — ABNORMAL LOW (ref 36.0–46.0)
Hemoglobin: 8.5 g/dL — ABNORMAL LOW (ref 12.0–15.0)
LYMPHS PCT: 20 %
Lymphs Abs: 0.5 10*3/uL — ABNORMAL LOW (ref 0.7–4.0)
MCH: 28.3 pg (ref 26.0–34.0)
MCHC: 34.4 g/dL (ref 30.0–36.0)
MCV: 82.3 fL (ref 78.0–100.0)
Monocytes Absolute: 0.5 10*3/uL (ref 0.1–1.0)
Monocytes Relative: 20 %
NEUTROS PCT: 50 %
Neutro Abs: 1.1 10*3/uL — ABNORMAL LOW (ref 1.7–7.7)
PLATELETS: 132 10*3/uL — AB (ref 150–400)
RBC: 3 MIL/uL — ABNORMAL LOW (ref 3.87–5.11)
RDW: 14.8 % (ref 11.5–15.5)
WBC: 2.3 10*3/uL — ABNORMAL LOW (ref 4.0–10.5)

## 2017-10-01 MED ORDER — MUSCLE RUB 10-15 % EX CREA
1.0000 "application " | TOPICAL_CREAM | CUTANEOUS | Status: DC | PRN
  Filled 2017-10-01: qty 85

## 2017-10-01 MED ORDER — PROMETHAZINE HCL 25 MG/ML IJ SOLN
12.5000 mg | Freq: Four times a day (QID) | INTRAMUSCULAR | Status: DC | PRN
  Administered 2017-10-01: 12.5 mg via INTRAVENOUS
  Filled 2017-10-01: qty 1

## 2017-10-01 MED ORDER — ONDANSETRON HCL 4 MG/2ML IJ SOLN
4.0000 mg | Freq: Four times a day (QID) | INTRAMUSCULAR | Status: DC | PRN
  Administered 2017-10-01 – 2017-10-04 (×2): 4 mg via INTRAVENOUS
  Filled 2017-10-01 (×2): qty 2

## 2017-10-01 NOTE — Progress Notes (Signed)
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   Assessment & Plan:   Principal Problem:   ARF (acute renal failure) (HCC) Active Problems:   Pancytopenia ([place])   Acute lower UTI   #) AKI on stage III CKD: Unclear etiology however most likely appears to be prerenal possibly secondary to diuresis.  Patient was noted to have right-sided hydronephrosis but this was fairly mild and otherwise ultrasound showed medical renal disease.  Her creatinine appears to be downtrending nicely. 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  Pancytopenia is improving or stable -Outpatient follow-up  #) Hypertension: - Continue amlodipine 10 mg daily -Continue diltiazem 120 mg every 8 hours -Continue hydralazine 100 mg 3 times daily  #) Hypothyroidism: -Continue levothyroxine 224 mcg every morning  #) Pain/psych: - Continue PRN oxycodone  Fluids: Gentle IV fluids Electrodes: Monitor and supplement Nutrition: Heart healthy diet  Prophylaxis: Subcu heparin  Disposition: Pending resolution of AKI and likely skilled nursing facility placement  DNR  Consultants:   None  Procedures:  None  Antimicrobials:  None   Subjective: Patient reports she is doing fairly well.  She does not have any complaints.  She denies any nausea, vomiting,  diarrhea.  Objective: Vitals:   09/30/17 2039 10/01/17 0523 10/01/17 0951 10/01/17 1320  BP: (!) 144/67 (!) 153/76 (!) 149/66 (!) 142/70  Pulse: 88 91 85 81  Resp: 18 18  14   Temp: (!) 97.5 F (36.4 C) 98.4 F (36.9 C)  98.4 F (36.9 C)  TempSrc: Oral Oral  Oral  SpO2: 97% 96% 99% 99%  Weight:      Height:        Intake/Output Summary (Last 24 hours) at 10/01/2017 1356 Last data filed at 10/01/2017 0839 Gross per 24 hour  Intake 139.38 ml  Output 1050 ml  Net -910.62 ml   Filed Weights   09/27/17 1008 09/27/17 2006 09/30/17 0500  Weight: 88 kg (194 lb) 85 kg (187 lb 6.3 oz) 87.9 kg (193 lb 12.6 oz)    Examination:  General exam: No acute distress Respiratory system: Clear to auscultation. Respiratory effort normal. Cardiovascular system: Regular rate and rhythm, no murmurs Gastrointestinal system: Abdomen is nondistended, soft and nontender. No organomegaly or masses felt. Normal bowel sounds heard. Central nervous system: Alert and oriented. No focal neurological deficits. Extremities: Lower extremity edema Skin: No rashes visible skin Psychiatry: Judgement and insight appear normal. Mood & affect appropriate.     Data Reviewed: I have personally reviewed following labs and imaging studies  CBC: Recent Labs  Lab 09/27/17 0807 09/27/17 1300 09/28/17 0529 09/29/17 0550 09/29/17 1256 10/01/17 0332  WBC 1.3* 1.2* 1.1* 1.6* 1.7* 2.3*  NEUTROABS 0.5* 0.4*  --  0.6* 0.8* 1.1*  HGB 9.5* 8.0* 8.2* 8.2* 8.8* 8.5*  HCT 27.6* 23.2* 24.3* 24.3* 25.6* 24.7*  MCV 81.4 81.7 82.7 82.1 82.1 82.3  PLT 118* 122* 123* 147* 155 030*   Basic Metabolic Panel: Recent Labs  Lab 09/27/17 0807 09/27/17 1300 09/28/17 0529 09/29/17  0550 09/30/17 0554 10/01/17 0332  NA 130* 131* 133* 133* 135 137  K 4.7 4.6 3.9 3.6 3.1* 3.4*  CL 99 100 103 105 105 108  CO2 17* 19* 18* 19* 21* 22  GLUCOSE 124* 120* 115* 125* 124* 111*  BUN 67* 69* 60* 54* 46* 40*  CREATININE 6.03* 6.29*  4.81* 3.68* 2.64* 2.08*  CALCIUM 8.2* 7.8* 7.8* 7.6* 7.8* 7.9*  MG 2.3  --   --   --   --   --   PHOS  --   --   --   --  4.0  --    GFR: Estimated Creatinine Clearance: 27 mL/min (A) (by C-G formula based on SCr of 2.08 mg/dL (H)). Liver Function Tests: Recent Labs  Lab 09/27/17 0807 09/27/17 1300 09/28/17 0529 09/30/17 0554 10/01/17 0332  AST 13* 13* 11* 12* 13*  ALT 8 10 9 9 10   ALKPHOS 89 70 63 52 57  BILITOT 0.4 0.6 0.4 0.4 0.5  PROT 4.9* 4.6* 4.0* 4.5* 4.5*  ALBUMIN 2.0* 1.9* 1.6* 2.4* 2.1*   No results for input(s): LIPASE, AMYLASE in the last 168 hours. No results for input(s): AMMONIA in the last 168 hours. Coagulation Profile: Recent Labs  Lab 09/27/17 1300  INR 1.19   Cardiac Enzymes: No results for input(s): CKTOTAL, CKMB, CKMBINDEX, TROPONINI in the last 168 hours. BNP (last 3 results) No results for input(s): PROBNP in the last 8760 hours. HbA1C: No results for input(s): HGBA1C in the last 72 hours. CBG: Recent Labs  Lab 09/28/17 2107  GLUCAP 140*   Lipid Profile: No results for input(s): CHOL, HDL, LDLCALC, TRIG, CHOLHDL, LDLDIRECT in the last 72 hours. Thyroid Function Tests: No results for input(s): TSH, T4TOTAL, FREET4, T3FREE, THYROIDAB in the last 72 hours. Anemia Panel: No results for input(s): VITAMINB12, FOLATE, FERRITIN, TIBC, IRON, RETICCTPCT in the last 72 hours. Sepsis Labs: No results for input(s): PROCALCITON, LATICACIDVEN in the last 168 hours.  Recent Results (from the past 240 hour(s))  Urine culture     Status: None   Collection Time: 09/27/17  2:03 PM  Result Value Ref Range Status   Specimen Description   Final    URINE, CLEAN CATCH Performed at [facility], [place] [address], [place], [place] [address]    Special Requests   Final    Normal Performed at [facility], [place] [address], [place], [place] [address]    Culture   Final    NO GROWTH Performed at [facility], [place] [address], [place], [place] [address]    Report Status 09/29/2017 FINAL  Final         Radiology Studies: No results found.      Scheduled Meds: . amLODipine  10 mg Oral Daily  . chlorhexidine  15 mL Mouth/Throat BID  . cholecalciferol  400 Units Oral Daily  . diltiazem  120 mg Oral Q8H  . famotidine  20 mg Oral QHS  . feeding supplement (GLUCERNA SHAKE)  237 mL Oral TID BM  . ferrous sulfate  325 mg Oral BID PC  . fluticasone  1 spray Each Nare Daily  . heparin injection (subcutaneous)  5,000 Units Subcutaneous Q8H  . hydrALAZINE  100 mg Oral TID  . lactulose  20 g Oral BID  . levothyroxine  224 mcg Oral QAC breakfast  .  morphine injection  2 mg Intravenous Once  . multivitamin with minerals  1 tablet Oral Daily  .  sodium chloride flush  10-40 mL Intracatheter Q12H  . vitamin B-12  500 mcg Oral Daily   Continuous Infusions:   LOS: 4 days    Time spent: [place], MD [facility]  If 7PM-7AM, please contact night-coverage [email or web address] Password [email or web address] 10/01/2017, 1:56 PM

## 2017-10-02 LAB — COMPREHENSIVE METABOLIC PANEL
ALT: 10 U/L (ref 0–44)
AST: 14 U/L — AB (ref 15–41)
Albumin: 1.9 g/dL — ABNORMAL LOW (ref 3.5–5.0)
Alkaline Phosphatase: 61 U/L (ref 38–126)
Anion gap: 5 (ref 5–15)
BUN: 36 mg/dL — AB (ref 8–23)
CHLORIDE: 108 mmol/L (ref 98–111)
CO2: 23 mmol/L (ref 22–32)
Calcium: 7.7 mg/dL — ABNORMAL LOW (ref 8.9–10.3)
Creatinine, Ser: 1.65 mg/dL — ABNORMAL HIGH (ref 0.44–1.00)
GFR calc Af Amer: 35 mL/min — ABNORMAL LOW (ref >60)
GFR calc non Af Amer: 30 mL/min — ABNORMAL LOW (ref >60)
GLUCOSE: 115 mg/dL — AB (ref 70–99)
Potassium: 3.4 mmol/L — ABNORMAL LOW (ref 3.5–5.1)
SODIUM: 136 mmol/L (ref 135–145)
Total Bilirubin: 0.3 mg/dL (ref 0.3–1.2)
Total Protein: 4.1 g/dL — ABNORMAL LOW (ref 6.5–8.1)

## 2017-10-02 LAB — MAGNESIUM: Magnesium: 1.9 mg/dL (ref 1.7–2.4)

## 2017-10-02 LAB — CBC
HCT: 23 % — ABNORMAL LOW (ref 36.0–46.0)
Hemoglobin: 7.8 g/dL — ABNORMAL LOW (ref 12.0–15.0)
MCH: 28.1 pg (ref 26.0–34.0)
MCHC: 33.9 g/dL (ref 30.0–36.0)
MCV: 82.7 fL (ref 78.0–100.0)
Platelets: 104 10*3/uL — ABNORMAL LOW (ref 150–400)
RBC: 2.78 MIL/uL — ABNORMAL LOW (ref 3.87–5.11)
RDW: 14.7 % (ref 11.5–15.5)
WBC: 2.7 10*3/uL — ABNORMAL LOW (ref 4.0–10.5)

## 2017-10-02 MED ORDER — POTASSIUM CHLORIDE CRYS ER 20 MEQ PO TBCR: 40.0000 meq | EXTENDED_RELEASE_TABLET | Freq: Once | ORAL | Status: DC

## 2017-10-02 MED ORDER — POTASSIUM CHLORIDE CRYS ER 20 MEQ PO TBCR
40.0000 meq | EXTENDED_RELEASE_TABLET | Freq: Once | ORAL | Status: AC
  Administered 2017-10-02: 40 meq via ORAL
  Filled 2017-10-02: qty 2

## 2017-10-02 NOTE — Progress Notes (Signed)
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  Pancytopenia is improving or stable -Outpatient follow-up  #) Hypertension: - Continue amlodipine 10 mg daily -Continue diltiazem 120 mg every 8 hours -Continue hydralazine 100 mg 3 times daily  #) Hypothyroidism: -Continue levothyroxine 224 mcg every morning  #) Pain/psych: - Continue PRN oxycodone  Fluids: Gentle IV fluids Electrodes: Monitor and supplement Nutrition: Heart healthy diet  Prophylaxis: Subcu heparin  Disposition: Pending resolution of AKI and  skilled nursing facility placement  DNR  Consultants:   None  Procedures:  None  Antimicrobials:  None   Subjective: Patient reports she is doing fairly well.  She does not have any complaints.  She denies any nausea, vomiting, diarrhea.  Objective: Vitals:   10/01/17 1525 10/01/17 1525 10/01/17 2014 10/02/17 0506  BP: (!) 153/70 (!) 153/70 (!) 153/69 (!) 150/70  Pulse:  93 100 83  Resp:   18 20  Temp:   98.5 F (36.9 C) 98.4 F (36.9 C)  TempSrc:   Oral Oral  SpO2:   98% 98%  Weight:     83.9 kg (184 lb 15.5 oz)  Height:        Intake/Output Summary (Last 24 hours) at 10/02/2017 1112 Last data filed at 10/02/2017 0513 Gross per 24 hour  Intake -  Output 1250 ml  Net -1250 ml   Filed Weights   09/27/17 2006 09/30/17 0500 10/02/17 0506  Weight: 85 kg (187 lb 6.3 oz) 87.9 kg (193 lb 12.6 oz) 83.9 kg (184 lb 15.5 oz)    Examination:  General exam: No acute distress Respiratory system: Clear to auscultation. Respiratory effort normal. Cardiovascular system: Regular rate and rhythm, no murmurs Gastrointestinal system: Abdomen is nondistended, soft and nontender. No organomegaly or masses felt. Normal bowel sounds heard. Central nervous system: Alert and oriented. No focal neurological deficits. Extremities: Lower extremity edema Skin: No rashes visible skin, port site is clean dry and intact Psychiatry: Judgement and insight appear normal. Mood & affect appropriate.     Data Reviewed: I have personally reviewed following labs and imaging studies  CBC: Recent Labs  Lab 09/27/17 0807 09/27/17 1300 09/28/17 0529 09/29/17 0550 09/29/17 1256 10/01/17 0332 10/02/17 0507  WBC 1.3* 1.2* 1.1* 1.6* 1.7* 2.3* 2.7*  NEUTROABS 0.5* 0.4*  --  0.6* 0.8* 1.1*  --   HGB 9.5* 8.0* 8.2* 8.2* 8.8* 8.5* 7.8*  HCT 27.6* 23.2* 24.3* 24.3* 25.6* 24.7* 23.0*  MCV 81.4 81.7 82.7 82.1 82.1 82.3 82.7  PLT 118* 122* 123* 147* 155 132* 412*   Basic Metabolic Panel: Recent Labs  Lab 09/27/17 0807  09/28/17 0529 09/29/17 0550 09/30/17 0554 10/01/17 0332 10/02/17 0507  NA 130*   < > 133* 133* 135 137 136  K 4.7   < > 3.9 3.6 3.1* 3.4* 3.4*  CL 99   < > 103 105 105 108 108  CO2 17*   < > 18* 19* 21* 22 23  GLUCOSE 124*   < > 115* 125* 124* 111* 115*  BUN 67*   < > 60* 54* 46* 40* 36*  CREATININE 6.03*   < > 4.81* 3.68* 2.64* 2.08* 1.65*  CALCIUM 8.2*   < > 7.8* 7.6* 7.8* 7.9* 7.7*  MG 2.3  --   --   --   --   --  1.9  PHOS  --   --   --   --  4.0  --   --    < > = values  in this interval not displayed.   GFR: Estimated Creatinine Clearance: 33.3 mL/min (A) (by C-G formula based on SCr of 1.65 mg/dL (H)). Liver Function Tests: Recent Labs  Lab 09/27/17 1300 09/28/17 0529 09/30/17 0554 10/01/17 0332 10/02/17 0507  AST 13* 11* 12* 13* 14*  ALT 10 9 9 10 10   ALKPHOS 70 63 52 57 61  BILITOT 0.6 0.4 0.4 0.5 0.3  PROT 4.6* 4.0* 4.5* 4.5* 4.1*  ALBUMIN 1.9* 1.6* 2.4* 2.1* 1.9*   No results for input(s): LIPASE, AMYLASE in the last 168 hours. No results for input(s): AMMONIA in the last 168 hours. Coagulation Profile: Recent Labs  Lab 09/27/17 1300  INR 1.19   Cardiac Enzymes: No results for input(s): CKTOTAL, CKMB, CKMBINDEX, TROPONINI in the last 168 hours. BNP (last 3 results) No results for input(s): PROBNP in the last 8760 hours. HbA1C: No results for input(s): HGBA1C in the last 72 hours. CBG: Recent Labs  Lab 09/28/17 2107  GLUCAP 140*   Lipid Profile: No results for input(s): CHOL, HDL, LDLCALC, TRIG, CHOLHDL, LDLDIRECT in the last 72 hours. Thyroid Function Tests: No results for input(s): TSH, T4TOTAL, FREET4, T3FREE, THYROIDAB in the last 72 hours. Anemia Panel: No results for input(s): VITAMINB12, FOLATE, FERRITIN, TIBC, IRON, RETICCTPCT in the last 72 hours. Sepsis Labs: No results for input(s): PROCALCITON, LATICACIDVEN in the last 168 hours.  Recent Results (from the past 240 hour(s))  Urine culture     Status: None   Collection Time: 09/27/17  2:03 PM  Result Value Ref Range Status   Specimen Description   Final    URINE, CLEAN CATCH Performed at [facility], [place] [address], [place], [place] [address]    Special Requests   Final    Normal Performed at [facility], [place] [address] [place], [place] [address]    Culture   Final    NO GROWTH Performed at [facility], [place] [address], [place], [place] [address]    Report Status 09/29/2017 FINAL  Final          Radiology Studies: No results found.      Scheduled Meds: . amLODipine  10 mg Oral Daily  . chlorhexidine  15 mL Mouth/Throat BID  . cholecalciferol  400 Units Oral Daily  . diltiazem  120 mg Oral Q8H  . famotidine  20 mg Oral QHS  . feeding supplement (GLUCERNA SHAKE)  237 mL Oral TID BM  . ferrous sulfate  325 mg Oral BID PC  . fluticasone  1 spray Each Nare Daily  . heparin injection (subcutaneous)  5,000 Units Subcutaneous Q8H  . hydrALAZINE  100 mg Oral TID  . lactulose  20 g Oral BID  .  levothyroxine  224 mcg Oral QAC breakfast  .  morphine injection  2 mg Intravenous Once  . multivitamin with minerals  1 tablet Oral Daily  . sodium chloride flush  10-40 mL Intracatheter Q12H  . vitamin B-12  500 mcg Oral Daily   Continuous Infusions:   LOS: 5 days    Time spent: [place], MD [facility]  If 7PM-7AM, please contact night-coverage [email or web address] Password [email or web address] 10/02/2017, 11:12 AM

## 2017-10-03 LAB — COMPREHENSIVE METABOLIC PANEL
ALBUMIN: 1.9 g/dL — AB (ref 3.5–5.0)
ALT: 9 U/L (ref 0–44)
AST: 14 U/L — AB (ref 15–41)
Alkaline Phosphatase: 55 U/L (ref 38–126)
Anion gap: 5 (ref 5–15)
BUN: 35 mg/dL — ABNORMAL HIGH (ref 8–23)
CALCIUM: 7.5 mg/dL — AB (ref 8.9–10.3)
CHLORIDE: 105 mmol/L (ref 98–111)
CO2: 22 mmol/L (ref 22–32)
CREATININE: 1.3 mg/dL — AB (ref 0.44–1.00)
GFR calc Af Amer: 47 mL/min — ABNORMAL LOW (ref >60)
GFR calc non Af Amer: 41 mL/min — ABNORMAL LOW (ref >60)
GLUCOSE: 113 mg/dL — AB (ref 70–99)
Potassium: 3.7 mmol/L (ref 3.5–5.1)
SODIUM: 132 mmol/L — AB (ref 135–145)
Total Bilirubin: 0.5 mg/dL (ref 0.3–1.2)
Total Protein: 4.1 g/dL — ABNORMAL LOW (ref 6.5–8.1)

## 2017-10-03 LAB — CBC
HCT: 22.9 % — ABNORMAL LOW (ref 36.0–46.0)
Hemoglobin: 7.7 g/dL — ABNORMAL LOW (ref 12.0–15.0)
MCH: 28 pg (ref 26.0–34.0)
MCHC: 33.6 g/dL (ref 30.0–36.0)
MCV: 83.3 fL (ref 78.0–100.0)
Platelets: 103 K/uL — ABNORMAL LOW (ref 150–400)
RBC: 2.75 MIL/uL — ABNORMAL LOW (ref 3.87–5.11)
RDW: 15 % (ref 11.5–15.5)
WBC: 2.6 10*3/uL — ABNORMAL LOW (ref 4.0–10.5)

## 2017-10-03 LAB — GLUCOSE, CAPILLARY: Glucose-Capillary: 132 mg/dL — ABNORMAL HIGH (ref 70–99)

## 2017-10-03 NOTE — Progress Notes (Signed)
Physical Therapy Treatment Patient Details Name: Emily Kennedy MRN: [id number] DOB: 1945-04-02 Today's Date: 10/03/2017    History of Present Illness 72 y.o. female with medical history significant of HTN, HLD, CHF, DM type II, vasculitis, and malignant lymphoma currently receiving chemotherapy followed by [name] of oncology and admitted for acute renal failure    PT Comments    Pt participated in bed mobility and LE exercises on today.    Follow Up Recommendations  SNF     Equipment Recommendations  None recommended by PT    Recommendations for Other Services       Precautions / Restrictions Precautions Precautions: Fall Restrictions Weight Bearing Restrictions: No    Mobility  Bed Mobility Overal bed mobility: Needs Assistance Bed Mobility: Supine to Sit;Sit to Supine     Supine to sit: Max assist Sit to supine: Max assist   General bed mobility comments: Assist for bil LEs and trunk. Utilized bedpad for scooting, positioning. Pt sat EOB for ~45 seconds with Mod assist for balance.   Transfers                 General transfer comment: NT  Ambulation/Gait                 Stairs             Wheelchair Mobility    Modified Rankin (Stroke Patients Only)       Balance                                            Cognition Arousal/Alertness: Awake/alert Behavior During Therapy: Flat affect Overall Cognitive Status: No family/caregiver present to determine baseline cognitive functioning                                        Exercises General Exercises - Lower Extremity Ankle Circles/Pumps: AROM;Both;10 reps;Supine Quad Sets: AROM;Both;10 reps;Supine Heel Slides: AAROM;Right;Left;10 reps;Supine Hip ABduction/ADduction: AAROM;Right;Left;10 reps;Supine    General Comments        Pertinent Vitals/Pain Pain Assessment: Faces Faces Pain Scale: Hurts little more Pain Location: back with  sitting EOB Pain Descriptors / Indicators: Sore Pain Intervention(s): Limited activity within patient's tolerance;Repositioned    Home Living                      Prior Function            PT Goals (current goals can now be found in the care plan section) Progress towards PT goals: Progressing toward goals    Frequency    Min 2X/week      PT Plan Current plan remains appropriate    Co-evaluation              AM-PAC PT "6 Clicks" Daily Activity  Outcome Measure  Difficulty turning over in bed (including adjusting bedclothes, sheets and blankets)?: Unable Difficulty moving from lying on back to sitting on the side of the bed? : Unable Difficulty sitting down on and standing up from a chair with arms (e.g., wheelchair, bedside commode, etc,.)?: Unable Help needed moving to and from a bed to chair (including a wheelchair)?: Total Help needed walking in hospital room?: Total Help needed climbing 3-5 steps with a railing? : Total 6 Click Score:  6    End of Session Equipment Utilized During Treatment: Oxygen Activity Tolerance: Patient limited by fatigue Patient left: in bed;with call bell/phone within reach;with bed alarm set   PT Visit Diagnosis: Other abnormalities of gait and mobility (R26.89);Muscle weakness (generalized) (M62.81);Difficulty in walking, not elsewhere classified (R26.2)     Time: 7867-5449 PT Time Calculation (min) (ACUTE ONLY): 13 min  Charges:  $Therapeutic Activity: 8-22 mins                    G Codes:          Weston Anna, MPT Pager: [phone]

## 2017-10-03 NOTE — Progress Notes (Signed)
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   Assessment & Plan:   Principal Problem:   ARF (acute renal failure) (HCC) Active Problems:   Pancytopenia ([place])   Acute lower UTI   #) AKI on stage III CKD: Almost completely resolved - Hold nephrotoxins -Discontinue torsemide on discharge  #) myelodysplastic syndrome and lymphoplasmacytic lymphoma complicated by pancytopenia: Patient's neutropenia has resolved.  Pancytopenia is improving or stable -Outpatient follow-up  #) Hypertension: - Continue amlodipine 10 mg daily -Continue diltiazem 120 mg every 8 hours -Continue hydralazine 100 mg 3 times daily  #) Hypothyroidism: -Continue levothyroxine 224 mcg every morning  #) Pain/psych: - Continue PRN oxycodone  Fluids: Gentle IV fluids Electrodes: Monitor and supplement Nutrition: Heart healthy diet  Prophylaxis: Subcu heparin  Disposition: Pending resolution of AKI and  skilled nursing facility placement  DNR  Consultants:   None  Procedures:  None  Antimicrobials:  None   Subjective: Patient reports she is doing fairly well.  She does not have any complaints.  She denies any nausea, vomiting, diarrhea.    Objective: Vitals:   10/02/17 0506 10/02/17 1412 10/02/17 2159 10/03/17 0523  BP: (!) 150/70 136/64 (!) 150/77 140/69  Pulse: 83 91 95 80  Resp: 20 18 18 18   Temp: 98.4 F (36.9 C) 98.3 F (36.8 C) 98.7 F (37.1 C) 97.8 F (36.6 C)  TempSrc:  Oral Oral    SpO2: 98% 99% 95% 99%  Weight: 83.9 kg (184 lb 15.5 oz)   84.4 kg (186 lb 1.1 oz)  Height:        Intake/Output Summary (Last 24 hours) at 10/03/2017 1240 Last data filed at 10/03/2017 0500 Gross per 24 hour  Intake 440 ml  Output 1250 ml  Net -810 ml   Filed Weights   09/30/17 0500 10/02/17 0506 10/03/17 0523  Weight: 87.9 kg (193 lb 12.6 oz) 83.9 kg (184 lb 15.5 oz) 84.4 kg (186 lb 1.1 oz)    Examination:  General exam: No acute distress Respiratory system: Clear to auscultation. Respiratory effort normal. Cardiovascular system: Regular rate and rhythm, no murmurs Gastrointestinal system: Abdomen is nondistended, soft and nontender. No organomegaly or masses felt. Normal bowel sounds heard. Central nervous system: Alert and oriented. No focal neurological deficits. Extremities: Lower extremity edema Skin: No rashes visible skin, port site is clean dry and intact Psychiatry: Judgement and insight appear normal. Mood & affect appropriate.     Data Reviewed: I have personally reviewed following labs and imaging studies  CBC: Recent Labs  Lab 09/27/17 0807 09/27/17 1300  09/29/17 0550 09/29/17 1256 10/01/17 0332 10/02/17 0507 10/03/17 0500  WBC 1.3* 1.2*   < > 1.6* 1.7* 2.3* 2.7* 2.6*  NEUTROABS 0.5* 0.4*  --  0.6* 0.8* 1.1*  --   --   HGB 9.5* 8.0*   < > 8.2* 8.8* 8.5* 7.8* 7.7*  HCT 27.6* 23.2*   < > 24.3* 25.6* 24.7* 23.0* 22.9*  MCV 81.4 81.7   < > 82.1 82.1 82.3 82.7 83.3  PLT 118* 122*   < > 147* 155 132* 104*  103*   < > = values in this interval not displayed.   Basic Metabolic Panel: Recent Labs  Lab 09/27/17 0807  09/29/17 0550 09/30/17 0554 10/01/17 0332 10/02/17 0507 10/03/17 0500  NA 130*   < > 133* 135 137 136 132*  K 4.7   < > 3.6 3.1* 3.4* 3.4* 3.7  CL 99   < > 105 105 108 108 105  CO2 17*   < > 19* 21* 22 23 22   GLUCOSE 124*   < > 125* 124* 111* 115* 113*  BUN 67*   < > 54* 46* 40* 36* 35*  CREATININE 6.03*   < > 3.68* 2.64*  2.08* 1.65* 1.30*  CALCIUM 8.2*   < > 7.6* 7.8* 7.9* 7.7* 7.5*  MG 2.3  --   --   --   --  1.9  --   PHOS  --   --   --  4.0  --   --   --    < > = values in this interval not displayed.   GFR: Estimated Creatinine Clearance: 42.3 mL/min (A) (by C-G formula based on SCr of 1.3 mg/dL (H)). Liver Function Tests: Recent Labs  Lab 09/28/17 0529 09/30/17 0554 10/01/17 0332 10/02/17 0507 10/03/17 0500  AST 11* 12* 13* 14* 14*  ALT 9 9 10 10 9   ALKPHOS 63 52 57 61 55  BILITOT 0.4 0.4 0.5 0.3 0.5  PROT 4.0* 4.5* 4.5* 4.1* 4.1*  ALBUMIN 1.6* 2.4* 2.1* 1.9* 1.9*   No results for input(s): LIPASE, AMYLASE in the last 168 hours. No results for input(s): AMMONIA in the last 168 hours. Coagulation Profile: Recent Labs  Lab 09/27/17 1300  INR 1.19   Cardiac Enzymes: No results for input(s): CKTOTAL, CKMB, CKMBINDEX, TROPONINI in the last 168 hours. BNP (last 3 results) No results for input(s): PROBNP in the last 8760 hours. HbA1C: No results for input(s): HGBA1C in the last 72 hours. CBG: Recent Labs  Lab 09/28/17 2107 10/02/17 2237  GLUCAP 140* 132*   Lipid Profile: No results for input(s): CHOL, HDL, LDLCALC, TRIG, CHOLHDL, LDLDIRECT in the last 72 hours. Thyroid Function Tests: No results for input(s): TSH, T4TOTAL, FREET4, T3FREE, THYROIDAB in the last 72 hours. Anemia Panel: No results for input(s): VITAMINB12, FOLATE, FERRITIN, TIBC, IRON, RETICCTPCT in the last 72 hours. Sepsis Labs: No results for input(s): PROCALCITON, LATICACIDVEN in the last 168 hours.  Recent Results (from the past 240 hour(s))  Urine culture     Status: None   Collection Time: 09/27/17  2:03 PM  Result Value Ref Range Status   Specimen Description   Final    URINE, CLEAN CATCH Performed at [facility], [place] [address] [place], [place] [address]    Special Requests   Final    Normal Performed at [facility], [place] [address], [place], [place]  [address]    Culture   Final    NO GROWTH Performed at [facility], [place] [address], [place], [place] [address]    Report Status 09/29/2017 FINAL  Final         Radiology Studies: No results found.      Scheduled Meds: . amLODipine  10 mg Oral Daily  . chlorhexidine  15 mL Mouth/Throat BID  . cholecalciferol  400 Units Oral Daily  . diltiazem  120 mg Oral Q8H  . famotidine  20 mg Oral QHS  . feeding supplement (GLUCERNA SHAKE)  237 mL  Oral TID BM  . ferrous sulfate  325 mg Oral BID PC  . fluticasone  1 spray Each Nare Daily  . heparin injection (subcutaneous)  5,000 Units Subcutaneous Q8H  . hydrALAZINE  100 mg Oral TID  . lactulose  20 g Oral BID  . levothyroxine  224 mcg Oral QAC breakfast  .  morphine injection  2 mg Intravenous Once  . multivitamin with minerals  1 tablet Oral Daily  . sodium chloride flush  10-40 mL Intracatheter Q12H  . vitamin B-12  500 mcg Oral Daily   Continuous Infusions:   LOS: 6 days    Time spent: [place], MD [facility]  If 7PM-7AM, please contact night-coverage [email or web address] Password [facility] 10/03/2017, 12:40 PM

## 2017-10-03 NOTE — Care Management Important Message (Signed)
Important Message  Patient Details  Name: Necha Harries MRN: [id number] Date of Birth: [birth year]   Medicare Important Message Given:  Yes    Kerin Salen 10/03/2017, 1:04 [place] Message  Patient Details  Name: Terri Rorrer MRN: [id number] Date of Birth: [birth year]   Medicare Important Message Given:  Yes    Kerin Salen 10/03/2017, 1:04 PM

## 2017-10-03 NOTE — Progress Notes (Signed)
CSW following [facility].  Patient currently not medically stable, CSW updated [facility] SNF confirmed bed offer. CSW will assist with discharge to SNF when patient is medically stable. CSW will continue to follow and assist with discharge planning.  Abundio Miu, [place] Social Worker [facility] Cell#: [phone]

## 2017-10-04 ENCOUNTER — Telehealth: Payer: Self-pay

## 2017-10-04 DIAGNOSIS — E440 Moderate protein-calorie malnutrition: Secondary | ICD-10-CM

## 2017-10-04 DIAGNOSIS — C8300 Small cell B-cell lymphoma, unspecified site: Secondary | ICD-10-CM

## 2017-10-04 DIAGNOSIS — D469 Myelodysplastic syndrome, unspecified: Secondary | ICD-10-CM

## 2017-10-04 DIAGNOSIS — I10 Essential (primary) hypertension: Secondary | ICD-10-CM

## 2017-10-04 DIAGNOSIS — K5900 Constipation, unspecified: Secondary | ICD-10-CM | POA: Diagnosis present

## 2017-10-04 DIAGNOSIS — E038 Other specified hypothyroidism: Secondary | ICD-10-CM

## 2017-10-04 DIAGNOSIS — I890 Lymphedema, not elsewhere classified: Secondary | ICD-10-CM

## 2017-10-04 LAB — BASIC METABOLIC PANEL WITH GFR
Anion gap: 6 (ref 5–15)
BUN: 33 mg/dL — ABNORMAL HIGH (ref 8–23)
CO2: 22 mmol/L (ref 22–32)
Chloride: 104 mmol/L (ref 98–111)
GFR calc non Af Amer: 45 mL/min — ABNORMAL LOW (ref >60)
Glucose, Bld: 107 mg/dL — ABNORMAL HIGH (ref 70–99)
Potassium: 4.1 mmol/L (ref 3.5–5.1)

## 2017-10-04 LAB — BASIC METABOLIC PANEL
Calcium: 7.5 mg/dL — ABNORMAL LOW (ref 8.9–10.3)
Creatinine, Ser: 1.2 mg/dL — ABNORMAL HIGH (ref 0.44–1.00)
GFR calc Af Amer: 52 mL/min — ABNORMAL LOW (ref >60)
Sodium: 132 mmol/L — ABNORMAL LOW (ref 135–145)

## 2017-10-04 LAB — CBC WITH DIFFERENTIAL/PLATELET
Basophils Absolute: 0 K/uL (ref 0.0–0.1)
Basophils Relative: 0 %
Eosinophils Absolute: 0.2 10*3/uL (ref 0.0–0.7)
Eosinophils Relative: 6 %
HCT: 24.1 % — ABNORMAL LOW (ref 36.0–46.0)
Hemoglobin: 8 g/dL — ABNORMAL LOW (ref 12.0–15.0)
Lymphocytes Relative: 16 %
Lymphs Abs: 0.6 K/uL (ref 0.7–4.0)
MCH: 27.9 pg (ref 26.0–34.0)
MCHC: 33.2 g/dL (ref 30.0–36.0)
MCV: 84 fL (ref 78.0–100.0)
Monocytes Absolute: 0.4 K/uL (ref 0.1–1.0)
Monocytes Relative: 10 %
Neutro Abs: 2.5 K/uL (ref 1.7–7.7)
Neutrophils Relative %: 68 %
Platelets: 117 10*3/uL — ABNORMAL LOW (ref 150–400)
RBC: 2.87 MIL/uL — ABNORMAL LOW (ref 3.87–5.11)
RDW: 15.1 % (ref 11.5–15.5)
WBC: 3.7 10*3/uL — ABNORMAL LOW (ref 4.0–10.5)

## 2017-10-04 MED ORDER — MUSCLE RUB 10-15 % EX CREA: 1.0000 "application " | TOPICAL_CREAM | CUTANEOUS | 0 refills | Status: DC | PRN

## 2017-10-04 MED ORDER — OXYCODONE HCL 10 MG PO TABS: 10.0000 mg | ORAL_TABLET | Freq: Four times a day (QID) | ORAL | 0 refills | Status: DC | PRN

## 2017-10-04 MED ORDER — LACTULOSE 10 GM/15ML PO SOLN: 20.0000 g | Freq: Two times a day (BID) | ORAL | 0 refills | Status: DC

## 2017-10-04 NOTE — Consult Note (Signed)
   [facility] CM Inpatient Consult   10/04/2017  Elsia Lasota [birth year] [phone]   Patient screened for potential [facility] Care Management program services due to unplanned readmission risk score of  38% extreme.   Chart reviewed. Noted discharge plan is for SNF today.    Marthenia Rolling, MSN-Ed, RN,BSN [facility] Liaison [phone]

## 2017-10-04 NOTE — Clinical Social Work Placement (Signed)
   CLINICAL SOCIAL WORK PLACEMENT  NOTE  Date:  10/04/2017  Patient Details  Name: Emily Kennedy MRN: [id number] Date of Birth: 10-Apr-1945  Clinical Social Work is seeking post-discharge placement for this patient at the [place] level of care (*CSW will initial, date and re-position this form in  chart as items are completed):  Yes   Patient/family provided with [place] Work Department's list of facilities offering this level of care within the geographic area requested by the patient (or if unable, by the patient's family).  Yes   Patient/family informed of their freedom to choose among providers that offer the needed level of care, that participate in Medicare, Medicaid or managed care program needed by the patient, have an available bed and are willing to accept the patient.  Yes   Patient/family informed of [facility]'s ownership interest in [facility] and [facility], as well as of the fact that they are under no obligation to receive care at these facilities.  PASRR submitted to EDS on       PASRR number received on       Existing PASRR number confirmed on 09/29/17     FL2 transmitted to all facilities in geographic area requested by pt/family on 09/29/17     FL2 transmitted to all facilities within larger geographic area on       Patient informed that his/her managed care company has contracts with or will negotiate with certain facilities, including the following:        Yes   Patient/family informed of bed offers received.  Patient chooses bed at [facility]     Physician recommends and patient chooses bed at     [facility]  Patient to be transferred to   on  . [place]   Patient to be transferred to facility by   Isaias Cowman      Patient family notified on   of transfer. 10/04/2017  Name of family member notified:      Daughter   PHYSICIAN       Additional Comment:     _______________________________________________ Lia Hopping, LCSW 10/04/2017, 3:51 PM

## 2017-10-04 NOTE — Progress Notes (Signed)
Pt stated feeling nauseated, small amount of vomit (glaucerna/clear) zofran 4mg  IV given. Pt has 0600 PO meds. Pt stable at this time.

## 2017-10-04 NOTE — Discharge Summary (Signed)
Physician Discharge Summary  Emily Kennedy XKG:[id number] DOB: [birth year] DOA: 09/27/2017  PCP: [name], MD  Admit date: 09/27/2017 Discharge date: 10/04/2017  Time spent: 65 minutes  Recommendations for Outpatient Follow-up:  1. Follow-up with MD at skilled nursing facility.  Patient will need a basic metabolic profile done in 1 week to follow-up on electrolytes and renal function.  Patient will also need a CBC done to follow-up on counts.  Patient thyroid function studies done in 4 weeks. 2. Follow-up with [name], hematology/oncology as scheduled.   Discharge Diagnoses:  Principal Problem:   ARF (acute renal failure) (HCC) Active Problems:   Diabetes mellitus type 2, uncontrolled ([place])   Hypothyroidism   Essential hypertension   Controlled diabetes mellitus type 2 with complications (HCC)   Malnutrition of moderate degree   MDS (myelodysplastic syndrome) (HCC)   Malignant lymphoma, lymphoplasmacytic (HCC)   Pancytopenia (HCC)   Acute lower UTI   Constipation   Lymphedema   Discharge Condition: Stable and improved  Diet recommendation: Heart healthy  Filed Weights   10/02/17 0506 10/03/17 0523 10/04/17 0504  Weight: 83.9 kg (184 lb 15.5 oz) 84.4 kg (186 lb 1.1 oz) 84 kg (185 lb 3 oz)    History of present illness:  Per Dr [name]  is a 72 y.o. female, 72 y.o.femalewith medical history significant ofHTN, HLD,CHF, DM type II, vasculitis, and malignant lymphoma currently receiving chemotherapy followed by [name] of oncology who presented with not feeling well.  Slight nausea.  Denied fever, chills, flank pain, diarrhea, brbpr, dysuria, hematuria.  Pt was brought to ED for evaluation.     In ED, pt noted to be in ARF.  Renal ultrasound IMPRESSION: 1. Echogenic kidneys compatible with medical renal disease. 2. Mild RIGHT hydronephrosis. 3. Cirrhosis and ascites.   CXR IMPRESSION: 1. Unchanged left greater than right pleural effusions. 2.  Persistent left lower lobe consolidation/atelectasis. Increased medial right basilar atelectasis. 3. Resolved pulmonary edema.  Urinalysis wbc 21-50, rbc 21-50  Magnesium 2.3,  Na 130, K 4.7, Bun 67, Creatinine 6.03 Ast 13, Alt 8, Alb 2.0  Wbc 1.3, hgb 9.5, Plt 118  INR 1.19  Pt will be admitted for acute renal failure and uti, and pancytopenia    Hospital Course:  #1) AKI on stage III CKD:  Patient had presented with acute on chronic kidney disease stage III with a creatinine went up as high as 6.03 on admission.  Patient noted on admission to be on torsemide 20 mg daily on admission in addition to other antihypertensive medications.  CT renal stone protocol done showed mild right-sided hydronephrosis with no obstructing calcified stone noted.  Renal ultrasound done consistent with echogenic kidneys compatible with medical renal disease, mild right hydronephrosis, cirrhosis and ascites.  Nephrotoxins were held as well as diuretics.  Patient was gently hydrated with IV fluids.  Urinalysis which was done showed moderate leukocytes nitrite -100 protein 21-50 WBCs.  Urine cultures which were done were negative.  Patient improved clinically with gentle hydration and renal function improved such that by day of discharge creatinine was down to 1.20.  Patient noted to have a chronic lymphedema of lower extremities.  Patient's torsemide will be discontinued on discharge.  Outpatient follow up.  #2) myelodysplastic syndrome and lymphoplasmacytic lymphoma complicated by pancytopenia:  Patient noted on admission to have a pancytopenia with a white count of 1.1, hemoglobin of 8.2, platelet count of 123.  Patient was monitored.  Patient had no overt bleeding.  Urine  cultures which were done were negative.  Patient remained afebrile.  Patient's counts improved such that by day of discharge white count was up to 3.7, platelets of 117 with a hemoglobin of 8.0.  Outpatient follow-up with  hematology/oncology.    #3) Hypertension: -  Patient maintained on home regimen of amlodipine 10 mg daily, diltiazem 120 mg every 8 hours, hydralazine 100 mg 3 times daily.  Outpatient follow-up.   #4) Hypothyroidism: -Patient maintained on home regimen of Synthroid to 24 MCG's daily.  Patient will need repeat thyroid function studies done in about 4 weeks.  Outpatient follow-up.  #5).  Constipation Patient placed on lactulose twice daily during the hospitalization.  Patient will be discharged on this regimen.   #6) Pain/psych: - Continued on home regimen of PRN oxycodone  #7 chronic lymphedema Stable.  Outpatient follow-up.  8.  Moderate malnutrition Patient placed on nutritional supplementation.       Procedures:  CT renal stone protocol 09/27/2017  Chest x-ray 09/27/2017  MRI L spine/sacrum 09/28/2017  Renal ultrasound 09/27/2017  Consultations:  None  Discharge Exam: Vitals:   10/04/17 1142 10/04/17 1351  BP:  138/75  Pulse:  91  Resp:  18  Temp:  98 F (36.7 C)  SpO2: 93% 97%    General: NAD Cardiovascular: RRR Respiratory: CTAB Ext: Lymphedema/3+ bilateral lower extremity edema  Discharge Instructions   Discharge Instructions    Diet - low sodium heart healthy   Complete by:  As directed    Increase activity slowly   Complete by:  As directed      Allergies as of 10/04/2017      Reactions   Benadryl [diphenhydramine] Swelling   Ciprofloxacin Hives   Furosemide Itching, Swelling, Rash, Other (See Comments)   Made edema worse   Nitrofurantoin Monohyd Macro Hives   Septra [bactrim] Hives   Sulfa Drugs Cross Reactors Hives   Allopurinol Itching   Ceclor [cefaclor] Rash   Breaks out in rash chest down.      Medication List    STOP taking these medications   amoxicillin-clavulanate 875-125 MG tablet Commonly known as:  AUGMENTIN   torsemide 10 MG tablet Commonly known as:  DEMADEX     TAKE these medications   acetaminophen 325  MG tablet Commonly known as:  TYLENOL Take 2 tablets (650 mg total) by mouth every 6 (six) hours as needed for fever.   albuterol (2.5 MG/3ML) 0.083% nebulizer solution Commonly known as:  PROVENTIL Take 3 mLs (2.5 mg total) by nebulization every 4 (four) hours as needed for wheezing or shortness of breath.   amLODipine 10 MG tablet Commonly known as:  NORVASC Take 1 tablet (10 mg total) by mouth daily.   calcium carbonate 500 MG chewable tablet Commonly known as:  TUMS - dosed in mg elemental calcium Chew 1 tablet by mouth daily.   chlorhexidine 0.12 % solution Commonly known as:  PERIDEX Use as directed 15 mLs in the mouth or throat 2 (two) times daily.   cholecalciferol 400 units Tabs tablet Commonly known as:  VITAMIN D Take 400 Units by mouth daily.   diltiazem 120 MG tablet Commonly known as:  CARDIZEM Take 1 tablet (120 mg total) by mouth every 8 (eight) hours.   famotidine 20 MG tablet Commonly known as:  PEPCID Take 1 tablet (20 mg total) by mouth 2 (two) times daily.   feeding supplement (GLUCERNA SHAKE) Liqd Take 237 mLs by mouth 3 (three) times daily between meals.  ferrous sulfate 325 (65 FE) MG tablet Take 325 mg by mouth 2 (two) times daily with a meal.   Fish Oil 1000 MG Caps Take 4,000 mg by mouth daily.   fluticasone 50 MCG/ACT nasal spray Commonly known as:  FLONASE Place 1 spray into both nostrils daily.   hydrALAZINE 100 MG tablet Commonly known as:  APRESOLINE Take 100 mg by mouth 3 (three) times daily.   ipratropium-albuterol 0.5-2.5 (3) MG/3ML Soln Commonly known as:  DUONEB Take 3 mLs by nebulization 2 (two) times daily.   lactulose 10 GM/15ML solution Commonly known as:  CHRONULAC Take 30 mLs (20 g total) by mouth 2 (two) times daily.   levothyroxine 112 MCG tablet Commonly known as:  SYNTHROID, LEVOTHROID Take 224 mcg by mouth daily before breakfast.   lidocaine-prilocaine cream Commonly known as:  EMLA Apply to affected area  once What changed:    how much to take  how to take this  when to take this  additional instructions   multivitamin tablet Take 1 tablet by mouth daily.   MUSCLE RUB 10-15 % Crea Apply 1 application topically as needed for muscle pain.   ondansetron 4 MG tablet Commonly known as:  ZOFRAN Take 1 tablet (4 mg total) by mouth every 6 (six) hours as needed for nausea. What changed:  when to take this   Oxycodone HCl 10 MG Tabs Take 1 tablet (10 mg total) by mouth every 6 (six) hours as needed for up to 14 days (pain).   senna 8.6 MG Tabs tablet Commonly known as:  SENOKOT Take 2 tablets by mouth 2 (two) times daily as needed for mild constipation.   vitamin B-12 500 MCG tablet Commonly known as:  CYANOCOBALAMIN Take 500 mcg by mouth daily.   zinc gluconate 50 MG tablet Take 50 mg by mouth daily.      Allergies  Allergen Reactions  . Benadryl [Diphenhydramine] Swelling  . Ciprofloxacin Hives  . Furosemide Itching, Swelling, Rash and Other (See Comments)    Made edema worse  . Nitrofurantoin Monohyd Macro Hives  . Septra [Bactrim] Hives  . Sulfa Drugs Cross Reactors Hives  . Allopurinol Itching  . Ceclor [Cefaclor] Rash    Breaks out in rash chest down.    Contact information for follow-up providers    MD AT SNF Follow up.        Brunetta Genera, MD. Schedule an appointment as soon as possible for a visit.   Specialties:  Hematology, Oncology Why:  F/U AS SCHEDULED Contact information: [place] [address] [phone]            Contact information for after-discharge care    Destination    [facility] .   Service:  Skilled Nursing Contact information: [address] [place] [phone]                   The results of significant diagnostics from this hospitalization (including imaging, microbiology, ancillary and laboratory) are listed below for  reference.    Significant Diagnostic Studies: Mr Lumbar Spine Wo Contrast  Result Date: 09/28/2017 CLINICAL DATA:  Initial evaluation for recent increase in lower back pain. Urinary retention with constipation. History of lymphoma, CHF, renal failure with ascites. EXAM: MRI LUMBAR SPINE WITHOUT CONTRAST MRI SACRUM WITHOUT CONTRAST TECHNIQUE: Multiplanar, multisequence MR imaging of the lumbar spine and sacrum was performed. No intravenous contrast was administered. COMPARISON:  Prior CT from 09/27/2017  FINDINGS: MRI LUMBAR SPINE: Segmentation: Examination severely limited due to poor signal strain due to patient's extensive ascites. Multiple sequences are fairly limited and essentially nondiagnostic in nature, limiting evaluation of the spine. Normal segmentation. Lowest well-formed disc labeled the L5-S1 level. Alignment: Vertebral bodies normally aligned with preservation of the normal lumbar lordosis. No listhesis or subluxation. Vertebrae: Vertebral body heights are grossly maintained without appreciable acute or chronic fracture. Mild height loss at the superior endplate of L3 favored to be degenerative in nature. Bone marrow signal intensity poorly evaluated on this exam due to technical limitations. No obvious discrete or focal osseous lesion. Conus medullaris and cauda equina: Conus extends to approximately the T12-L1 level. No evidence for cord compression. Cauda equina in conus medullaris appear grossly within normal limits. Paraspinal and other soft tissues: Paraspinous soft tissues demonstrate no obvious acute abnormality. Diffuse anasarca noted. Intra-abdominal ascites better evaluated on prior CT. Partially visualized visceral structures not well assessed on this examination. Disc levels: T12-L1: Mild diffuse disc bulge with disc desiccation. Superimposed left paracentral disc protrusion mildly indents the left ventral thecal sac (series 4, image 7). No significant stenosis. Foramina remain  patent. L1-2:  Unremarkable. L2-3: Suspected mild disc desiccation without significant disc bulge. No appreciable stenosis. L3-4:  Grossly unremarkable.  No appreciable stenosis. L4-5: Mild disc bulge with disc desiccation. Suspected mild facet hypertrophy. Resultant mild canal with bilateral lateral recess narrowing. Probable mild bilateral foraminal stenosis. L5-S1:  Mild facet hypertrophy.  No appreciable stenosis. MRI SACRUM: Examination severely limited due to extensive ascites and resultant poor signal strength. Multiple sequences are nearly nondiagnostic. Sacrum grossly intact without obvious acute abnormality. SI joints approximated and symmetric. Visualized bones of the pelvis demonstrate no obvious abnormality. Bone marrow signal intensity poorly evaluated on this exam due to technical limitations. No obvious discrete osseous lesion. Extensive ascites noted within the visualized abdomen and pelvis. Large volume retained stool within the distal colon and rectal vault, suggesting constipation. Remainder of the pelvis poorly evaluated on this exam. Diffuse anasarca noted.  No other obvious soft tissue abnormality. IMPRESSION: MRI LUMBAR SPINE IMPRESSION 1. Severely limited study due to extensive ascites and resultant poor signal strength. 2. No evidence for cord compression or significant spinal stenosis within the lumbar spine. No obvious acute abnormality. 3. Left paracentral disc protrusion at T12-L1 without significant stenosis. 4. Mild disc bulge and facet hypertrophy at L4-5 with resultant mild spinal stenosis. 5. Large volume ascites, better evaluated on recent CT. MRI SACRUM IMPRESSION 1. Severely limited study due to extensive ascites and resultant poor signal strength. Multiple sequences are essentially nondiagnostic. 2. No obvious acute abnormality about the sacrum. 3. Large volume retained stool within the distal colon and rectal vault, suggesting constipation. 4. Large volume ascites, better  evaluated on recent CT. Electronically Signed   By: Jeannine Boga M.D.   On: 09/28/2017 19:32   US Renal  Result Date: 09/27/2017 CLINICAL DATA:  Acute kidney injury. History of diabetes, hypertension. EXAM: RENAL / URINARY TRACT ULTRASOUND COMPLETE COMPARISON:  CT abdomen and pelvis August 31, 2017 FINDINGS: Right Kidney: Length: 12 cm. Increased parenchymal echogenicity. Mild hydronephrosis. No suspicious mass. Left Kidney: Length: 9.8 cm. Increased parenchymal echogenicity. No suspicious mass or hydronephrosis visualized. Bladder: Foley catheter. Anechoic free fluid.  Nodular liver.  Anasarca. IMPRESSION: 1. Echogenic kidneys compatible with medical renal disease. 2. Mild RIGHT hydronephrosis. 3. Cirrhosis and ascites. Electronically Signed   By: Elon Alas M.D.   On: 09/27/2017 15:52   Mr Sacrum Si Joints  Wo Contrast  Result Date: 09/28/2017 CLINICAL DATA:  Initial evaluation for recent increase in lower back pain. Urinary retention with constipation. History of lymphoma, CHF, renal failure with ascites. EXAM: MRI LUMBAR SPINE WITHOUT CONTRAST MRI SACRUM WITHOUT CONTRAST TECHNIQUE: Multiplanar, multisequence MR imaging of the lumbar spine and sacrum was performed. No intravenous contrast was administered. COMPARISON:  Prior CT from 09/27/2017 FINDINGS: MRI LUMBAR SPINE: Segmentation: Examination severely limited due to poor signal strain due to patient's extensive ascites. Multiple sequences are fairly limited and essentially nondiagnostic in nature, limiting evaluation of the spine. Normal segmentation. Lowest well-formed disc labeled the L5-S1 level. Alignment: Vertebral bodies normally aligned with preservation of the normal lumbar lordosis. No listhesis or subluxation. Vertebrae: Vertebral body heights are grossly maintained without appreciable acute or chronic fracture. Mild height loss at the superior endplate of L3 favored to be degenerative in nature. Bone marrow signal intensity  poorly evaluated on this exam due to technical limitations. No obvious discrete or focal osseous lesion. Conus medullaris and cauda equina: Conus extends to approximately the T12-L1 level. No evidence for cord compression. Cauda equina in conus medullaris appear grossly within normal limits. Paraspinal and other soft tissues: Paraspinous soft tissues demonstrate no obvious acute abnormality. Diffuse anasarca noted. Intra-abdominal ascites better evaluated on prior CT. Partially visualized visceral structures not well assessed on this examination. Disc levels: T12-L1: Mild diffuse disc bulge with disc desiccation. Superimposed left paracentral disc protrusion mildly indents the left ventral thecal sac (series 4, image 7). No significant stenosis. Foramina remain patent. L1-2:  Unremarkable. L2-3: Suspected mild disc desiccation without significant disc bulge. No appreciable stenosis. L3-4:  Grossly unremarkable.  No appreciable stenosis. L4-5: Mild disc bulge with disc desiccation. Suspected mild facet hypertrophy. Resultant mild canal with bilateral lateral recess narrowing. Probable mild bilateral foraminal stenosis. L5-S1:  Mild facet hypertrophy.  No appreciable stenosis. MRI SACRUM: Examination severely limited due to extensive ascites and resultant poor signal strength. Multiple sequences are nearly nondiagnostic. Sacrum grossly intact without obvious acute abnormality. SI joints approximated and symmetric. Visualized bones of the pelvis demonstrate no obvious abnormality. Bone marrow signal intensity poorly evaluated on this exam due to technical limitations. No obvious discrete osseous lesion. Extensive ascites noted within the visualized abdomen and pelvis. Large volume retained stool within the distal colon and rectal vault, suggesting constipation. Remainder of the pelvis poorly evaluated on this exam. Diffuse anasarca noted.  No other obvious soft tissue abnormality. IMPRESSION: MRI LUMBAR SPINE  IMPRESSION 1. Severely limited study due to extensive ascites and resultant poor signal strength. 2. No evidence for cord compression or significant spinal stenosis within the lumbar spine. No obvious acute abnormality. 3. Left paracentral disc protrusion at T12-L1 without significant stenosis. 4. Mild disc bulge and facet hypertrophy at L4-5 with resultant mild spinal stenosis. 5. Large volume ascites, better evaluated on recent CT. MRI SACRUM IMPRESSION 1. Severely limited study due to extensive ascites and resultant poor signal strength. Multiple sequences are essentially nondiagnostic. 2. No obvious acute abnormality about the sacrum. 3. Large volume retained stool within the distal colon and rectal vault, suggesting constipation. 4. Large volume ascites, better evaluated on recent CT. Electronically Signed   By: Jeannine Boga M.D.   On: 09/28/2017 19:32   Dg Chest Port 1 View  Result Date: 09/27/2017 CLINICAL DATA:  Renal failure.  History of lymphoma. EXAM: PORTABLE CHEST 1 VIEW COMPARISON:  Chest x-ray dated September 01, 2017. FINDINGS: Unchanged right chest wall port catheter. Stable cardiomegaly. Resolved pulmonary edema. Unchanged  small left greater than right pleural effusions with left lower lobe atelectasis. New atelectasis in the medial right lung base. No pneumothorax. No acute osseous abnormality. IMPRESSION: 1. Unchanged left greater than right pleural effusions. 2. Persistent left lower lobe consolidation/atelectasis. Increased medial right basilar atelectasis. 3. Resolved pulmonary edema. Electronically Signed   By: Titus Dubin M.D.   On: 09/27/2017 12:26   Ct Renal Stone Study  Result Date: 09/27/2017 CLINICAL DATA:  72 year old female with malignant lymphoma. Difficulty going to the bathroom for the past couple days. Subsequent encounter. EXAM: CT ABDOMEN AND PELVIS WITHOUT CONTRAST TECHNIQUE: Multidetector CT imaging of the abdomen and pelvis was performed following the standard  protocol without IV contrast. COMPARISON:  09/27/2017 CT.  08/31/2017 CT.  07/22/2017 PET-CT. FINDINGS: Lower chest: Bibasilar atelectasis versus infiltrates with small pleural effusions greater on the left. Medial right middle lobe atelectasis/scarring. Cardiomegaly. Central line tip caval atrial junction. Coronary artery calcifications. Aortic valve calcifications. Probable anemia given density of cardiac contents. Hepatobiliary: Taking into account limitation by non contrast imaging, no focal hepatic lesion. Radiopaque material and gallbladder may represent sludge. Pancreas: Taking into account limitation by non contrast imaging, no focal pancreatic lesion. Spleen: Taking into account limitation by non contrast imaging, splenomegaly without focal lesion. Adrenals/Urinary Tract: Mild right-sided hydronephrosis. Etiology indeterminate as no obstructing calcified stone is noted. Bladder decompressed by Foley catheter. Taking into account limitation by non contrast imaging, no renal or adrenal mass. Tiny nonobstructing left renal calculus versus vascular calcification. Stomach/Bowel: Evaluation of bowel limited by ascites and third spacing of fluid. Contrast is seen throughout the colon suggesting no high-grade obstructing lesion. Stomach tenting along the anterior wall which may be related to prior gastrostomy placement. Small fat containing hernia with overlying skin defect inferior to this level unchanged. Vascular/Lymphatic: Atherosclerotic changes aorta and aortic branch vessels. No abdominal aortic aneurysm. Increase number of normal size lymph nodes retroperitoneal and mesenteric region. Reproductive: No adnexal or uterine mass noted. Other: Moderate amount of ascites and third spacing of fluid. No free intraperitoneal air. Musculoskeletal: No acute osseous abnormality. IMPRESSION: Mild right-sided hydronephrosis. Etiology indeterminate as no obstructing calcified stone is noted. Contrast throughout colon  without evidence of obstruction. Ascites and third spacing of fluid. Splenomegaly. Bibasilar atelectasis/infiltrates with small pleural effusions. Cardiomegaly. Aortic Atherosclerosis (ICD10-I70.0). Electronically Signed   By: Genia Del M.D.   On: 09/27/2017 17:08    Microbiology: Recent Results (from the past 240 hour(s))  Urine culture     Status: None   Collection Time: 09/27/17  2:03 PM  Result Value Ref Range Status   Specimen Description   Final    URINE, CLEAN CATCH Performed at [facility] [address] [place], [place] [address]    Special Requests   Final    Normal Performed at [facility], [place] [address], [place], [place] [address]    Culture   Final    NO GROWTH Performed at [facility], [place] [address], [place],  [address]    Report Status 09/29/2017 FINAL  Final     Labs: Basic Metabolic Panel: Recent Labs  Lab 09/30/17 0554 10/01/17 0332 10/02/17 0507 10/03/17 0500 10/04/17 0352  NA 135 137 136 132* 132*  K 3.1* 3.4* 3.4* 3.7 4.1  CL 105 108 108 105 104  CO2 21* 22 23 22 22   GLUCOSE 124* 111* 115* 113* 107*  BUN 46* 40* 36* 35* 33*  CREATININE 2.64* 2.08* 1.65* 1.30* 1.20*  CALCIUM 7.8*  7.9* 7.7* 7.5* 7.5*  MG  --   --  1.9  --   --   PHOS 4.0  --   --   --   --    Liver Function Tests: Recent Labs  Lab 09/28/17 0529 09/30/17 0554 10/01/17 0332 10/02/17 0507 10/03/17 0500  AST 11* 12* 13* 14* 14*  ALT 9 9 10 10 9   ALKPHOS 63 52 57 61 55  BILITOT 0.4 0.4 0.5 0.3 0.5  PROT 4.0* 4.5* 4.5* 4.1* 4.1*  ALBUMIN 1.6* 2.4* 2.1* 1.9* 1.9*   No results for input(s): LIPASE, AMYLASE in the last 168 hours. No results for input(s): AMMONIA in the last 168 hours. CBC: Recent Labs  Lab 09/29/17 0550 09/29/17 1256 10/01/17 0332 10/02/17 0507 10/03/17 0500 10/04/17 0352  WBC 1.6* 1.7* 2.3* 2.7* 2.6* 3.7*  NEUTROABS 0.6* 0.8* 1.1*  --   --  2.5  HGB 8.2* 8.8* 8.5* 7.8* 7.7* 8.0*  HCT 24.3*  25.6* 24.7* 23.0* 22.9* 24.1*  MCV 82.1 82.1 82.3 82.7 83.3 84.0  PLT 147* 155 132* 104* 103* 117*   Cardiac Enzymes: No results for input(s): CKTOTAL, CKMB, CKMBINDEX, TROPONINI in the last 168 hours. BNP: BNP (last 3 results) Recent Labs    10/27/16 1050 08/14/17 0901  BNP 72.8 305.3*    ProBNP (last 3 results) No results for input(s): PROBNP in the last 8760 hours.  CBG: Recent Labs  Lab 09/28/17 2107 10/02/17 2237  GLUCAP 140* 132*       Signed:  Irine Seal MD.  [facility] 10/04/2017, 3:40 PM

## 2017-10-04 NOTE — Telephone Encounter (Signed)
Spoke with patient daughter concerning the r/s of the patient appointment the daughter said that she is currently still in the hospital since her last visit, and daughter said treatment was going to be cancel;ed. I Levada Dy) explained to her that we will cancel when we receive a sch msg. Per 7/23 (corrections of los). Mailed a letter and calender of these changes

## 2017-10-05 DIAGNOSIS — E1165 Type 2 diabetes mellitus with hyperglycemia: Secondary | ICD-10-CM

## 2017-10-05 LAB — CBC
HEMATOCRIT: 23.3 % — AB (ref 36.0–46.0)
Hemoglobin: 7.8 g/dL — ABNORMAL LOW (ref 12.0–15.0)
MCH: 28 pg (ref 26.0–34.0)
MCHC: 33.5 g/dL (ref 30.0–36.0)
MCV: 83.5 fL (ref 78.0–100.0)
PLATELETS: 114 10*3/uL — AB (ref 150–400)
RBC: 2.79 MIL/uL — ABNORMAL LOW (ref 3.87–5.11)
RDW: 15 % (ref 11.5–15.5)
WBC: 4.4 10*3/uL (ref 4.0–10.5)

## 2017-10-05 LAB — COMPREHENSIVE METABOLIC PANEL
ALT: 11 U/L (ref 0–44)
ANION GAP: 6 (ref 5–15)
AST: 17 U/L (ref 15–41)
Albumin: 2 g/dL — ABNORMAL LOW (ref 3.5–5.0)
Alkaline Phosphatase: 62 U/L (ref 38–126)
BILIRUBIN TOTAL: 0.3 mg/dL (ref 0.3–1.2)
BUN: 31 mg/dL — ABNORMAL HIGH (ref 8–23)
CHLORIDE: 106 mmol/L (ref 98–111)
CO2: 24 mmol/L (ref 22–32)
Calcium: 7.8 mg/dL — ABNORMAL LOW (ref 8.9–10.3)
Creatinine, Ser: 1.1 mg/dL — ABNORMAL HIGH (ref 0.44–1.00)
GFR, EST AFRICAN AMERICAN: 58 mL/min — AB (ref >60)
GFR, EST NON AFRICAN AMERICAN: 50 mL/min — AB (ref >60)
Glucose, Bld: 116 mg/dL — ABNORMAL HIGH (ref 70–99)
POTASSIUM: 4.1 mmol/L (ref 3.5–5.1)
Sodium: 136 mmol/L (ref 135–145)
TOTAL PROTEIN: 4.3 g/dL — AB (ref 6.5–8.1)

## 2017-10-05 MED ORDER — LACTULOSE 10 GM/15ML PO SOLN: 20.0000 g | Freq: Every day | ORAL | 0 refills | Status: DC

## 2017-10-05 MED ORDER — PRO-STAT SUGAR FREE PO LIQD: 30.0000 mL | Freq: Two times a day (BID) | ORAL | 0 refills | Status: DC

## 2017-10-05 MED ORDER — LACTULOSE 10 GM/15ML PO SOLN: 20.0000 g | Freq: Every day | ORAL | Status: DC

## 2017-10-05 MED ORDER — HEPARIN SOD (PORK) LOCK FLUSH 100 UNIT/ML IV SOLN
500.0000 [IU] | INTRAVENOUS | Status: AC | PRN
  Administered 2017-10-05: 500 [IU]

## 2017-10-05 MED ORDER — PRO-STAT SUGAR FREE PO LIQD: 30.0000 mL | Freq: Two times a day (BID) | ORAL | Status: DC

## 2017-10-05 MED ORDER — ENSURE ENLIVE PO LIQD: 237.0000 mL | Freq: Two times a day (BID) | ORAL | Status: DC

## 2017-10-05 NOTE — Progress Notes (Signed)
FC removed at 0930 per MD order. At 1430, pt had not voided and denied urge to void. Bladder scan showing 495 ml. 14 Fr FC replaced per MD order for retention. Pt tolerated well. 425 ml yellow, cloudy urine returned. Also, BLE wrapped up to thighs with Ace wrap per MD order.

## 2017-10-05 NOTE — Progress Notes (Signed)
Report called to nurse [facility] at [facility] prior to D/C.

## 2017-10-05 NOTE — Discharge Summary (Addendum)
Physician Discharge Summary  Emily Kennedy FMB:[id number] DOB: 1945-03-22 DOA: 09/27/2017  PCP: [name], MD  Admit date: 09/27/2017 Discharge date: 10/05/2017  Time spent: 65 minutes  Recommendations for Outpatient Follow-up:  1. Follow-up with MD at skilled nursing facility.  Patient will need a basic metabolic profile done in 1 week to follow-up on electrolytes and renal function.  Patient will also need a CBC done to follow-up on counts.  Patient thyroid function studies done in 4 weeks. 2. Follow-up with [name], hematology/oncology as scheduled. 3. Follow-up with urology in 1 week for voiding trial and further evaluation and management of urinary retention.   Discharge Diagnoses:  Principal Problem:   ARF (acute renal failure) (HCC) Active Problems:   Diabetes mellitus type 2, uncontrolled ([place])   Hypothyroidism   Essential hypertension   Controlled diabetes mellitus type 2 with complications (HCC)   Malnutrition of moderate degree   MDS (myelodysplastic syndrome) (HCC)   Malignant lymphoma, lymphoplasmacytic (HCC)   Pancytopenia (HCC)   Acute lower UTI   Constipation   Lymphedema   Discharge Condition: Stable and improved  Diet recommendation: Heart healthy  Filed Weights   10/03/17 0523 10/04/17 0504 10/05/17 0459  Weight: 84.4 kg (186 lb 1.1 oz) 84 kg (185 lb 3 oz) 84 kg (185 lb 3 oz)    History of present illness:  Per Dr Emily Kennedy  is a 72 y.o. female, 72 y.o.femalewith medical history significant ofHTN, HLD,CHF, DM type II, vasculitis, and malignant lymphoma currently receiving chemotherapy followed by [name] of oncology who presented with not feeling well.  Slight nausea.  Denied fever, chills, flank pain, diarrhea, brbpr, dysuria, hematuria.  Pt was brought to ED for evaluation.     In ED, pt noted to be in ARF.  Renal ultrasound IMPRESSION: 1. Echogenic kidneys compatible with medical renal disease. 2. Mild RIGHT hydronephrosis. 3.  Cirrhosis and ascites.   CXR IMPRESSION: 1. Unchanged left greater than right pleural effusions. 2. Persistent left lower lobe consolidation/atelectasis. Increased medial right basilar atelectasis. 3. Resolved pulmonary edema.  Urinalysis wbc 21-50, rbc 21-50  Magnesium 2.3,  Na 130, K 4.7, Bun 67, Creatinine 6.03 Ast 13, Alt 8, Alb 2.0  Wbc 1.3, hgb 9.5, Plt 118  INR 1.19  Pt will be admitted for acute renal failure and uti, and pancytopenia    Hospital Course:  #1) AKI on stage III CKD:  Patient had presented with acute on chronic kidney disease stage III with a creatinine went up as high as 6.03 on admission.  Patient noted on admission to be on torsemide 20 mg daily on admission in addition to other antihypertensive medications.  CT renal stone protocol done showed mild right-sided hydronephrosis with no obstructing calcified stone noted.  Renal ultrasound done consistent with echogenic kidneys compatible with medical renal disease, mild right hydronephrosis, cirrhosis and ascites.  Nephrotoxins were held as well as diuretics.  Patient was gently hydrated with IV fluids.  Urinalysis which was done showed moderate leukocytes nitrite -100 protein 21-50 WBCs.  Urine cultures which were done were negative.  Patient improved clinically with gentle hydration and renal function improved such that by day of discharge creatinine was down to 1.20.  Patient noted to have a chronic lymphedema of lower extremities.  Patient's torsemide will be discontinued on discharge.  Outpatient follow up.  #2) myelodysplastic syndrome and lymphoplasmacytic lymphoma complicated by pancytopenia:  Patient noted on admission to have a pancytopenia with a white count of 1.1, hemoglobin  of 8.2, platelet count of 123.  Patient was monitored.  Patient had no overt bleeding.  Urine cultures which were done were negative.  Patient remained afebrile.  Patient's counts improved such that by day of discharge white  count was up to 3.7, platelets of 117 with a hemoglobin of 8.0.  Outpatient follow-up with hematology/oncology.    #3) Hypertension: -  Patient maintained on home regimen of amlodipine 10 mg daily, diltiazem 120 mg every 8 hours, hydralazine 100 mg 3 times daily.  Outpatient follow-up.   #4) Hypothyroidism: -Patient maintained on home regimen of Synthroid to 24 MCG's daily.  Patient will need repeat thyroid function studies done in about 4 weeks.  Outpatient follow-up.  #5).  Constipation Patient placed on lactulose twice daily during the hospitalization.  Patient will be discharged on this regimen.   #6) Pain/psych: - Continued on home regimen of PRN oxycodone  #7 chronic lymphedema Stable.  Outpatient follow-up.  8.  Moderate malnutrition Patient placed on nutritional supplementation.  9. Cirrhosis per CT Noted on CT. Diuretics d/c'd on admission due to ARF. Patient started on lactulose however had to be discontinued due to increased watery stools. Oupatient follow up.  10.  Urinary retention Foley catheter was removed however patient with decreased urine output.  Bladder scan done showed over 400 cc of urine.  Foley catheter was placed and patient will need outpatient follow-up for voiding trial with urology.       Procedures:  CT renal stone protocol 09/27/2017  Chest x-ray 09/27/2017  MRI L spine/sacrum 09/28/2017  Renal ultrasound 09/27/2017  Consultations:  None  Discharge Exam: Vitals:   10/05/17 0440 10/05/17 1342  BP: (!) 147/72 (!) 146/82  Pulse: 88 (!) 101  Resp: 16 20  Temp: 98.1 F (36.7 C) 98.1 F (36.7 C)  SpO2: 97% 97%    General: NAD Cardiovascular: RRR Respiratory: CTAB Ext: Lymphedema/3+ bilateral lower extremity edema  Discharge Instructions   Discharge Instructions    Diet - low sodium heart healthy   Complete by:  As directed    Increase activity slowly   Complete by:  As directed      Allergies as of 10/05/2017       Reactions   Benadryl [diphenhydramine] Swelling   Ciprofloxacin Hives   Furosemide Itching, Swelling, Rash, Other (See Comments)   Made edema worse   Nitrofurantoin Monohyd Macro Hives   Septra [bactrim] Hives   Sulfa Drugs Cross Reactors Hives   Allopurinol Itching   Ceclor [cefaclor] Rash   Breaks out in rash chest down.      Medication List    STOP taking these medications   amoxicillin-clavulanate 875-125 MG tablet Commonly known as:  AUGMENTIN   torsemide 10 MG tablet Commonly known as:  DEMADEX     TAKE these medications   acetaminophen 325 MG tablet Commonly known as:  TYLENOL Take 2 tablets (650 mg total) by mouth every 6 (six) hours as needed for fever.   albuterol (2.5 MG/3ML) 0.083% nebulizer solution Commonly known as:  PROVENTIL Take 3 mLs (2.5 mg total) by nebulization every 4 (four) hours as needed for wheezing or shortness of breath.   amLODipine 10 MG tablet Commonly known as:  NORVASC Take 1 tablet (10 mg total) by mouth daily.   calcium carbonate 500 MG chewable tablet Commonly known as:  TUMS - dosed in mg elemental calcium Chew 1 tablet by mouth daily.   chlorhexidine 0.12 % solution Commonly known as:  PERIDEX Use as  directed 15 mLs in the mouth or throat 2 (two) times daily.   cholecalciferol 400 units Tabs tablet Commonly known as:  VITAMIN D Take 400 Units by mouth daily.   diltiazem 120 MG tablet Commonly known as:  CARDIZEM Take 1 tablet (120 mg total) by mouth every 8 (eight) hours.   famotidine 20 MG tablet Commonly known as:  PEPCID Take 1 tablet (20 mg total) by mouth 2 (two) times daily.   feeding supplement (GLUCERNA SHAKE) Liqd Take 237 mLs by mouth 3 (three) times daily between meals.   feeding supplement (PRO-STAT SUGAR FREE 64) Liqd Take 30 mLs by mouth 2 (two) times daily between meals.   ferrous sulfate 325 (65 FE) MG tablet Take 325 mg by mouth 2 (two) times daily with a meal.   Fish Oil 1000 MG Caps Take 4,000  mg by mouth daily.   fluticasone 50 MCG/ACT nasal spray Commonly known as:  FLONASE Place 1 spray into both nostrils daily.   hydrALAZINE 100 MG tablet Commonly known as:  APRESOLINE Take 100 mg by mouth 3 (three) times daily.   ipratropium-albuterol 0.5-2.5 (3) MG/3ML Soln Commonly known as:  DUONEB Take 3 mLs by nebulization 2 (two) times daily.   levothyroxine 112 MCG tablet Commonly known as:  SYNTHROID, LEVOTHROID Take 224 mcg by mouth daily before breakfast.   lidocaine-prilocaine cream Commonly known as:  EMLA Apply to affected area once What changed:    how much to take  how to take this  when to take this  additional instructions   multivitamin tablet Take 1 tablet by mouth daily.   MUSCLE RUB 10-15 % Crea Apply 1 application topically as needed for muscle pain.   ondansetron 4 MG tablet Commonly known as:  ZOFRAN Take 1 tablet (4 mg total) by mouth every 6 (six) hours as needed for nausea. What changed:  when to take this   Oxycodone HCl 10 MG Tabs Take 1 tablet (10 mg total) by mouth every 6 (six) hours as needed for up to 14 days (pain).   senna 8.6 MG Tabs tablet Commonly known as:  SENOKOT Take 2 tablets by mouth 2 (two) times daily as needed for mild constipation.   vitamin B-12 500 MCG tablet Commonly known as:  CYANOCOBALAMIN Take 500 mcg by mouth daily.   zinc gluconate 50 MG tablet Take 50 mg by mouth daily.      Allergies  Allergen Reactions  . Benadryl [Diphenhydramine] Swelling  . Ciprofloxacin Hives  . Furosemide Itching, Swelling, Rash and Other (See Comments)    Made edema worse  . Nitrofurantoin Monohyd Macro Hives  . Septra [Bactrim] Hives  . Sulfa Drugs Cross Reactors Hives  . Allopurinol Itching  . Ceclor [Cefaclor] Rash    Breaks out in rash chest down.    Contact information for follow-up providers    MD AT SNF Follow up.        Brunetta Genera, MD. Schedule an appointment as soon as possible for a visit.    Specialties:  Hematology, Oncology Why:  F/U AS SCHEDULED Contact information: [place] [address] [phone]            Contact information for after-discharge care    Destination    [facility] .   Service:  Skilled Chiropodist information: [address] [place] [phone]  The results of significant diagnostics from this hospitalization (including imaging, microbiology, ancillary and laboratory) are listed below for reference.    Significant Diagnostic Studies: Mr Lumbar Spine Wo Contrast  Result Date: 09/28/2017 CLINICAL DATA:  Initial evaluation for recent increase in lower back pain. Urinary retention with constipation. History of lymphoma, CHF, renal failure with ascites. EXAM: MRI LUMBAR SPINE WITHOUT CONTRAST MRI SACRUM WITHOUT CONTRAST TECHNIQUE: Multiplanar, multisequence MR imaging of the lumbar spine and sacrum was performed. No intravenous contrast was administered. COMPARISON:  Prior CT from 09/27/2017 FINDINGS: MRI LUMBAR SPINE: Segmentation: Examination severely limited due to poor signal strain due to patient's extensive ascites. Multiple sequences are fairly limited and essentially nondiagnostic in nature, limiting evaluation of the spine. Normal segmentation. Lowest well-formed disc labeled the L5-S1 level. Alignment: Vertebral bodies normally aligned with preservation of the normal lumbar lordosis. No listhesis or subluxation. Vertebrae: Vertebral body heights are grossly maintained without appreciable acute or chronic fracture. Mild height loss at the superior endplate of L3 favored to be degenerative in nature. Bone marrow signal intensity poorly evaluated on this exam due to technical limitations. No obvious discrete or focal osseous lesion. Conus medullaris and cauda equina: Conus extends to approximately the T12-L1 level. No evidence for cord  compression. Cauda equina in conus medullaris appear grossly within normal limits. Paraspinal and other soft tissues: Paraspinous soft tissues demonstrate no obvious acute abnormality. Diffuse anasarca noted. Intra-abdominal ascites better evaluated on prior CT. Partially visualized visceral structures not well assessed on this examination. Disc levels: T12-L1: Mild diffuse disc bulge with disc desiccation. Superimposed left paracentral disc protrusion mildly indents the left ventral thecal sac (series 4, image 7). No significant stenosis. Foramina remain patent. L1-2:  Unremarkable. L2-3: Suspected mild disc desiccation without significant disc bulge. No appreciable stenosis. L3-4:  Grossly unremarkable.  No appreciable stenosis. L4-5: Mild disc bulge with disc desiccation. Suspected mild facet hypertrophy. Resultant mild canal with bilateral lateral recess narrowing. Probable mild bilateral foraminal stenosis. L5-S1:  Mild facet hypertrophy.  No appreciable stenosis. MRI SACRUM: Examination severely limited due to extensive ascites and resultant poor signal strength. Multiple sequences are nearly nondiagnostic. Sacrum grossly intact without obvious acute abnormality. SI joints approximated and symmetric. Visualized bones of the pelvis demonstrate no obvious abnormality. Bone marrow signal intensity poorly evaluated on this exam due to technical limitations. No obvious discrete osseous lesion. Extensive ascites noted within the visualized abdomen and pelvis. Large volume retained stool within the distal colon and rectal vault, suggesting constipation. Remainder of the pelvis poorly evaluated on this exam. Diffuse anasarca noted.  No other obvious soft tissue abnormality. IMPRESSION: MRI LUMBAR SPINE IMPRESSION 1. Severely limited study due to extensive ascites and resultant poor signal strength. 2. No evidence for cord compression or significant spinal stenosis within the lumbar spine. No obvious acute abnormality.  3. Left paracentral disc protrusion at T12-L1 without significant stenosis. 4. Mild disc bulge and facet hypertrophy at L4-5 with resultant mild spinal stenosis. 5. Large volume ascites, better evaluated on recent CT. MRI SACRUM IMPRESSION 1. Severely limited study due to extensive ascites and resultant poor signal strength. Multiple sequences are essentially nondiagnostic. 2. No obvious acute abnormality about the sacrum. 3. Large volume retained stool within the distal colon and rectal vault, suggesting constipation. 4. Large volume ascites, better evaluated on recent CT. Electronically Signed   By: Jeannine Boga M.D.   On: 09/28/2017 19:32   US Renal  Result Date: 09/27/2017 CLINICAL DATA:  Acute kidney injury. History of diabetes, hypertension.  EXAM: RENAL / URINARY TRACT ULTRASOUND COMPLETE COMPARISON:  CT abdomen and pelvis August 31, 2017 FINDINGS: Right Kidney: Length: 12 cm. Increased parenchymal echogenicity. Mild hydronephrosis. No suspicious mass. Left Kidney: Length: 9.8 cm. Increased parenchymal echogenicity. No suspicious mass or hydronephrosis visualized. Bladder: Foley catheter. Anechoic free fluid.  Nodular liver.  Anasarca. IMPRESSION: 1. Echogenic kidneys compatible with medical renal disease. 2. Mild RIGHT hydronephrosis. 3. Cirrhosis and ascites. Electronically Signed   By: Elon Alas M.D.   On: 09/27/2017 15:52   Mr Sacrum Si Joints Wo Contrast  Result Date: 09/28/2017 CLINICAL DATA:  Initial evaluation for recent increase in lower back pain. Urinary retention with constipation. History of lymphoma, CHF, renal failure with ascites. EXAM: MRI LUMBAR SPINE WITHOUT CONTRAST MRI SACRUM WITHOUT CONTRAST TECHNIQUE: Multiplanar, multisequence MR imaging of the lumbar spine and sacrum was performed. No intravenous contrast was administered. COMPARISON:  Prior CT from 09/27/2017 FINDINGS: MRI LUMBAR SPINE: Segmentation: Examination severely limited due to poor signal strain due to  patient's extensive ascites. Multiple sequences are fairly limited and essentially nondiagnostic in nature, limiting evaluation of the spine. Normal segmentation. Lowest well-formed disc labeled the L5-S1 level. Alignment: Vertebral bodies normally aligned with preservation of the normal lumbar lordosis. No listhesis or subluxation. Vertebrae: Vertebral body heights are grossly maintained without appreciable acute or chronic fracture. Mild height loss at the superior endplate of L3 favored to be degenerative in nature. Bone marrow signal intensity poorly evaluated on this exam due to technical limitations. No obvious discrete or focal osseous lesion. Conus medullaris and cauda equina: Conus extends to approximately the T12-L1 level. No evidence for cord compression. Cauda equina in conus medullaris appear grossly within normal limits. Paraspinal and other soft tissues: Paraspinous soft tissues demonstrate no obvious acute abnormality. Diffuse anasarca noted. Intra-abdominal ascites better evaluated on prior CT. Partially visualized visceral structures not well assessed on this examination. Disc levels: T12-L1: Mild diffuse disc bulge with disc desiccation. Superimposed left paracentral disc protrusion mildly indents the left ventral thecal sac (series 4, image 7). No significant stenosis. Foramina remain patent. L1-2:  Unremarkable. L2-3: Suspected mild disc desiccation without significant disc bulge. No appreciable stenosis. L3-4:  Grossly unremarkable.  No appreciable stenosis. L4-5: Mild disc bulge with disc desiccation. Suspected mild facet hypertrophy. Resultant mild canal with bilateral lateral recess narrowing. Probable mild bilateral foraminal stenosis. L5-S1:  Mild facet hypertrophy.  No appreciable stenosis. MRI SACRUM: Examination severely limited due to extensive ascites and resultant poor signal strength. Multiple sequences are nearly nondiagnostic. Sacrum grossly intact without obvious acute  abnormality. SI joints approximated and symmetric. Visualized bones of the pelvis demonstrate no obvious abnormality. Bone marrow signal intensity poorly evaluated on this exam due to technical limitations. No obvious discrete osseous lesion. Extensive ascites noted within the visualized abdomen and pelvis. Large volume retained stool within the distal colon and rectal vault, suggesting constipation. Remainder of the pelvis poorly evaluated on this exam. Diffuse anasarca noted.  No other obvious soft tissue abnormality. IMPRESSION: MRI LUMBAR SPINE IMPRESSION 1. Severely limited study due to extensive ascites and resultant poor signal strength. 2. No evidence for cord compression or significant spinal stenosis within the lumbar spine. No obvious acute abnormality. 3. Left paracentral disc protrusion at T12-L1 without significant stenosis. 4. Mild disc bulge and facet hypertrophy at L4-5 with resultant mild spinal stenosis. 5. Large volume ascites, better evaluated on recent CT. MRI SACRUM IMPRESSION 1. Severely limited study due to extensive ascites and resultant poor signal strength. Multiple sequences are essentially  nondiagnostic. 2. No obvious acute abnormality about the sacrum. 3. Large volume retained stool within the distal colon and rectal vault, suggesting constipation. 4. Large volume ascites, better evaluated on recent CT. Electronically Signed   By: Jeannine Boga M.D.   On: 09/28/2017 19:32   Dg Chest Port 1 View  Result Date: 09/27/2017 CLINICAL DATA:  Renal failure.  History of lymphoma. EXAM: PORTABLE CHEST 1 VIEW COMPARISON:  Chest x-ray dated September 01, 2017. FINDINGS: Unchanged right chest wall port catheter. Stable cardiomegaly. Resolved pulmonary edema. Unchanged small left greater than right pleural effusions with left lower lobe atelectasis. New atelectasis in the medial right lung base. No pneumothorax. No acute osseous abnormality. IMPRESSION: 1. Unchanged left greater than right  pleural effusions. 2. Persistent left lower lobe consolidation/atelectasis. Increased medial right basilar atelectasis. 3. Resolved pulmonary edema. Electronically Signed   By: Titus Dubin M.D.   On: 09/27/2017 12:26   Ct Renal Stone Study  Result Date: 09/27/2017 CLINICAL DATA:  72 year old female with malignant lymphoma. Difficulty going to the bathroom for the past couple days. Subsequent encounter. EXAM: CT ABDOMEN AND PELVIS WITHOUT CONTRAST TECHNIQUE: Multidetector CT imaging of the abdomen and pelvis was performed following the standard protocol without IV contrast. COMPARISON:  09/27/2017 CT.  08/31/2017 CT.  07/22/2017 PET-CT. FINDINGS: Lower chest: Bibasilar atelectasis versus infiltrates with small pleural effusions greater on the left. Medial right middle lobe atelectasis/scarring. Cardiomegaly. Central line tip caval atrial junction. Coronary artery calcifications. Aortic valve calcifications. Probable anemia given density of cardiac contents. Hepatobiliary: Taking into account limitation by non contrast imaging, no focal hepatic lesion. Radiopaque material and gallbladder may represent sludge. Pancreas: Taking into account limitation by non contrast imaging, no focal pancreatic lesion. Spleen: Taking into account limitation by non contrast imaging, splenomegaly without focal lesion. Adrenals/Urinary Tract: Mild right-sided hydronephrosis. Etiology indeterminate as no obstructing calcified stone is noted. Bladder decompressed by Foley catheter. Taking into account limitation by non contrast imaging, no renal or adrenal mass. Tiny nonobstructing left renal calculus versus vascular calcification. Stomach/Bowel: Evaluation of bowel limited by ascites and third spacing of fluid. Contrast is seen throughout the colon suggesting no high-grade obstructing lesion. Stomach tenting along the anterior wall which may be related to prior gastrostomy placement. Small fat containing hernia with overlying skin  defect inferior to this level unchanged. Vascular/Lymphatic: Atherosclerotic changes aorta and aortic branch vessels. No abdominal aortic aneurysm. Increase number of normal size lymph nodes retroperitoneal and mesenteric region. Reproductive: No adnexal or uterine mass noted. Other: Moderate amount of ascites and third spacing of fluid. No free intraperitoneal air. Musculoskeletal: No acute osseous abnormality. IMPRESSION: Mild right-sided hydronephrosis. Etiology indeterminate as no obstructing calcified stone is noted. Contrast throughout colon without evidence of obstruction. Ascites and third spacing of fluid. Splenomegaly. Bibasilar atelectasis/infiltrates with small pleural effusions. Cardiomegaly. Aortic Atherosclerosis (ICD10-I70.0). Electronically Signed   By: Genia Del M.D.   On: 09/27/2017 17:08    Microbiology: Recent Results (from the past 240 hour(s))  Urine culture     Status: None   Collection Time: 09/27/17  2:03 PM  Result Value Ref Range Status   Specimen Description   Final    URINE, CLEAN CATCH Performed at [facility] [address], [place], [place] [address]    Special Requests   Final    Normal Performed at [facility], [place] [address] [place], [place] [address]    Culture   Final    NO GROWTH Performed at [facility], [place] [address], [place], [place] [address]    Report Status 09/29/2017 FINAL  Final     Labs: Basic Metabolic Panel: Recent Labs  Lab 09/30/17 0554 10/01/17 0332 10/02/17 0507 10/03/17 0500 10/04/17 0352 10/05/17 0447  NA 135 137 136 132* 132* 136  K 3.1* 3.4* 3.4* 3.7 4.1 4.1  CL 105 108 108 105 104 106  CO2 21* 22 23 22 22 24   GLUCOSE 124* 111* 115* 113* 107* 116*  BUN 46* 40* 36* 35* 33* 31*  CREATININE 2.64* 2.08* 1.65* 1.30* 1.20* 1.10*  CALCIUM 7.8* 7.9* 7.7* 7.5* 7.5* 7.8*  MG  --   --  1.9  --   --   --   PHOS 4.0  --   --   --   --   --    Liver Function Tests: Recent  Labs  Lab 09/30/17 0554 10/01/17 0332 10/02/17 0507 10/03/17 0500 10/05/17 0447  AST 12* 13* 14* 14* 17  ALT 9 10 10 9 11   ALKPHOS 52 57 61 55 62  BILITOT 0.4 0.5 0.3 0.5 0.3  PROT 4.5* 4.5* 4.1* 4.1* 4.3*  ALBUMIN 2.4* 2.1* 1.9* 1.9* 2.0*   No results for input(s): LIPASE, AMYLASE in the last 168 hours. No results for input(s): AMMONIA in the last 168 hours. CBC: Recent Labs  Lab 09/29/17 0550 09/29/17 1256 10/01/17 0332 10/02/17 0507 10/03/17 0500 10/04/17 0352 10/05/17 0447  WBC 1.6* 1.7* 2.3* 2.7* 2.6* 3.7* 4.4  NEUTROABS 0.6* 0.8* 1.1*  --   --  2.5  --   HGB 8.2* 8.8* 8.5* 7.8* 7.7* 8.0* 7.8*  HCT 24.3* 25.6* 24.7* 23.0* 22.9* 24.1* 23.3*  MCV 82.1 82.1 82.3 82.7 83.3 84.0 83.5  PLT 147* 155 132* 104* 103* 117* 114*   Cardiac Enzymes: No results for input(s): CKTOTAL, CKMB, CKMBINDEX, TROPONINI in the last 168 hours. BNP: BNP (last 3 results) Recent Labs    10/27/16 1050 08/14/17 0901  BNP 72.8 305.3*    ProBNP (last 3 results) No results for input(s): PROBNP in the last 8760 hours.  CBG: Recent Labs  Lab 09/28/17 2107 10/02/17 2237  GLUCAP 140* 132*       Signed:  Irine Seal MD.  [facility] 10/05/2017, 3:06 PM

## 2017-10-05 NOTE — Clinical Social Work Placement (Signed)
Patient received and accepted bed offer at [facility]. Facility aware of patient's discharge and confirmed bed offer. [facility] contacted, patient's family notified. Patient's RN can call report to [phone], packet complete. CSW signing off, no other needs identified at this time.  CLINICAL SOCIAL WORK PLACEMENT  NOTE  Date:  10/05/2017  Patient Details  Name: Emily Kennedy MRN: [id number] Date of Birth: Jul 16, 1945  Clinical Social Work is seeking post-discharge placement for this patient at the [place] level of care (*CSW will initial, date and re-position this form in  chart as items are completed):  Yes   Patient/family provided with [facility] Work Department's list of facilities offering this level of care within the geographic area requested by the patient (or if unable, by the patient's family).  Yes   Patient/family informed of their freedom to choose among providers that offer the needed level of care, that participate in Medicare, Medicaid or managed care program needed by the patient, have an available bed and are willing to accept the patient.  Yes   Patient/family informed of [facility]'s ownership interest in [facility] and [facility], as well as of the fact that they are under no obligation to receive care at these facilities.  PASRR submitted to EDS on       PASRR number received on       Existing PASRR number confirmed on 09/29/17     FL2 transmitted to all facilities in geographic area requested by pt/family on 09/29/17     FL2 transmitted to all facilities within larger geographic area on       Patient informed that his/her managed care company has contracts with or will negotiate with certain facilities, including the following:        Yes   Patient/family informed of bed offers received.  Patient chooses bed at [facility]     Physician recommends and patient chooses bed at      Patient to be transferred  to [facility] on 10/05/17.  Patient to be transferred to facility by [facility]     Patient family notified on 10/05/17 of transfer.  Name of family member notified:  [facility]     PHYSICIAN       Additional Comment:    _______________________________________________ Burnis Medin, LCSW 10/05/2017, 3:43 PM

## 2017-10-10 ENCOUNTER — Other Ambulatory Visit: Payer: Self-pay

## 2017-10-10 ENCOUNTER — Inpatient Hospital Stay (HOSPITAL_COMMUNITY)
Admission: EM | Admit: 2017-10-10 | Discharge: 2017-10-14 | DRG: 698 | Disposition: A | Discharge status: Hospice | Payer: Medicare Other | Attending: Internal Medicine | Admitting: Internal Medicine

## 2017-10-10 ENCOUNTER — Emergency Department (HOSPITAL_COMMUNITY): Payer: Medicare Other

## 2017-10-10 DIAGNOSIS — Z833 Family history of diabetes mellitus: Secondary | ICD-10-CM | POA: Diagnosis not present

## 2017-10-10 DIAGNOSIS — Z8701 Personal history of pneumonia (recurrent): Secondary | ICD-10-CM | POA: Diagnosis not present

## 2017-10-10 DIAGNOSIS — N390 Urinary tract infection, site not specified: Secondary | ICD-10-CM | POA: Diagnosis present

## 2017-10-10 DIAGNOSIS — I130 Hypertensive heart and chronic kidney disease with heart failure and stage 1 through stage 4 chronic kidney disease, or unspecified chronic kidney disease: Secondary | ICD-10-CM | POA: Diagnosis present

## 2017-10-10 DIAGNOSIS — R188 Other ascites: Secondary | ICD-10-CM | POA: Diagnosis present

## 2017-10-10 DIAGNOSIS — Y846 Urinary catheterization as the cause of abnormal reaction of the patient, or of later complication, without mention of misadventure at the time of the procedure: Secondary | ICD-10-CM | POA: Diagnosis present

## 2017-10-10 DIAGNOSIS — D472 Monoclonal gammopathy: Secondary | ICD-10-CM | POA: Diagnosis present

## 2017-10-10 DIAGNOSIS — I509 Heart failure, unspecified: Secondary | ICD-10-CM | POA: Diagnosis present

## 2017-10-10 DIAGNOSIS — J181 Lobar pneumonia, unspecified organism: Secondary | ICD-10-CM | POA: Diagnosis present

## 2017-10-10 DIAGNOSIS — Z79899 Other long term (current) drug therapy: Secondary | ICD-10-CM

## 2017-10-10 DIAGNOSIS — D469 Myelodysplastic syndrome, unspecified: Secondary | ICD-10-CM | POA: Diagnosis present

## 2017-10-10 DIAGNOSIS — Y95 Nosocomial condition: Secondary | ICD-10-CM | POA: Diagnosis present

## 2017-10-10 DIAGNOSIS — Z85820 Personal history of malignant melanoma of skin: Secondary | ICD-10-CM | POA: Diagnosis not present

## 2017-10-10 DIAGNOSIS — R161 Splenomegaly, not elsewhere classified: Secondary | ICD-10-CM | POA: Diagnosis present

## 2017-10-10 DIAGNOSIS — N183 Chronic kidney disease, stage 3 (moderate): Secondary | ICD-10-CM | POA: Diagnosis present

## 2017-10-10 DIAGNOSIS — T83518A Infection and inflammatory reaction due to other urinary catheter, initial encounter: Secondary | ICD-10-CM | POA: Diagnosis not present

## 2017-10-10 DIAGNOSIS — Z882 Allergy status to sulfonamides status: Secondary | ICD-10-CM

## 2017-10-10 DIAGNOSIS — E039 Hypothyroidism, unspecified: Secondary | ICD-10-CM | POA: Diagnosis present

## 2017-10-10 DIAGNOSIS — Z66 Do not resuscitate: Secondary | ICD-10-CM | POA: Diagnosis present

## 2017-10-10 DIAGNOSIS — Z79890 Hormone replacement therapy (postmenopausal): Secondary | ICD-10-CM

## 2017-10-10 DIAGNOSIS — K7460 Unspecified cirrhosis of liver: Secondary | ICD-10-CM | POA: Diagnosis present

## 2017-10-10 DIAGNOSIS — C8300 Small cell B-cell lymphoma, unspecified site: Secondary | ICD-10-CM | POA: Diagnosis present

## 2017-10-10 DIAGNOSIS — M069 Rheumatoid arthritis, unspecified: Secondary | ICD-10-CM | POA: Diagnosis present

## 2017-10-10 DIAGNOSIS — Z888 Allergy status to other drugs, medicaments and biological substances status: Secondary | ICD-10-CM

## 2017-10-10 DIAGNOSIS — E785 Hyperlipidemia, unspecified: Secondary | ICD-10-CM | POA: Diagnosis present

## 2017-10-10 DIAGNOSIS — E1122 Type 2 diabetes mellitus with diabetic chronic kidney disease: Secondary | ICD-10-CM | POA: Diagnosis present

## 2017-10-10 DIAGNOSIS — Z7401 Bed confinement status: Secondary | ICD-10-CM | POA: Diagnosis not present

## 2017-10-10 DIAGNOSIS — A419 Sepsis, unspecified organism: Secondary | ICD-10-CM | POA: Diagnosis present

## 2017-10-10 DIAGNOSIS — Z7951 Long term (current) use of inhaled steroids: Secondary | ICD-10-CM

## 2017-10-10 DIAGNOSIS — Z515 Encounter for palliative care: Secondary | ICD-10-CM | POA: Diagnosis not present

## 2017-10-10 DIAGNOSIS — Z881 Allergy status to other antibiotic agents status: Secondary | ICD-10-CM

## 2017-10-10 DIAGNOSIS — Z809 Family history of malignant neoplasm, unspecified: Secondary | ICD-10-CM

## 2017-10-10 DIAGNOSIS — Z7189 Other specified counseling: Secondary | ICD-10-CM | POA: Diagnosis not present

## 2017-10-10 DIAGNOSIS — J189 Pneumonia, unspecified organism: Secondary | ICD-10-CM | POA: Diagnosis present

## 2017-10-10 DIAGNOSIS — D696 Thrombocytopenia, unspecified: Secondary | ICD-10-CM | POA: Diagnosis not present

## 2017-10-10 DIAGNOSIS — D638 Anemia in other chronic diseases classified elsewhere: Secondary | ICD-10-CM | POA: Diagnosis present

## 2017-10-10 DIAGNOSIS — R509 Fever, unspecified: Secondary | ICD-10-CM | POA: Diagnosis present

## 2017-10-10 LAB — COMPREHENSIVE METABOLIC PANEL
ALT: 13 U/L (ref 0–44)
AST: 17 U/L (ref 15–41)
Albumin: 2 g/dL — ABNORMAL LOW (ref 3.5–5.0)
Alkaline Phosphatase: 79 U/L (ref 38–126)
Anion gap: 5 (ref 5–15)
BUN: 30 mg/dL — AB (ref 8–23)
CHLORIDE: 105 mmol/L (ref 98–111)
CO2: 23 mmol/L (ref 22–32)
CREATININE: 1.22 mg/dL — AB (ref 0.44–1.00)
Calcium: 7.6 mg/dL — ABNORMAL LOW (ref 8.9–10.3)
GFR calc Af Amer: 51 mL/min — ABNORMAL LOW (ref >60)
GFR, EST NON AFRICAN AMERICAN: 44 mL/min — AB (ref >60)
Glucose, Bld: 118 mg/dL — ABNORMAL HIGH (ref 70–99)
Potassium: 4.8 mmol/L (ref 3.5–5.1)
Sodium: 133 mmol/L — ABNORMAL LOW (ref 135–145)
Total Bilirubin: 0.2 mg/dL — ABNORMAL LOW (ref 0.3–1.2)
Total Protein: 4.4 g/dL — ABNORMAL LOW (ref 6.5–8.1)

## 2017-10-10 LAB — CBC WITH DIFFERENTIAL/PLATELET
Basophils Absolute: 0 10*3/uL (ref 0.0–0.1)
Basophils Relative: 0 %
EOS ABS: 0 10*3/uL (ref 0.0–0.7)
EOS PCT: 0 %
HCT: 31.1 % — ABNORMAL LOW (ref 36.0–46.0)
Hemoglobin: 10.3 g/dL — ABNORMAL LOW (ref 12.0–15.0)
LYMPHS ABS: 0.6 10*3/uL — AB (ref 0.7–4.0)
Lymphocytes Relative: 7 %
MCH: 28.3 pg (ref 26.0–34.0)
MCHC: 33.1 g/dL (ref 30.0–36.0)
MCV: 85.4 fL (ref 78.0–100.0)
MONO ABS: 0.3 10*3/uL (ref 0.1–1.0)
MONOS PCT: 4 %
Neutro Abs: 7.2 10*3/uL (ref 1.7–7.7)
Neutrophils Relative %: 89 %
PLATELETS: 99 10*3/uL — AB (ref 150–400)
RBC: 3.64 MIL/uL — AB (ref 3.87–5.11)
RDW: 15.9 % — AB (ref 11.5–15.5)
WBC: 8.2 10*3/uL (ref 4.0–10.5)

## 2017-10-10 LAB — URINALYSIS, ROUTINE W REFLEX MICROSCOPIC
Bilirubin Urine: NEGATIVE
GLUCOSE, UA: 50 mg/dL — AB
Ketones, ur: NEGATIVE mg/dL
Nitrite: POSITIVE — AB
PH: 5 (ref 5.0–8.0)
Protein, ur: 100 mg/dL — AB
RBC / HPF: >50 RBC/hpf — ABNORMAL HIGH (ref 0–5)
SPECIFIC GRAVITY, URINE: 1.015 (ref 1.005–1.030)
WBC, UA: >50 WBC/hpf — ABNORMAL HIGH (ref 0–5)

## 2017-10-10 LAB — LIPASE, BLOOD: Lipase: 25 U/L (ref 11–51)

## 2017-10-10 MED ORDER — IPRATROPIUM-ALBUTEROL 0.5-2.5 (3) MG/3ML IN SOLN
3.0000 mL | Freq: Two times a day (BID) | RESPIRATORY_TRACT | Status: DC
  Administered 2017-10-10 – 2017-10-11 (×2): 3 mL via RESPIRATORY_TRACT
  Filled 2017-10-10 (×2): qty 3

## 2017-10-10 MED ORDER — ONDANSETRON HCL 4 MG/2ML IJ SOLN
4.0000 mg | Freq: Four times a day (QID) | INTRAMUSCULAR | Status: DC | PRN
  Administered 2017-10-11: 4 mg via INTRAVENOUS
  Filled 2017-10-10: qty 2

## 2017-10-10 MED ORDER — FAMOTIDINE 20 MG PO TABS
20.0000 mg | ORAL_TABLET | Freq: Two times a day (BID) | ORAL | Status: DC
  Administered 2017-10-10 – 2017-10-13 (×7): 20 mg via ORAL
  Filled 2017-10-10 (×7): qty 1

## 2017-10-10 MED ORDER — SODIUM CHLORIDE 0.9 % IV SOLN
1500.0000 mg | INTRAVENOUS | Status: AC
  Administered 2017-10-10: 1500 mg via INTRAVENOUS
  Filled 2017-10-10: qty 1500

## 2017-10-10 MED ORDER — ONDANSETRON HCL 4 MG PO TABS: 4.0000 mg | ORAL_TABLET | Freq: Four times a day (QID) | ORAL | Status: DC | PRN

## 2017-10-10 MED ORDER — SODIUM CHLORIDE 0.9% FLUSH: 3.0000 mL | INTRAVENOUS | Status: DC | PRN

## 2017-10-10 MED ORDER — LEVOTHYROXINE SODIUM 112 MCG PO TABS
224.0000 ug | ORAL_TABLET | Freq: Every day | ORAL | Status: DC
  Administered 2017-10-11 – 2017-10-13 (×3): 224 ug via ORAL
  Filled 2017-10-10 (×3): qty 2

## 2017-10-10 MED ORDER — SODIUM CHLORIDE 0.9 % IV SOLN
2.0000 g | Freq: Once | INTRAVENOUS | Status: AC
  Administered 2017-10-10: 2 g via INTRAVENOUS
  Filled 2017-10-10: qty 2

## 2017-10-10 MED ORDER — OXYCODONE HCL 5 MG PO TABS
10.0000 mg | ORAL_TABLET | Freq: Four times a day (QID) | ORAL | Status: DC | PRN
  Administered 2017-10-11 – 2017-10-12 (×5): 10 mg via ORAL
  Filled 2017-10-10 (×5): qty 2

## 2017-10-10 MED ORDER — ACETAMINOPHEN 650 MG RE SUPP: 650.0000 mg | Freq: Four times a day (QID) | RECTAL | Status: DC | PRN

## 2017-10-10 MED ORDER — ACETAMINOPHEN 325 MG PO TABS: 650.0000 mg | ORAL_TABLET | Freq: Four times a day (QID) | ORAL | Status: DC | PRN

## 2017-10-10 MED ORDER — FERROUS SULFATE 325 (65 FE) MG PO TABS
325.0000 mg | ORAL_TABLET | Freq: Two times a day (BID) | ORAL | Status: DC
  Administered 2017-10-10 – 2017-10-13 (×7): 325 mg via ORAL
  Filled 2017-10-10 (×7): qty 1

## 2017-10-10 MED ORDER — PRO-STAT SUGAR FREE PO LIQD
30.0000 mL | Freq: Two times a day (BID) | ORAL | Status: DC
  Administered 2017-10-11 – 2017-10-13 (×5): 30 mL via ORAL
  Filled 2017-10-10 (×4): qty 30

## 2017-10-10 MED ORDER — SODIUM CHLORIDE 0.9% FLUSH
3.0000 mL | Freq: Two times a day (BID) | INTRAVENOUS | Status: DC
  Administered 2017-10-11: 3 mL via INTRAVENOUS

## 2017-10-10 MED ORDER — CALCIUM CARBONATE ANTACID 500 MG PO CHEW
1.0000 | CHEWABLE_TABLET | Freq: Every day | ORAL | Status: DC
  Administered 2017-10-11 – 2017-10-13 (×3): 200 mg via ORAL
  Filled 2017-10-10 (×3): qty 1

## 2017-10-10 MED ORDER — PIPERACILLIN-TAZOBACTAM 3.375 G IVPB
3.3750 g | Freq: Three times a day (TID) | INTRAVENOUS | Status: DC
  Administered 2017-10-10 – 2017-10-14 (×11): 3.375 g via INTRAVENOUS
  Filled 2017-10-10 (×14): qty 50

## 2017-10-10 MED ORDER — AMLODIPINE BESYLATE 10 MG PO TABS
10.0000 mg | ORAL_TABLET | Freq: Every day | ORAL | Status: DC
  Administered 2017-10-11 – 2017-10-13 (×3): 10 mg via ORAL
  Filled 2017-10-10 (×3): qty 1

## 2017-10-10 MED ORDER — DILTIAZEM HCL 60 MG PO TABS
120.0000 mg | ORAL_TABLET | Freq: Three times a day (TID) | ORAL | Status: DC
  Administered 2017-10-10 – 2017-10-14 (×12): 120 mg via ORAL
  Filled 2017-10-10: qty 2
  Filled 2017-10-10 (×4): qty 4
  Filled 2017-10-10 (×4): qty 2
  Filled 2017-10-10: qty 4
  Filled 2017-10-10 (×3): qty 2
  Filled 2017-10-10: qty 4
  Filled 2017-10-10 (×3): qty 2
  Filled 2017-10-10: qty 4
  Filled 2017-10-10 (×2): qty 2
  Filled 2017-10-10 (×2): qty 4

## 2017-10-10 MED ORDER — SODIUM CHLORIDE 0.9 % IV SOLN
250.0000 mL | INTRAVENOUS | Status: DC | PRN
  Administered 2017-10-11 – 2017-10-13 (×2): 250 mL via INTRAVENOUS

## 2017-10-10 MED ORDER — ONDANSETRON HCL 4 MG/2ML IJ SOLN: 4.0000 mg | Freq: Four times a day (QID) | INTRAMUSCULAR | Status: DC | PRN

## 2017-10-10 MED ORDER — ENOXAPARIN SODIUM 40 MG/0.4ML ~~LOC~~ SOLN
40.0000 mg | SUBCUTANEOUS | Status: DC
  Administered 2017-10-10 – 2017-10-13 (×4): 40 mg via SUBCUTANEOUS
  Filled 2017-10-10 (×4): qty 0.4

## 2017-10-10 MED ORDER — VANCOMYCIN HCL IN DEXTROSE 750-5 MG/150ML-% IV SOLN
750.0000 mg | INTRAVENOUS | Status: DC
  Administered 2017-10-11: 750 mg via INTRAVENOUS
  Filled 2017-10-10: qty 150

## 2017-10-10 MED ORDER — HYDRALAZINE HCL 50 MG PO TABS
100.0000 mg | ORAL_TABLET | Freq: Three times a day (TID) | ORAL | Status: DC
  Administered 2017-10-10 – 2017-10-13 (×11): 100 mg via ORAL
  Filled 2017-10-10 (×11): qty 2

## 2017-10-10 MED ORDER — FLUTICASONE PROPIONATE 50 MCG/ACT NA SUSP
1.0000 | Freq: Every day | NASAL | Status: DC
  Filled 2017-10-10: qty 16

## 2017-10-10 MED ORDER — ALBUTEROL SULFATE (2.5 MG/3ML) 0.083% IN NEBU: 2.5000 mg | INHALATION_SOLUTION | RESPIRATORY_TRACT | Status: DC | PRN

## 2017-10-10 MED ORDER — GLUCERNA SHAKE PO LIQD
237.0000 mL | Freq: Three times a day (TID) | ORAL | Status: DC
  Administered 2017-10-10 – 2017-10-13 (×2): 237 mL via ORAL
  Filled 2017-10-10 (×12): qty 237

## 2017-10-10 NOTE — Progress Notes (Signed)
A consult was received from an ED physician for Vancomycin per pharmacy dosing.  The patient's profile has been reviewed for ht/wt/allergies/indication/available labs.   A one time order has been placed for Vancomycin 1500mg  IV.  Further antibiotics/pharmacy consults should be ordered by admitting physician if indicated.                       Thank you, Everette Rank, PharmD 10/10/2017  2:01 PM

## 2017-10-10 NOTE — H&P (Signed)
History and Physical    Emily Kennedy  TMH:[id number]  DOB: 13-Jan-1946  DOA: 10/10/2017 PCP: [name], MD   Patient coming from: SNF- [facility]  Chief Complaint: sent from SNF for fever  HPI: Emily Kennedy is a 72 y.o. female with medical history of DM2, myelodysplastic syndrome, lymphoplasmocytic lymphoma, MGUS,  rheumatoid arthritis, HTN, HLD, CKD 3, chronic lymphedema, hypothyroidism who lives in a SNF. She is sent for fever of 101.8 and nausea and vomiting for 2 days. The patient is a terrible historian and cannot remember why she is here. She only states she feels sick. Her daughter does not know about much of what has been going on at the SNF. She is not sure if the patient had a voiding trial. She states Dr Irene Limbo has stopped the chemo completely. The patient states she barely eats solid food but does drink fluids. She is not sure if she has had a cough or respiratory symptoms. She can recall the nausea and vomiting but states there has been no diarrhea or abdominal pain.   Recently admitted from 09/27/17 through 10/05/17 for AKI, urinary retention and was discharged with a foley cath. Torsemide was discontinued on d/c.   She was admitted from 08/31/17-09/06/17 for aspiration pneumonia, UTI.  Urine culture grew out multiple species on 6/19 and had no growth on 7/16.  ED Course:  UA > large leukocytes, large Hb, > 50 RBC and WBC, few bacteria - CXR > b/l pleural effusions and LLL consolidation  Review of Systems:  All other systems reviewed and apart from HPI, are negative.  Past Medical History:  Diagnosis Date  . CHF (congestive heart failure) ([place])   . Diabetes mellitus   . Hypertension   . Thyroid disease   . Vasculitis [facility])     Past Surgical History:  Procedure Laterality Date  . IR FLUORO GUIDE PORT INSERTION RIGHT  07/26/2017  . IR US GUIDE VASC ACCESS RIGHT  07/26/2017  . MELANOMA EXCISION    . TUBAL LIGATION      Social History:   reports that she has  never smoked. She has never used smokeless tobacco. She reports that she does not drink alcohol or use drugs.   She has been in a SNF since September 07, 2017 and lived with her daughter prior to this. She does not walk.   Allergies  Allergen Reactions  . Benadryl [Diphenhydramine] Swelling  . Ciprofloxacin Hives  . Furosemide Itching, Swelling, Rash and Other (See Comments)    Made edema worse  . Nitrofurantoin Monohyd Macro Hives  . Septra [Bactrim] Hives  . Sulfa Drugs Cross Reactors Hives  . Allopurinol Itching  . Ceclor [Cefaclor] Rash    Breaks out in rash chest down.    Family History  Problem Relation Age of Onset  . Cancer Sister   . Diabetes Mellitus II Mother   . Tuberculosis Father      Prior to Admission medications   Medication Sig Start Date End Date Taking? Authorizing Provider  acetaminophen (TYLENOL) 325 MG tablet Take 2 tablets (650 mg total) by mouth every 6 (six) hours as needed for fever. 09/19/15  Yes Theodis Blaze, MD  albuterol (PROVENTIL) (2.5 MG/3ML) 0.083% nebulizer solution Take 3 mLs (2.5 mg total) by nebulization every 4 (four) hours as needed for wheezing or shortness of breath. 09/06/17  Yes Georgette Shell, MD  amLODipine (NORVASC) 10 MG tablet Take 1 tablet (10 mg total) by mouth daily. 09/07/17  Yes Georgette Shell, MD  calcium carbonate (TUMS - DOSED IN MG ELEMENTAL CALCIUM) 500 MG chewable tablet Chew 1 tablet by mouth daily.   Yes [provider]  chlorhexidine (PERIDEX) 0.12 % solution Use as directed 15 mLs in the mouth or throat 2 (two) times daily.   Yes [provider]  cholecalciferol (VITAMIN D) 400 units TABS tablet Take 400 Units by mouth daily.   Yes [provider]  diltiazem (CARDIZEM) 120 MG tablet Take 1 tablet (120 mg total) by mouth every 8 (eight) hours. 09/06/17  Yes Georgette Shell, MD  famotidine (PEPCID) 20 MG tablet Take 1 tablet (20 mg total) by mouth 2 (two) times daily. 09/06/17  Yes  Georgette Shell, MD  ferrous sulfate 325 (65 FE) MG tablet Take 325 mg by mouth 2 (two) times daily with a meal.   Yes [provider]  fluticasone (FLONASE) 50 MCG/ACT nasal spray Place 1 spray into both nostrils daily. 09/07/17  Yes Georgette Shell, MD  hydrALAZINE (APRESOLINE) 100 MG tablet Take 100 mg by mouth 3 (three) times daily.   Yes [provider]  ipratropium-albuterol (DUONEB) 0.5-2.5 (3) MG/3ML SOLN Take 3 mLs by nebulization 2 (two) times daily. 09/06/17  Yes Georgette Shell, MD  lactulose [facility]) 10 GM/15ML solution Take 20 g by mouth 2 (two) times daily.   Yes [provider]  levothyroxine (SYNTHROID, LEVOTHROID) 112 MCG tablet Take 224 mcg by mouth daily before breakfast.    Yes [provider]  lidocaine-prilocaine (EMLA) cream Apply to affected area once Patient taking differently: Apply 1 application topically 2 (two) times daily.  07/24/17  Yes Perlov, Marinell Blight, MD  Menthol-Methyl Salicylate (MUSCLE RUB) 10-15 % CREA Apply 1 application topically as needed for muscle pain. 10/04/17  Yes Eugenie Filler, MD  Multiple Vitamin (MULTIVITAMIN) tablet Take 1 tablet by mouth daily.   Yes [provider]  Omega-3 Fatty Acids (FISH OIL) 1000 MG CAPS Take 4,000 mg by mouth daily.   Yes [provider]  ondansetron (ZOFRAN) 4 MG tablet Take 1 tablet (4 mg total) by mouth every 6 (six) hours as needed for nausea. Patient taking differently: Take 4 mg by mouth every 4 (four) hours.  09/06/17  Yes Georgette Shell, MD  Oxycodone HCl 10 MG TABS Take 1 tablet (10 mg total) by mouth every 6 (six) hours as needed for up to 14 days (pain). Patient taking differently: Take 10 mg by mouth every 6 (six) hours as needed (pain). Started 10/05/17 10/04/17 10/18/17 Yes Eugenie Filler, MD  senna (SENOKOT) 8.6 MG TABS tablet Take 2 tablets by mouth 2 (two) times daily as needed for mild constipation.   Yes [provider]   vitamin B-12 (CYANOCOBALAMIN) 500 MCG tablet Take 500 mcg by mouth daily.   Yes [provider]  Amino Acids-Protein Hydrolys (FEEDING SUPPLEMENT, PRO-STAT SUGAR FREE 64,) LIQD Take 30 mLs by mouth 2 (two) times daily between meals. Patient not taking: Reported on 10/10/2017 10/05/17   Eugenie Filler, MD  feeding supplement, GLUCERNA SHAKE, (GLUCERNA SHAKE) LIQD Take 237 mLs by mouth 3 (three) times daily between meals. Patient not taking: Reported on 10/10/2017 09/06/17   Georgette Shell, MD    Physical Exam: Wt Readings from Last 3 Encounters:  10/10/17 86.9 kg (191 lb 9.3 oz)  10/05/17 84 kg (185 lb 3 oz)  09/27/17 88.1 kg (194 lb 4.8 oz)   Vitals:   10/10/17 1221  10/10/17 1222 10/10/17 1436 10/10/17 1626  BP: (!) 143/66  (!) 147/68 (!) 163/72  Pulse: 95  (!) 102 (!) 106  Resp: 14  16 18   Temp: 98.3 F (36.8 C)  99.1 F (37.3 C) 99.4 F (37.4 C)  TempSrc: Oral  Oral Oral  SpO2: 94%  96% 97%  Weight:  83.9 kg (185 lb)  86.9 kg (191 lb 9.3 oz)  Height:  5\' 4"  (1.626 m)  5\' 4"  (1.626 m)      Constitutional:  Calm & comfortable Eyes: PERRLA, lids and conjunctivae normal ENT:  Mucous membranes are moist.  Pharynx clear of exudate   Normal dentition.  Neck: Supple, no masses  Respiratory:  Clear to auscultation bilaterally  Normal respiratory effort.  Cardiovascular:  S1 & S2 heard, regular rate and rhythm No Murmurs Abdomen:  Non distended No tenderness, No masses Bowel sounds normal Extremities:  No clubbing / cyanosis No pedal edema No joint deformity    Skin:  No rashes, lesions or ulcers Neurologic:  AAO x 3 CN 2-12 grossly intact Sensation intact Strength 5/5 in all 4 extremities Psychiatric:  Normal Mood and affect    Labs on Admission: I have personally reviewed following labs and imaging studies  CBC: Recent Labs  Lab 10/04/17 0352 10/05/17 0447 10/10/17 1227  WBC 3.7* 4.4 8.2  NEUTROABS 2.5  --  7.2  HGB 8.0* 7.8* 10.3*    HCT 24.1* 23.3* 31.1*  MCV 84.0 83.5 85.4  PLT 117* 114* 99*   Basic Metabolic Panel: Recent Labs  Lab 10/04/17 0352 10/05/17 0447 10/10/17 1227  NA 132* 136 133*  K 4.1 4.1 4.8  CL 104 106 105  CO2 22 24 23   GLUCOSE 107* 116* 118*  BUN 33* 31* 30*  CREATININE 1.20* 1.10* 1.22*  CALCIUM 7.5* 7.8* 7.6*   GFR: Estimated Creatinine Clearance: 45.8 mL/min (A) (by C-G formula based on SCr of 1.22 mg/dL (H)). Liver Function Tests: Recent Labs  Lab 10/05/17 0447 10/10/17 1227  AST 17 17  ALT 11 13  ALKPHOS 62 79  BILITOT 0.3 0.2*  PROT 4.3* 4.4*  ALBUMIN 2.0* 2.0*   Recent Labs  Lab 10/10/17 1227  LIPASE 25   No results for input(s): AMMONIA in the last 168 hours. Coagulation Profile: No results for input(s): INR, PROTIME in the last 168 hours. Cardiac Enzymes: No results for input(s): CKTOTAL, CKMB, CKMBINDEX, TROPONINI in the last 168 hours. BNP (last 3 results) No results for input(s): PROBNP in the last 8760 hours. HbA1C: No results for input(s): HGBA1C in the last 72 hours. CBG: No results for input(s): GLUCAP in the last 168 hours. Lipid Profile: No results for input(s): CHOL, HDL, LDLCALC, TRIG, CHOLHDL, LDLDIRECT in the last 72 hours. Thyroid Function Tests: No results for input(s): TSH, T4TOTAL, FREET4, T3FREE, THYROIDAB in the last 72 hours. Anemia Panel: No results for input(s): VITAMINB12, FOLATE, FERRITIN, TIBC, IRON, RETICCTPCT in the last 72 hours. Urine analysis:    Component Value Date/Time   COLORURINE AMBER (A) 10/10/2017 1228   APPEARANCEUR TURBID (A) 10/10/2017 1228   LABSPEC 1.015 10/10/2017 1228   PHURINE 5.0 10/10/2017 1228   GLUCOSEU 50 (A) 10/10/2017 1228   HGBUR LARGE (A) 10/10/2017 1228   BILIRUBINUR NEGATIVE 10/10/2017 1228   KETONESUR NEGATIVE 10/10/2017 1228   PROTEINUR 100 (A) 10/10/2017 1228   UROBILINOGEN 1.0 01/06/2011 2252   NITRITE POSITIVE (A) 10/10/2017 1228   LEUKOCYTESUR LARGE (A) 10/10/2017 1228   Sepsis  Labs: @LABRCNTIP (procalcitonin:4,lacticidven:4) )No  results found for this or any previous visit (from the past 240 hour(s)).   Radiological Exams on Admission: Dg Chest 2 View  Result Date: 10/10/2017 CLINICAL DATA:  Order for fever of 101.  Hx of HTN Diabetes CHF EXAM: CHEST - 2 VIEW COMPARISON:  09/27/2017 FINDINGS: Patient has a RIGHT-sided PowerPort, tip overlying the level of the LOWER superior vena cava or UPPER RIGHT atrium. The heart is enlarged. There is dense opacity at the LEFT lung base which obscures the hemidiaphragm. There is a LEFT pleural effusion. Small RIGHT pleural effusion is also present. IMPRESSION: 1. Cardiomegaly. 2. Bilateral pleural effusions and LEFT LOWER lobe consolidation. Electronically Signed   By: Nolon Nations M.D.   On: 10/10/2017 13:36       Assessment/Plan Principal Problem:   Fever - pyuria/ hematuria and LLL opacity - ? 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 per daughter  Bed bound for years  Severe pedal edema/ anasarca/ pleural effusions - has dressing with ACE wraps in place- her daughter is not sure how often it is changed   Anemia of chronic disease - Hb is 10.3 which is higher than her baseline-   CKD 3 - stable    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

## 2017-10-10 NOTE — Progress Notes (Signed)
Pharmacy Antibiotic Note  Emily Kennedy is a 72 y.o. female admitted from SNF on 10/10/2017 with sepsis. She has had recent admission for aspiration pneumonia, and UTI related to indwelling foley catheter.  Other PMH includes MDS with lymphoma (chemo Rituxan/Bendamustine last given on 6/18), CHF, DM, HTN,  Pharmacy has been consulted for Vancomycin and Zosyn dosing.  Noted abx allergies, but has recently tolerated Zosyn and Cefepime.  SCr 1.22, CrCl ~ 45 ml/min Tm 99.1 WBC 8.2  Plan:  Zosyn EI  Vancomycin 1500mg  IV x1 dose in ED, followed by 750 mg IV q24h.   Measure Vanc peak and trough at steady state.  Goal AUC 400 - 500   Follow up renal function, culture results, and clinical course.  F/u ability to de-escalate antibiotics.   Height: 5\' 4"  (162.6 cm) Weight: 185 lb (83.9 kg) IBW/kg (Calculated) : 54.7  Temp (24hrs), Avg:98.7 F (37.1 C), Min:98.3 F (36.8 C), Max:99.1 F (37.3 C)  Recent Labs  Lab 10/04/17 0352 10/05/17 0447 10/10/17 1227  WBC 3.7* 4.4 8.2  CREATININE 1.20* 1.10* 1.22*    Estimated Creatinine Clearance: 45 mL/min (A) (by C-G formula based on SCr of 1.22 mg/dL (H)).    Allergies  Allergen Reactions  . Benadryl [Diphenhydramine] Swelling  . Ciprofloxacin Hives  . Furosemide Itching, Swelling, Rash and Other (See Comments)    Made edema worse  . Nitrofurantoin Monohyd Macro Hives  . Septra [Bactrim] Hives  . Sulfa Drugs Cross Reactors Hives  . Allopurinol Itching  . Ceclor [Cefaclor] Rash    Breaks out in rash chest down.    Antimicrobials this admission: 7/29 Vancomycin >> 7/29 Zosyn >>   Dose adjustments this admission:   Microbiology results:  Thank you for allowing pharmacy to be a part of this patient's care.  Gretta Arab PharmD, BCPS Pager [phone] 10/10/2017 3:34 PM

## 2017-10-10 NOTE — ED Triage Notes (Signed)
Patient from [facility]. Patient with N/V x2 days. Temp 101.8, facility gave tylenol. 99 oral. Chronic Foley.   BP: 150/70 HR: 97 RR: 16 SPO2: 91%

## 2017-10-10 NOTE — ED Notes (Signed)
ED TO INPATIENT HANDOFF REPORT  Name/Age/Gender Emily Kennedy 72 y.o. female  Code Status    Code Status Orders  (From admission, onward)        Start     Ordered   10/10/17 1446  Do not attempt resuscitation (DNR)  Continuous    Question Answer Comment  In the event of cardiac or respiratory ARREST Do not call a "code blue"   In the event of cardiac or respiratory ARREST Do not perform Intubation, CPR, defibrillation or ACLS   In the event of cardiac or respiratory ARREST Use medication by any route, position, wound care, and other measures to relive pain and suffering. May use oxygen, suction and manual treatment of airway obstruction as needed for comfort.      10/10/17 1446    Code Status History    Date Active Date Inactive Code Status Order ID Comments User Context   09/27/2017 1851 10/05/2017 2230 DNR [id number]  [name], MD ED   08/31/2017 2151 09/07/2017 0028 DNR [id number]  Norval Morton, MD ED   02/14/2016 2017 02/16/2016 1902 Full Code [id number]  Domenic Polite, MD Inpatient   09/14/2015 2122 09/19/2015 1838 Full Code [id number]  Reubin Milan, MD Inpatient   06/23/2015 2309 07/20/2015 1755 Full Code [id number]  Corey Harold Inpatient   06/14/2015 0321 06/23/2015 2309 Full Code [id number]  Edwin Dada, MD ED   05/17/2015 2246 05/20/2015 1934 Full Code [id number]  Norval Morton, MD Inpatient   04/09/2015 1449 04/11/2015 1702 Full Code [id number]  Cristal Ford, DO Inpatient   04/09/2015 0557 04/09/2015 1449 DNR [id number]  Rise Patience, MD Inpatient    Advance Directive Documentation     Most Recent Value  Type of Advance Directive  Out of facility DNR (pink MOST or yellow form), Living will  Pre-existing out of facility DNR order (yellow form or pink MOST form)  Yellow form placed in chart (order not valid for inpatient use)  "MOST" Form in Place?  -      Home/SNF/Other Skilled nursing facility  Chief Complaint n-v  Level of Care/Admitting  Diagnosis ED Disposition    ED Disposition Condition [place]: [facility] [100102]  Level of Care: Med-Surg [16]  Diagnosis: Fever [010071]  Admitting Physician: [place], [place]  Attending Physician: Debbe Odea [[id number]]  Estimated length of stay: past midnight tomorrow  Certification:: I certify this patient will need inpatient services for at least 2 midnights  PT Class (Do Not Modify): Inpatient [101]  PT Acc Code (Do Not Modify): Private [1]       Medical History Past Medical History:  Diagnosis Date  . CHF (congestive heart failure) ([place])   . Diabetes mellitus   . Hypertension   . Thyroid disease   . Vasculitis (HCC)     Allergies Allergies  Allergen Reactions  . Benadryl [Diphenhydramine] Swelling  . Ciprofloxacin Hives  . Furosemide Itching, Swelling, Rash and Other (See Comments)    Made edema worse  . Nitrofurantoin Monohyd Macro Hives  . Septra [Bactrim] Hives  . Sulfa Drugs Cross Reactors Hives  . Allopurinol Itching  . Ceclor [Cefaclor] Rash    Breaks out in rash chest down.    IV Location/Drains/Wounds Patient Lines/Drains/Airways Status   Active Line/Drains/Airways    Name:   Placement date:   Placement time:   Site:   Days:   Implanted Port 07/26/17 Right Chest   07/26/17  1552    Chest   76   Urethral Catheter Willene Hatchet, RN 14 Fr.   10/05/17    1455    -   5          Labs/Imaging Results for orders placed or performed during the hospital encounter of 10/10/17 (from the past 48 hour(s))  Comprehensive metabolic panel     Status: Abnormal   Collection Time: 10/10/17 12:27 PM  Result Value Ref Range   Sodium 133 (L) 135 - 145 mmol/L   Potassium 4.8 3.5 - 5.1 mmol/L   Chloride 105 98 - 111 mmol/L   CO2 23 22 - 32 mmol/L   Glucose, Bld 118 (H) 70 - 99 mg/dL   BUN 30 (H) 8 - 23 mg/dL   Creatinine, Ser 1.22 (H) 0.44 - 1.00 mg/dL   Calcium 7.6 (L) 8.9 - 10.3 mg/dL   Total Protein 4.4 (L) 6.5 -  8.1 g/dL   Albumin 2.0 (L) 3.5 - 5.0 g/dL   AST 17 15 - 41 U/L   ALT 13 0 - 44 U/L   Alkaline Phosphatase 79 38 - 126 U/L   Total Bilirubin 0.2 (L) 0.3 - 1.2 mg/dL   GFR calc non Af Amer 44 (L) >60 mL/min   GFR calc Af Amer 51 (L) >60 mL/min    Comment: (NOTE) The eGFR has been calculated using the CKD EPI equation. This calculation has not been validated in all clinical situations. eGFR's persistently <60 mL/min signify possible Chronic Kidney Disease.    Anion gap 5 5 - 15    Comment: Performed at [facility], [place] [address], [place], [place] [address]  CBC with Differential     Status: Abnormal   Collection Time: 10/10/17 12:27 PM  Result Value Ref Range   WBC 8.2 4.0 - 10.5 K/uL   RBC 3.64 (L) 3.87 - 5.11 MIL/uL   Hemoglobin 10.3 (L) 12.0 - 15.0 g/dL   HCT 31.1 (L) 36.0 - 46.0 %   MCV 85.4 78.0 - 100.0 fL   MCH 28.3 26.0 - 34.0 pg   MCHC 33.1 30.0 - 36.0 g/dL   RDW 15.9 (H) 11.5 - 15.5 %   Platelets 99 (L) 150 - 400 K/uL    Comment: REPEATED TO VERIFY SPECIMEN CHECKED FOR CLOTS PLATELET COUNT CONFIRMED BY SMEAR    Neutrophils Relative % 89 %   Neutro Abs 7.2 1.7 - 7.7 K/uL   Lymphocytes Relative 7 %   Lymphs Abs 0.6 (L) 0.7 - 4.0 K/uL   Monocytes Relative 4 %   Monocytes Absolute 0.3 0.1 - 1.0 K/uL   Eosinophils Relative 0 %   Eosinophils Absolute 0.0 0.0 - 0.7 K/uL   Basophils Relative 0 %   Basophils Absolute 0.0 0.0 - 0.1 K/uL    Comment: Performed at [facility], [place] [address] [place], [place] [address]  Lipase, blood     Status: None   Collection Time: 10/10/17 12:27 PM  Result Value Ref Range   Lipase 25 11 - 51 U/L    Comment: Performed at [facility], [place] [address] [place], [place] [address]  Urinalysis, Routine w reflex microscopic     Status: Abnormal   Collection Time: 10/10/17 12:28 PM  Result Value Ref Range   Color, Urine AMBER (A) YELLOW    Comment: BIOCHEMICALS MAY BE AFFECTED BY  COLOR   APPearance TURBID (A) CLEAR   Specific Gravity, Urine 1.015 1.005 - 1.030  pH 5.0 5.0 - 8.0   Glucose, UA 50 (A) NEGATIVE mg/dL   Hgb urine dipstick LARGE (A) NEGATIVE   Bilirubin Urine NEGATIVE NEGATIVE   Ketones, ur NEGATIVE NEGATIVE mg/dL   Protein, ur 100 (A) NEGATIVE mg/dL   Nitrite POSITIVE (A) NEGATIVE   Leukocytes, UA LARGE (A) NEGATIVE   RBC / HPF >50 (H) 0 - 5 RBC/hpf   WBC, UA >50 (H) 0 - 5 WBC/hpf   Bacteria, UA FEW (A) NONE SEEN   WBC Clumps PRESENT    Granular Casts, UA PRESENT     Comment: Performed at [facility], [place] [address] [place], [place] [address]   Dg Chest 2 View  Result Date: 10/10/2017 CLINICAL DATA:  Order for fever of 101.  Hx of HTN Diabetes CHF EXAM: CHEST - 2 VIEW COMPARISON:  09/27/2017 FINDINGS: Patient has a RIGHT-sided PowerPort, tip overlying the level of the LOWER superior vena cava or UPPER RIGHT atrium. The heart is enlarged. There is dense opacity at the LEFT lung base which obscures the hemidiaphragm. There is a LEFT pleural effusion. Small RIGHT pleural effusion is also present. IMPRESSION: 1. Cardiomegaly. 2. Bilateral pleural effusions and LEFT LOWER lobe consolidation. Electronically Signed   By: Nolon Nations M.D.   On: 10/10/2017 13:36    Pending Labs Unresulted Labs (From admission, onward)   Start     Ordered   10/17/17 0500  Creatinine, serum  (enoxaparin (LOVENOX)    CrCl >/= 30 ml/min)  Weekly,   R    Comments:  while on enoxaparin therapy    10/10/17 1446   10/11/17 0263  Basic metabolic panel  Tomorrow morning,   R     10/10/17 1446   10/11/17 0500  CBC  Tomorrow morning,   R     10/10/17 1446   10/10/17 1445  CBC  (enoxaparin (LOVENOX)    CrCl >/= 30 ml/min)  Once,   R    Comments:  Baseline for enoxaparin therapy IF NOT ALREADY DRAWN.  Notify MD if PLT < 100 K.    10/10/17 1446   10/10/17 1445  Creatinine, serum  (enoxaparin (LOVENOX)    CrCl >/= 30 ml/min)  Once,   R    Comments:   Baseline for enoxaparin therapy IF NOT ALREADY DRAWN.    10/10/17 1446      Vitals/Pain Today's Vitals   10/10/17 1221 10/10/17 1222 10/10/17 1436  BP: (!) 143/66  (!) 147/68  Pulse: 95  (!) 102  Resp: 14  16  Temp: 98.3 F (36.8 C)  99.1 F (37.3 C)  TempSrc: Oral  Oral  SpO2: 94%  96%  Weight:  185 lb (83.9 kg)   Height:  _0  (1.626 m)   PainSc:  5  3     Isolation Precautions No active isolations  Medications Medications  vancomycin (VANCOCIN) 1,500 mg in sodium chloride 0.9 % 500 mL IVPB (1,500 mg Intravenous New Bag/Given 10/10/17 1436)  enoxaparin (LOVENOX) injection 40 mg (has no administration in time range)  sodium chloride flush (NS) 0.9 % injection 3 mL (has no administration in time range)  sodium chloride flush (NS) 0.9 % injection 3 mL (has no administration in time range)  0.9 %  sodium chloride infusion (has no administration in time range)  acetaminophen (TYLENOL) tablet 650 mg (has no administration in time range)    Or  acetaminophen (TYLENOL) suppository 650 mg (has no administration in time range)  ondansetron (ZOFRAN) tablet  4 mg (has no administration in time range)    Or  ondansetron (ZOFRAN) injection 4 mg (has no administration in time range)  feeding supplement (GLUCERNA SHAKE) (GLUCERNA SHAKE) liquid 237 mL (has no administration in time range)  feeding supplement (PRO-STAT SUGAR FREE 64) liquid 30 mL (has no administration in time range)  albuterol (PROVENTIL) (2.5 MG/3ML) 0.083% nebulizer solution 2.5 mg (has no administration in time range)  amLODipine (NORVASC) tablet 10 mg (has no administration in time range)  calcium carbonate (TUMS - dosed in mg elemental calcium) chewable tablet 200 mg of elemental calcium (has no administration in time range)  diltiazem (CARDIZEM) tablet 120 mg (has no administration in time range)  famotidine (PEPCID) tablet 20 mg (has no administration in time range)  ferrous sulfate tablet 325 mg (has no  administration in time range)  fluticasone (FLONASE) 50 MCG/ACT nasal spray 1 spray (has no administration in time range)  hydrALAZINE (APRESOLINE) tablet 100 mg (has no administration in time range)  ipratropium-albuterol (DUONEB) 0.5-2.5 (3) MG/3ML nebulizer solution 3 mL (has no administration in time range)  levothyroxine (SYNTHROID, LEVOTHROID) tablet 224 mcg (has no administration in time range)  oxyCODONE (Oxy IR/ROXICODONE) immediate release tablet 10 mg (has no administration in time range)  aztreonam (AZACTAM) 2 g in sodium chloride 0.9 % 100 mL IVPB (0 g Intravenous Stopped 10/10/17 1436)    Mobility non-ambulatory  Report called to Burman Nieves, RN

## 2017-10-10 NOTE — ED Provider Notes (Signed)
[facility] DEPT Provider Note   CSN: [id number] Arrival date & time: 10/10/17  1155     History   Chief Complaint Chief Complaint  Patient presents with  . Nausea  . Emesis  . Fever    HPI Emily Kennedy is a 72 y.o. female.  HPI Patient presents with concern of generalized illness. She acknowledges multiple medical issues, including malignancy, but she does not know what type of cancer she has. She acknowledges recent hospitalization, states that she currently lives at home, but has been progressively weaker since recent hospitalization. No new focal pain, though she does has soreness all over. No confusion, disorientation, fever. Patient is not insightful about her medication regimen, aware of any particular changes. Since onset a few days ago, no clear alleviating or exacerbating factors. Level 5 caveat secondary to confusion.  Nursing reports the patient did have a fever, received Tylenol, and that she also also had nausea, vomiting for about 2 days.    Past Medical History:  Diagnosis Date  . CHF (congestive heart failure) ([place])   . Diabetes mellitus   . Hypertension   . Thyroid disease   . Vasculitis [facility])     Patient Active Problem List   Diagnosis Date Noted  . Constipation 10/04/2017  . Lymphedema 10/04/2017  . ARF (acute renal failure) ([place]) 09/27/2017  . Pancytopenia ([place]) 09/27/2017  . Acute lower UTI 09/27/2017  . Abdominal distension   . Anasarca 09/01/2017  . Thrombocytopenia ([place]) 09/01/2017  . Malignant lymphoma ([place]) 09/01/2017  . Fever 09/01/2017  . UTI (urinary tract infection) 08/31/2017  . 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 MDS (myelodysplastic syndrome) ([place]) 07/14/2017  . Solitary pulmonary nodule 02/15/2016  . CHF (congestive heart failure) ([place]) 02/14/2016  . Malnutrition of moderate degree 09/18/2015    . Benign essential HTN   . Vasculitis ([place])   . Rheumatoid arthritis ([place])   . History of recent hospitalization   . Acute blood loss anemia   . Anemia of chronic disease   . Hyponatremia 09/15/2015  . HCAP (healthcare-associated pneumonia) 09/14/2015  . Respiratory arrest ([place]) intubated 4/13 post arrest 06/27/2015  . Cardiac arrest ([place]) 4/13 suspected mucus plugging as cause of arrest 06/27/2015  . Controlled diabetes mellitus type 2 with complications ([place])   . Dysphagia   . Atrial fibrillation with RVR ([place]) 06/18/2015  . Chronic diastolic CHF (congestive heart failure) ([place])   . Acute encephalopathy 06/14/2015  . Anemia, chronic disease 05/18/2015  . Hypothyroidism 05/18/2015  . Essential hypertension 05/18/2015  . Diabetes mellitus type 2, uncontrolled ([place]) 04/09/2015  . 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 IR FLUORO GUIDE PORT INSERTION RIGHT  07/26/2017  . IR US GUIDE VASC ACCESS RIGHT  07/26/2017  . MELANOMA EXCISION    . TUBAL LIGATION       OB History   None      Home Medications    Prior to Admission medications   Medication Sig Start Date End Date Taking? Authorizing Provider  acetaminophen (TYLENOL) 325 MG tablet Take 2 tablets (650 mg total) by mouth every 6 (six) hours as needed for fever. 09/19/15  Yes Theodis Blaze, MD  albuterol (PROVENTIL) (2.5 MG/3ML) 0.083% nebulizer solution Take 3 mLs (2.5 mg total) by nebulization every 4 (four) hours as needed for wheezing or shortness of breath. 09/06/17  Yes Georgette Shell, MD  amLODipine (NORVASC) 10 MG tablet Take 1 tablet (10 mg total) by mouth daily. 09/07/17  Yes Georgette Shell, MD  calcium carbonate (TUMS - DOSED IN MG ELEMENTAL CALCIUM) 500 MG chewable tablet Chew 1 tablet by mouth daily.   Yes [provider]  chlorhexidine (PERIDEX) 0.12 % solution Use as directed 15 mLs in the mouth or throat 2 (two)  times daily.   Yes [provider]  cholecalciferol (VITAMIN D) 400 units TABS tablet Take 400 Units by mouth daily.   Yes [provider]  diltiazem (CARDIZEM) 120 MG tablet Take 1 tablet (120 mg total) by mouth every 8 (eight) hours. 09/06/17  Yes Georgette Shell, MD  famotidine (PEPCID) 20 MG tablet Take 1 tablet (20 mg total) by mouth 2 (two) times daily. 09/06/17  Yes Georgette Shell, MD  ferrous sulfate 325 (65 FE) MG tablet Take 325 mg by mouth 2 (two) times daily with a meal.   Yes [provider]  fluticasone (FLONASE) 50 MCG/ACT nasal spray Place 1 spray into both nostrils daily. 09/07/17  Yes Georgette Shell, MD  hydrALAZINE (APRESOLINE) 100 MG tablet Take 100 mg by mouth 3 (three) times daily.   Yes [provider]  ipratropium-albuterol (DUONEB) 0.5-2.5 (3) MG/3ML SOLN Take 3 mLs by nebulization 2 (two) times daily. 09/06/17  Yes Georgette Shell, MD  lactulose [facility]) 10 GM/15ML solution Take 20 g by mouth 2 (two) times daily.   Yes [provider]  levothyroxine (SYNTHROID, LEVOTHROID) 112 MCG tablet Take 224 mcg by mouth daily before breakfast.    Yes [provider]  lidocaine-prilocaine (EMLA) cream Apply to affected area once Patient taking differently: Apply 1 application topically 2 (two) times daily.  07/24/17  Yes Perlov, Marinell Blight, MD  Menthol-Methyl Salicylate (MUSCLE RUB) 10-15 % CREA Apply 1 application topically as needed for muscle pain. 10/04/17  Yes Eugenie Filler, MD  Multiple Vitamin (MULTIVITAMIN) tablet Take 1 tablet by mouth daily.   Yes [provider]  Omega-3 Fatty Acids (FISH OIL) 1000 MG CAPS Take 4,000 mg by mouth daily.   Yes [provider]  ondansetron (ZOFRAN) 4 MG tablet Take 1 tablet (4 mg total) by mouth every 6 (six) hours as needed for nausea. Patient taking differently: Take 4 mg by mouth every 4 (four) hours.  09/06/17  Yes Georgette Shell, MD    Oxycodone HCl 10 MG TABS Take 1 tablet (10 mg total) by mouth every 6 (six) hours as needed for up to 14 days (pain). Patient taking differently: Take 10 mg by mouth every 6 (six) hours as needed (pain). Started 10/05/17 10/04/17 10/18/17 Yes Eugenie Filler, MD  senna (SENOKOT) 8.6 MG TABS tablet Take 2 tablets by mouth 2 (two) times daily as needed for mild constipation.   Yes [provider]  vitamin B-12 (CYANOCOBALAMIN) 500 MCG tablet Take 500 mcg by mouth daily.   Yes [provider]  Amino Acids-Protein Hydrolys (FEEDING SUPPLEMENT, PRO-STAT SUGAR FREE 64,) LIQD Take 30 mLs by mouth 2 (two) times daily between meals. Patient not taking: Reported on 10/10/2017 10/05/17   Eugenie Filler, MD  feeding supplement, GLUCERNA SHAKE, (GLUCERNA SHAKE) LIQD Take 237 mLs by mouth 3 (three) times daily between meals. Patient not taking: Reported on 10/10/2017 09/06/17   Georgette Shell, MD    Family History Family History  Problem Relation Age of Onset  . Cancer Sister   .  Diabetes Mellitus II Mother   . Tuberculosis Father     Social History Social History   Tobacco Use  . Smoking status: Never Smoker  . Smokeless tobacco: Never Used  Substance Use Topics  . Alcohol use: No  . Drug use: No     Allergies   Benadryl [diphenhydramine]; Ciprofloxacin; Furosemide; Nitrofurantoin monohyd macro; Septra [bactrim]; Sulfa drugs cross reactors; Allopurinol; and Ceclor [cefaclor]   Review of Systems Review of Systems  Unable to perform ROS: Mental status change     Physical Exam Updated Vital Signs BP (!) 143/66 (BP Location: Right Arm)   Pulse 95   Temp 98.3 F (36.8 C) (Oral)   Resp 14   Ht 5\' 4"  (1.626 m)   Wt 83.9 kg (185 lb)   SpO2 94%   BMI 31.76 kg/m   Physical Exam  Constitutional: She has a sickly appearance. No distress.  HENT:  Head: Normocephalic and atraumatic.  Eyes: Conjunctivae and EOM are normal.  Cardiovascular: Normal rate and  regular rhythm.  Pulmonary/Chest: Effort normal and breath sounds normal. No stridor. No respiratory distress.  Right upper chest wall chemotherapy port unremarkable  Abdominal: She exhibits no distension.  Genitourinary:  Genitourinary Comments: Foley catheter draining dark brown urine  Musculoskeletal: She exhibits no edema.  Patient has substantial edema throughout, with wrappings in bilateral lower extremities, no proximal erythema.  Neurological: She is alert. She displays atrophy. No cranial nerve deficit.  Skin: Skin is warm and dry.  Psychiatric: She is slowed and withdrawn.  Nursing note and vitals reviewed.    ED Treatments / Results  Labs (all labs ordered are listed, but only abnormal results are displayed) Labs Reviewed  COMPREHENSIVE METABOLIC PANEL - Abnormal; Notable for the following components:      Result Value   Sodium 133 (*)    Glucose, Bld 118 (*)    BUN 30 (*)    Creatinine, Ser 1.22 (*)    Calcium 7.6 (*)    Total Protein 4.4 (*)    Albumin 2.0 (*)    Total Bilirubin 0.2 (*)    GFR calc non Af Amer 44 (*)    GFR calc Af Amer 51 (*)    All other components within normal limits  CBC WITH DIFFERENTIAL/PLATELET - Abnormal; Notable for the following components:   RBC 3.64 (*)    Hemoglobin 10.3 (*)    HCT 31.1 (*)    RDW 15.9 (*)    Platelets 99 (*)    Lymphs Abs 0.6 (*)    All other components within normal limits  URINALYSIS, ROUTINE W REFLEX MICROSCOPIC - Abnormal; Notable for the following components:   Color, Urine AMBER (*)    APPearance TURBID (*)    Glucose, UA 50 (*)    Hgb urine dipstick LARGE (*)    Protein, ur 100 (*)    Nitrite POSITIVE (*)    Leukocytes, UA LARGE (*)    RBC / HPF >50 (*)    WBC, UA >50 (*)    Bacteria, UA FEW (*)    All other components within normal limits  LIPASE, BLOOD    EKG None  Radiology Dg Chest 2 View  Result Date: 10/10/2017 CLINICAL DATA:  Order for fever of 101.  Hx of HTN Diabetes CHF EXAM:  CHEST - 2 VIEW COMPARISON:  09/27/2017 FINDINGS: Patient has a RIGHT-sided PowerPort, tip overlying the level of the LOWER superior vena cava or UPPER RIGHT atrium. The heart is enlarged. There  is dense opacity at the LEFT lung base which obscures the hemidiaphragm. There is a LEFT pleural effusion. Small RIGHT pleural effusion is also present. IMPRESSION: 1. Cardiomegaly. 2. Bilateral pleural effusions and LEFT LOWER lobe consolidation. Electronically Signed   By: Nolon Nations M.D.   On: 10/10/2017 13:36    Procedures Procedures (including critical care time)  Medications Ordered in ED Medications - No data to display   Initial Impression / Assessment and Plan / ED Course  I have reviewed the triage vital signs and the nursing notes.  Pertinent labs & imaging results that were available during my care of the patient were reviewed by me and considered in my medical decision making (see chart for details).    Chart review after the initial evaluation notable for recent hospitalization, notable for acute kidney injury, placement of Foley catheter. 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    1:42 PM Patient accompanied by her sons, who notes patient's history of persistent weakness, ongoing confusion, and that her last chemotherapy was likely just before recent hospitalization, at least 10 days ago. Initial labs notable for f ongoing renal dysfunction, though improved from recent admission, mild hyponatremia, and evidence for urinary tract infection. Initial imaging notable for pneumonia. Given these findings, pneumonia, UTI, fever at the nursing facility, nausea, vomiting, the patient will start broad-spectrum antibiotics, require admission for further evaluation and management. Final Clinical Impressions(s) / ED Diagnoses  H CAP UTI   Carmin Muskrat, MD 10/10/17 1344

## 2017-10-10 NOTE — ED Notes (Signed)
Bed: WA17 Expected date:  Expected time:  Means of arrival:  Comments: EMS-N/V

## 2017-10-10 NOTE — Plan of Care (Signed)
  Problem: Pain Managment: Goal: General experience of comfort will improve Outcome: Progressing   

## 2017-10-11 LAB — CBC
HEMATOCRIT: 21.2 % — AB (ref 36.0–46.0)
HEMOGLOBIN: 7.2 g/dL — AB (ref 12.0–15.0)
MCH: 28.7 pg (ref 26.0–34.0)
MCHC: 34 g/dL (ref 30.0–36.0)
MCV: 84.5 fL (ref 78.0–100.0)
Platelets: 120 10*3/uL — ABNORMAL LOW (ref 150–400)
RBC: 2.51 MIL/uL — ABNORMAL LOW (ref 3.87–5.11)
RDW: 16.2 % — AB (ref 11.5–15.5)
WBC: 8.5 10*3/uL (ref 4.0–10.5)

## 2017-10-11 LAB — BASIC METABOLIC PANEL
ANION GAP: 4 — AB (ref 5–15)
BUN: 30 mg/dL — AB (ref 8–23)
CO2: 23 mmol/L (ref 22–32)
Calcium: 7.6 mg/dL — ABNORMAL LOW (ref 8.9–10.3)
Chloride: 105 mmol/L (ref 98–111)
Creatinine, Ser: 1.16 mg/dL — ABNORMAL HIGH (ref 0.44–1.00)
GFR calc Af Amer: 54 mL/min — ABNORMAL LOW (ref >60)
GFR, EST NON AFRICAN AMERICAN: 47 mL/min — AB (ref >60)
GLUCOSE: 107 mg/dL — AB (ref 70–99)
POTASSIUM: 4.8 mmol/L (ref 3.5–5.1)
Sodium: 132 mmol/L — ABNORMAL LOW (ref 135–145)

## 2017-10-11 LAB — PROCALCITONIN: PROCALCITONIN: 0.93 ng/mL

## 2017-10-11 LAB — MRSA PCR SCREENING: MRSA by PCR: NEGATIVE

## 2017-10-11 MED ORDER — SODIUM CHLORIDE 0.9 % IV SOLN
INTRAVENOUS | Status: DC | PRN
  Administered 2017-10-11: 1000 mL via INTRAVENOUS

## 2017-10-11 NOTE — Progress Notes (Signed)
Patient has had very poor PO intake.  Had only a couple bites of her meal about 240 mls total this shift. She refuses glucerna.

## 2017-10-11 NOTE — Progress Notes (Signed)
Palliative Medicine Team consult was received.   Chart reviewed I met briefly with patient.  She relies on daughter to help with decision making.  I called and spoke with her daughter and we set family meeting with patient and her daughter for 3PM tomorrow.   If there are urgent needs or questions please call [phone]. Thank you for consulting out team to assist with this patients care.  NO CHARGE NOTE  Micheline Rough, MD [place] Team [phone]

## 2017-10-11 NOTE — Progress Notes (Signed)
PROGRESS NOTE [facility]   Emily Kennedy   YDX:[id number] DOB: Dec 27, 1945  DOA: 10/10/2017 PCP: [name], MD   Brief Narrative:  Emily Kennedy is a 72 y.o. female with medical history of DM2, myelodysplastic syndrome, lymphoplasmocytic lymphoma, MGUS,  rheumatoid arthritis, HTN, HLD, CKD 3, chronic lymphedema, hypothyroidism who lives in a SNF.  Patient was sent to the emergency department due to fever of 101 associated with nausea and vomiting for 2 days prior to admission.  Upon ED evaluation chest x-ray shows left lower lobe consolidation with bilateral pleural effusions, and UA grossly abnormal consistent with UTI.  Patient was admitted with working diagnosis of sepsis due to presumed UTI and pneumonia  Subjective: Patient seen and examined, she reported feeling bad, nonspecific.  Report pain all over her body.  Had one episode of emesis this morning.  Afebrile since admission.  Assessment & Plan: Sepsis secondary to UTI and pneumonia UA grossly abnormal with large leukocyte and positive nitrates.  Chest x-ray shows left lower lobe pneumonia. Likely CAUTI, patient with chronic foley. Unfortunately urine and blood cultures were not obtained prior to admission and patient was placed on empiric antibiotic therapy.  For now will monitor and treat clinically.  She is on vancomycin and Zosyn, will DC vancomycin as MRSA is negative.  Keep Zosyn for now.  Trend fever and CBC.  Will add procalcitonin.  Will change Foley.  CKD stage III Creatinine stable, continue to monitor  Anemia of chronic disease On admission Hgb was higher than baseline, likely volume contraction, today Hgb 7.2, No signs of over bleeding. Will continue to monitor for now, transfuse if Hgb < 7. Check CBC in AM    LE edema ? If chronic lymphoedema  ?Unna boots in place, family does not know how often this is changed. Consider changing while inpatient   MGUS/Myelodysplastic syndrome/Thrombocytopenia/Malignant  Lymphoma  No on treatment, per family Dr Irene Limbo has stopped chemo  Palliative care consulted   Cirrhosis/Splenomegally and ascites  Last paracentesis 09/02/17, cont to monitor  Check LFT and INR   DVT prophylaxis: Lovenox Code Status: DNR  Family Communication: None at bedside  Disposition Plan: Unable to determine   Consultants:   Palliative care   Procedures:   None   Antimicrobials: Anti-infectives (From admission, onward)   Start     Dose/Rate Route Frequency Ordered Stop   10/11/17 1400  vancomycin (VANCOCIN) IVPB 750 mg/150 ml premix  Status:  Discontinued     750 mg 150 mL/hr over 60 Minutes Intravenous Every 24 hours 10/10/17 1541 10/11/17 1701   10/10/17 2200  piperacillin-tazobactam (ZOSYN) IVPB 3.375 g     3.375 g 12.5 mL/hr over 240 Minutes Intravenous Every 8 hours 10/10/17 1559     10/10/17 1415  vancomycin (VANCOCIN) 1,500 mg in sodium chloride 0.9 % 500 mL IVPB     1,500 mg 250 mL/hr over 120 Minutes Intravenous STAT 10/10/17 1401 10/10/17 1746   10/10/17 1400  aztreonam (AZACTAM) 2 g in sodium chloride 0.9 % 100 mL IVPB     2 g 200 mL/hr over 30 Minutes Intravenous  Once 10/10/17 1345 10/10/17 1436         Objective: Vitals:   10/10/17 2100 10/11/17 0517 10/11/17 0718 10/11/17 1610  BP: (!) 154/78 (!) 126/58  132/68  Pulse: (!) 115 80  88  Resp:  20  16  Temp: 100.3 F (37.9 C) 99.1 F (37.3 C)  99.7 F (37.6 C)  TempSrc: Axillary Oral  Oral  SpO2: 98% 95% 91% 98%  Weight:      Height:        Intake/Output Summary (Last 24 hours) at 10/11/2017 1632 Last data filed at 10/11/2017 1610 Gross per 24 hour  Intake 372.54 ml  Output 600 ml  Net -227.46 ml   Filed Weights   10/10/17 1222 10/10/17 1626  Weight: 83.9 kg (185 lb) 86.9 kg (191 lb 9.3 oz)    Examination:  General exam: Ill Looking  HEENT: OP moist and clear Respiratory system: Decrease breath sound bibasilar, rales at LLL, no wheezing  Cardiovascular system: S1 & S2 heard, RRR.  No JVD, murmurs, rubs or gallops Gastrointestinal system: Abdomen is nondistended, soft and nontender.  Central nervous system: Alert and oriented. No focal neurological deficits. Extremities: LE wrap up to the knee, tender to palpation  Skin: No rashes Psychiatry: . Mood & affect appropriate.    Data Reviewed: I have personally reviewed following labs and imaging studies  CBC: Recent Labs  Lab 10/05/17 0447 10/10/17 1227 10/11/17 0522  WBC 4.4 8.2 8.5  NEUTROABS  --  7.2  --   HGB 7.8* 10.3* 7.2*  HCT 23.3* 31.1* 21.2*  MCV 83.5 85.4 84.5  PLT 114* 99* 161*   Basic Metabolic Panel: Recent Labs  Lab 10/05/17 0447 10/10/17 1227 10/11/17 0522  NA 136 133* 132*  K 4.1 4.8 4.8  CL 106 105 105  CO2 24 23 23   GLUCOSE 116* 118* 107*  BUN 31* 30* 30*  CREATININE 1.10* 1.22* 1.16*  CALCIUM 7.8* 7.6* 7.6*   GFR: Estimated Creatinine Clearance: 48.2 mL/min (A) (by C-G formula based on SCr of 1.16 mg/dL (H)). Liver Function Tests: Recent Labs  Lab 10/05/17 0447 10/10/17 1227  AST 17 17  ALT 11 13  ALKPHOS 62 79  BILITOT 0.3 0.2*  PROT 4.3* 4.4*  ALBUMIN 2.0* 2.0*   Recent Labs  Lab 10/10/17 1227  LIPASE 25   No results for input(s): AMMONIA in the last 168 hours. Coagulation Profile: No results for input(s): INR, PROTIME in the last 168 hours. Cardiac Enzymes: No results for input(s): CKTOTAL, CKMB, CKMBINDEX, TROPONINI in the last 168 hours. BNP (last 3 results) No results for input(s): PROBNP in the last 8760 hours. HbA1C: No results for input(s): HGBA1C in the last 72 hours. CBG: No results for input(s): GLUCAP in the last 168 hours. Lipid Profile: No results for input(s): CHOL, HDL, LDLCALC, TRIG, CHOLHDL, LDLDIRECT in the last 72 hours. Thyroid Function Tests: No results for input(s): TSH, T4TOTAL, FREET4, T3FREE, THYROIDAB in the last 72 hours. Anemia Panel: No results for input(s): VITAMINB12, FOLATE, FERRITIN, TIBC, IRON, RETICCTPCT in the last 72  hours. Sepsis Labs: No results for input(s): PROCALCITON, LATICACIDVEN in the last 168 hours.  Recent Results (from the past 240 hour(s))  MRSA PCR Screening     Status: None   Collection Time: 10/11/17  9:31 AM  Result Value Ref Range Status   MRSA by PCR NEGATIVE NEGATIVE Final    Comment:        The GeneXpert MRSA Assay (FDA approved for NASAL specimens only), is one component of a comprehensive MRSA colonization surveillance program. It is not intended to diagnose MRSA infection nor to guide or monitor treatment for MRSA infections. Performed at [facility], [place] [address] [place], [place] [address]       Radiology Studies: Dg Chest 2 View  Result Date: 10/10/2017 CLINICAL DATA:  Order for fever of 101.  Hx of HTN Diabetes CHF EXAM: CHEST - 2 VIEW COMPARISON:  09/27/2017 FINDINGS: Patient has a RIGHT-sided PowerPort, tip overlying the level of the LOWER superior vena cava or UPPER RIGHT atrium. The heart is enlarged. There is dense opacity at the LEFT lung base which obscures the hemidiaphragm. There is a LEFT pleural effusion. Small RIGHT pleural effusion is also present. IMPRESSION: 1. Cardiomegaly. 2. Bilateral pleural effusions and LEFT LOWER lobe consolidation. Electronically Signed   By: Nolon Nations M.D.   On: 10/10/2017 13:36      Scheduled Meds: . amLODipine  10 mg Oral Daily  . calcium carbonate  1 tablet Oral Daily  . diltiazem  120 mg Oral Q8H  . enoxaparin (LOVENOX) injection  40 mg Subcutaneous Q24H  . famotidine  20 mg Oral BID  . feeding supplement (GLUCERNA SHAKE)  237 mL Oral TID BM  . feeding supplement (PRO-STAT SUGAR FREE 64)  30 mL Oral BID BM  . ferrous sulfate  325 mg Oral BID WC  . fluticasone  1 spray Each Nare Daily  . hydrALAZINE  100 mg Oral TID  . levothyroxine  224 mcg Oral QAC breakfast  . sodium chloride flush  3 mL Intravenous Q12H   Continuous Infusions: . sodium chloride 20 mL/hr at 10/11/17 1131  .  sodium chloride 1,000 mL (10/11/17 1347)  . piperacillin-tazobactam (ZOSYN)  IV 3.375 g (10/11/17 1343)  . vancomycin 750 mg (10/11/17 1348)     LOS: 1 day    Time spent: Total of 35 minutes spent with pt, greater than 50% of which was spent in discussion of  treatment, counseling and coordination of care   Chipper Oman, MD Pager: Text Page via [email or web address]   If 7PM-7AM, please contact night-coverage [email or web address] 10/11/2017, 4:32 PM   Note - This record has been created using [employer]. Chart creation errors have been sought, but may not always have been located. Such creation errors do not reflect on the standard of medical care.

## 2017-10-11 NOTE — Progress Notes (Signed)
Administered 10am meds, and patient vomited shortly after.

## 2017-10-12 ENCOUNTER — Encounter (HOSPITAL_COMMUNITY): Payer: Self-pay

## 2017-10-12 DIAGNOSIS — Z515 Encounter for palliative care: Secondary | ICD-10-CM

## 2017-10-12 DIAGNOSIS — R509 Fever, unspecified: Secondary | ICD-10-CM

## 2017-10-12 DIAGNOSIS — Z7189 Other specified counseling: Secondary | ICD-10-CM

## 2017-10-12 LAB — AMMONIA: Ammonia: 15 umol/L (ref 9–35)

## 2017-10-12 LAB — CBC WITH DIFFERENTIAL/PLATELET
BASOS ABS: 0 10*3/uL (ref 0.0–0.1)
Basophils Relative: 0 %
Eosinophils Absolute: 0.3 10*3/uL (ref 0.0–0.7)
Eosinophils Relative: 4 %
HEMATOCRIT: 23 % — AB (ref 36.0–46.0)
HEMOGLOBIN: 7.7 g/dL — AB (ref 12.0–15.0)
LYMPHS PCT: 8 %
Lymphs Abs: 0.6 10*3/uL — ABNORMAL LOW (ref 0.7–4.0)
MCH: 28.5 pg (ref 26.0–34.0)
MCHC: 33.5 g/dL (ref 30.0–36.0)
MCV: 85.2 fL (ref 78.0–100.0)
Monocytes Absolute: 0.5 10*3/uL (ref 0.1–1.0)
Monocytes Relative: 6 %
NEUTROS ABS: 6.2 10*3/uL (ref 1.7–7.7)
Neutrophils Relative %: 82 %
Platelets: 144 10*3/uL — ABNORMAL LOW (ref 150–400)
RBC: 2.7 MIL/uL — AB (ref 3.87–5.11)
RDW: 16.2 % — ABNORMAL HIGH (ref 11.5–15.5)
WBC: 7.5 10*3/uL (ref 4.0–10.5)

## 2017-10-12 LAB — COMPREHENSIVE METABOLIC PANEL
ALBUMIN: 1.8 g/dL — AB (ref 3.5–5.0)
ALT: 10 U/L (ref 0–44)
AST: 13 U/L — ABNORMAL LOW (ref 15–41)
Alkaline Phosphatase: 74 U/L (ref 38–126)
Anion gap: 4 — ABNORMAL LOW (ref 5–15)
BUN: 30 mg/dL — ABNORMAL HIGH (ref 8–23)
CHLORIDE: 105 mmol/L (ref 98–111)
CO2: 24 mmol/L (ref 22–32)
Calcium: 7.8 mg/dL — ABNORMAL LOW (ref 8.9–10.3)
Creatinine, Ser: 1.24 mg/dL — ABNORMAL HIGH (ref 0.44–1.00)
GFR calc non Af Amer: 43 mL/min — ABNORMAL LOW (ref >60)
GFR, EST AFRICAN AMERICAN: 50 mL/min — AB (ref >60)
Glucose, Bld: 119 mg/dL — ABNORMAL HIGH (ref 70–99)
Potassium: 4.6 mmol/L (ref 3.5–5.1)
SODIUM: 133 mmol/L — AB (ref 135–145)
Total Bilirubin: 0.7 mg/dL (ref 0.3–1.2)
Total Protein: 4.5 g/dL — ABNORMAL LOW (ref 6.5–8.1)

## 2017-10-12 LAB — PROTIME-INR
INR: 1.07
Prothrombin Time: 13.8 seconds (ref 11.4–15.2)

## 2017-10-12 MED ORDER — LACTULOSE 10 GM/15ML PO SOLN
20.0000 g | Freq: Two times a day (BID) | ORAL | Status: DC
  Administered 2017-10-12 – 2017-10-13 (×4): 20 g via ORAL
  Filled 2017-10-12 (×4): qty 30

## 2017-10-12 MED ORDER — OXYCODONE HCL 5 MG PO TABS
10.0000 mg | ORAL_TABLET | ORAL | Status: DC | PRN
  Administered 2017-10-12 – 2017-10-14 (×6): 10 mg via ORAL
  Filled 2017-10-12 (×6): qty 2

## 2017-10-12 NOTE — Progress Notes (Signed)
Palliative care brief progress note  Met with patient's children.  They understand that she is continuing to decline and are interested in hospice moving forward.  Continue to monitor intake over the next 24 hours to help determine appropriateness for long term care with hospice support vs residential hospice placement.  Full consult to follow.  Micheline Rough, MD [place] [facility] [phone]

## 2017-10-12 NOTE — Progress Notes (Signed)
LCSW following for disposition.   Family has palliative meeting today at 3 pm.   LCSW will continue to follow.   Carolin Coy [facility]

## 2017-10-12 NOTE — Progress Notes (Addendum)
PROGRESS NOTE    Emily Kennedy  XVQ:[id number] DOB: [birth year] DOA: 10/10/2017 PCP: [name], MD     Brief Narrative:  Emily Kennedy is a 72 y.o.femalewith medical history ofDM2, myelodysplastic syndrome, lymphoplasmocytic lymphoma, MGUS, rheumatoid arthritis, HTN, HLD, CKD 3, chronic lymphedema, hypothyroidism who lives in a SNF.  Patient was sent to the emergency department due to fever of 101 associated with nausea and vomiting for 2 days prior to admission.  Upon ED evaluation chest x-ray shows left lower lobe consolidation with bilateral pleural effusions, and UA grossly abnormal consistent with UTI.  Patient was admitted with working diagnosis of sepsis due to presumed UTI and pneumonia  New events last 24 hours / Subjective: No acute events.  She states that she feels slightly better.  No specific complaints this morning.  Assessment & Plan:   Principal Problem:   Sepsis secondary to UTI [facility]) Active Problems:   MGUS (monoclonal gammopathy of unknown significance)   HCAP (healthcare-associated pneumonia)   Anemia of chronic disease   MDS (myelodysplastic syndrome) (HCC)   Malignant lymphoma, lymphoplasmacytic (HCC)   Thrombocytopenia (HCC)  Sepsis secondary to CAUTI (POA) and left lobar HCAP pneumonia  -UA grossly abnormal with large leukocyte and positive nitrates.  Likely CAUTI, patient with chronic foley. Unfortunately urine and blood cultures were not obtained prior to admission and patient was placed on empiric antibiotic therapy.  For now will monitor and treat clinically.  Previous urine cultures in 2018-2019 have been unhelpful.  -Chest x-ray shows left lower lobe pneumonia.  She is on vancomycin and Zosyn, will DC vancomycin as MRSA is negative.  -Continue Zosyn for now  CKD stage III -Baseline Cr ~1.4 -Stable   Anemia of chronic disease -On admission Hgb was higher than baseline, likely volume contraction -Baseline Hgb ~7-8 -Stable without signs of  bleeding   MGUS/Myelodysplastic syndrome/Thrombocytopenia/Malignant Lymphoma  -No on treatment, per family Dr Irene Limbo has stopped chemo  -Palliative care consulted   Cirrhosis/Splenomegaly and ascites  -Last paracentesis 09/02/17, cont to monitor  -LFT unchanged, INR normal, ammonia normal -Resume home lactulose   Essential hypertension -Continue Norvasc, Cardizem  Hypothyroidism -Continue Synthroid   DVT prophylaxis: Lovenox Code Status: DNR Family Communication: No family at bedside Disposition Plan: Pending improvement, palliative care team meeting family today   Consultants:   Palliative care  Procedures:   None  Antimicrobials:  Anti-infectives (From admission, onward)   Start     Dose/Rate Route Frequency Ordered Stop   10/11/17 1400  vancomycin (VANCOCIN) IVPB 750 mg/150 ml premix  Status:  Discontinued     750 mg 150 mL/hr over 60 Minutes Intravenous Every 24 hours 10/10/17 1541 10/11/17 1701   10/10/17 2200  piperacillin-tazobactam (ZOSYN) IVPB 3.375 g     3.375 g 12.5 mL/hr over 240 Minutes Intravenous Every 8 hours 10/10/17 1559     10/10/17 1415  vancomycin (VANCOCIN) 1,500 mg in sodium chloride 0.9 % 500 mL IVPB     1,500 mg 250 mL/hr over 120 Minutes Intravenous STAT 10/10/17 1401 10/10/17 1746   10/10/17 1400  aztreonam (AZACTAM) 2 g in sodium chloride 0.9 % 100 mL IVPB     2 g 200 mL/hr over 30 Minutes Intravenous  Once 10/10/17 1345 10/10/17 1436        Objective: Vitals:   10/11/17 1610 10/11/17 2111 10/12/17 0552 10/12/17 1318  BP: 132/68 138/65 (!) 143/64 137/66  Pulse: 88 96 89 90  Resp: 16 16 20 16   Temp: 99.7 F (37.6  C) 99.8 F (37.7 C) 98.6 F (37 C) 99.1 F (37.3 C)  TempSrc: Oral Oral Oral Oral  SpO2: 98% 93% 94% 94%  Weight:      Height:        Intake/Output Summary (Last 24 hours) at 10/12/2017 1321 Last data filed at 10/12/2017 0053 Gross per 24 hour  Intake 556.02 ml  Output -  Net 556.02 ml   Filed Weights    10/10/17 1222 10/10/17 1626  Weight: 83.9 kg (185 lb) 86.9 kg (191 lb 9.3 oz)    Examination:  General exam: Appears calm and comfortable  Respiratory system: Clear to auscultation. Respiratory effort normal. Cardiovascular system: S1 & S2 heard, RRR. No JVD, murmurs, rubs, gallops or clicks. Gastrointestinal system: Abdomen is nondistended, soft and nontender. No organomegaly or masses felt. Normal bowel sounds heard. Central nervous system: Alert and oriented. No focal neurological deficits. Extremities: Symmetric Skin: No rashes, lesions or ulcers on exposed skin  Psychiatry: Judgement and insight appear normal. Mood & affect appropriate.   Data Reviewed: I have personally reviewed following labs and imaging studies  CBC: Recent Labs  Lab 10/10/17 1227 10/11/17 0522 10/12/17 0447  WBC 8.2 8.5 7.5  NEUTROABS 7.2  --  6.2  HGB 10.3* 7.2* 7.7*  HCT 31.1* 21.2* 23.0*  MCV 85.4 84.5 85.2  PLT 99* 120* 671*   Basic Metabolic Panel: Recent Labs  Lab 10/10/17 1227 10/11/17 0522 10/12/17 0447  NA 133* 132* 133*  K 4.8 4.8 4.6  CL 105 105 105  CO2 23 23 24   GLUCOSE 118* 107* 119*  BUN 30* 30* 30*  CREATININE 1.22* 1.16* 1.24*  CALCIUM 7.6* 7.6* 7.8*   GFR: Estimated Creatinine Clearance: 45.1 mL/min (A) (by C-G formula based on SCr of 1.24 mg/dL (H)). Liver Function Tests: Recent Labs  Lab 10/10/17 1227 10/12/17 0447  AST 17 13*  ALT 13 10  ALKPHOS 79 74  BILITOT 0.2* 0.7  PROT 4.4* 4.5*  ALBUMIN 2.0* 1.8*   Recent Labs  Lab 10/10/17 1227  LIPASE 25   Recent Labs  Lab 10/12/17 0535  AMMONIA 15   Coagulation Profile: Recent Labs  Lab 10/12/17 0447  INR 1.07   Cardiac Enzymes: No results for input(s): CKTOTAL, CKMB, CKMBINDEX, TROPONINI in the last 168 hours. BNP (last 3 results) No results for input(s): PROBNP in the last 8760 hours. HbA1C: No results for input(s): HGBA1C in the last 72 hours. CBG: No results for input(s): GLUCAP in the last  168 hours. Lipid Profile: No results for input(s): CHOL, HDL, LDLCALC, TRIG, CHOLHDL, LDLDIRECT in the last 72 hours. Thyroid Function Tests: No results for input(s): TSH, T4TOTAL, FREET4, T3FREE, THYROIDAB in the last 72 hours. Anemia Panel: No results for input(s): VITAMINB12, FOLATE, FERRITIN, TIBC, IRON, RETICCTPCT in the last 72 hours. Sepsis Labs: Recent Labs  Lab 10/11/17 2044  PROCALCITON 0.93    Recent Results (from the past 240 hour(s))  MRSA PCR Screening     Status: None   Collection Time: 10/11/17  9:31 AM  Result Value Ref Range Status   MRSA by PCR NEGATIVE NEGATIVE Final    Comment:        The GeneXpert MRSA Assay (FDA approved for NASAL specimens only), is one component of a comprehensive MRSA colonization surveillance program. It is not intended to diagnose MRSA infection nor to guide or monitor treatment for MRSA infections. Performed at [facility], [place] [address] [place], [place] [address]  Radiology Studies: Dg Chest 2 View  Result Date: 10/10/2017 CLINICAL DATA:  Order for fever of 101.  Hx of HTN Diabetes CHF EXAM: CHEST - 2 VIEW COMPARISON:  09/27/2017 FINDINGS: Patient has a RIGHT-sided PowerPort, tip overlying the level of the LOWER superior vena cava or UPPER RIGHT atrium. The heart is enlarged. There is dense opacity at the LEFT lung base which obscures the hemidiaphragm. There is a LEFT pleural effusion. Small RIGHT pleural effusion is also present. IMPRESSION: 1. Cardiomegaly. 2. Bilateral pleural effusions and LEFT LOWER lobe consolidation. Electronically Signed   By: Nolon Nations M.D.   On: 10/10/2017 13:36      Scheduled Meds: . amLODipine  10 mg Oral Daily  . calcium carbonate  1 tablet Oral Daily  . diltiazem  120 mg Oral Q8H  . enoxaparin (LOVENOX) injection  40 mg Subcutaneous Q24H  . famotidine  20 mg Oral BID  . feeding supplement (GLUCERNA SHAKE)  237 mL Oral TID BM  . feeding supplement  (PRO-STAT SUGAR FREE 64)  30 mL Oral BID BM  . ferrous sulfate  325 mg Oral BID WC  . fluticasone  1 spray Each Nare Daily  . hydrALAZINE  100 mg Oral TID  . levothyroxine  224 mcg Oral QAC breakfast  . sodium chloride flush  3 mL Intravenous Q12H   Continuous Infusions: . sodium chloride Stopped (10/11/17 2103)  . sodium chloride 10 mL/hr at 10/12/17 0053  . piperacillin-tazobactam (ZOSYN)  IV 3.375 g (10/12/17 2536)     LOS: 2 days    Time spent: 35 minutes   Dessa Phi, DO [facility] [email or web address] Password [email or web address] 10/12/2017, 1:21 PM

## 2017-10-13 DIAGNOSIS — A419 Sepsis, unspecified organism: Secondary | ICD-10-CM

## 2017-10-13 DIAGNOSIS — N390 Urinary tract infection, site not specified: Secondary | ICD-10-CM

## 2017-10-13 LAB — BASIC METABOLIC PANEL WITH GFR
Anion gap: 6 (ref 5–15)
BUN: 29 mg/dL — ABNORMAL HIGH (ref 8–23)
CO2: 22 mmol/L (ref 22–32)
Calcium: 8 mg/dL — ABNORMAL LOW (ref 8.9–10.3)
Chloride: 107 mmol/L (ref 98–111)
Creatinine, Ser: 1.19 mg/dL — ABNORMAL HIGH (ref 0.44–1.00)
GFR calc Af Amer: 52 mL/min — ABNORMAL LOW
GFR calc non Af Amer: 45 mL/min — ABNORMAL LOW
Glucose, Bld: 94 mg/dL (ref 70–99)
Potassium: 4.4 mmol/L (ref 3.5–5.1)
Sodium: 135 mmol/L (ref 135–145)

## 2017-10-13 LAB — CBC
HCT: 23.3 % — ABNORMAL LOW (ref 36.0–46.0)
HEMOGLOBIN: 7.7 g/dL — AB (ref 12.0–15.0)
MCH: 28.3 pg (ref 26.0–34.0)
MCHC: 33 g/dL (ref 30.0–36.0)
MCV: 85.7 fL (ref 78.0–100.0)
Platelets: 159 10*3/uL (ref 150–400)
RBC: 2.72 MIL/uL — AB (ref 3.87–5.11)
RDW: 16 % — ABNORMAL HIGH (ref 11.5–15.5)
WBC: 6 10*3/uL (ref 4.0–10.5)

## 2017-10-13 MED ORDER — ALBUTEROL SULFATE (2.5 MG/3ML) 0.083% IN NEBU
2.5000 mg | INHALATION_SOLUTION | Freq: Two times a day (BID) | RESPIRATORY_TRACT | Status: DC
  Administered 2017-10-13 – 2017-10-14 (×2): 2.5 mg via RESPIRATORY_TRACT
  Filled 2017-10-13 (×2): qty 3

## 2017-10-13 NOTE — Consult Note (Signed)
Consultation Note Date: 10/13/2017   Patient Name: Emily Kennedy  DOB: 10/28/1945  MRN: [id number]  Age / Sex: 72 y.o., female  PCP: [name], MD Referring Physician: Dessa Phi, DO  Reason for Consultation: Establishing goals of care  HPI/Patient Profile: 72 y.o. female  with past medical history of DM2, myelodysplastic syndrome, lymphoplastic lymphoma, MGUS, RA, HTN, HLD, CKD 3, chronic lymphedema, hypothyroid, and living at [facility] admitted on 10/10/2017 with sepsis related to PNA and UTI.  Palliative consulted for goals of care.   Clinical Assessment and Goals of Care: I met today with Ms. Gallus's children [name], [name], and Sprague). We discussed clinical course as well as wishes moving forward in regard to care in light of her continued decline in nutrition, cognition, and functional status.  Ms. Raetz has no insight into her long term condition and was not able to participate in meaningful conversation regarding goals.  I then met with children outside of the room.  Values and goals of care important to patient and family were attempted to be elicited.  We discussed difference between a aggressive medical intervention path and a palliative, comfort focused care path.    Her children report that she has been stating that she is "ready to go" and "why are you keeping me here."  During these conversations, it is clear to them that she is talking about being ready to die.  Her family report that [name] has recommended against further chemotherapy, and family agrees that she would not want further chemo even if it were offered.  Staff report that she has been having episodic uncontrolled pain and little to no PO intake over the last day or so.  Concept of hospice and palliative care were discussed.  Family report desire to elect her hospice benefits and we discussed options for home with hospice  (family unable to meet her care needs/provide 24 hours supervision), long term facility with hospice (family is open to this, but will need to figure out regarding payor for room and board- daughter reports that she was on medicaid in the past), or residential hospice (this is preference and will try to determine if she is appropriate.)  Questions and concerns addressed.   PMT will continue to support holistically.  SUMMARY OF RECOMMENDATIONS   - Family is interested in hospice services.  Will likely be either SNF with hospice of residential hospice on discharge.  Discussed with RN and will plan to monitor over next 24 hours including diligent monitoring of intake to determine if appropriate for residential hospice.    Code Status/Advance Care Planning:  DNR   Symptom Management:   Pain: Reports that medication does not last until due again.  Continue oxycodone 17m, but will increase frequency to Q3 hours prn.  Palliative Prophylaxis:   Bowel Regimen and Frequent Pain Assessment  Psycho-social/Spiritual:   Desire for further Chaplaincy support:no  Additional Recommendations: Education on Hospice  Prognosis:   < 6 months and she should qualify for her hospice benefits if desired.  Will continue to monitor intake and clinical course over next 24 hours to determine if she appears appropriate for residential hospice.  Discharge Planning: To Be Determined- SNF with hospice vs residential hospice       Primary Diagnoses: Present on Admission: . (Resolved) UTI (urinary tract infection) . Thrombocytopenia ([place]) . MGUS (monoclonal gammopathy of unknown significance) . 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 MDS (myelodysplastic syndrome) ([place]) . HCAP (healthcare-associated pneumonia) . (Resolved) Acute lower UTI . Anemia of chronic disease . Sepsis secondary to UTI [facility])   I have reviewed the medical record, interviewed the patient and family, and examined the patient.  The following aspects are pertinent.  Past Medical History:  Diagnosis Date  . CHF (congestive heart failure) ([place])   . Diabetes mellitus   . Hypertension   . Thyroid disease   . Vasculitis ([place])    Social History   Socioeconomic History  . Marital status: Divorced    Spouse name: Not on file  . Number of children: Not on file  . Years of education: Not on file  . Highest education level: Not on file  Occupational History  . Occupation: Retired  Scientific laboratory technician  . Financial resource strain: Not on file  . Food insecurity:    Worry: Not on file    Inability: Not on file  . Transportation needs:    Medical: Not on file    Non-medical: Not on file  Tobacco Use  . Smoking status: Never Smoker  . Smokeless tobacco: Never Used  Substance and Sexual Activity  . Alcohol use: No  . Drug use: No  . Sexual activity: Not Currently  Lifestyle  . Physical activity:    Days per week: Not on file    Minutes per session: Not on file  . Stress: Not on file  Relationships  . Social connections:    Talks on phone: Not on file    Gets together: Not on file    Attends religious service: Not on file    Active member of club or organization: Not on file    Attends meetings of clubs or organizations: Not on file    Relationship status: Not on file  Other Topics Concern  . Not on file  Social History Narrative  . Not on file   Family History  Problem Relation Age of Onset  . Cancer Sister   . Diabetes Mellitus II Mother   . Tuberculosis Father    Scheduled Meds: . amLODipine  10 mg Oral Daily  . calcium carbonate  1 tablet Oral Daily  . diltiazem  120 mg Oral Q8H  . enoxaparin (LOVENOX) injection  40 mg Subcutaneous Q24H  . famotidine  20 mg Oral BID  . feeding supplement (GLUCERNA SHAKE)  237 mL Oral TID BM  . feeding supplement (PRO-STAT SUGAR FREE 64)  30 mL Oral BID BM  . ferrous sulfate  325 mg Oral BID WC  . fluticasone  1 spray Each Nare Daily  . hydrALAZINE  100 mg  Oral TID  . lactulose  20 g Oral BID  . levothyroxine  224 mcg Oral QAC breakfast  . sodium chloride flush  3 mL Intravenous Q12H   Continuous Infusions: . sodium chloride Stopped (10/13/17 0511)  . sodium chloride 10 mL/hr at 10/13/17 0652  . piperacillin-tazobactam (ZOSYN)  IV 3.375 g (10/13/17 0511)   PRN Meds:.sodium chloride, sodium chloride, acetaminophen **OR** acetaminophen, albuterol, ondansetron **OR** ondansetron (ZOFRAN) IV, oxyCODONE, sodium chloride flush Medications Prior to  Admission:  Prior to Admission medications   Medication Sig Start Date End Date Taking? Authorizing Provider  acetaminophen (TYLENOL) 325 MG tablet Take 2 tablets (650 mg total) by mouth every 6 (six) hours as needed for fever. 09/19/15  Yes Theodis Blaze, MD  albuterol (PROVENTIL) (2.5 MG/3ML) 0.083% nebulizer solution Take 3 mLs (2.5 mg total) by nebulization every 4 (four) hours as needed for wheezing or shortness of breath. 09/06/17  Yes Georgette Shell, MD  amLODipine (NORVASC) 10 MG tablet Take 1 tablet (10 mg total) by mouth daily. 09/07/17  Yes Georgette Shell, MD  calcium carbonate (TUMS - DOSED IN MG ELEMENTAL CALCIUM) 500 MG chewable tablet Chew 1 tablet by mouth daily.   Yes [provider]  chlorhexidine (PERIDEX) 0.12 % solution Use as directed 15 mLs in the mouth or throat 2 (two) times daily.   Yes [provider]  cholecalciferol (VITAMIN D) 400 units TABS tablet Take 400 Units by mouth daily.   Yes [provider]  diltiazem (CARDIZEM) 120 MG tablet Take 1 tablet (120 mg total) by mouth every 8 (eight) hours. 09/06/17  Yes Georgette Shell, MD  famotidine (PEPCID) 20 MG tablet Take 1 tablet (20 mg total) by mouth 2 (two) times daily. 09/06/17  Yes Georgette Shell, MD  ferrous sulfate 325 (65 FE) MG tablet Take 325 mg by mouth 2 (two) times daily with a meal.   Yes [provider]  fluticasone (FLONASE) 50 MCG/ACT nasal spray Place 1 spray  into both nostrils daily. 09/07/17  Yes Georgette Shell, MD  hydrALAZINE (APRESOLINE) 100 MG tablet Take 100 mg by mouth 3 (three) times daily.   Yes [provider]  ipratropium-albuterol (DUONEB) 0.5-2.5 (3) MG/3ML SOLN Take 3 mLs by nebulization 2 (two) times daily. 09/06/17  Yes Georgette Shell, MD  lactulose [facility]) 10 GM/15ML solution Take 20 g by mouth 2 (two) times daily.   Yes [provider]  levothyroxine (SYNTHROID, LEVOTHROID) 112 MCG tablet Take 224 mcg by mouth daily before breakfast.    Yes [provider]  lidocaine-prilocaine (EMLA) cream Apply to affected area once Patient taking differently: Apply 1 application topically 2 (two) times daily.  07/24/17  Yes Perlov, Marinell Blight, MD  Menthol-Methyl Salicylate (MUSCLE RUB) 10-15 % CREA Apply 1 application topically as needed for muscle pain. 10/04/17  Yes Eugenie Filler, MD  Multiple Vitamin (MULTIVITAMIN) tablet Take 1 tablet by mouth daily.   Yes [provider]  Omega-3 Fatty Acids (FISH OIL) 1000 MG CAPS Take 4,000 mg by mouth daily.   Yes [provider]  ondansetron (ZOFRAN) 4 MG tablet Take 1 tablet (4 mg total) by mouth every 6 (six) hours as needed for nausea. Patient taking differently: Take 4 mg by mouth every 4 (four) hours.  09/06/17  Yes Georgette Shell, MD  Oxycodone HCl 10 MG TABS Take 1 tablet (10 mg total) by mouth every 6 (six) hours as needed for up to 14 days (pain). Patient taking differently: Take 10 mg by mouth every 6 (six) hours as needed (pain). Started 10/05/17 10/04/17 10/18/17 Yes Eugenie Filler, MD  senna (SENOKOT) 8.6 MG TABS tablet Take 2 tablets by mouth 2 (two) times daily as needed for mild constipation.   Yes [provider]  vitamin B-12 (CYANOCOBALAMIN) 500 MCG tablet Take 500 mcg by mouth daily.   Yes [provider]  Amino Acids-Protein Hydrolys (FEEDING SUPPLEMENT, PRO-STAT SUGAR FREE 64,) LIQD Take  30 mLs by mouth  2 (two) times daily between meals. Patient not taking: Reported on 10/10/2017 10/05/17   Eugenie Filler, MD  feeding supplement, GLUCERNA SHAKE, (GLUCERNA SHAKE) LIQD Take 237 mLs by mouth 3 (three) times daily between meals. Patient not taking: Reported on 10/10/2017 09/06/17   Georgette Shell, MD   Allergies  Allergen Reactions  . Benadryl [Diphenhydramine] Swelling  . Ciprofloxacin Hives  . Furosemide Itching, Swelling, Rash and Other (See Comments)    Made edema worse  . Nitrofurantoin Monohyd Macro Hives  . Septra [Bactrim] Hives  . Sulfa Drugs Cross Reactors Hives  . Allopurinol Itching  . Ceclor [Cefaclor] Rash    Breaks out in rash chest down.   Review of Systems Denies pain, anxiety, SOB, nausea.  Endorses "just not hungry".  Poor historian.  Physical Exam  General: Alert, awake, in no acute distress. Frail and chronically ill appearing. HEENT: No bruits, no goiter, no JVD Heart: Regular rate and rhythm. No murmur appreciated. Lungs: Decreased air movement, Rales on left at base Abdomen: Soft, nontender, nondistended, positive bowel sounds.  Ext: Significant edema with wrap on LE bilaterally Skin: Warm and dry  Vital Signs: BP (!) 144/69 (BP Location: Left Arm)   Pulse 91   Temp 99.1 F (37.3 C) (Oral)   Resp 17   Ht 5' 4" (1.626 m)   Wt 86.9 kg (191 lb 9.3 oz)   SpO2 94%   BMI 32.88 kg/m  Pain Scale: 0-10 POSS *See Group Information*: S-Acceptable,Sleep, easy to arouse Pain Score: Asleep   SpO2: SpO2: 94 % O2 Device:SpO2: 94 % O2 Flow Rate: .O2 Flow Rate (L/min): 8 L/min  IO: Intake/output summary:   Intake/Output Summary (Last 24 hours) at 10/13/2017 2355 Last data filed at 10/13/2017 7322 Gross per 24 hour  Intake 1344.28 ml  Output 950 ml  Net 394.28 ml    LBM: Last BM Date: 10/10/17 Baseline Weight: Weight: 83.9 kg (185 lb) Most recent weight: Weight: 86.9 kg (191 lb 9.3 oz)     Palliative Assessment/Data:   Flowsheet Rows     Most  Recent Value  Intake Tab  Referral Department  Hospitalist  Unit at Time of Referral  Med/Surg Unit  Palliative Care Primary Diagnosis  Sepsis/Infectious Disease  Date Notified  10/10/17  Palliative Care Type  New Palliative care  Reason for referral  Clarify Goals of Care  Date of Admission  10/10/17  Date first seen by Palliative Care  10/12/17  # of days Palliative referral response time  2 Day(s)  # of days IP prior to Palliative referral  0  Clinical Assessment  Palliative Performance Scale Score  20%  Psychosocial & Spiritual Assessment  Palliative Care Outcomes  Patient/Family meeting held?  Yes  Who was at the meeting?  daughter, 2 sons  Palliative Care Outcomes  Clarified goals of care      Time In: 1500 Time Out: 1625 Time Total: 85 Greater than 50%  of this time was spent counseling and coordinating care related to the above assessment and plan.  Signed by: Micheline Rough, MD   Please contact Palliative Medicine Team phone at [phone] for questions and concerns.  For individual provider: See Shea Evans

## 2017-10-13 NOTE — Progress Notes (Signed)
[facility]  [facility])  Received request from [place] for family interest in [place] transfer 10/14/17. Chart reviewed and eligibility confirmed. Spoke with daughter by phone and plan to meet her after 5 PM today to complete paper work.   Will need discharge summary sent to [phone].  RN please call report tomorrow before patient transfers to [phone].  Thank you,  Erling Conte, LCSW [phone]

## 2017-10-13 NOTE — Progress Notes (Signed)
Daily Progress Note   Patient Name: Emily Kennedy       Date: 10/13/2017 DOB: 1945-06-07  Age: 72 y.o. MRN#: [id number] Attending Physician: Dessa Phi, DO Primary Care Physician: [name], MD Admit Date: 10/10/2017  Reason for Consultation/Follow-up: Establishing goals of care and Terminal Care  Subjective: Discussed with bedside RN and I saw and examined Emily Kennedy this afternoon.  Her pastor was present at the bedside.  She denies complaints and denies being hungry or thirsty.   Bedside care team report minimal PO intake and she is not even finishing some medications (ie lactulose) at this point.  Length of Stay: 3  Current Medications: Scheduled Meds:  . albuterol  2.5 mg Nebulization BID  . amLODipine  10 mg Oral Daily  . calcium carbonate  1 tablet Oral Daily  . diltiazem  120 mg Oral Q8H  . enoxaparin (LOVENOX) injection  40 mg Subcutaneous Q24H  . famotidine  20 mg Oral BID  . feeding supplement (GLUCERNA SHAKE)  237 mL Oral TID BM  . feeding supplement (PRO-STAT SUGAR FREE 64)  30 mL Oral BID BM  . ferrous sulfate  325 mg Oral BID WC  . fluticasone  1 spray Each Nare Daily  . hydrALAZINE  100 mg Oral TID  . lactulose  20 g Oral BID  . levothyroxine  224 mcg Oral QAC breakfast  . sodium chloride flush  3 mL Intravenous Q12H    Continuous Infusions: . sodium chloride 10 mL/hr at 10/13/17 1129  . sodium chloride 10 mL/hr at 10/13/17 1129  . piperacillin-tazobactam (ZOSYN)  IV 3.375 g (10/13/17 1346)    PRN Meds: sodium chloride, sodium chloride, acetaminophen **OR** acetaminophen, albuterol, ondansetron **OR** ondansetron (ZOFRAN) IV, oxyCODONE, sodium chloride flush  Physical Exam         General: Frail and chronically ill appearing.  Confused with no  insight. HEENT: No bruits, no goiter, no JVD Heart: Regular rate and rhythm. No murmur appreciated. Lungs: Decreased air movement, Rales on left at base Abdomen: Soft, nontender, nondistended, positive bowel sounds.  Ext: Significant edema with wrap on LE bilaterally Skin: Warm and dry     Vital Signs: BP (!) 149/64 (BP Location: Right Arm)   Pulse 92   Temp 98 F (36.7 C) (Oral)   Resp 16  Ht 5\' 4"  (1.626 m)   Wt 86.9 kg (191 lb 9.3 oz)   SpO2 98%   BMI 32.88 kg/m  SpO2: SpO2: 98 % O2 Device: O2 Device: Room Air O2 Flow Rate: O2 Flow Rate (L/min): 8 L/min  Intake/output summary:   Intake/Output Summary (Last 24 hours) at 10/13/2017 1807 Last data filed at 10/13/2017 1337 Gross per 24 hour  Intake 753.09 ml  Output 600 ml  Net 153.09 ml   LBM: Last BM Date: 10/10/17 Baseline Weight: Weight: 83.9 kg (185 lb) Most recent weight: Weight: 86.9 kg (191 lb 9.3 oz)       Palliative Assessment/Data:    Flowsheet Rows     Most Recent Value  Intake Tab  Referral Department  Hospitalist  Unit at Time of Referral  Med/Surg Unit  Palliative Care Primary Diagnosis  Sepsis/Infectious Disease  Date Notified  10/10/17  Palliative Care Type  New Palliative care  Reason for referral  Clarify Goals of Care  Date of Admission  10/10/17  Date first seen by Palliative Care  10/12/17  # of days Palliative referral response time  2 Day(s)  # of days IP prior to Palliative referral  0  Clinical Assessment  Palliative Performance Scale Score  20%  Psychosocial & Spiritual Assessment  Palliative Care Outcomes  Patient/Family meeting held?  Yes  Who was at the meeting?  daughter, 2 sons  Palliative Care Outcomes  Clarified goals of care      Patient Active Problem List   Diagnosis Date Noted  . Constipation 10/04/2017  . Lymphedema 10/04/2017  . ARF (acute renal failure) ([place]) 09/27/2017  . Pancytopenia ([place]) 09/27/2017  . Abdominal distension   . Anasarca 09/01/2017  .  Thrombocytopenia ([place]) 09/01/2017  . Malignant lymphoma ([place]) 09/01/2017  . Sepsis secondary to UTI ([place]) 09/01/2017  . 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 MDS (myelodysplastic syndrome) ([place]) 07/14/2017  . Solitary pulmonary nodule 02/15/2016  . CHF (congestive heart failure) ([place]) 02/14/2016  . Malnutrition of moderate degree 09/18/2015  . Benign essential HTN   . Vasculitis ([place])   . Rheumatoid arthritis ([place])   . History of recent hospitalization   . Acute blood loss anemia   . Anemia of chronic disease   . Hyponatremia 09/15/2015  . HCAP (healthcare-associated pneumonia) 09/14/2015  . Respiratory arrest ([place]) intubated 4/13 post arrest 06/27/2015  . Cardiac arrest ([place]) 4/13 suspected mucus plugging as cause of arrest 06/27/2015  . Controlled diabetes mellitus type 2 with complications ([place])   . Dysphagia   . Atrial fibrillation with RVR ([place]) 06/18/2015  . Chronic diastolic CHF (congestive heart failure) ([place])   . Acute encephalopathy 06/14/2015  . Anemia, chronic disease 05/18/2015  . Hypothyroidism 05/18/2015  . Essential hypertension 05/18/2015  . Diabetes mellitus type 2, uncontrolled ([place]) 04/09/2015  . 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    Palliative Care Assessment & Plan   Patient Profile: 72 y.o. female  with past medical history of DM2, myelodysplastic syndrome, lymphoplastic lymphoma, MGUS, RA, HTN, HLD, CKD 3, chronic lymphedema, hypothyroid, and living at [facility] admitted on 10/10/2017 with sepsis related to PNA and UTI.  Palliative consulted for goals of care.   Assessment: Patient Active Problem List   Diagnosis Date Noted  . Constipation 10/04/2017  . Lymphedema 10/04/2017  . ARF (acute renal failure) ([place]) 09/27/2017  . Pancytopenia ([place]) 09/27/2017  . Abdominal distension   . Anasarca 09/01/2017  .  Thrombocytopenia ([place]) 09/01/2017   . Malignant lymphoma ([place]) 09/01/2017  . Sepsis secondary to UTI ([place]) 09/01/2017  . 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 MDS (myelodysplastic syndrome) ([place]) 07/14/2017  . Solitary pulmonary nodule 02/15/2016  . CHF (congestive heart failure) ([place]) 02/14/2016  . Malnutrition of moderate degree 09/18/2015  . Benign essential HTN   . Vasculitis ([place])   . Rheumatoid arthritis ([place])   . History of recent hospitalization   . Acute blood loss anemia   . Anemia of chronic disease   . Hyponatremia 09/15/2015  . HCAP (healthcare-associated pneumonia) 09/14/2015  . Respiratory arrest ([place]) intubated 4/13 post arrest 06/27/2015  . Cardiac arrest ([place]) 4/13 suspected mucus plugging as cause of arrest 06/27/2015  . Controlled diabetes mellitus type 2 with complications ([place])   . Dysphagia   . Atrial fibrillation with RVR ([place]) 06/18/2015  . Chronic diastolic CHF (congestive heart failure) ([place])   . Acute encephalopathy 06/14/2015  . Anemia, chronic disease 05/18/2015  . Hypothyroidism 05/18/2015  . Essential hypertension 05/18/2015  . Diabetes mellitus type 2, uncontrolled ([place]) 04/09/2015  . 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   Recommendations/Plan:  Recommendation evaluation for transition to residential hospice for end of life care.   Code Status:    Code Status Orders  (From admission, onward)        Start     Ordered   10/10/17 1446  Do not attempt resuscitation (DNR)  Continuous    Question Answer Comment  In the event of cardiac or respiratory ARREST Do not call a "code blue"   In the event of cardiac or respiratory ARREST Do not perform Intubation, CPR, defibrillation or ACLS   In the event of cardiac or respiratory ARREST Use medication by any route, position, wound care, and other measures to relive pain and suffering. May use oxygen, suction and  manual treatment of airway obstruction as needed for comfort.      10/10/17 1446    Code Status History    Date Active Date Inactive Code Status Order ID Comments User Context   09/27/2017 1851 10/05/2017 2230 DNR [id number]  [name], MD ED   08/31/2017 2151 09/07/2017 0028 DNR [id number]  Norval Morton, MD ED   02/14/2016 2017 02/16/2016 1902 Full Code [id number]  Domenic Polite, MD Inpatient   09/14/2015 2122 09/19/2015 1838 Full Code [id number]  Reubin Milan, MD Inpatient   06/23/2015 2309 07/20/2015 1755 Full Code [id number]  Corey Harold Inpatient   06/14/2015 0321 06/23/2015 2309 Full Code [id number]  Edwin Dada, MD ED   05/17/2015 2246 05/20/2015 1934 Full Code [id number]  Norval Morton, MD Inpatient   04/09/2015 1449 04/11/2015 1702 Full Code [id number]  Cristal Ford, DO Inpatient   04/09/2015 0557 04/09/2015 1449 DNR [id number]  Rise Patience, MD Inpatient    Advance Directive Documentation     Most Recent Value  Type of Advance Directive  Out of facility DNR (pink MOST or yellow form), Living will  Pre-existing out of facility DNR order (yellow form or pink MOST form)  Yellow form placed in chart (order not valid for inpatient use)  "MOST" Form in Place?  -       Prognosis:   < 2 weeks- Emily Kennedy has advanced cancer with poor functional status and no plan for further disease modifying therapy.  She has been hospitalized twice for PNA/UTI and once for renal failure in  the last month.  With the fact that she is not eating or drinking and a a focus on comfort, her prognosis is likely less than 2 weeks.  She would be best served by residential hospice if it can be arranged.  Discharge Planning:  Hospice facility  Care plan was discussed with RN, Dr. Maylene Roes  Thank you for allowing the Palliative Medicine Team to assist in the care of this patient.   Total Time 30 Prolonged Time Billed No      Greater than 50%  of this time was spent counseling and  coordinating care related to the above assessment and plan.  Micheline Rough, MD  Please contact Palliative Medicine Team phone at [phone] for questions and concerns.

## 2017-10-13 NOTE — Progress Notes (Signed)
Pharmacy Antibiotic Note  Emily Kennedy is a 72 y.o. female admitted from SNF on 10/10/2017 with sepsis. She has had recent admission for aspiration pneumonia, and UTI related to indwelling foley catheter.  Other PMH includes MDS with lymphoma (chemo Rituxan/Bendamustine last given on 6/18), CHF, DM, HTN,  Pharmacy has been consulted for Vancomycin and Zosyn dosing. Vancomycin has been stopped  Noted abx allergies, but has recently tolerated Zosyn and Cefepime.  Today, 10/13/2017 Day #3 zosyn  Afebrile  WBC WNL  Renal fx stable  No cultures  MRSA PCR negative  Plan:  Continue zosyn 3.375gm IV q8h over 4h infusion   Follow up renal function, culture results, and clinical course.  F/u ability to de-escalate antibiotics.   Height: 5\' 4"  (162.6 cm) Weight: 191 lb 9.3 oz (86.9 kg) IBW/kg (Calculated) : 54.7  Temp (24hrs), Avg:99.2 F (37.3 C), Min:99.1 F (37.3 C), Max:99.5 F (37.5 C)  Recent Labs  Lab 10/10/17 1227 10/11/17 0522 10/12/17 0447 10/13/17 0500  WBC 8.2 8.5 7.5 6.0  CREATININE 1.22* 1.16* 1.24* 1.19*    Estimated Creatinine Clearance: 46.9 mL/min (A) (by C-G formula based on SCr of 1.19 mg/dL (H)).    Allergies  Allergen Reactions  . Benadryl [Diphenhydramine] Swelling  . Ciprofloxacin Hives  . Furosemide Itching, Swelling, Rash and Other (See Comments)    Made edema worse  . Nitrofurantoin Monohyd Macro Hives  . Septra [Bactrim] Hives  . Sulfa Drugs Cross Reactors Hives  . Allopurinol Itching  . Ceclor [Cefaclor] Rash    Breaks out in rash chest down.    Antimicrobials this admission: 7/29 Vancomycin >> 7/29 Zosyn >>   Dose adjustments this admission:   Microbiology results:  Thank you for allowing pharmacy to be a part of this patient's care.  Doreene Eland, PharmD, BCPS.   Pager: [phone] 10/13/2017 11:50 AM

## 2017-10-13 NOTE — Progress Notes (Addendum)
PROGRESS NOTE    Emily Kennedy  QMG:[id number] DOB: 25-Aug-1945 DOA: 10/10/2017 PCP: [name], MD     Brief Narrative:  Emily Kennedy is a 72 y.o.femalewith medical history ofDM2, myelodysplastic syndrome, lymphoplasmocytic lymphoma, MGUS, rheumatoid arthritis, HTN, HLD, CKD 3, chronic lymphedema, hypothyroidism who lives in a SNF.  Patient was sent to the emergency department due to fever of 101 associated with nausea and vomiting for 2 days prior to admission.  Upon ED evaluation chest x-ray shows left lower lobe consolidation with bilateral pleural effusions, and UA grossly abnormal consistent with UTI.  Patient was admitted with working diagnosis of sepsis due to presumed UTI and pneumonia.   New events last 24 hours / Subjective: No new issues. Denies any pain.   Assessment & Plan:   Principal Problem:   Sepsis secondary to UTI [facility]) Active Problems:   MGUS (monoclonal gammopathy of unknown significance)   HCAP (healthcare-associated pneumonia)   Anemia of chronic disease   MDS (myelodysplastic syndrome) (HCC)   Malignant lymphoma, lymphoplasmacytic (HCC)   Thrombocytopenia (HCC)  Sepsis secondary to CAUTI (POA) and left lobar HCAP pneumonia  -UA grossly abnormal with large leukocyte and positive nitrates.  Likely CAUTI, patient with chronic foley. Unfortunately urine and blood cultures were not obtained prior to admission and patient was placed on empiric antibiotic therapy.  For now will monitor and treat clinically.  Previous urine cultures in 2018-2019 have been unhelpful.  -Chest x-ray shows left lower lobe pneumonia.  She is on vancomycin and Zosyn, will DC vancomycin as MRSA is negative.  -Continue zosyn   CKD stage III -Baseline Cr ~1.4 -Stable   Anemia of chronic disease -On admission Hgb was higher than baseline, likely volume contraction -Baseline Hgb ~7-8 -Stable without signs of bleeding   MGUS/Myelodysplastic syndrome/Thrombocytopenia/Malignant  Lymphoma  -No on treatment, per family Dr Irene Limbo has stopped chemo  -Palliative care consulted   Cirrhosis/Splenomegaly and ascites  -Last paracentesis 09/02/17, cont to monitor  -LFT unchanged, INR normal, ammonia normal -Resume home lactulose   Essential hypertension -Continue Norvasc, Cardizem  Hypothyroidism -Continue Synthroid   DVT prophylaxis: Lovenox Code Status: DNR Family Communication: No family at bedside Disposition Plan: Discussed with Dr. Domingo Cocking today. This is her third hospitalization since June 2019 for pneumonia, UTI, AKI. She has very poor oral intake and has been declining over some time. It would be reasonable to pursue residential hospice at this point. SW referral.    Consultants:   Palliative care  Procedures:   None  Antimicrobials:  Anti-infectives (From admission, onward)   Start     Dose/Rate Route Frequency Ordered Stop   10/11/17 1400  vancomycin (VANCOCIN) IVPB 750 mg/150 ml premix  Status:  Discontinued     750 mg 150 mL/hr over 60 Minutes Intravenous Every 24 hours 10/10/17 1541 10/11/17 1701   10/10/17 2200  piperacillin-tazobactam (ZOSYN) IVPB 3.375 g     3.375 g 12.5 mL/hr over 240 Minutes Intravenous Every 8 hours 10/10/17 1559     10/10/17 1415  vancomycin (VANCOCIN) 1,500 mg in sodium chloride 0.9 % 500 mL IVPB     1,500 mg 250 mL/hr over 120 Minutes Intravenous STAT 10/10/17 1401 10/10/17 1746   10/10/17 1400  aztreonam (AZACTAM) 2 g in sodium chloride 0.9 % 100 mL IVPB     2 g 200 mL/hr over 30 Minutes Intravenous  Once 10/10/17 1345 10/10/17 1436       Objective: Vitals:   10/12/17 1318 10/12/17 2103 10/13/17 0516 10/13/17  1041  BP: 137/66 (!) 148/63 (!) 144/69 135/66  Pulse: 90 94 91   Resp: 16 14 17    Temp: 99.1 F (37.3 C) 99.5 F (37.5 C) 99.1 F (37.3 C)   TempSrc: Oral Oral Oral   SpO2: 94% 95% 94%   Weight:      Height:        Intake/Output Summary (Last 24 hours) at 10/13/2017 1145 Last data filed at  10/13/2017 1129 Gross per 24 hour  Intake 1173.09 ml  Output 950 ml  Net 223.09 ml   Filed Weights   10/10/17 1222 10/10/17 1626  Weight: 83.9 kg (185 lb) 86.9 kg (191 lb 9.3 oz)    Examination: General exam: Appears calm and comfortable  Respiratory system: Clear to auscultation. Respiratory effort normal. Cardiovascular system: S1 & S2 heard, RRR. No JVD, murmurs, rubs, gallops or clicks. No pedal edema. Gastrointestinal system: Abdomen is nondistended, soft and nontender. No organomegaly or masses felt. Normal bowel sounds heard. Central nervous system: Alert and oriented. No focal neurological deficits. Extremities: Symmetric 5 x 5 power. Skin: No rashes, lesions or ulcers Psychiatry: Judgement and insight appear stable but poor    Data Reviewed: I have personally reviewed following labs and imaging studies  CBC: Recent Labs  Lab 10/10/17 1227 10/11/17 0522 10/12/17 0447 10/13/17 0500  WBC 8.2 8.5 7.5 6.0  NEUTROABS 7.2  --  6.2  --   HGB 10.3* 7.2* 7.7* 7.7*  HCT 31.1* 21.2* 23.0* 23.3*  MCV 85.4 84.5 85.2 85.7  PLT 99* 120* 144* 353   Basic Metabolic Panel: Recent Labs  Lab 10/10/17 1227 10/11/17 0522 10/12/17 0447 10/13/17 0500  NA 133* 132* 133* 135  K 4.8 4.8 4.6 4.4  CL 105 105 105 107  CO2 23 23 24 22   GLUCOSE 118* 107* 119* 94  BUN 30* 30* 30* 29*  CREATININE 1.22* 1.16* 1.24* 1.19*  CALCIUM 7.6* 7.6* 7.8* 8.0*   GFR: Estimated Creatinine Clearance: 46.9 mL/min (A) (by C-G formula based on SCr of 1.19 mg/dL (H)). Liver Function Tests: Recent Labs  Lab 10/10/17 1227 10/12/17 0447  AST 17 13*  ALT 13 10  ALKPHOS 79 74  BILITOT 0.2* 0.7  PROT 4.4* 4.5*  ALBUMIN 2.0* 1.8*   Recent Labs  Lab 10/10/17 1227  LIPASE 25   Recent Labs  Lab 10/12/17 0535  AMMONIA 15   Coagulation Profile: Recent Labs  Lab 10/12/17 0447  INR 1.07   Cardiac Enzymes: No results for input(s): CKTOTAL, CKMB, CKMBINDEX, TROPONINI in the last 168  hours. BNP (last 3 results) No results for input(s): PROBNP in the last 8760 hours. HbA1C: No results for input(s): HGBA1C in the last 72 hours. CBG: No results for input(s): GLUCAP in the last 168 hours. Lipid Profile: No results for input(s): CHOL, HDL, LDLCALC, TRIG, CHOLHDL, LDLDIRECT in the last 72 hours. Thyroid Function Tests: No results for input(s): TSH, T4TOTAL, FREET4, T3FREE, THYROIDAB in the last 72 hours. Anemia Panel: No results for input(s): VITAMINB12, FOLATE, FERRITIN, TIBC, IRON, RETICCTPCT in the last 72 hours. Sepsis Labs: Recent Labs  Lab 10/11/17 2044  PROCALCITON 0.93    Recent Results (from the past 240 hour(s))  MRSA PCR Screening     Status: None   Collection Time: 10/11/17  9:31 AM  Result Value Ref Range Status   MRSA by PCR NEGATIVE NEGATIVE Final    Comment:        The GeneXpert MRSA Assay (FDA approved for NASAL specimens only),  is one component of a comprehensive MRSA colonization surveillance program. It is not intended to diagnose MRSA infection nor to guide or monitor treatment for MRSA infections. Performed at [facility], [place] [address] [place], [place] [address]        Radiology Studies: No results found.    Scheduled Meds: . amLODipine  10 mg Oral Daily  . calcium carbonate  1 tablet Oral Daily  . diltiazem  120 mg Oral Q8H  . enoxaparin (LOVENOX) injection  40 mg Subcutaneous Q24H  . famotidine  20 mg Oral BID  . feeding supplement (GLUCERNA SHAKE)  237 mL Oral TID BM  . feeding supplement (PRO-STAT SUGAR FREE 64)  30 mL Oral BID BM  . ferrous sulfate  325 mg Oral BID WC  . fluticasone  1 spray Each Nare Daily  . hydrALAZINE  100 mg Oral TID  . lactulose  20 g Oral BID  . levothyroxine  224 mcg Oral QAC breakfast  . sodium chloride flush  3 mL Intravenous Q12H   Continuous Infusions: . sodium chloride 10 mL/hr at 10/13/17 1129  . sodium chloride 10 mL/hr at 10/13/17 1129  .  piperacillin-tazobactam (ZOSYN)  IV 3.375 g (10/13/17 0511)     LOS: 3 days    Time spent: 20 minutes   Dessa Phi, DO [facility] [email or web address] Password [email or web address] 10/13/2017, 11:45 AM

## 2017-10-13 NOTE — Care Management Important Message (Signed)
Important Message  Patient Details  Name: Emily Kennedy MRN: [id number] Date of Birth: 1945/11/27   Medicare Important Message Given:  Yes    Kerin Salen 10/13/2017, 10:50 AMImportant Message  Patient Details  Name: Emily Kennedy MRN: [id number] Date of Birth: 1945/12/16   Medicare Important Message Given:  Yes    Kerin Salen 10/13/2017, 10:50 AM

## 2017-10-13 NOTE — Progress Notes (Addendum)
3:33 PM Patient approved for hospice. Patient can transport in the AM.   LCSW made referral to [facility].   LCSW will follow disposition.    Carolin Coy [place] [facility] [place]

## 2017-10-14 LAB — CBC
HCT: 26.1 % — ABNORMAL LOW (ref 36.0–46.0)
HEMOGLOBIN: 8.6 g/dL — AB (ref 12.0–15.0)
MCH: 28.1 pg (ref 26.0–34.0)
MCHC: 33 g/dL (ref 30.0–36.0)
MCV: 85.3 fL (ref 78.0–100.0)
PLATELETS: 191 10*3/uL (ref 150–400)
RBC: 3.06 MIL/uL — ABNORMAL LOW (ref 3.87–5.11)
RDW: 15.9 % — ABNORMAL HIGH (ref 11.5–15.5)
WBC: 11.1 10*3/uL — AB (ref 4.0–10.5)

## 2017-10-14 LAB — BASIC METABOLIC PANEL
Anion gap: 9 (ref 5–15)
BUN: 29 mg/dL — AB (ref 8–23)
CHLORIDE: 105 mmol/L (ref 98–111)
CO2: 23 mmol/L (ref 22–32)
Calcium: 8.1 mg/dL — ABNORMAL LOW (ref 8.9–10.3)
Creatinine, Ser: 1.18 mg/dL — ABNORMAL HIGH (ref 0.44–1.00)
GFR calc Af Amer: 52 mL/min — ABNORMAL LOW (ref >60)
GFR, EST NON AFRICAN AMERICAN: 45 mL/min — AB (ref >60)
Glucose, Bld: 117 mg/dL — ABNORMAL HIGH (ref 70–99)
POTASSIUM: 4.2 mmol/L (ref 3.5–5.1)
SODIUM: 137 mmol/L (ref 135–145)

## 2017-10-14 MED ORDER — HEPARIN SOD (PORK) LOCK FLUSH 100 UNIT/ML IV SOLN: 500.0000 [IU] | Freq: Once | INTRAVENOUS | Status: DC

## 2017-10-14 NOTE — Progress Notes (Signed)
Patient to transfer to [facility].   LCSW faxed dc docs to facility.   Patient will transport by [facility].   Family notified of transfer.   RN report #: [id number].  Carolin Coy [place] [facility] [place]

## 2017-10-14 NOTE — Discharge Summary (Signed)
Physician Discharge Summary  Emily Kennedy UJW:[id number] DOB: 03/12/1946 DOA: 10/10/2017  PCP: [name], MD  Admit date: 10/10/2017 Discharge date: 10/14/2017  Admitted From: SNF Disposition:  Residential hospice   CODE STATUS: DNR  Diet recommendation: Comfort   Brief/Interim Summary: Emily Kennedy 72 y.o.femalewith medical history ofDM2, myelodysplastic syndrome, lymphoplasmocytic lymphoma, MGUS, rheumatoid arthritis, HTN, HLD, CKD 3, chronic lymphedema, hypothyroidism who lives in a SNF.Patient was sent to the emergency department due to fever of 101 associated with nausea and vomiting for 2 days prior to admission. Upon ED evaluation chest x-ray shows left lower lobe consolidation with bilateral pleural effusions, and UA grossly abnormal consistent with UTI. Patient was admitted with working diagnosis of sepsis due to presumed UTI and pneumonia. She was treated with IV antibiotics. Due to patient's poor PO intake and frequent hospitalizations, she and family met with palliative care and decided to transition to residential hospice.  Discharge Diagnoses:  Principal Problem:   Sepsis secondary to UTI [facility]) Active Problems:   MGUS (monoclonal gammopathy of unknown significance)   HCAP (healthcare-associated pneumonia)   Anemia of chronic disease   MDS (myelodysplastic syndrome) (HCC)   Malignant lymphoma, lymphoplasmacytic (HCC)   Thrombocytopenia (HCC)  Sepsis secondary to CAUTI (POA) and left lobar HCAP pneumonia  CKD stage III Anemia of chronic disease MGUS/Myelodysplastic syndrome/Thrombocytopenia/Malignant Lymphoma  Cirrhosis/Splenomegaly and ascites  Essential hypertension Hypothyroidism   Discharge Instructions   Allergies as of 10/14/2017      Reactions   Benadryl [diphenhydramine] Swelling   Ciprofloxacin Hives   Furosemide Itching, Swelling, Rash, Other (See Comments)   Made edema worse   Nitrofurantoin Monohyd Macro Hives   Septra [bactrim]  Hives   Sulfa Drugs Cross Reactors Hives   Allopurinol Itching   Ceclor [cefaclor] Rash   Breaks out in rash chest down.      Medication List    STOP taking these medications   acetaminophen 325 MG tablet Commonly known as:  TYLENOL   albuterol (2.5 MG/3ML) 0.083% nebulizer solution Commonly known as:  PROVENTIL   amLODipine 10 MG tablet Commonly known as:  NORVASC   calcium carbonate 500 MG chewable tablet Commonly known as:  TUMS - dosed in mg elemental calcium   chlorhexidine 0.12 % solution Commonly known as:  PERIDEX   cholecalciferol 400 units Tabs tablet Commonly known as:  VITAMIN D   diltiazem 120 MG tablet Commonly known as:  CARDIZEM   famotidine 20 MG tablet Commonly known as:  PEPCID   feeding supplement (GLUCERNA SHAKE) Liqd   feeding supplement (PRO-STAT SUGAR FREE 64) Liqd   ferrous sulfate 325 (65 FE) MG tablet   Fish Oil 1000 MG Caps   fluticasone 50 MCG/ACT nasal spray Commonly known as:  FLONASE   hydrALAZINE 100 MG tablet Commonly known as:  APRESOLINE   ipratropium-albuterol 0.5-2.5 (3) MG/3ML Soln Commonly known as:  DUONEB   lactulose 10 GM/15ML solution Commonly known as:  CHRONULAC   levothyroxine 112 MCG tablet Commonly known as:  SYNTHROID, LEVOTHROID   lidocaine-prilocaine cream Commonly known as:  EMLA   multivitamin tablet   MUSCLE RUB 10-15 % Crea   ondansetron 4 MG tablet Commonly known as:  ZOFRAN   Oxycodone HCl 10 MG Tabs   senna 8.6 MG Tabs tablet Commonly known as:  SENOKOT   vitamin B-12 500 MCG tablet Commonly known as:  CYANOCOBALAMIN       Allergies  Allergen Reactions  . Benadryl [Diphenhydramine] Swelling  . Ciprofloxacin Hives  .  Furosemide Itching, Swelling, Rash and Other (See Comments)    Made edema worse  . Nitrofurantoin Monohyd Macro Hives  . Septra [Bactrim] Hives  . Sulfa Drugs Cross Reactors Hives  . Allopurinol Itching  . Ceclor [Cefaclor] Rash    Breaks out in rash chest  down.    Consultations:  Palliative care    Procedures/Studies: Dg Chest 2 View  Result Date: 10/10/2017 CLINICAL DATA:  Order for fever of 101.  Hx of HTN Diabetes CHF EXAM: CHEST - 2 VIEW COMPARISON:  09/27/2017 FINDINGS: Patient has a RIGHT-sided PowerPort, tip overlying the level of the LOWER superior vena cava or UPPER RIGHT atrium. The heart is enlarged. There is dense opacity at the LEFT lung base which obscures the hemidiaphragm. There is a LEFT pleural effusion. Small RIGHT pleural effusion is also present. IMPRESSION: 1. Cardiomegaly. 2. Bilateral pleural effusions and LEFT LOWER lobe consolidation. Electronically Signed   By: Nolon Nations M.D.   On: 10/10/2017 13:36     Discharge Exam: Vitals:   10/14/17 0516 10/14/17 0902  BP: 127/80   Pulse: (!) 102   Resp: 16   Temp: 98.4 F (36.9 C)   SpO2: 98% (!) 89%    General: Pt is alert, awake, not in acute distress Cardiovascular: RRR, S1/S2 +, no rubs, no gallops Respiratory: CTA bilaterally, no wheezing, no rhonchi Abdominal: Soft, NT, ND, bowel sounds + Extremities: +edema    The results of significant diagnostics from this hospitalization (including imaging, microbiology, ancillary and laboratory) are listed below for reference.     Microbiology: Recent Results (from the past 240 hour(s))  MRSA PCR Screening     Status: None   Collection Time: 10/11/17  9:31 AM  Result Value Ref Range Status   MRSA by PCR NEGATIVE NEGATIVE Final    Comment:        The GeneXpert MRSA Assay (FDA approved for NASAL specimens only), is one component of a comprehensive MRSA colonization surveillance program. It is not intended to diagnose MRSA infection nor to guide or monitor treatment for MRSA infections. Performed at [facility], [place] [address] [place], [place] [address]      Labs: BNP (last 3 results) Recent Labs    10/27/16 1050 08/14/17 0901  BNP 72.8 287.6*   Basic Metabolic  Panel: Recent Labs  Lab 10/10/17 1227 10/11/17 0522 10/12/17 0447 10/13/17 0500 10/14/17 0615  NA 133* 132* 133* 135 137  K 4.8 4.8 4.6 4.4 4.2  CL 105 105 105 107 105  CO2 23 23 24 22 23   GLUCOSE 118* 107* 119* 94 117*  BUN 30* 30* 30* 29* 29*  CREATININE 1.22* 1.16* 1.24* 1.19* 1.18*  CALCIUM 7.6* 7.6* 7.8* 8.0* 8.1*   Liver Function Tests: Recent Labs  Lab 10/10/17 1227 10/12/17 0447  AST 17 13*  ALT 13 10  ALKPHOS 79 74  BILITOT 0.2* 0.7  PROT 4.4* 4.5*  ALBUMIN 2.0* 1.8*   Recent Labs  Lab 10/10/17 1227  LIPASE 25   Recent Labs  Lab 10/12/17 0535  AMMONIA 15   CBC: Recent Labs  Lab 10/10/17 1227 10/11/17 0522 10/12/17 0447 10/13/17 0500 10/14/17 0615  WBC 8.2 8.5 7.5 6.0 11.1*  NEUTROABS 7.2  --  6.2  --   --   HGB 10.3* 7.2* 7.7* 7.7* 8.6*  HCT 31.1* 21.2* 23.0* 23.3* 26.1*  MCV 85.4 84.5 85.2 85.7 85.3  PLT 99* 120* 144* 159 191   Cardiac Enzymes: No results for input(s):  CKTOTAL, CKMB, CKMBINDEX, TROPONINI in the last 168 hours. BNP: Invalid input(s): POCBNP CBG: No results for input(s): GLUCAP in the last 168 hours. D-Dimer No results for input(s): DDIMER in the last 72 hours. Hgb A1c No results for input(s): HGBA1C in the last 72 hours. Lipid Profile No results for input(s): CHOL, HDL, LDLCALC, TRIG, CHOLHDL, LDLDIRECT in the last 72 hours. Thyroid function studies No results for input(s): TSH, T4TOTAL, T3FREE, THYROIDAB in the last 72 hours.  Invalid input(s): FREET3 Anemia work up No results for input(s): VITAMINB12, FOLATE, FERRITIN, TIBC, IRON, RETICCTPCT in the last 72 hours. Urinalysis    Component Value Date/Time   COLORURINE AMBER (A) 10/10/2017 1228   APPEARANCEUR TURBID (A) 10/10/2017 1228   LABSPEC 1.015 10/10/2017 1228   PHURINE 5.0 10/10/2017 1228   GLUCOSEU 50 (A) 10/10/2017 1228   HGBUR LARGE (A) 10/10/2017 1228   BILIRUBINUR NEGATIVE 10/10/2017 1228   KETONESUR NEGATIVE 10/10/2017 1228   PROTEINUR 100 (A)  10/10/2017 1228   UROBILINOGEN 1.0 01/06/2011 2252   NITRITE POSITIVE (A) 10/10/2017 1228   LEUKOCYTESUR LARGE (A) 10/10/2017 1228   Sepsis Labs Invalid input(s): PROCALCITONIN,  WBC,  LACTICIDVEN Microbiology Recent Results (from the past 240 hour(s))  MRSA PCR Screening     Status: None   Collection Time: 10/11/17  9:31 AM  Result Value Ref Range Status   MRSA by PCR NEGATIVE NEGATIVE Final    Comment:        The GeneXpert MRSA Assay (FDA approved for NASAL specimens only), is one component of a comprehensive MRSA colonization surveillance program. It is not intended to diagnose MRSA infection nor to guide or monitor treatment for MRSA infections. Performed at [facility], [place] [address], [place],  [address]      Patient was seen and examined on the day of discharge and was found to be in stable condition. Time coordinating discharge: 35 minutes including assessment and coordination of care, as well as examination of the patient.   SIGNED:  Dessa Phi, DO [facility] Pager [phone]  If 7PM-7AM, please contact night-coverage [email or web address] Password [facility] 10/14/2017, 9:23 AM

## 2017-10-14 NOTE — Progress Notes (Addendum)
Report given to Nira Conn, Therapist, sports at [facility]. Pt to transport with PAC accessed and chronic foley intact per hospice team.

## 2017-10-25 ENCOUNTER — Inpatient Hospital Stay

## 2017-10-25 ENCOUNTER — Inpatient Hospital Stay: Admitting: Hematology

## 2017-10-26 ENCOUNTER — Ambulatory Visit: Payer: Medicare Other

## 2017-11-13 DEATH — deceased

## 2017-11-29 IMAGING — MR MR THORACIC SPINE W/O CM
4 of 7 series · 19 of 48 positions shown · non-contrast
Comparison: Chest radiography 10/16/2012

CLINICAL DATA: Lower extremity weakness. Patient would not allow
performance of lumbar portion of the study.

EXAM:
MRI THORACIC SPINE WITHOUT CONTRAST
TECHNIQUE: Multiplanar, multisequence MR imaging of the thoracic spine was
performed. No intravenous contrast was administered.

[Series 5: T2 · sagittal · 3.0mm · 0.62mm/px · 4 of 12 slices shown (1 of 2)]
[im 1/12]
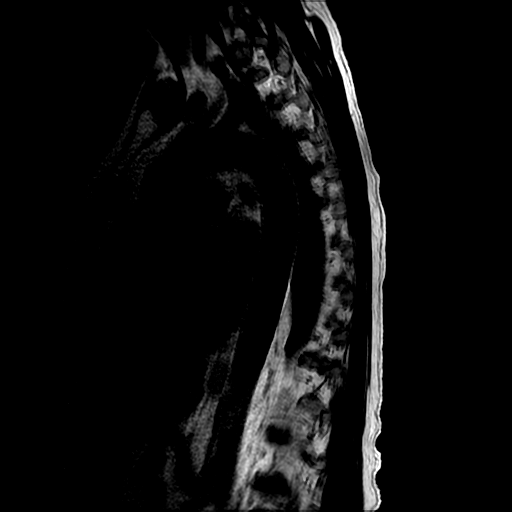
[im 4/12]
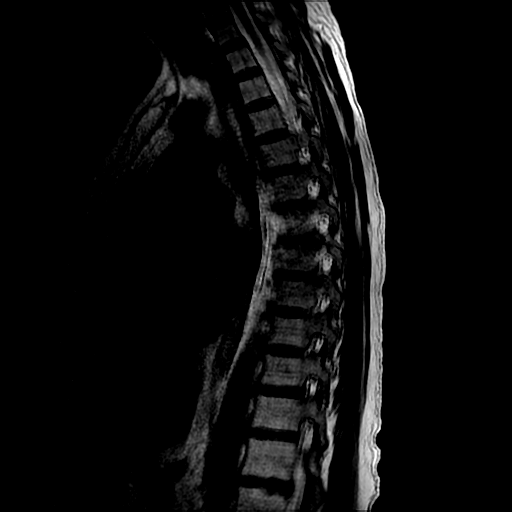
[im 8/12]
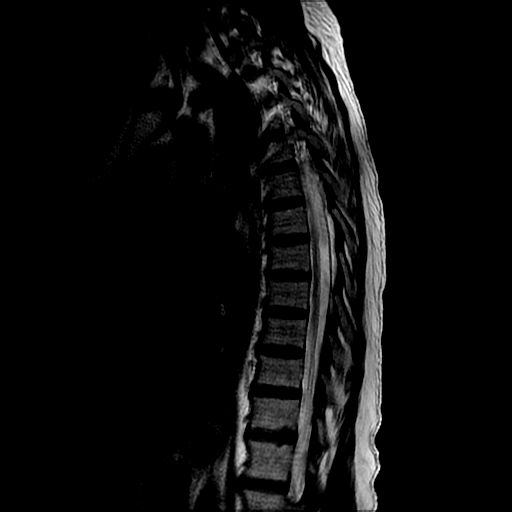
[im 12/12]
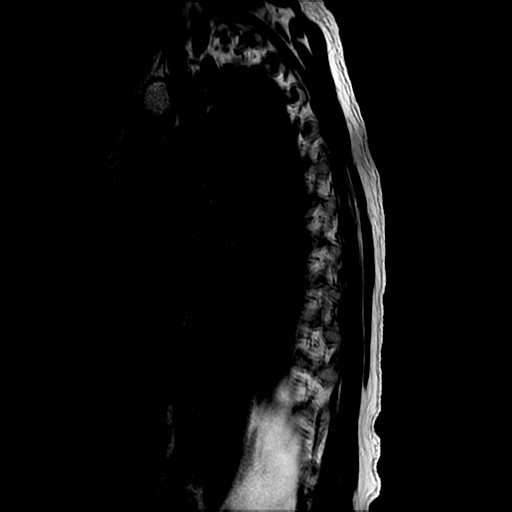

[Series 6: T1 · sagittal · 3.0mm · 0.62mm/px · 3 of 13 slices shown (1 of 2)]
[im 1/13]
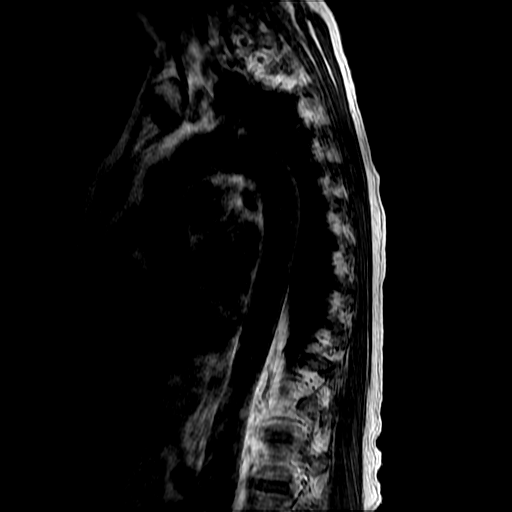
[im 9/13]
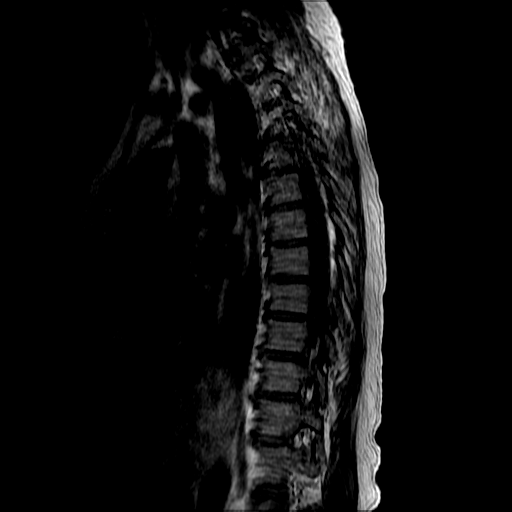
[im 13/13]
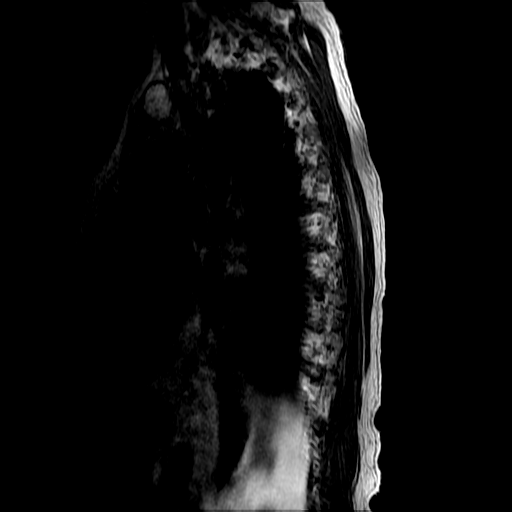

[Series 8: T2 · axial · 4.0mm · 0.43mm/px · z∈[-286,-75]mm · 9 of 39 slices shown (2 of 2)]
[im 1/39]
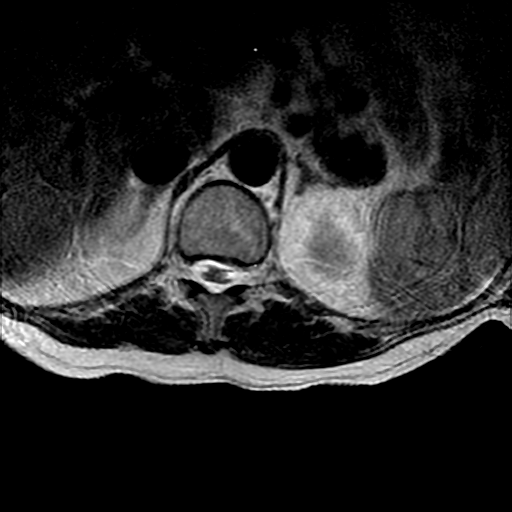
[im 4/39]
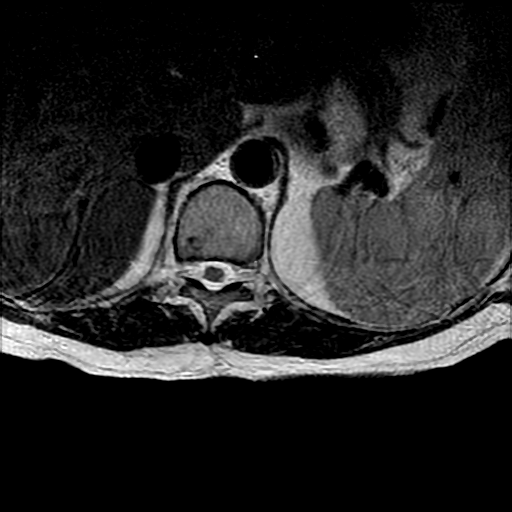
[im 8/39]
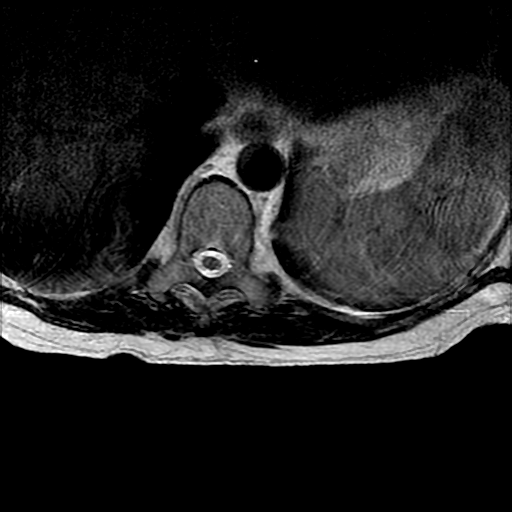
[im 12/39]
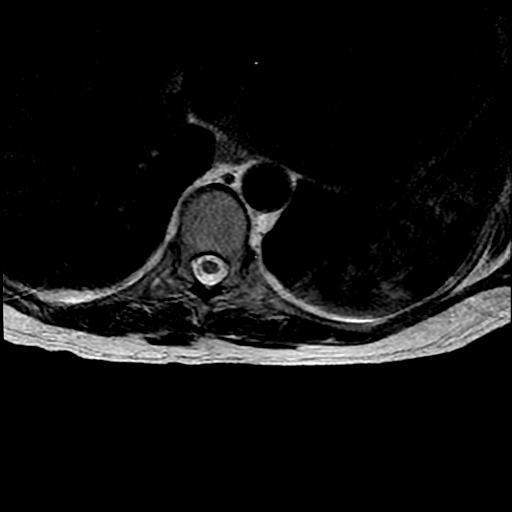
[im 16/39]
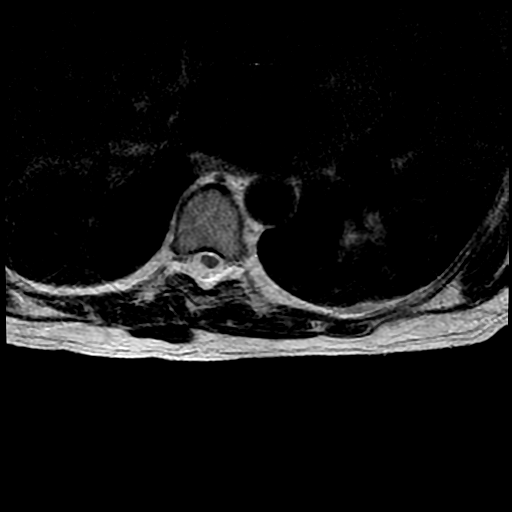
[im 20/39]
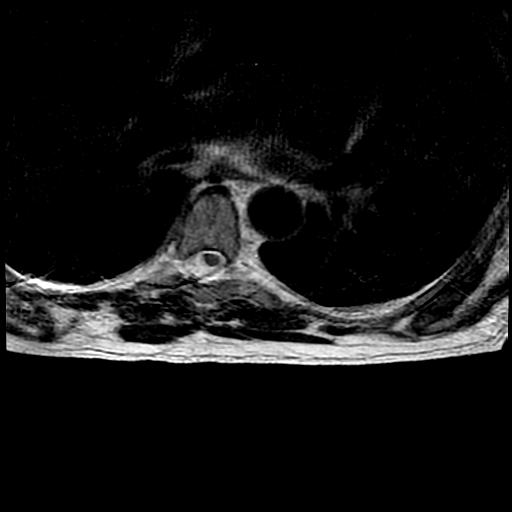
[im 23/39]
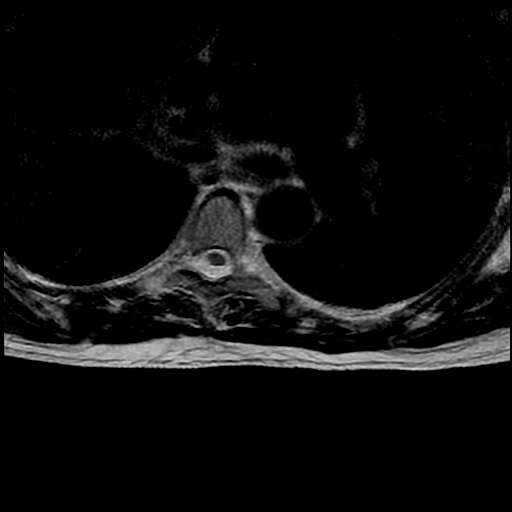
[im 27/39]
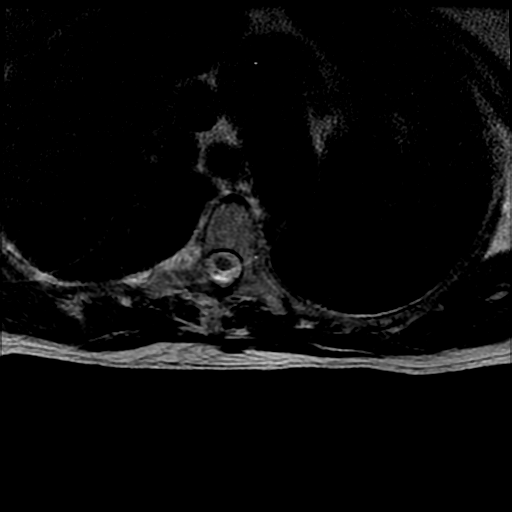
[im 35/39]
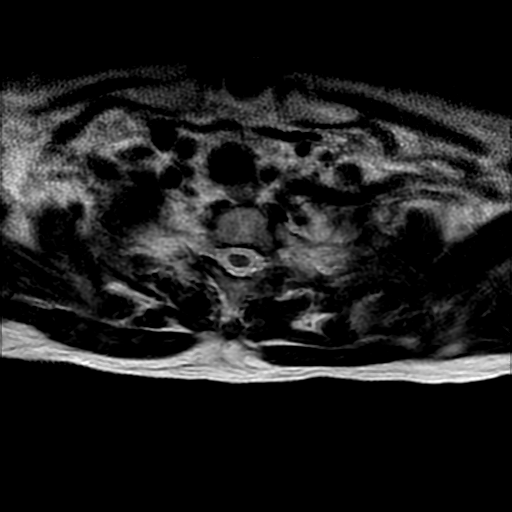

[Series 10: T1 · axial · non-contrast · 4.0mm · 0.39mm/px · z∈[-259,-71]mm · 3 of 39 slices shown (2 of 2)]
[im 4/39]
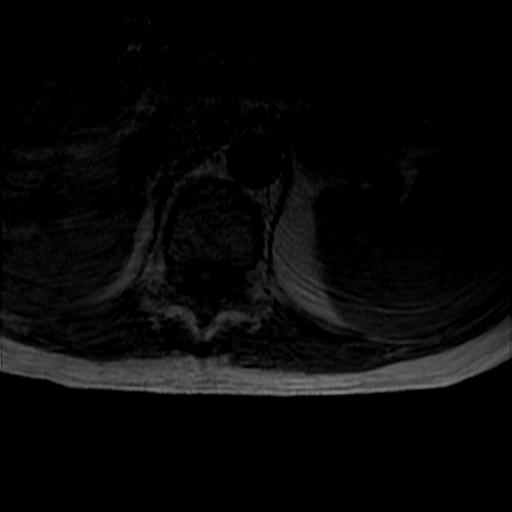
[im 20/39]
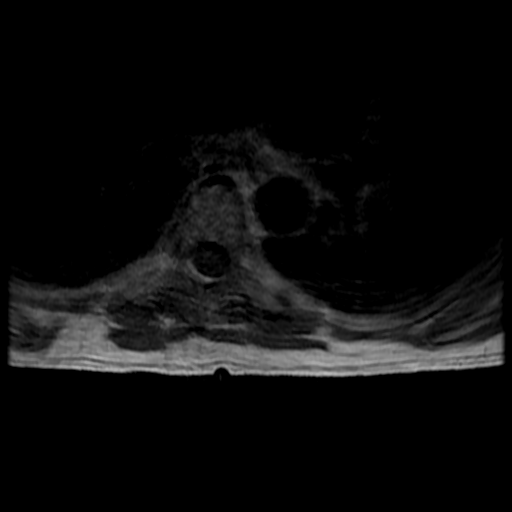
[im 35/39]
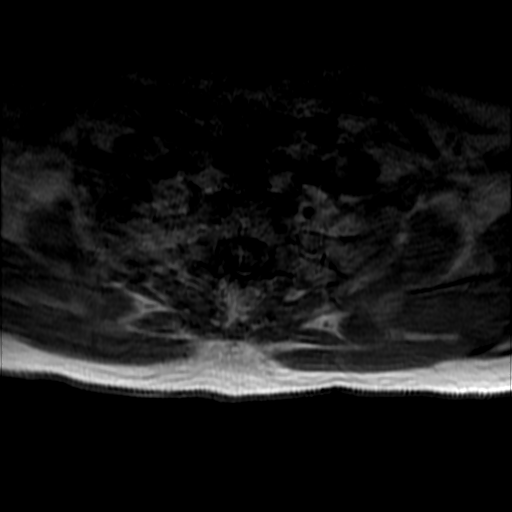

[19 of 48 positions shown; findings below may reference images not displayed]

FINDINGS: Minimal curvature in the thoracic region without significant
malalignment. No evidence of disc bulge or herniation in the
thoracic region. There is some calcification within the T9-10 disc,
not significant. The spinal canal is widely patent. Ample
subarachnoid space surrounds the thoracic spinal cord. No abnormal
cord signal. The T12-L1 disc shows a shallow protrusion more
prominent towards the left but this does not appear to affect the
neural structures. No focal osseous lesion.
IMPRESSION: No significant finding in the thoracic region. No abnormality seen
to explain lower extremity weakness.

## 2018-01-06 IMAGING — CR DG CHEST 2V
2 series · 2 of 2 positions shown · non-contrast
Comparison: Radiograph dated 04/10/2015

CLINICAL DATA: 69-year-old female with nausea vomiting and leg
swelling.

EXAM:
CHEST  2 VIEW

[chest lat]
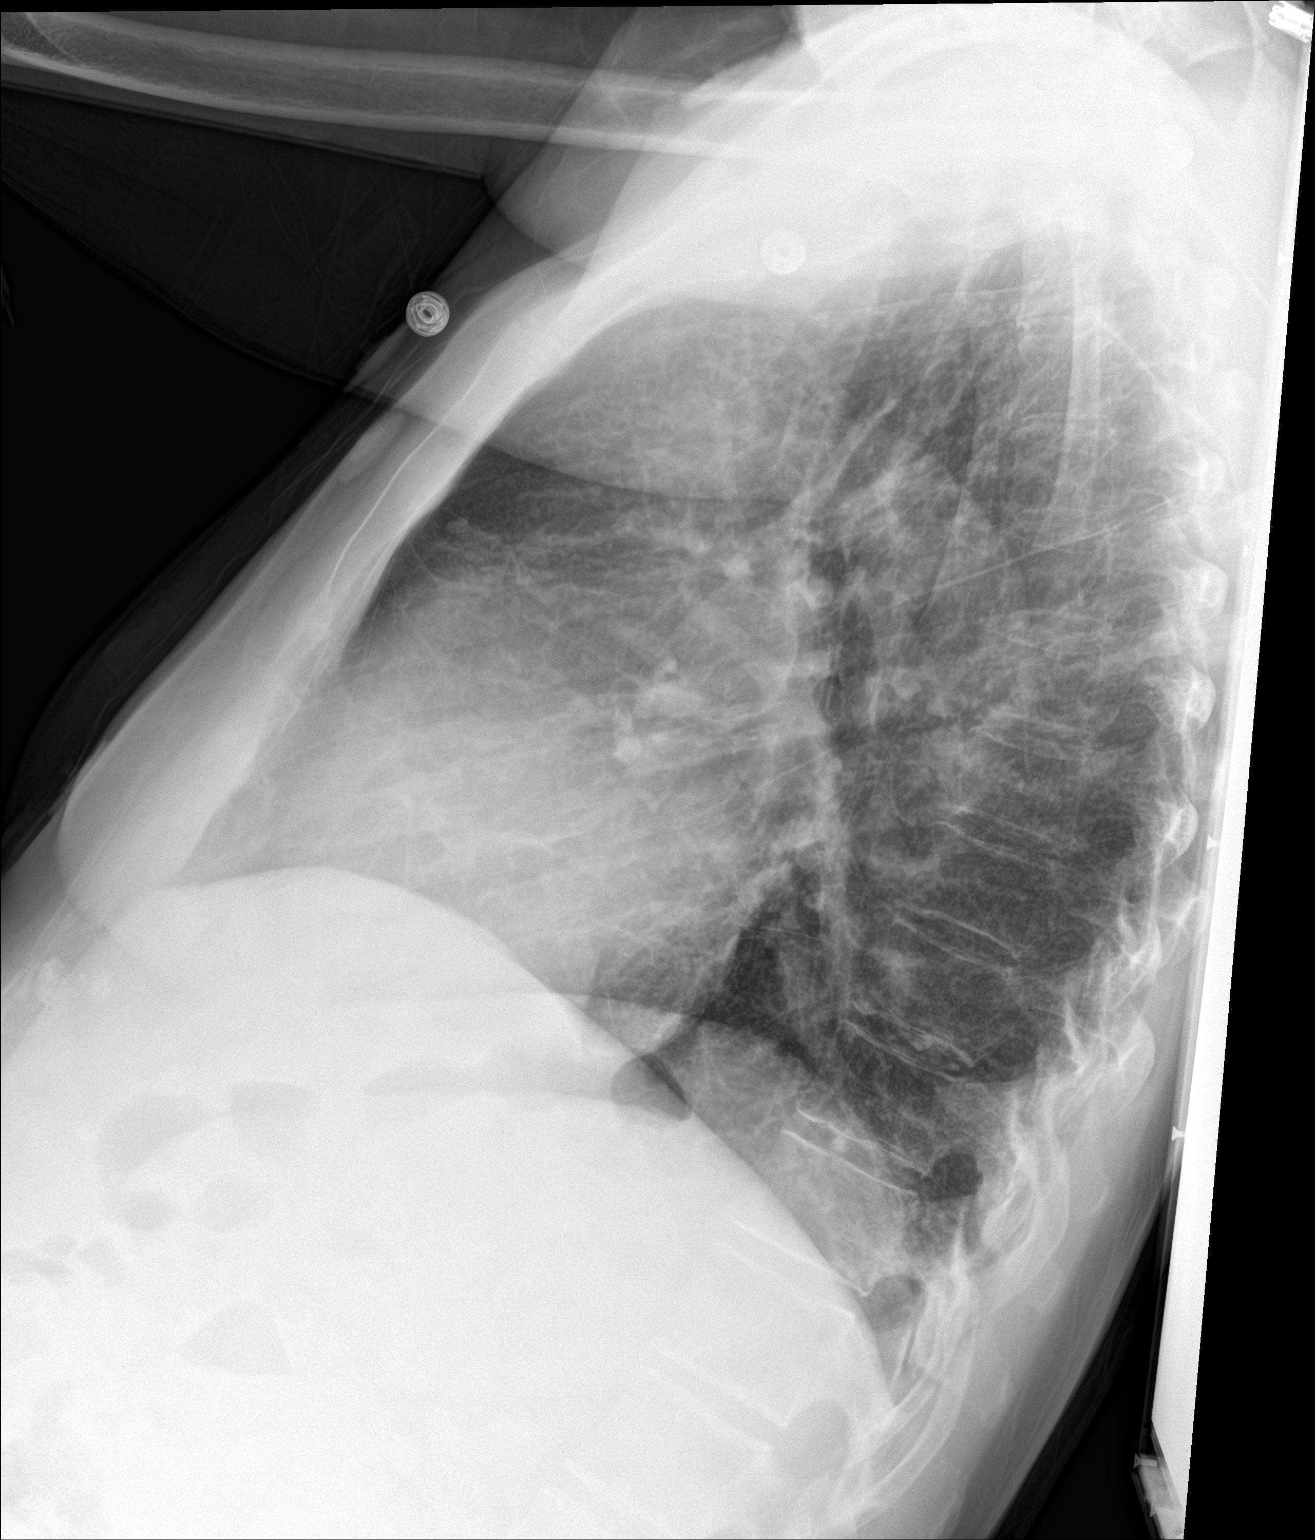

[chest ap]
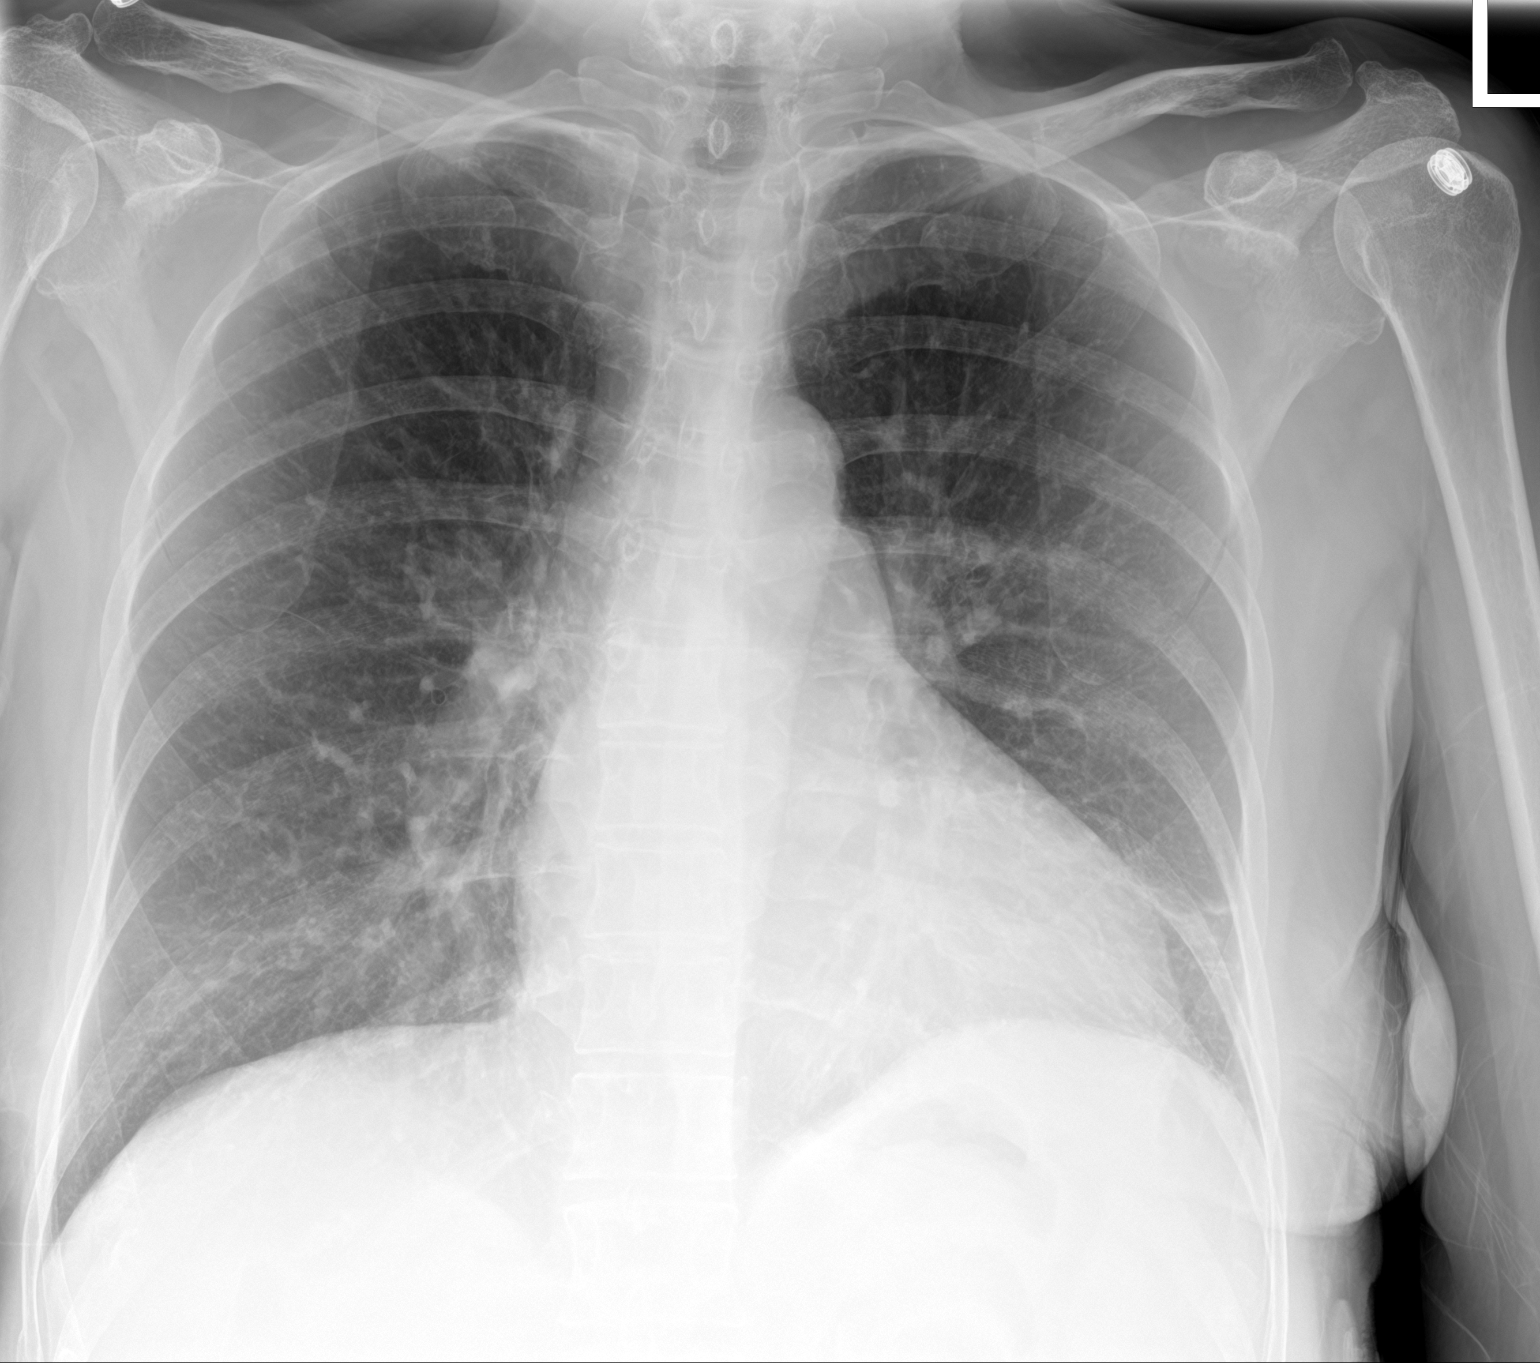

[2 of 2 positions shown; findings below may reference images not displayed]

FINDINGS: Two views of the chest do not demonstrate a focal consolidation.
There is no pleural effusion or pneumothorax. There is mild diffuse
interstitial and vascular prominence likely mild congestive changes.
Mild cardiomegaly. No acute osseous pathology.
IMPRESSION: Probable mild congestive changes.  No focal consolidation.

## 2020-03-17 IMAGING — XA IR FLUORO GUIDE CV LINE*R*
1 series · 1 of 1 positions shown · non-contrast
Comparison: None.

INDICATION: 71-year-old with myelodysplastic syndrome and lymphoplasmacytic
lymphoma.

EXAM:
FLUOROSCOPIC AND ULTRASOUND GUIDED PLACEMENT OF A SUBCUTANEOUS PORT

[Series 300: line placements · 1 of 1 slices shown]
[im 1/1]
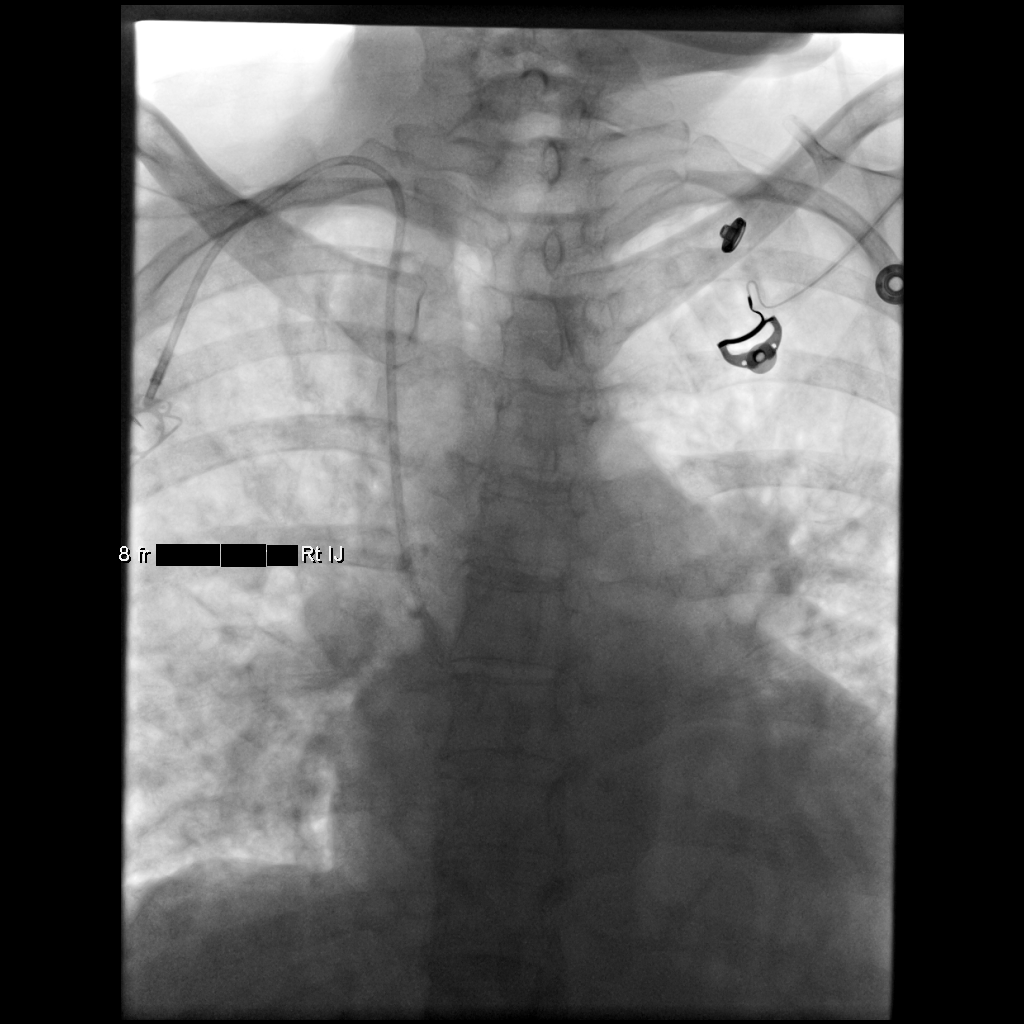

[1 of 1 positions shown; findings below may reference images not displayed]

MEDICATIONS:
Cleocin 600 mg; The antibiotic was administered within an
appropriate time interval prior to skin puncture.

ANESTHESIA/SEDATION:
Versed 1.0 mg IV; Fentanyl 75 mcg IV;

Moderate Sedation Time:  44 minutes

The patient was continuously monitored during the procedure by the
interventional radiology nurse under my direct supervision.

FLUOROSCOPY TIME:  1 minutes and 30 seconds, 7 mGy

COMPLICATIONS:
None immediate.

PROCEDURE:
The procedure, risks, benefits, and alternatives were explained to
the patient. Questions regarding the procedure were encouraged and
answered. The patient understands and consents to the procedure.

Patient was placed supine on the interventional table. Ultrasound
confirmed a patent right internal jugular vein. The right chest and
neck were cleaned with a skin antiseptic and a sterile drape was
placed. Maximal barrier sterile technique was utilized including
caps, mask, sterile gowns, sterile gloves, sterile drape, hand
hygiene and skin antiseptic. The right neck was anesthetized with 1%
lidocaine. Small incision was made in the right neck with a blade.
Micropuncture set was placed in the right internal jugular vein with
ultrasound guidance. The micropuncture wire was used for measurement
purposes. The right chest was anesthetized with 1% lidocaine with
epinephrine. #15 blade was used to make an incision and a
subcutaneous port pocket was formed. 8 french Power Port was
assembled. Subcutaneous tunnel was formed with a stiff tunneling
device. The port catheter was brought through the subcutaneous
tunnel. The port was placed in the subcutaneous pocket. The
micropuncture set was exchanged for a peel-away sheath. The catheter
was placed through the peel-away sheath and the tip was positioned
near the superior cavoatrial junction. Catheter placement was
confirmed with fluoroscopy. The port was accessed and flushed with
heparinized saline. The port pocket was closed using two layers of
absorbable sutures and Dermabond. The vein skin site was closed
using a single layer of absorbable suture and Dermabond. Sterile
dressings were applied. Patient tolerated the procedure well without
an immediate complication.

Ultrasound demonstrated a patent right internal jugular vein.
Ultrasound was used for vascular access. Ultrasound image was saved
for documentation.

Fluoroscopic images were taken and saved for this procedure.
IMPRESSION: Placement of a subcutaneous port device. This is a CT injectable
port.
